# Patient Record
Sex: Female | Born: 1980 | Hispanic: Yes | Marital: Married | Smoking: Never smoker
Health system: Southern US, Community
[De-identification: ages and names within clinical notes are randomized; demographics above are authoritative.]

## PROBLEM LIST (undated history)

## (undated) ENCOUNTER — Inpatient Hospital Stay (HOSPITAL_COMMUNITY): Payer: Self-pay

## (undated) ENCOUNTER — Emergency Department (HOSPITAL_COMMUNITY): Admission: EM | Payer: Medicaid Other | Source: Home / Self Care

## (undated) DIAGNOSIS — E119 Type 2 diabetes mellitus without complications: Secondary | ICD-10-CM

## (undated) DIAGNOSIS — N200 Calculus of kidney: Secondary | ICD-10-CM

## (undated) DIAGNOSIS — N83209 Unspecified ovarian cyst, unspecified side: Secondary | ICD-10-CM

## (undated) DIAGNOSIS — O139 Gestational [pregnancy-induced] hypertension without significant proteinuria, unspecified trimester: Secondary | ICD-10-CM

## (undated) DIAGNOSIS — F1491 Cocaine use, unspecified, in remission: Secondary | ICD-10-CM

## (undated) DIAGNOSIS — Z87898 Personal history of other specified conditions: Secondary | ICD-10-CM

## (undated) DIAGNOSIS — K219 Gastro-esophageal reflux disease without esophagitis: Secondary | ICD-10-CM

## (undated) HISTORY — PX: APPENDECTOMY: SHX54

## (undated) HISTORY — PX: LITHOTRIPSY: SUR834

## (undated) HISTORY — PX: OTHER SURGICAL HISTORY: SHX169

---

## 2001-05-13 ENCOUNTER — Encounter: Payer: Self-pay | Admitting: *Deleted

## 2001-05-13 ENCOUNTER — Ambulatory Visit (HOSPITAL_COMMUNITY): Admission: RE | Admit: 2001-05-13 | Discharge: 2001-05-13 | Payer: Self-pay | Admitting: *Deleted

## 2001-06-21 ENCOUNTER — Observation Stay (HOSPITAL_COMMUNITY): Admission: AD | Admit: 2001-06-21 | Discharge: 2001-06-22 | Payer: Self-pay | Admitting: *Deleted

## 2001-06-30 ENCOUNTER — Observation Stay (HOSPITAL_COMMUNITY): Admission: RE | Admit: 2001-06-30 | Discharge: 2001-07-01 | Payer: Self-pay | Admitting: *Deleted

## 2001-07-01 ENCOUNTER — Observation Stay (HOSPITAL_COMMUNITY): Admission: AD | Admit: 2001-07-01 | Discharge: 2001-07-02 | Payer: Self-pay | Admitting: *Deleted

## 2001-07-13 ENCOUNTER — Ambulatory Visit (HOSPITAL_COMMUNITY): Admission: AD | Admit: 2001-07-13 | Discharge: 2001-07-13 | Payer: Self-pay | Admitting: Obstetrics and Gynecology

## 2001-07-24 ENCOUNTER — Inpatient Hospital Stay (HOSPITAL_COMMUNITY): Admission: RE | Admit: 2001-07-24 | Discharge: 2001-07-27 | Payer: Self-pay | Admitting: *Deleted

## 2001-08-06 ENCOUNTER — Observation Stay (HOSPITAL_COMMUNITY): Admission: AD | Admit: 2001-08-06 | Discharge: 2001-08-07 | Payer: Self-pay | Admitting: *Deleted

## 2001-08-13 ENCOUNTER — Ambulatory Visit (HOSPITAL_COMMUNITY): Admission: AD | Admit: 2001-08-13 | Discharge: 2001-08-13 | Payer: Self-pay | Admitting: Cardiology

## 2001-08-24 ENCOUNTER — Ambulatory Visit (HOSPITAL_COMMUNITY): Admission: RE | Admit: 2001-08-24 | Discharge: 2001-08-24 | Payer: Self-pay | Admitting: *Deleted

## 2001-08-24 ENCOUNTER — Observation Stay (HOSPITAL_COMMUNITY): Admission: AD | Admit: 2001-08-24 | Discharge: 2001-08-25 | Payer: Self-pay | Admitting: *Deleted

## 2001-08-26 ENCOUNTER — Observation Stay (HOSPITAL_COMMUNITY): Admission: AD | Admit: 2001-08-26 | Discharge: 2001-08-27 | Payer: Self-pay | Admitting: *Deleted

## 2001-08-31 ENCOUNTER — Ambulatory Visit (HOSPITAL_COMMUNITY): Admission: RE | Admit: 2001-08-31 | Discharge: 2001-08-31 | Payer: Self-pay | Admitting: *Deleted

## 2001-09-01 ENCOUNTER — Inpatient Hospital Stay (HOSPITAL_COMMUNITY): Admission: AD | Admit: 2001-09-01 | Discharge: 2001-09-04 | Payer: Self-pay | Admitting: *Deleted

## 2003-05-03 ENCOUNTER — Emergency Department (HOSPITAL_COMMUNITY): Admission: EM | Admit: 2003-05-03 | Discharge: 2003-05-03 | Payer: Self-pay | Admitting: *Deleted

## 2003-05-03 ENCOUNTER — Encounter: Payer: Self-pay | Admitting: *Deleted

## 2003-05-05 ENCOUNTER — Observation Stay (HOSPITAL_COMMUNITY): Admission: RE | Admit: 2003-05-05 | Discharge: 2003-05-06 | Payer: Self-pay | Admitting: Obstetrics & Gynecology

## 2004-08-21 ENCOUNTER — Emergency Department (HOSPITAL_COMMUNITY): Admission: EM | Admit: 2004-08-21 | Discharge: 2004-08-21 | Payer: Self-pay | Admitting: Emergency Medicine

## 2004-09-10 ENCOUNTER — Emergency Department (HOSPITAL_COMMUNITY): Admission: EM | Admit: 2004-09-10 | Discharge: 2004-09-11 | Payer: Self-pay | Admitting: Emergency Medicine

## 2005-01-28 ENCOUNTER — Emergency Department (HOSPITAL_COMMUNITY): Admission: EM | Admit: 2005-01-28 | Discharge: 2005-01-28 | Payer: Self-pay | Admitting: Emergency Medicine

## 2005-05-21 ENCOUNTER — Emergency Department (HOSPITAL_COMMUNITY): Admission: EM | Admit: 2005-05-21 | Discharge: 2005-05-21 | Payer: Self-pay | Admitting: Emergency Medicine

## 2005-07-08 ENCOUNTER — Emergency Department (HOSPITAL_COMMUNITY): Admission: EM | Admit: 2005-07-08 | Discharge: 2005-07-08 | Payer: Self-pay | Admitting: Emergency Medicine

## 2005-07-09 ENCOUNTER — Inpatient Hospital Stay (HOSPITAL_COMMUNITY): Admission: EM | Admit: 2005-07-09 | Discharge: 2005-07-14 | Payer: Self-pay | Admitting: Emergency Medicine

## 2005-07-20 ENCOUNTER — Inpatient Hospital Stay (HOSPITAL_COMMUNITY): Admission: EM | Admit: 2005-07-20 | Discharge: 2005-07-24 | Payer: Self-pay | Admitting: Emergency Medicine

## 2005-07-29 ENCOUNTER — Observation Stay (HOSPITAL_COMMUNITY): Admission: EM | Admit: 2005-07-29 | Discharge: 2005-07-30 | Payer: Self-pay | Admitting: *Deleted

## 2005-08-06 ENCOUNTER — Emergency Department (HOSPITAL_COMMUNITY): Admission: EM | Admit: 2005-08-06 | Discharge: 2005-08-06 | Payer: Self-pay | Admitting: *Deleted

## 2005-08-16 ENCOUNTER — Ambulatory Visit (HOSPITAL_COMMUNITY): Admission: RE | Admit: 2005-08-16 | Discharge: 2005-08-16 | Payer: Self-pay | Admitting: Obstetrics and Gynecology

## 2005-09-08 ENCOUNTER — Observation Stay (HOSPITAL_COMMUNITY): Admission: AD | Admit: 2005-09-08 | Discharge: 2005-09-09 | Payer: Self-pay | Admitting: Obstetrics and Gynecology

## 2005-09-16 ENCOUNTER — Observation Stay (HOSPITAL_COMMUNITY): Admission: AD | Admit: 2005-09-16 | Discharge: 2005-09-17 | Payer: Self-pay | Admitting: Obstetrics and Gynecology

## 2005-09-28 ENCOUNTER — Ambulatory Visit (HOSPITAL_COMMUNITY): Admission: AD | Admit: 2005-09-28 | Discharge: 2005-09-28 | Payer: Self-pay | Admitting: Obstetrics and Gynecology

## 2005-10-14 ENCOUNTER — Observation Stay (HOSPITAL_COMMUNITY): Admission: AD | Admit: 2005-10-14 | Discharge: 2005-10-14 | Payer: Self-pay | Admitting: Obstetrics and Gynecology

## 2005-10-20 ENCOUNTER — Observation Stay (HOSPITAL_COMMUNITY): Admission: AD | Admit: 2005-10-20 | Discharge: 2005-10-22 | Payer: Self-pay | Admitting: Obstetrics and Gynecology

## 2005-10-23 ENCOUNTER — Inpatient Hospital Stay (HOSPITAL_COMMUNITY): Admission: RE | Admit: 2005-10-23 | Discharge: 2005-10-26 | Payer: Self-pay | Admitting: Obstetrics and Gynecology

## 2005-10-30 ENCOUNTER — Observation Stay (HOSPITAL_COMMUNITY): Admission: AD | Admit: 2005-10-30 | Discharge: 2005-10-31 | Payer: Self-pay | Admitting: Obstetrics and Gynecology

## 2005-10-31 ENCOUNTER — Ambulatory Visit (HOSPITAL_COMMUNITY): Admission: AD | Admit: 2005-10-31 | Discharge: 2005-10-31 | Payer: Self-pay | Admitting: Obstetrics and Gynecology

## 2005-11-05 ENCOUNTER — Ambulatory Visit (HOSPITAL_COMMUNITY): Admission: AD | Admit: 2005-11-05 | Discharge: 2005-11-05 | Payer: Self-pay | Admitting: Obstetrics and Gynecology

## 2005-11-12 ENCOUNTER — Encounter (HOSPITAL_COMMUNITY): Admission: RE | Admit: 2005-11-12 | Discharge: 2005-12-12 | Payer: Self-pay | Admitting: Obstetrics and Gynecology

## 2005-11-12 ENCOUNTER — Ambulatory Visit (HOSPITAL_COMMUNITY): Payer: Self-pay | Admitting: Obstetrics and Gynecology

## 2005-11-18 ENCOUNTER — Observation Stay (HOSPITAL_COMMUNITY): Admission: AD | Admit: 2005-11-18 | Discharge: 2005-11-19 | Payer: Self-pay | Admitting: Obstetrics and Gynecology

## 2005-12-04 ENCOUNTER — Observation Stay (HOSPITAL_COMMUNITY): Admission: AD | Admit: 2005-12-04 | Discharge: 2005-12-05 | Payer: Self-pay | Admitting: Obstetrics and Gynecology

## 2005-12-19 ENCOUNTER — Inpatient Hospital Stay (HOSPITAL_COMMUNITY): Admission: AD | Admit: 2005-12-19 | Discharge: 2005-12-20 | Payer: Self-pay | Admitting: Obstetrics and Gynecology

## 2005-12-20 ENCOUNTER — Observation Stay (HOSPITAL_COMMUNITY): Admission: RE | Admit: 2005-12-20 | Discharge: 2005-12-21 | Payer: Self-pay | Admitting: Obstetrics and Gynecology

## 2005-12-24 ENCOUNTER — Observation Stay (HOSPITAL_COMMUNITY): Admission: RE | Admit: 2005-12-24 | Discharge: 2005-12-24 | Payer: Self-pay | Admitting: Obstetrics and Gynecology

## 2005-12-26 ENCOUNTER — Ambulatory Visit (HOSPITAL_COMMUNITY): Admission: AD | Admit: 2005-12-26 | Discharge: 2005-12-26 | Payer: Self-pay | Admitting: Obstetrics and Gynecology

## 2006-01-02 ENCOUNTER — Ambulatory Visit (HOSPITAL_COMMUNITY): Admission: AD | Admit: 2006-01-02 | Discharge: 2006-01-02 | Payer: Self-pay | Admitting: Obstetrics and Gynecology

## 2006-01-10 ENCOUNTER — Inpatient Hospital Stay (HOSPITAL_COMMUNITY): Admission: RE | Admit: 2006-01-10 | Discharge: 2006-01-11 | Payer: Self-pay | Admitting: Obstetrics and Gynecology

## 2006-04-07 ENCOUNTER — Emergency Department (HOSPITAL_COMMUNITY): Admission: EM | Admit: 2006-04-07 | Discharge: 2006-04-07 | Payer: Self-pay | Admitting: Emergency Medicine

## 2006-06-28 ENCOUNTER — Emergency Department (HOSPITAL_COMMUNITY): Admission: EM | Admit: 2006-06-28 | Discharge: 2006-06-28 | Payer: Self-pay | Admitting: Emergency Medicine

## 2006-07-08 ENCOUNTER — Emergency Department (HOSPITAL_COMMUNITY): Admission: EM | Admit: 2006-07-08 | Discharge: 2006-07-08 | Payer: Self-pay | Admitting: Emergency Medicine

## 2006-09-30 ENCOUNTER — Emergency Department (HOSPITAL_COMMUNITY): Admission: EM | Admit: 2006-09-30 | Discharge: 2006-09-30 | Payer: Self-pay | Admitting: Emergency Medicine

## 2006-10-06 ENCOUNTER — Emergency Department (HOSPITAL_COMMUNITY): Admission: EM | Admit: 2006-10-06 | Discharge: 2006-10-07 | Payer: Self-pay | Admitting: Emergency Medicine

## 2006-11-19 ENCOUNTER — Emergency Department (HOSPITAL_COMMUNITY): Admission: EM | Admit: 2006-11-19 | Discharge: 2006-11-19 | Payer: Self-pay | Admitting: Emergency Medicine

## 2006-12-16 ENCOUNTER — Emergency Department (HOSPITAL_COMMUNITY): Admission: EM | Admit: 2006-12-16 | Discharge: 2006-12-16 | Payer: Self-pay | Admitting: Emergency Medicine

## 2007-07-06 ENCOUNTER — Emergency Department (HOSPITAL_COMMUNITY): Admission: EM | Admit: 2007-07-06 | Discharge: 2007-07-07 | Payer: Self-pay | Admitting: Emergency Medicine

## 2007-10-09 ENCOUNTER — Emergency Department (HOSPITAL_COMMUNITY): Admission: EM | Admit: 2007-10-09 | Discharge: 2007-10-10 | Payer: Self-pay | Admitting: Emergency Medicine

## 2007-10-29 ENCOUNTER — Ambulatory Visit (HOSPITAL_COMMUNITY): Admission: RE | Admit: 2007-10-29 | Discharge: 2007-10-29 | Payer: Self-pay | Admitting: Family Medicine

## 2007-11-03 ENCOUNTER — Emergency Department (HOSPITAL_COMMUNITY): Admission: EM | Admit: 2007-11-03 | Discharge: 2007-11-03 | Payer: Self-pay | Admitting: Emergency Medicine

## 2008-03-09 ENCOUNTER — Emergency Department (HOSPITAL_COMMUNITY): Admission: EM | Admit: 2008-03-09 | Discharge: 2008-03-09 | Payer: Self-pay | Admitting: Emergency Medicine

## 2008-05-18 ENCOUNTER — Emergency Department (HOSPITAL_COMMUNITY): Admission: EM | Admit: 2008-05-18 | Discharge: 2008-05-19 | Payer: Self-pay | Admitting: Emergency Medicine

## 2008-07-12 ENCOUNTER — Emergency Department (HOSPITAL_COMMUNITY): Admission: EM | Admit: 2008-07-12 | Discharge: 2008-07-13 | Payer: Self-pay | Admitting: Emergency Medicine

## 2008-11-09 ENCOUNTER — Emergency Department (HOSPITAL_COMMUNITY): Admission: EM | Admit: 2008-11-09 | Discharge: 2008-11-10 | Payer: Self-pay | Admitting: Emergency Medicine

## 2009-06-29 ENCOUNTER — Emergency Department (HOSPITAL_COMMUNITY): Admission: EM | Admit: 2009-06-29 | Discharge: 2009-06-29 | Payer: Self-pay | Admitting: Emergency Medicine

## 2009-08-07 ENCOUNTER — Emergency Department (HOSPITAL_COMMUNITY): Admission: EM | Admit: 2009-08-07 | Discharge: 2009-08-07 | Payer: Self-pay | Admitting: Emergency Medicine

## 2009-10-19 ENCOUNTER — Emergency Department (HOSPITAL_COMMUNITY): Admission: EM | Admit: 2009-10-19 | Discharge: 2009-10-19 | Payer: Self-pay | Admitting: Emergency Medicine

## 2010-03-12 ENCOUNTER — Emergency Department (HOSPITAL_COMMUNITY): Admission: EM | Admit: 2010-03-12 | Discharge: 2010-03-12 | Payer: Self-pay | Admitting: Emergency Medicine

## 2010-03-22 ENCOUNTER — Emergency Department (HOSPITAL_COMMUNITY): Admission: EM | Admit: 2010-03-22 | Discharge: 2010-03-22 | Payer: Self-pay | Admitting: Emergency Medicine

## 2010-06-04 ENCOUNTER — Emergency Department (HOSPITAL_COMMUNITY): Admission: EM | Admit: 2010-06-04 | Discharge: 2010-06-04 | Payer: Self-pay | Admitting: Emergency Medicine

## 2010-10-04 ENCOUNTER — Emergency Department (HOSPITAL_COMMUNITY): Admission: EM | Admit: 2010-10-04 | Discharge: 2010-10-04 | Payer: Self-pay | Admitting: Emergency Medicine

## 2010-10-25 ENCOUNTER — Emergency Department (HOSPITAL_COMMUNITY): Admission: EM | Admit: 2010-10-25 | Discharge: 2010-10-26 | Payer: Self-pay | Admitting: Emergency Medicine

## 2011-01-08 ENCOUNTER — Emergency Department (HOSPITAL_COMMUNITY)
Admission: EM | Admit: 2011-01-08 | Discharge: 2011-01-09 | Payer: Self-pay | Source: Home / Self Care | Admitting: Emergency Medicine

## 2011-01-09 LAB — URINALYSIS, ROUTINE W REFLEX MICROSCOPIC
Bilirubin Urine: NEGATIVE
Ketones, ur: NEGATIVE mg/dL
Leukocytes, UA: NEGATIVE
Nitrite: NEGATIVE
Protein, ur: NEGATIVE mg/dL
Specific Gravity, Urine: 1.025 (ref 1.005–1.030)
Urine Glucose, Fasting: NEGATIVE mg/dL
Urobilinogen, UA: 0.2 mg/dL (ref 0.0–1.0)
pH: 6 (ref 5.0–8.0)

## 2011-01-09 LAB — DIFFERENTIAL
Basophils Absolute: 0 10*3/uL (ref 0.0–0.1)
Basophils Relative: 0 % (ref 0–1)
Eosinophils Absolute: 0.1 10*3/uL (ref 0.0–0.7)
Eosinophils Relative: 2 % (ref 0–5)
Lymphocytes Relative: 32 % (ref 12–46)
Lymphs Abs: 2.4 10*3/uL (ref 0.7–4.0)
Monocytes Absolute: 0.6 10*3/uL (ref 0.1–1.0)
Monocytes Relative: 9 % (ref 3–12)
Neutro Abs: 4.2 10*3/uL (ref 1.7–7.7)
Neutrophils Relative %: 57 % (ref 43–77)

## 2011-01-09 LAB — CBC
HCT: 33.9 % — ABNORMAL LOW (ref 36.0–46.0)
Hemoglobin: 11.6 g/dL — ABNORMAL LOW (ref 12.0–15.0)
MCH: 28.9 pg (ref 26.0–34.0)
MCHC: 34.2 g/dL (ref 30.0–36.0)
MCV: 84.5 fL (ref 78.0–100.0)
Platelets: 251 10*3/uL (ref 150–400)
RBC: 4.01 MIL/uL (ref 3.87–5.11)
RDW: 12.6 % (ref 11.5–15.5)
WBC: 7.4 10*3/uL (ref 4.0–10.5)

## 2011-01-09 LAB — BASIC METABOLIC PANEL
BUN: 6 mg/dL (ref 6–23)
CO2: 26 mEq/L (ref 19–32)
Calcium: 8.8 mg/dL (ref 8.4–10.5)
Chloride: 106 mEq/L (ref 96–112)
Creatinine, Ser: 0.66 mg/dL (ref 0.4–1.2)
GFR calc Af Amer: >60 mL/min (ref >60)
GFR calc non Af Amer: >60 mL/min (ref >60)
Glucose, Bld: 96 mg/dL (ref 70–99)
Potassium: 4 mEq/L (ref 3.5–5.1)
Sodium: 138 mEq/L (ref 135–145)

## 2011-01-09 LAB — URINE MICROSCOPIC-ADD ON

## 2011-01-09 LAB — PREGNANCY, URINE: Preg Test, Ur: NEGATIVE

## 2011-01-13 ENCOUNTER — Encounter: Payer: Self-pay | Admitting: Family Medicine

## 2011-03-05 LAB — CBC
HCT: 36 % (ref 36.0–46.0)
MCHC: 33.9 g/dL (ref 30.0–36.0)
RDW: 13.7 % (ref 11.5–15.5)
WBC: 8.5 10*3/uL (ref 4.0–10.5)

## 2011-03-05 LAB — URINE MICROSCOPIC-ADD ON

## 2011-03-05 LAB — URINALYSIS, ROUTINE W REFLEX MICROSCOPIC
Glucose, UA: NEGATIVE mg/dL
Ketones, ur: NEGATIVE mg/dL
Protein, ur: NEGATIVE mg/dL
pH: 6.5 (ref 5.0–8.0)

## 2011-03-05 LAB — DIFFERENTIAL
Basophils Absolute: 0 10*3/uL (ref 0.0–0.1)
Basophils Relative: 0 % (ref 0–1)
Lymphocytes Relative: 29 % (ref 12–46)
Monocytes Absolute: 0.5 10*3/uL (ref 0.1–1.0)
Neutro Abs: 5.4 10*3/uL (ref 1.7–7.7)
Neutrophils Relative %: 64 % (ref 43–77)

## 2011-03-05 LAB — POCT PREGNANCY, URINE: Preg Test, Ur: NEGATIVE

## 2011-03-05 LAB — BASIC METABOLIC PANEL
BUN: 7 mg/dL (ref 6–23)
Calcium: 9.2 mg/dL (ref 8.4–10.5)
GFR calc non Af Amer: >60 mL/min (ref >60)
Glucose, Bld: 118 mg/dL — ABNORMAL HIGH (ref 70–99)
Potassium: 3.7 mEq/L (ref 3.5–5.1)
Sodium: 136 mEq/L (ref 135–145)

## 2011-03-06 LAB — CBC
HCT: 34.8 % — ABNORMAL LOW (ref 36.0–46.0)
Hemoglobin: 11.9 g/dL — ABNORMAL LOW (ref 12.0–15.0)
MCHC: 34.2 g/dL (ref 30.0–36.0)
RBC: 4 MIL/uL (ref 3.87–5.11)
WBC: 5.8 10*3/uL (ref 4.0–10.5)

## 2011-03-06 LAB — DIFFERENTIAL
Basophils Relative: 0 % (ref 0–1)
Lymphocytes Relative: 34 % (ref 12–46)
Lymphs Abs: 2 10*3/uL (ref 0.7–4.0)
Monocytes Absolute: 0.5 10*3/uL (ref 0.1–1.0)
Monocytes Relative: 8 % (ref 3–12)
Neutro Abs: 3.2 10*3/uL (ref 1.7–7.7)
Neutrophils Relative %: 56 % (ref 43–77)

## 2011-03-06 LAB — BASIC METABOLIC PANEL
Calcium: 9 mg/dL (ref 8.4–10.5)
GFR calc Af Amer: >60 mL/min (ref >60)
GFR calc non Af Amer: >60 mL/min (ref >60)
Potassium: 3.8 mEq/L (ref 3.5–5.1)
Sodium: 137 mEq/L (ref 135–145)

## 2011-03-06 LAB — POCT CARDIAC MARKERS
CKMB, poc: <1 ng/mL — ABNORMAL LOW (ref 1.0–8.0)
Myoglobin, poc: 25.5 ng/mL (ref 12–200)
Troponin i, poc: <0.05 ng/mL (ref 0.00–0.09)

## 2011-03-06 LAB — POCT PREGNANCY, URINE: Preg Test, Ur: NEGATIVE

## 2011-03-15 LAB — URINE MICROSCOPIC-ADD ON

## 2011-03-15 LAB — URINALYSIS, ROUTINE W REFLEX MICROSCOPIC
Bilirubin Urine: NEGATIVE
Ketones, ur: NEGATIVE mg/dL
Leukocytes, UA: NEGATIVE
Nitrite: NEGATIVE
Protein, ur: NEGATIVE mg/dL
Protein, ur: NEGATIVE mg/dL
Specific Gravity, Urine: 1.02 (ref 1.005–1.030)
Urobilinogen, UA: 0.2 mg/dL (ref 0.0–1.0)
pH: 7 (ref 5.0–8.0)

## 2011-03-15 LAB — GC/CHLAMYDIA PROBE AMP, GENITAL
Chlamydia, DNA Probe: NEGATIVE
GC Probe Amp, Genital: NEGATIVE

## 2011-03-15 LAB — PREGNANCY, URINE: Preg Test, Ur: NEGATIVE

## 2011-03-15 LAB — HCG, QUANTITATIVE, PREGNANCY: hCG, Beta Chain, Quant, S: <2 m[IU]/mL (ref <5)

## 2011-03-15 LAB — WET PREP, GENITAL

## 2011-03-28 LAB — URINALYSIS, ROUTINE W REFLEX MICROSCOPIC
Bilirubin Urine: NEGATIVE
Nitrite: NEGATIVE
Specific Gravity, Urine: 1.015 (ref 1.005–1.030)
Urobilinogen, UA: 0.2 mg/dL (ref 0.0–1.0)

## 2011-03-28 LAB — URINE CULTURE

## 2011-03-28 LAB — PREGNANCY, URINE: Preg Test, Ur: NEGATIVE

## 2011-03-28 LAB — URINE MICROSCOPIC-ADD ON

## 2011-03-30 LAB — DIFFERENTIAL
Basophils Absolute: 0 10*3/uL (ref 0.0–0.1)
Basophils Relative: 0 % (ref 0–1)
Eosinophils Relative: 2 % (ref 0–5)
Lymphocytes Relative: 25 % (ref 12–46)
Neutro Abs: 3.9 10*3/uL (ref 1.7–7.7)

## 2011-03-30 LAB — URINALYSIS, ROUTINE W REFLEX MICROSCOPIC
Bilirubin Urine: NEGATIVE
Protein, ur: NEGATIVE mg/dL
Urobilinogen, UA: 0.2 mg/dL (ref 0.0–1.0)

## 2011-03-30 LAB — COMPREHENSIVE METABOLIC PANEL
AST: 31 U/L (ref 0–37)
BUN: 5 mg/dL — ABNORMAL LOW (ref 6–23)
CO2: 27 mEq/L (ref 19–32)
Chloride: 105 mEq/L (ref 96–112)
Creatinine, Ser: 0.61 mg/dL (ref 0.4–1.2)
GFR calc non Af Amer: >60 mL/min (ref >60)
Total Bilirubin: 0.3 mg/dL (ref 0.3–1.2)

## 2011-03-30 LAB — CBC
HCT: 33.7 % — ABNORMAL LOW (ref 36.0–46.0)
MCV: 90.3 fL (ref 78.0–100.0)
RBC: 3.73 MIL/uL — ABNORMAL LOW (ref 3.87–5.11)
WBC: 6 10*3/uL (ref 4.0–10.5)

## 2011-03-30 LAB — LIPASE, BLOOD: Lipase: 28 U/L (ref 11–59)

## 2011-03-31 LAB — BASIC METABOLIC PANEL
CO2: 27 mEq/L (ref 19–32)
Chloride: 99 mEq/L (ref 96–112)
Creatinine, Ser: 0.73 mg/dL (ref 0.4–1.2)
GFR calc Af Amer: >60 mL/min (ref >60)
Potassium: 3.5 mEq/L (ref 3.5–5.1)
Sodium: 133 mEq/L — ABNORMAL LOW (ref 135–145)

## 2011-03-31 LAB — DIFFERENTIAL
Basophils Relative: 0 % (ref 0–1)
Eosinophils Absolute: 0.1 10*3/uL (ref 0.0–0.7)
Eosinophils Relative: 2 % (ref 0–5)
Lymphs Abs: 1.1 10*3/uL (ref 0.7–4.0)
Monocytes Absolute: 0.4 10*3/uL (ref 0.1–1.0)
Monocytes Relative: 5 % (ref 3–12)

## 2011-03-31 LAB — CBC
HCT: 34.7 % — ABNORMAL LOW (ref 36.0–46.0)
Hemoglobin: 12.5 g/dL (ref 12.0–15.0)
MCHC: 36 g/dL (ref 30.0–36.0)
MCV: 90 fL (ref 78.0–100.0)
RBC: 3.86 MIL/uL — ABNORMAL LOW (ref 3.87–5.11)
WBC: 7.4 10*3/uL (ref 4.0–10.5)

## 2011-03-31 LAB — URINALYSIS, ROUTINE W REFLEX MICROSCOPIC
Ketones, ur: NEGATIVE mg/dL
Protein, ur: NEGATIVE mg/dL
Urobilinogen, UA: 0.2 mg/dL (ref 0.0–1.0)

## 2011-03-31 LAB — URINE MICROSCOPIC-ADD ON

## 2011-03-31 LAB — URINE CULTURE

## 2011-05-10 NOTE — Op Note (Signed)
NAME:  [name], [name]             ACCOUNT NO.:  [id number]   MEDICAL RECORD NO.:  [id number]          PATIENT TYPE:  INP   LOCATION:  A418                          FACILITY:  [facility]   PHYSICIAN:  Ky Barban, M.D.DATE OF BIRTH:  [birth year]   DATE OF PROCEDURE:  07/12/2005  DATE OF DISCHARGE:                                 OPERATIVE REPORT   PREOPERATIVE DIAGNOSES:  1.  Left hydronephrosis.  2.  Left flank pain.  3.  Pregnancy.   POSTOPERATIVE DIAGNOSES:  1.  Left hydronephrosis.  2.  Left flank pain.  3.  Pregnancy.   PROCEDURE:  Cystoscopy, left double-J stent insertion under fluoroscopic  control.   ANESTHESIA:  Spinal.   FINDINGS:  The hydronephrosis on the left side is probably only grade 1.  It  was worse on previous ultrasound.  She is still having pain but I doubt if  the pain is coming from the kidney unless she passed a stone.  I went ahead  and inserted a double-J stent.   PROCEDURE IN DETAIL:  The patient had spinal anesthesia, placed in lithotomy  position, and the abdomen was prepped and draped.  A #25 cystoscope was  introduced into the bladder.  The left ureteral orifice was catheterized  with an open-ended catheter and through that, a guide wire was passed up  into the renal pelvis and it could be seen inside the renal pelvis on the  ultrasound.  Then, over the guide wire, I advanced the open-ended catheter  into the renal pelvis and guide wire was removed.  Hydronephrotic grip was  obtained.  The guide wire was re-inserted.  Open-ended catheter was removed  over the guide wire.  A 5-French, 24 cm double-J stent is positioned and the  upper end of the stent can be seen with the ultrasound.  The lower end is  visible as a loop in the bladder.  All the instruments are removed.  The  patient left the operating room in satisfactory condition.       MIJ/MEDQ  D:  07/12/2005  T:  07/12/2005  Job:  [id number]

## 2011-05-10 NOTE — Discharge Summary (Signed)
NAME:  AURIE, HARROUN             ACCOUNT NO.:  [id number]   MEDICAL RECORD NO.:  [id number]          PATIENT TYPE:  INP   LOCATION:  LDR2                          FACILITY:  [facility]   PHYSICIAN:  Lazaro Arms, M.D.   DATE OF BIRTH:  September 14, 1981   DATE OF ADMISSION:  12/19/2005  DATE OF DISCHARGE:  12/29/2006LH                                 DISCHARGE SUMMARY   Tamsen had an uneventful night. She is still showing contractions,  irregularly, sometimes frequently on the ESM, which is no change for the  past 3 months.  Fetal heart rate looks fine. There is no change in her  cervix in over 24 hours it is about 4 cm thick, posterior, minus 1 station.   In lieu of the fact that she has made no cervical change in over 24 hours,  really about 30 hours, we are going to send her home with instructions to  come back if her contractions change in character, or ruptured membranes, or  anything else that gives her concern.      Jacklyn Shell, C.N.M.      Lazaro Arms, M.D.  Electronically Signed    FC/MEDQ  D:  12/20/2005  T:  12/20/2005  Job:  [id number]

## 2011-05-10 NOTE — Op Note (Signed)
NAME:  [name], [name] NO.:  [id number]   MEDICAL RECORD NO.:  [id number]          PATIENT TYPE:  INP   LOCATION:  LDR4                          FACILITY:  [facility]   PHYSICIAN:  Lazaro Arms, M.D.   DATE OF BIRTH:  23-Mar-1981   DATE OF PROCEDURE:  01/10/2006  DATE OF DISCHARGE:                                 OPERATIVE REPORT   [name] is a 30 year old gravida 2, para 1 requesting epidural to be  placed. She is placed in a sitting position. Betadine prep is performed. One  percent lidocaine is injected in the L3-L4 interspace. The area is field  draped. A 17-gauge Tuohy needle is used and loss-of-resistance technique  employed, and the epidural space is found with one pass without difficulty.  Ten cc of 0.125% bupivacaine plain is given into the epidural space as test  dose. There is no ill effects. The epidural catheter is then fed and taped  down 5 cm into the epidural space. An additional 10 cc of 0.125% bupivacaine  plain is given to dose up the epidural with continuous infusion of 0.125%  bupivacaine with 2 mcg/cc of fentanyl is begun at 12 cc an hour. The patient  is getting good pain relief. The fetal heart rate tracing is stable, and the  blood pressure is stable.      Lazaro Arms, M.D.  Electronically Signed     LHE/MEDQ  D:  01/10/2006  T:  01/10/2006  Job:  [id number]

## 2011-05-10 NOTE — Group Therapy Note (Signed)
NAME:  [name], [name]             ACCOUNT NO.:  [id number]   MEDICAL RECORD NO.:  [id number]          PATIENT TYPE:  OIB   LOCATION:  A415                          FACILITY:  [facility]   PHYSICIAN:  Tilda Burrow, M.D. DATE OF BIRTH:  Apr 25, 1981   DATE OF PROCEDURE:  08/16/2005  DATE OF DISCHARGE:                                   PROGRESS NOTE   SUBJECTIVE:  [name] came in about 5:30 a.m. with complaints of nausea and  vomiting.  She arrived on the unit with an emesis basin in her hand with  some emesis in it.  I gave her 25 mg of Phenergan IM to which she responded  well.  Fetal heart rate was confirmed.  Tocolysis was placed and it appears  that she is trying to induce a contraction pattern on the monitor by tensing  up her stomach.  There are no palpable contractions.  Urine was negative.  She does have some Phenergan p.o. and PR at home which I advised to try  first before coming to the hospital if possible.   ASSESSMENT/PLAN:  She is discharged home in happy and stable condition.      Jacklyn Shell, C.N.M.      Tilda Burrow, M.D.  Electronically Signed    FC/MEDQ  D:  08/16/2005  T:  08/16/2005  Job:  [id number]   cc:   [facility]

## 2011-05-10 NOTE — H&P (Signed)
NAME:  CHARIS, JULIANA             ACCOUNT NO.:  [id number]   MEDICAL RECORD NO.:  [id number]          PATIENT TYPE:  INP   LOCATION:  LDR2                          FACILITY:  [facility]   PHYSICIAN:  Tilda Burrow, M.D. DATE OF BIRTH:  03/20/1981   DATE OF ADMISSION:  12/18/2005  DATE OF DISCHARGE:  LH                                HISTORY & PHYSICAL   REASON FOR ADMISSION:  Pregnancy at 35-3/7 weeks in early labor.   HISTORY OF PRESENT ILLNESS:  [name] came in about 12:30 a.m.  At that  time, she was 3 cm.  She has now progressed to 4 cm and having regular  uterine contractions.   PAST MEDICAL HISTORY:  Negative.   PAST SURGICAL HISTORY:  1.  Appendectomy.  2.  Renal stent.  3.  Removal of the stent.   ALLERGIES:  No known drug allergies.   MEDICATIONS:  1.  Iron.  2.  Vitamins.   SOCIAL HISTORY:  She is married.   PRENATAL COURSE:  Prenatal course has been complicated by right  hydronephrosis with placement of stent and removal of stent.   PRENATAL LABORATORY DATA:  Blood type O positive.  UDS is negative.  Hepatitis B surface antigen immune.  HIV nonreactive.  Serology nonreactive.  Pap smear are normal.  GC and Chlamydia are negative.  AFP normal.  HSV  negative.  Serologies on repeat negative.  A 28-week hemoglobin is 8.8.  A  28-week hematocrit 29.4.  One-hour glucose was 192.  Three-hour was not  gotten to due to patient noncompliance.   PHYSICAL EXAMINATION:  VITAL SIGNS:  Stable.  Cervix is now 4 cm.  Fetal  heart rate is stable with accelerations.   PLAN:  Admit and expect vaginal delivery.      Zerita Boers, Lanier Clam      Tilda Burrow, M.D.  Electronically Signed    DL/MEDQ  D:  04/54/0981  T:  12/19/2005  Job:  [id number]   cc:   [facility]   Francoise Schaumann. Milford Cage DO, FAAP  Fax: [phone]

## 2011-05-10 NOTE — Consult Note (Signed)
NAME:  [name], [name]             ACCOUNT NO.:  [id number]   MEDICAL RECORD NO.:  [id number]          PATIENT TYPE:  OIB   LOCATION:  A415                          FACILITY:  [facility]   PHYSICIAN:  Tilda Burrow, M.D. DATE OF BIRTH:  [birth year]   DATE OF CONSULTATION:  DATE OF DISCHARGE:  10/31/2005                                   CONSULTATION   [name] came back to labor and delivery at approximately 1800 after being  sent home that morning with complaints of increased pressure, contractions,  and some spotting. Vaginal exam revealed her cervix unchanged from all  previous exams which was about 1 cm long, presenting part about -2 station.  She was having her contractions about three minutes on the monitor. She told  me her last p.o. dose of terbutaline was at 1500, told the nurse her last  p.o. dose was right before she came, and I am not really sure why there is a  discrepancy in what she is claiming her dosing schedule is, but anyhow, she  received one dose of subcu terbutaline, and she responded well to that. We  will be starting weekly injections of 17-hydroxyprogesterone on her starting  on Friday, November 10. Instructions were given to [name] about how to do  this. Fetal heart rate was fine while she was monitored. She was discharged  home at approximately 8:00.      Jacklyn Shell, C.N.M.      Tilda Burrow, M.D.  Electronically Signed    FC/MEDQ  D:  11/01/2005  T:  11/01/2005  Job:  [id number]

## 2011-05-10 NOTE — Consult Note (Signed)
NAME:  [name], [name]             ACCOUNT NO.:  [id number]   MEDICAL RECORD NO.:  [id number]          PATIENT TYPE:  OBV   LOCATION:  LDR2                          FACILITY:  [facility]   PHYSICIAN:  Tilda Burrow, M.D. DATE OF BIRTH:  1981/04/04   DATE OF CONSULTATION:  DATE OF DISCHARGE:  09/09/2005                                   CONSULTATION   OBSERVATION NOTE:  Observation x12 hours.   HOSPITAL SUMMARY:  This 30 year old female at [redacted] weeks gestation was  presented for right-sided abdominal pain.  She had a history of  hydronephrosis on the right and a right ureteral stent placement which was  then subsequently removed August 06, 2005.  She complains of right-sided  pain, had a cath urinalysis which was negative for blood after a clean  voided sample showed hematuria.  Renal ultrasound was performed which showed  mild hydronephrosis on the right, may be perceived to be physiologic for  late pregnancy, but present a bit earlier than usual.  She was able to keep  p.o. analgesics and p.o. food down and was discharged in the afternoon after  the ultrasound was performed on the following medicines.   MEDICINES:  1.  Phenergan 25 mg p.o. q.6 h. p.r.n. nausea or pain.  2.  Vicodin ES one p.o. q.6 h. p.r.n. pain dispense #30.   FOLLOW UP:  Our office one week.      Tilda Burrow, M.D.  Electronically Signed     JVF/MEDQ  D:  09/09/2005  T:  09/09/2005  Job:  [id number]

## 2011-05-10 NOTE — Discharge Summary (Signed)
NAME:  [name], [name]             ACCOUNT NO.:  [id number]   MEDICAL RECORD NO.:  [id number]          PATIENT TYPE:  OIB   LOCATION:  LDR2                          FACILITY:  [facility]   PHYSICIAN:  Langley Gauss, MD     DATE OF BIRTH:  14-Sep-1981   DATE OF ADMISSION:  12/20/2005  DATE OF DISCHARGE:  12/30/2006LH                                 DISCHARGE SUMMARY   OBSERVATION NOTE:  Observation period extended from December 20, 2005 to  December 21, 2005.  Evaluation with discharge by Dr. Roylene Reason. Lisette Grinder on  December 21, 2005.   DIAGNOSES:  1.  Multiparous patient 35-week intrauterine pregnancy with abdominal pain.  2.  False labor.   HISTORY:  Patient received prenatal care through [facility]  Prenatal care has been complicated by multiple previous visits and  admissions for preterm labor.  Patient has been seen several times by [facility] during this past week at which time she was noted to be 4 cm dilated.  She now presents the late evening of December 20, 2005 with complaints of  uterine contractions.   OBSTETRICAL HISTORY:  OB history is pertinent for two prior vaginal  deliveries.   PHYSICAL EXAMINATION:  VITALS:  BP 133/79, pulse of 98, temperature 98.6,  respiratory rate is 20.  HEENT:  Negative.  No adenopathy.  Neck is supple.  Thyroid is nonpalpable.  LUNGS:  Clear.  CARDIOVASCULAR EXAM:  Regular rate and rhythm.  ABDOMEN:  Gravid uterus identified with a fundal height of 35 cm.  Vertex  presentation by Leopold's maneuvers.  EXTREMITIES:  Extremities noted to be normal.  CERVIX:  Cervix is on initial presentation 4 cm dilated, thick, -2  presenting station with intact membranes.  External fetal monitor reveals  contraction frequency initially about every 2 minutes about 60 seconds'  duration described as mild.   ASSESSMENT AND PLAN:  Patient continued on observation status to evaluate  for onset of active labor.  She was treated with 2 g of IV ampicillin  due to  her high risk for group B strep carrier status at [redacted] weeks gestation.  Patient observed throughout the evening.  She did receive IV fluids as well  as Nubain and Phenergan for therapeutic rest.  Throughout the evening the  frequency and intensity of uterine contractions diminished.  She was  reexamined by the  same examiner the morning of December 21, 2005 at which time she was noted  to have made no cervical change.  Signs and symptoms of labor as well as  spontaneous rupture of membranes reviewed with the patient who is now  discharged to home with disposition to follow up with [facility].      Langley Gauss, MD  Electronically Signed     DC/MEDQ  D:  12/21/2005  T:  12/22/2005  Job:  [id number]   cc:   [facility]

## 2011-05-10 NOTE — H&P (Signed)
NAME:  [name], [name]             ACCOUNT NO.:  [id number]   MEDICAL RECORD NO.:  [id number]          PATIENT TYPE:  INP   LOCATION:  A413                          FACILITY:  [facility]   PHYSICIAN:  Tilda Burrow, M.D. DATE OF BIRTH:  [birth year]   DATE OF ADMISSION:  01/10/2006  DATE OF DISCHARGE:  LH                                HISTORY & PHYSICAL   ADMISSION DIAGNOSIS:  Pregnancy 38-1/[redacted] weeks gestation, elective induction  of labor.   HISTORY OF PRESENT ILLNESS:  This 30 year old female gravida IV, para II, AB  1 is admitted this time for induction of labor at the patient's strong  request after pregnancy which has been notable for at least a dozen  assessments in Labor and Delivery for preterm labor, pelvic pain, etc.  First trimester was marked by pelvic discomfort, felt to be related to  hydroureter due to pelvic pressure from the uterus which was resulting in  distention of the right ureter.  She had a ureteral stent placed by Dr.  Jerre Simon in early August removed August 15 by Dr. Jerre Simon.  She was seen in the  emergency room intermittently during the first trimester.  Recurrent pelvic  pressure symptoms persisted during pregnancy.  She had a negative fetal  fibronectin during November.  She was receiving 17 Hydroxyprogesterone 150  mg IM injections weekly x3.  Subsequently, she was seen multiple times,  always having mild uterine contractions, but never changing the cervix.  Late pregnancy has been notable for cervical dilation which, at the present  time, is felt to be 3 cm, soft, long posterior with presenting part at 0  station.  This pregnancy has been significantly worse in terms of pelvic  discomfort compared to prior pregnancy.   PAST MEDICAL HISTORY:  Benign.   PAST SURGICAL HISTORY:  Appendectomy, right renal stent placement and  removal of stent this pregnancy.   ALLERGIES:  NO KNOWN DRUG ALLERGIES.   MEDICATIONS:  1.  Prenatal vitamins with iron.  2.  Vicodin,  p.r.n. pain.   PHYSICAL EXAMINATION:  GENERAL APPEARANCE:  A somber appearing, generally  healthy Hispanic female.  HEENT:  Pupils equal, round and reactive.  NECK:  Supple.  CARDIOVASCULAR:  Unremarkable.  ABDOMEN:  A term sized fetus, vertex presentation, 37 cm fundal height,  vertex 0 station, cervix 3 cm, soft, long, posterior.   LABORATORY DATA:  Group B strep negative.  Blood type O positive, rubella  immune at present, hemoglobin 12, hematocrit 37, hepatitis/HIV, RPR,  GC/Chlamydia all negative.   PLAN:  Elective induction January 09, 2005.  Admission delayed til 01/10/2006  due to busy L & D      Tilda Burrow, M.D.  Electronically Signed     JVF/MEDQ  D:  01/05/2006  T:  01/06/2006  Job:  [id number]

## 2011-05-10 NOTE — Discharge Summary (Signed)
NAME:  [name], [name]             ACCOUNT NO.:  [id number]   MEDICAL RECORD NO.:  [id number]          PATIENT TYPE:  INP   LOCATION:  A428                          FACILITY:  [facility]   PHYSICIAN:  Tilda Burrow, M.D. DATE OF BIRTH:  November 12, 1981   DATE OF ADMISSION:  07/29/2005  DATE OF DISCHARGE:  08/08/2006LH                                 DISCHARGE SUMMARY   ADMISSION DIAGNOSIS:  Pregnancy with hydronephrosis stent, nausea, vomiting  and abdominal pain.   HOSPITAL COURSE:  [name] is a 30 year old patient who presented to the  emergency room with nausea and vomiting and abdominal pain on August 7.  She  was admitted per Dr. Lisette Grinder and started on IV Rocephin, antiemetics and IV  pain management.  She has now progressed to p.o. pain management and we feel  that she is ready for discharge.  After discussing the plan of care with Dr.  Emelda Fear, he is in agreement with patient's plan of care after reviewing her  chart.   DISCHARGE MEDICATIONS:  1.  Macrobid b.i.d. x10 days; the one p.o. daily.  2.  Phenergan 25 mg suppositories one per rectum q.4-6h. p.r.n. nausea.  3.  Lortab 7.5/500 for discomfort q.4h.   FOLLOW UP:  She is to follow up with Dr. Emelda Fear in the office on Friday in  regards to followup.  The urine culture is pending.       DL/MEDQ  D:  16/09/9603  T:  07/30/2005  Job:  [id number]   cc:   [facility]

## 2011-05-10 NOTE — H&P (Signed)
NAME:  [name], [name]             ACCOUNT NO.:  [id number]   MEDICAL RECORD NO.:  [id number]          PATIENT TYPE:  INP   LOCATION:  A415                          FACILITY:  [facility]   PHYSICIAN:  Tilda Burrow, M.D. DATE OF BIRTH:  1981/12/08   DATE OF ADMISSION:  10/23/2005  DATE OF DISCHARGE:  LH                                HISTORY & PHYSICAL   ADMISSION DIAGNOSES:  1.  Pregnancy [redacted] weeks gestation with recurrent preterm labor.  2.  Negative fetal fibronectin.   HISTORY OF PRESENT ILLNESS:  This 30 year old female, gravida 2, para 1, AB  0, LMP unknown with 20 week ultrasound finding EDC of January 16, 2006, 15-  1/2 week ultrasound suggesting [facility] January 19, 2006.  By the second criteria  she is 27+ weeks gestation.  She presents today less than 24 hours after her  most recent discharge on oral terbutaline therapy.  She has had multiple  visits for recurrent uterine irritability.  Fetal fibronectin was done  yesterday prior to discharge and is negative.  Nonetheless, she has the most  impressive contractions to date and indeed, internal examination during the  time of her q.3 minute contractions shows distinct tightening of the uterus  and lower uterine segment.  Presenting part is vertex.  She is admitted for  magnesium sulfate therapy and consideration of progesterone supplement as  well as consideration of converting to Indocin therapy after magnesium  sulfate.   PAST MEDICAL HISTORY:  Benign.  Limited records available.   LABORATORY DATA:  Prenatal labs include blood type O positive.  Urine drug  screen negative.  RUBELLA IMMUNITY NEGATIVE.  Hemoglobin 12. Hematocrit 37.  HIV, RPR, Hepatitis, all negative.  Pap smear class I.  Gonorrhea and  Chlamydia negative.  MS AFP normal.   PHYSICAL EXAMINATION:  GENERAL APPEARANCE:  General exam shows a healthy-  appearing female similar in appearance to prior visits.  She is alert,  oriented  X3.  ABDOMEN:  Examination shows  gravid uterus, nontender between contractions  with palpable contractions.  The patient is uncomfortable and tightens her  abdomen which makes the external monitor show a distinctly atypical  contraction contour with dramatic increase in tightness, converting the  contraction appearance to a box shape but there is indeed a contraction  beneath it, made more dramatic by abdominal wall tightening.  PELVIC:  Cervix is very soft, posteriorly oriented, uneffaced with 1 to 2 cm  diameter.  There is no expulsion of the mucous plug.  There is no fluid per  vagina and she is negative Nitrazine and fern this A.M.   PLAN:  Additionally, she received betamethasone 12 mg IM first show  yesterday.  Plans are to restart magnesium sulfate, give consideration to  adding the 17 hydroxyprogesterone therapy and give consideration to Indocin  treatment.      Tilda Burrow, M.D.  Electronically Signed     JVF/MEDQ  D:  10/23/2005  T:  10/23/2005  Job:  [id number]   cc:   [facility]

## 2011-05-10 NOTE — Discharge Summary (Signed)
NAME:  [name], [name]             ACCOUNT NO.:  [id number]   MEDICAL RECORD NO.:  [id number]          PATIENT TYPE:  INP   LOCATION:  A428                          FACILITY:  [facility]   PHYSICIAN:  Lazaro Arms, M.D.   DATE OF BIRTH:  January 26, 1981   DATE OF ADMISSION:  07/20/2005  DATE OF DISCHARGE:  08/02/2006LH                                 DISCHARGE SUMMARY   DISCHARGE DIAGNOSES:  1.  Intrauterine pregnancy at 13-1/[redacted] weeks gestation.  2.  Questionable left ureteral calculus.  3.  Status post insertion in the past of a left J stent into the ureter and      renal pelvis.   HOSPITAL COURSE:  The patient was admitted with ongoing left lower quadrant  pain.  She had evaluation with ultrasound that showed the left JJ ureteral  catheter in place.  She had no hydronephrosis and no evidence of a calculus  at that time.  She was evaluated by Urology, and they felt like the stent  was appropriate.  The patient was kept on Macrobid for prophylaxis, and  after a couple of days in the hospital, her pain improved, although it was  not gone.  Certainly there was no evidence of any ureteral issues as well as  pregnancy issues.  She was discharged to home on Lorcet Plus and Macrobid.      Lazaro Arms, M.D.  Electronically Signed     LHE/MEDQ  D:  09/25/2005  T:  09/25/2005  Job:  [id number]

## 2011-05-10 NOTE — Discharge Summary (Signed)
NAME:  [name], [name]             ACCOUNT NO.:  [id number]   MEDICAL RECORD NO.:  [id number]          PATIENT TYPE:  OIB   LOCATION:  A415                          FACILITY:  [facility]   PHYSICIAN:  Tilda Burrow, M.D. DATE OF BIRTH:  [birth year]   DATE OF ADMISSION:  10/30/2005  DATE OF DISCHARGE:  LH                                 DISCHARGE SUMMARY   REASON FOR ADMISSION:  Pregnancy. EDC is January 28. Pregnancy at 28 weeks  with spotting and abdominal pain.   HISTORY OF PRESENT ILLNESS:  [name] is a 30 year old gravida 3, para 2  admitted to observation at approximately 2 a.m. for abdominal pain and  spotting.   PAST MEDICAL HISTORY:  Positive for hydronephrosis in this pregnancy.   PAST SURGICAL HISTORY:  Positive for stent placement and stent removal.   SUMMARY:  Fetal heart rate pattern is stable. The patient was treated with  subcu Brethine and pain management with resolution of contractions. Cervix  at 8:00 is now unchanged from last exam, and the patient is to be discharged  home to follow up in the office next week.      Zerita Boers, Lanier Clam      Tilda Burrow, M.D.  Electronically Signed    DL/MEDQ  D:  81/19/1478  T:  10/31/2005  Job:  [id number]   cc:   [facility]

## 2011-05-10 NOTE — Consult Note (Signed)
NAME:  BURNIS, KASER             ACCOUNT NO.:  [id number]   MEDICAL RECORD NO.:  [id number]          PATIENT TYPE:  INP   LOCATION:  LDR4                          FACILITY:  [facility]   PHYSICIAN:  Tilda Burrow, M.D. DATE OF BIRTH:  1981-03-31   DATE OF CONSULTATION:  01/10/2006  DATE OF DISCHARGE:                                   CONSULTATION   HISTORY OF PRESENT ILLNESS:  Shortly after receiving her epidural bolus of  2% Xylocaine for a hot spot in her right lower quadrant, Taisa complained  of an urge to push.  The hot spot was relieved, and she was noted to be  fully dilated at +2 station.  Ayeza pushed for a good 30 minutes, and  probably because of her epidural, just had trouble bringing her down.  Finally, Cynda started vomiting, and she literally delivered the baby  while vomiting.  The right hand was compound and the hand was grasped and  delivered first.  After that, the shoulders came easily.  Weight is 7 pounds  1 ounce.  Apgars are 9 and 9.  Pitocin 20 units diluted in 1000 cc of  lactated Ringers was infused rapidly IV.  The placenta separated  spontaneously and delivered via controlled cord traction and maternal  pushing effort at 1210.  It was inspected and appears to be intact with a  three vessel cord.  Lochia was extremely minimal.  The fundus was firm.  The  epidural catheter was removed with the blue tip visualized as being intact.  Estimated blood loss 100 cc.  The vagina was inspected, and no lacerations  were found.      Jacklyn Shell, C.N.M.      Tilda Burrow, M.D.  Electronically Signed    FC/MEDQ  D:  01/10/2006  T:  01/10/2006  Job:  [id number]   cc:   Francoise Schaumann. Milford Cage DO, FAAP  Fax: [phone]

## 2011-05-10 NOTE — Discharge Summary (Signed)
NAME:  [name], [name]             ACCOUNT NO.:  [id number]   MEDICAL RECORD NO.:  [id number]          PATIENT TYPE:  OIB   LOCATION:  A415                          FACILITY:  [facility]   PHYSICIAN:  Tilda Burrow, M.D. DATE OF BIRTH:  Aug 19, 1981   DATE OF ADMISSION:  12/04/2005  DATE OF DISCHARGE:  12/14/2006LH                                 DISCHARGE SUMMARY   HISTORY OF PRESENT ILLNESS:  [name] is at 32 weeks' gestation coming in  with discomfort and complaints of uterine contractions.  Upon exam, there  are no previous changes in her cervix from prior exams.   OBSERVATION COURSE:  The patient was given Ambien during the course of the  night and rested well at intervals.  At approximately 7:30 a.m. this  morning, cervix was checked.  There has been no change since admission.  She  failed to keep her 3-hour GTT appointment at the office, so we are now going  to place her NPO and start her 3-hour GTT at noon.  After the 3-hour GTT,  she is to be discharged and follow up with Dr. Emelda Fear on Friday in the  a.m.      Zerita Boers, Lanier Clam      Tilda Burrow, M.D.  Electronically Signed    DL/MEDQ  D:  16/09/9603  T:  12/05/2005  Job:  [id number]

## 2011-05-10 NOTE — Consult Note (Signed)
NAME:  [name], [name]             ACCOUNT NO.:  [id number]   MEDICAL RECORD NO.:  [id number]          PATIENT TYPE:  OIB   LOCATION:                                FACILITY:  [facility]   PHYSICIAN:  Tilda Burrow, M.D. DATE OF BIRTH:  Nov 07, 1981   DATE OF CONSULTATION:  DATE OF DISCHARGE:  12/26/2005                                   CONSULTATION   [name] came to labor and delivery yesterday.  She is now 36.[redacted] weeks  gestation.  Same complaint as always.  Contractions every two minutes.  She  says they might be a little worse this time.  She was monitored for several  hours.  Her examination is unchanged from her examinations that she has had  in the past week which is about 4-5 cm, still thick, posterior.  Fetal heart  rate was reactive.  She was discharged home after about two hours of  monitoring.      Jacklyn Shell, C.N.M.      Tilda Burrow, M.D.  Electronically Signed    FC/MEDQ  D:  12/27/2005  T:  12/27/2005  Job:  [id number]

## 2011-05-10 NOTE — H&P (Signed)
NAME:  [name], [name]             ACCOUNT NO.:  [id number]   MEDICAL RECORD NO.:  [id number]          PATIENT TYPE:  OIB   LOCATION:  A415                          FACILITY:  [facility]   PHYSICIAN:  Tilda Burrow, M.D. DATE OF BIRTH:  05/08/1981   DATE OF ADMISSION:  10/14/2005  DATE OF DISCHARGE:  LH                                HISTORY & PHYSICAL   HISTORY OF PRESENT ILLNESS:  [name] is a 30 year old gravida 4, para 3 who  was admitted with lower abdominal pain and cramping.  Vaginal exam at the  time of admission was fingertip, posterior, thick, and high.  Examination  this morning at 7:50 a.m. is the same.   PAST MEDICAL HISTORY:  Hydronephrosis.   PAST SURGICAL HISTORY:  Stent placement and removal.   PHYSICAL EXAMINATION:  VITAL SIGNS:  This morning, vital signs are stable.  PELVIC:  Cervix is unchanged.  There is no contraction pattern palpated or  noted on the monitor.   PLAN:  1.  We are going to get a CBC and UA.  If those are normal, discharge her      home.  2.  Follow up Wednesday in the office for assessment.  At that time, I plan      to do a fetal fibronectin.      [name], [name]      Tilda Burrow, M.D.  Electronically Signed    DL/MEDQ  D:  16/09/9603  T:  10/14/2005  Job:  [id number]

## 2011-05-10 NOTE — Group Therapy Note (Signed)
NAME:  [name], [name]             ACCOUNT NO.:  [id number]   MEDICAL RECORD NO.:  [id number]          PATIENT TYPE:  OIB   LOCATION:  A415                          FACILITY:  [facility]   PHYSICIAN:  Tilda Burrow, M.D. DATE OF BIRTH:  Mar 23, 1981   DATE OF PROCEDURE:  09/16/2005  DATE OF DISCHARGE:  09/17/2005                                   PROGRESS NOTE   REASON FOR ADMISSION:  Pregnancy at 25 weeks with low abdominal pain and  pressure.   PAST MEDICAL HISTORY:  Right hydronephrosis __stent placement_____ and stent  removal due to the patient's discomfort.   PAST SURGICAL HISTORY:  Stent placement.   ALLERGIES:  No known drug allergies.   PRENATAL COURSE:  Complicated by right hydronephrosis and migraines during  this pregnancy.  Blood type O positive, hepatitis B immune, HIV nonreactive,  serology nonreactive.  Pap class I.  GC/Chlamydia negative.  AFP is normal.   PHYSICAL EXAMINATION:  Vital signs are stable.  Cervix is closed.  No  uterine contractions noted.  The patient had therapeutic rest for  approximately 6 hours.  Pain has now resolved, and the patient is ready for  discharge.   PLAN:  She is to be discharged home.  Follow up Friday with one of the  physicians.      Zerita Boers, Lanier Clam      Tilda Burrow, M.D.  Electronically Signed    DL/MEDQ  D:  91/47/8295  T:  09/17/2005  Job:  [id number]

## 2011-05-10 NOTE — Group Therapy Note (Signed)
NAME:  [name], [name]             ACCOUNT NO.:  [id number]   MEDICAL RECORD NO.:  [id number]          PATIENT TYPE:  OIB   LOCATION:  A415                          FACILITY:  [facility]   PHYSICIAN:  Langley Gauss, MD     DATE OF BIRTH:  [birth year]   DATE OF PROCEDURE:  DATE OF DISCHARGE:                                   PROGRESS NOTE   OBSERVATION NOTE:  Observation status initiated on October 20, 2005.  The  patient is a 30 year old gravida 4, para 2 at 27-plus weeks gestation who  actually presented to Labor and Delivery complaining of her undergarments  being wet x 4 today.  This occurred only upon standing, did not occur at  bedrest.  She denies any fluid running down her leg.  Denies any vaginal  bleeding and initially did not have any complaints of uterine cramping.  Patient's prenatal course has been complicated by multiple previous  evaluations on the Labor and Delivery floor, threatened preterm labor,  uterine contractions, kidney pain and had previously had a ureteral stent  placed.  She has two prior vaginal deliveries without complications and no  known drug allergies.   PHYSICAL EXAMINATION:  GENERAL:  No acute distress.  VITAL SIGNS:  Blood pressure is 105/70, pulse rate 80, respiratory rate is  20.  HEENT:  Negative.  No adenopathy.  NECK:  Supple.  Thyroid is not palpable.  LUNGS:  Clear.  CARDIOVASCULAR:  Regular rate and rhythm.  ABDOMEN:  Soft and nontender.  Gravid uterus identified, consistent with 27  weeks.  Presenting part is indeterminate.  External fetal monitor reveals  reassuring fetal heart rate.  There is uterine activity identified with very  atypical appearance in that it occurs about every 3 to 4 minutes, lasts  about one minute duration each, but it is very flat along the top.   ASSESSMENT AND PLAN:  Patient initiated on observation status with diagnosis  of preterm labor.  She was treated with subcu terbutaline protocol which  failed to have any  relief of the uterine-type activity or patient's  discomfort.  Subsequently, she received IV fluids and sedation with Nubain  and Phenergan.  This also failed to have any relief.  With the continuing  uterine activity apparent, patient was started on  magnesium sulfate  protocol, given 6-gm loading dose followed by 2 gm per hour.  The patient  was examined with sterile speculum examination.  There was noted to be no  pooling of amniotic fluid.  She has a normal-appearing leukorrhea.  A slide  is performed which is negative for fern.  Digital examination revealed  multiparous external os.  Internal os is closed and very high.  The  presenting part is not engaged.  Patient with unknown group B strep status.  She is treated empirically with Unasyn 1 gm IV q.6h.  She is having atypical-  appearing uterine activity which is more consistent with abdominal muscle  tightening.  Nevertheless, it is not resulting in any cervical change and  the presenting part is not engaged.  Thus, at this point in time, patient  is  continued on the IV magnesium sulfate and, given therapeutic risk, with 10  mg of p.o. Ambien.      Langley Gauss, MD  Electronically Signed     DC/MEDQ  D:  10/20/2005  T:  10/20/2005  Job:  [id number]

## 2011-05-10 NOTE — H&P (Signed)
   NAME:  [name], [name]                       ACCOUNT NO.:  [id number]   MEDICAL RECORD NO.:  [id number]                   PATIENT TYPE:  AMB   LOCATION:  DAY                                  FACILITY:  [facility]   PHYSICIAN:  Lazaro Arms, M.D.                DATE OF BIRTH:  August 04, 1981   DATE OF ADMISSION:  05/05/2003  DATE OF DISCHARGE:                                HISTORY & PHYSICAL   HISTORY OF PRESENT ILLNESS:  Forde Dandy is a 30 year old Hispanic female,  gravida 3, para 2, abortus 1, with a last menstrual period of March 11, 2003, giving an estimated date of delivery of December 15, 2003, who has  been having right lower quadrant pain for the last two weeks and vaginal  spotting.  She was seen in the ER for intense right lower quadrant pain two  days ago, and felt that she had an ectopic pregnancy.  She was seen in our  office today, and she has severe right lower quadrant pain, free fluid in  the cul-de-sac, and a 3 x 4 cm mass in the right adnexa consistent with a  ruptured right ectopic pregnancy.  She walks in.  She has a stable blood  pressure of 120/75.  As a result, she is admitted for an operative  laparoscopy with removal of ectopic.   PAST MEDICAL HISTORY:  Negative.   PAST SURGICAL HISTORY:  Negative.   PAST OBSTETRICAL HISTORY:  She has had two vaginal deliveries.   ALLERGIES:  No known drug allergies.   MEDICATIONS:  None.   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   PHYSICAL EXAMINATION:  VITAL SIGNS:  Her weight is 136 pounds, blood  pressure is 120/75.  HEENT:  Unremarkable.  NECK:  Thyroid is normal.  LUNGS:  Clear.  HEART:  Regular rate and rhythm without murmurs, rubs, or gallops.  BREASTS:  Deferred.  ABDOMEN:  Positive rebound and voluntary guarding.  PELVIC:  Deferred.  EXTREMITIES:  Warm with no edema.  NEUROLOGIC:  Grossly intact.    IMPRESSION:  Ruptured right ectopic pregnancy.   PLAN:   Operative laparoscopy with removal of ectopic pregnancy.                                               Lazaro Arms, M.D.    Loraine Maple  D:  05/05/2003  T:  05/05/2003  Job:  [id number]

## 2011-05-10 NOTE — Discharge Summary (Signed)
NAME:  [name], [name]             ACCOUNT NO.:  [id number]   MEDICAL RECORD NO.:  [id number]          PATIENT TYPE:  INP   LOCATION:  A415                          FACILITY:  [facility]   PHYSICIAN:  Tilda Burrow, M.D. DATE OF BIRTH:  10-Nov-1981   DATE OF ADMISSION:  10/23/2005  DATE OF DISCHARGE:  11/04/2006LH                                 DISCHARGE SUMMARY   ADMISSION DIAGNOSES:  1.  Pregnancy at 27 weeks' gestation.  2.  Recurrent preterm labor.  3.  Pelvic pain.  4.  Atypical uterine contraction pattern.  5.  Negative fetal fibronectin.   DISCHARGE DIAGNOSES:  1.  Pregnancy at 27-1/2 weeks' gestation.  2.  Recurrent preterm labor, not delivered, resolved.  3.  Negative fetal fibronectin.   PROCEDURE:  Magnesium sulfate therapy x36 hours.   HISTORY OF PRESENT ILLNESS:  This 30 year old female was admitted for repeat  episode of recurrent uterine irritability.  Fetal fibronectin was done last  week and has returned negative.  She has complaints of pelvic discomfort.  The pregnancy has been notable of very low pain tolerance and extensive  perception and discomfort.  She is admitted for magnesium sulfate therapy.  Hemoglobin was 8.5 and hematocrit 25.2, white count 14,000 with 86  neutrophils.   HOSPITAL COURSE:  The patient was admitted and was afebrile.  Blood pressure  was 129/69, pulse 96, respirations 20.  She had good urinary output.  She  was afebrile.  She had the usual contraction pattern that she noticed with  perceived contractions that would cause discomfort in the patient's abdomen.  We kept her on magnesium sulfate initially ordered upon arrival.  She was  kept on this a total of 48 hours.  On October 25, 2005, she was taken off  the magnesium sulfate.  She did not receive antibiotics for the first 24  hours.  The antibiotics were added.  She remained afebrile.  She had fetal  heart rate remaining in the normal range and the uterus was nontender.  She  had  almost complete resolution of the contractions, but persisted with mild  contractions at two per hour.  On  October 26, 2005, cervix remained soft, posterior long, closed, multiparous  and unusually soft for the duration of the pregnancy.  Again, fetal  fibronectin had been negative recently.  She was discharged home for light  activity around the house with followup in 1 week in our office on November 04, 2005.      Tilda Burrow, M.D.  Electronically Signed     JVF/MEDQ  D:  11/04/2005  T:  11/05/2005  Job:  [id number]

## 2011-05-10 NOTE — Discharge Summary (Signed)
NAME:  [name], [name]             ACCOUNT NO.:  [id number]   MEDICAL RECORD NO.:  [id number]          PATIENT TYPE:  OIB   LOCATION:  LDR2                          FACILITY:  [facility]   PHYSICIAN:  Tilda Burrow, M.D. DATE OF BIRTH:  April 15, 1981   DATE OF ADMISSION:  01/02/2006  DATE OF DISCHARGE:  01/11/2007LH                                 DISCHARGE SUMMARY   HISTORY OF PRESENT ILLNESS:  [name] came to labor and delivery on the  evening of January 02, 2006, with complaints of uterine contractions.  She  is now about 37 weeks.  Her UA was negative.  Fetal heart rate looks good.  She said she was having some difficulty breathing through her contractions  at this time and that is what brought her in.   Her cervical exam is essentially unchanged to be about 5 cm, thick and  posterior.  Fetal heart rate was reactive showing contractions every 2  minutes of 60 seconds duration, square in character.   HOSPITAL COURSE:  She was observed x2 hours and cervix was again examined  with no change noted.  She was discharged to home in stable condition.      Jacklyn Shell, C.N.M.      Tilda Burrow, M.D.  Electronically Signed    FC/MEDQ  D:  01/03/2006  T:  01/03/2006  Job:  [id number]   cc:   [facility]

## 2011-05-10 NOTE — Consult Note (Signed)
NAME:  [name], [name]             ACCOUNT NO.:  [id number]   MEDICAL RECORD NO.:  [id number]           PATIENT TYPE:   LOCATION:                                 FACILITY:   PHYSICIAN:  Kasen Adduci                     DATE OF BIRTH:   DATE OF CONSULTATION:  DATE OF DISCHARGE:                                   CONSULTATION   [name] came to labor and delivery with complaints of rule out labor.  She  also said that her panties were wet x1 after her examination in the office  today.  Amnio swab was negative.  She walked around for two hours with a pad  on.  She had absolutely no leakage of fluid whatsoever.  Vaginal examination  revealed a cervix of 4.5, 50%, posterior which is the same examination since  last week and since I saw her in the office earlier that morning.  Fetal  heart rate is reactive.  No reason whatsoever to think that she has ruptured  membranes.  It was probably just the K-Y jelly that had been used at the  office a few hours prior to her coming.      Jacklyn Shell, C.N.M.    ______________________________  ZOXWRUE    FC/MEDQ  D:  12/27/2005  T:  12/27/2005  Job:  [id number]

## 2011-05-10 NOTE — H&P (Signed)
NAME:  [name], [name]             ACCOUNT NO.:  [id number]   MEDICAL RECORD NO.:  [id number]          PATIENT TYPE:  OIB   LOCATION:  A415                          FACILITY:  [facility]   PHYSICIAN:  Tilda Burrow, M.D. DATE OF BIRTH:  1981-05-13   DATE OF ADMISSION:  11/18/2005  DATE OF DISCHARGE:  LH                                HISTORY & PHYSICAL   REASON FOR ADMISSION:  Pregnancy at 31 weeks with nausea, vomiting and lower  abdominal pain.   HISTORY OF PRESENT ILLNESS:  [name] was seen in the office yesterday and  evaluated by Dr. Despina Hidden and discharged to home.  The patient came in at  approximately 12 a.m. this morning.   PRENATAL COURSE:  Prenatal history was complicated by hydronephrosis with  placement removal of stents.   PRENATAL LABORATORY DATA:  Blood type O positive.  UDS negative.  Hepatitis  B surface antigen is negative.  HIV nonreactive.  Serology nonreactive.  Pap  smear is normal.  GC and Chlamydia are negative.  AFP within normal limits.   PAST MEDICAL HISTORY:  Negative.   PAST SURGICAL HISTORY:  1.  Appendectomy.  2.  Ureteral stents from hydronephrosis from pregnancy.  3.  Removal of stents.   ALLERGIES:  No known drug allergies.   FAMILY HISTORY:  Positive for diabetes and cancer.   PHYSICAL EXAMINATION:  VITAL SIGNS:  Stable.  PELVIC:  Cervix is unchanged.   IMPRESSION:  After 8 hours of observation, it is noted that while the  patient is awake the monitor traces blocking-type contractions about every 3-  4 minutes, but when the patient is asleep there are no contractions noted.  There are no contractions palpated.   PLAN:  Admit to observation and let Dr. Despina Hidden evaluate.      Zerita Boers, Lanier Clam      Tilda Burrow, M.D.  Electronically Signed    DL/MEDQ  D:  84/13/2440  T:  11/19/2005  Job:  [id number]   cc:   [facility]

## 2011-05-10 NOTE — Op Note (Signed)
NAME:  [name], [name]                       ACCOUNT NO.:  [id number]   MEDICAL RECORD NO.:  [id number]                   PATIENT TYPE:  INP   LOCATION:  A303                                 FACILITY:  [facility]   PHYSICIAN:  Lazaro Arms, M.D.                DATE OF BIRTH:  [birth year]   DATE OF PROCEDURE:  05/05/2003  DATE OF DISCHARGE:                                 OPERATIVE REPORT   PREOPERATIVE DIAGNOSIS:  Ruptured right ectopic pregnancy.   POSTOPERATIVE DIAGNOSES:  1. Appendicitis.  2. Viable versus nonviable pregnancy.  3. Intrauterine versus extrauterine pregnancy.   PROCEDURE:  Operative laparoscopy with a laparoscopic appendectomy.   SURGEON:  Lazaro Arms, M.D.   ANESTHESIA:  General endotracheal.   FINDINGS:  Preoperatively the patient presented with an acute abdomen to the  office.  She had been seen in the ER two days prior and was felt to have an  ectopic pregnancy.  She had a quantitative HCG of 165.  The last menstrual  period was March 19.  She came into the office today with rebound tenderness  and voluntary guarding.  She had been afebrile.  Vaginal probe sonogram  revealed free fluid in the pelvis and a right ovarian cyst, and the  presumption was a right ectopic pregnancy because there was not enough fluid  and the cyst was not significant enough to be appropriate for the amount of  pain unless, of course, it was an ovarian torsion.  As a result the patient  was taken for an operative laparoscopy.   FINDINGS:  At the time of surgery there was no ovarian torsion.  There was  no blood in the pelvis.  The left and right tubes were normal.  There was no  evidence of an ectopic pregnancy or a pregnancy that had been aborted out  the end of the tube.  There was no evidence of an abdominal pregnancy,  either.  Her appendix, however, was injected and stiff and as a result, I  felt this was the source of her pain and did a laparoscopic appendectomy.   DESCRIPTION OF PROCEDURE:  The patient was taken to the operating room and  placed in the supine position, where she underwent general endotracheal  anesthesia.  She was then placed in the dorsal lithotomy position.  The  vagina was prepped in the usual sterile fashion.  A Foley catheter was  placed, and a Hulka tenaculum was placed for uterine manipulation.  The  abdomen was then prepped and draped in the usual sterile fashion.  An  incision was made in the umbilicus.  A non-bladed obturator was used and  placed in the peritoneal cavity under direct visualization without  difficulty.  The peritoneal cavity was insufflated using CO2 gas.  Three  additional trocars were placed, all under direct visualization, and all were  bladed, two fingerbreadths above the pubis in the right  lower quadrant, and  a 12 mm trocar in the left lower quadrant.  The above-noted findings were  seen.  The pelvis was irrigated vigorously.  There was no evidence of an  aborted pregnancy or an ectopic pregnancy.  Pictures were taken.  The  appendix did appear to be stiff and injected and I felt it was probably the  source of her pain, and as a result I performed a laparoscopic appendectomy.  I used the Harmonic scalpel to take down the mesentery and the appendiceal  artery.  I then used the Endo-GIA and clamped across the appendix at the  base, and I had a small amount of bleeding at the corner and used the  Harmonic scalpel to control this without difficulty.  There was good  hemostasis.  Vigorous irrigation was performed in this area, and there was  no spillage.  I put the appendix immediately into the EndoCatch and removed  it from the peritoneal cavity, and again there was no contamination.  Again  the pelvis was irrigated vigorously, pictures were taken.  The instruments  were removed from the peritoneal cavity under direct visualization.  The  fascia in the left lower quadrant and umbilical incisions were  closed.  The  skin was closed using skin staples.  Marcaine 0.5% with 1:200,000  epinephrine was injected in the subcu for postoperative anesthesia.  The  patient was awakened from anesthesia and taken to the recovery room in good,  stable condition.  All counts were correct.  The specimen was sent to  pathology for evaluation.                                               Lazaro Arms, M.D.    Loraine Maple  D:  05/05/2003  T:  05/06/2003  Job:  [id number]

## 2011-05-10 NOTE — Discharge Summary (Signed)
NAME:  [name], [name]             ACCOUNT NO.:  [id number]   MEDICAL RECORD NO.:  [id number]          PATIENT TYPE:  OIB   LOCATION:  A415                          FACILITY:  [facility]   PHYSICIAN:  Tilda Burrow, M.D. DATE OF BIRTH:  1981/06/29   DATE OF ADMISSION:  11/05/2005  DATE OF DISCHARGE:  11/14/2006LH                                 DISCHARGE SUMMARY   HOSPITAL COURSE:  [name] is now 29-2/7 weeks and came into the hospital  with complaints of preterm contractions.  Sure enough, she has her same old  pattern that she always has, contractions that are square every 3 minutes  that are lasting 60 seconds.  She did not respond at all to one dose of  subcutaneous terbutaline.  Her cervix is unchanged from all her previous  exams.  She was checked yesterday by Dr. Emelda Fear.  I discussed this with  Dr. Emelda Fear about how to handle her.  She did not come to her appointment  on Friday to begin her 17-hydroxyprogesterone injections.  She told me that  she was at her grandmother's house and never got any messages about when to  come, however, she told the R.N. she did not know a thing about it, so there  is something going on there, I am not sure.  Nonetheless, we did start her  injections today and she has an appointment in the specialty clinics every  Tuesday at 11 a.m. for the next 5 weeks to receive these injections.  [name] verbalizes an understanding of this as well as her significant  other.  Because her cervix was examined today, we could not repeat the fetal  fibronectin.  It has been 2 weeks since she had her last one.  She has an  appointment in the office tomorrow to repeat the fetal fibronectin.  She was  discharged home today in stable condition with instruction to return to the  clinic if something about the contractions changes.      Jacklyn Shell, C.N.M.      Tilda Burrow, M.D.  Electronically Signed    FC/MEDQ  D:  11/05/2005  T:  11/05/2005   Job:  [id number]

## 2011-05-10 NOTE — H&P (Signed)
[facility]  Patient:    [name], [name] Visit Number: [id number] MRN: [id number]          Service Type: Attending:  Langley Gauss, M.D. Dictated by:   Langley Gauss, M.D. Adm. Date:  09/01/01                           History and Physical  HISTORY OF PRESENT ILLNESS:  A 30 year old gravida 2, para 1 at 55 to 37 weeks intrauterine pregnancy who presents to [facility] the p.m. of September 01, 2001, having uterine contractions of increasing frequency and intensity. The patient on initial examination was noted to be 4 cm dilated. She was observed for several hours at which time she was noted to have cervical change to 6 cm dilatation. The patients prenatal course was complicated by multiple previous admissions for preterm labor, threatened preterm labor, back pain and abdominal pain. Notably the patient was treated aggressively with tocolytics. Likewise at [redacted] weeks gestation, she was treated with 2 doses of IM betamethasone to enhance the lung maturity.  PAST MEDICAL HISTORY:  She has no known drug allergies.  OB HISTORY:  Vaginal delivery of a 6 pound 4 ounce infant on January 24, 1997, delivered over a midline episiotomy utilizing epidural analgesic.  PRENATAL COURSE:  Complicated by very early prenatal care and, as stated previously, had multiple previous evaluations in the office and hospitalizations for premature cervical dilatation and his suggestive of preterm labor.  PHYSICAL EXAMINATION:  GENERAL:  [place] female in no acute distress.  VITAL SIGNS:  Blood pressure 134/75, pulse rate of 111, respiratory rate 22, temperature 98.7.  HEENT:  Negative. No adenopathy.  NECK:  Supple, thyroid is nonpalpable.  LUNGS:  Clear.  CARDIOVASCULAR:  Regular rate and rhythm.  ABDOMEN:  Soft and nontender. No surgical scars are identified. Vertex presentation by [employer].  EXTREMITIES:  Found to be normal.  PELVIC:  Normal  external genitalia. No lesions or ulcerations were noted. No bleeding. No leakage of fluid. Cervix 6 cm dilated. External fetal monitor reveals a reassuring fetal heart rate with uterine contractions occurring q. 3-7 minutes moderate intensity.  ASSESSMENT:  A 36-37 week intrauterine pregnancy with premature cervical dilatation now presents in preterm labor. The patient will be treated with IV ampicillin during the course of the labor secondary to high risk of group B strep colonization in both the mother and passage to the infant. After amniotomy, the patient will be augmented with Pitocin if clinically indicated. The patient does request epidural analgesic. Dictated by:   Langley Gauss, M.D. Attending:  Langley Gauss, M.D. DD:  09/30/01 TD:  09/30/01 Job: [id number]

## 2011-08-15 ENCOUNTER — Emergency Department (HOSPITAL_COMMUNITY)
Admission: EM | Admit: 2011-08-15 | Discharge: 2011-08-15 | Disposition: A | Discharge status: Home / Self Care | Payer: Self-pay | Attending: Emergency Medicine | Admitting: Emergency Medicine

## 2011-08-15 ENCOUNTER — Encounter: Payer: Self-pay | Admitting: Emergency Medicine

## 2011-08-15 DIAGNOSIS — Z7982 Long term (current) use of aspirin: Secondary | ICD-10-CM | POA: Insufficient documentation

## 2011-08-15 DIAGNOSIS — R112 Nausea with vomiting, unspecified: Secondary | ICD-10-CM | POA: Insufficient documentation

## 2011-08-15 DIAGNOSIS — Z87442 Personal history of urinary calculi: Secondary | ICD-10-CM | POA: Insufficient documentation

## 2011-08-15 LAB — URINE MICROSCOPIC-ADD ON

## 2011-08-15 LAB — CBC
Hemoglobin: 12.9 g/dL (ref 12.0–15.0)
MCH: 29.4 pg (ref 26.0–34.0)
MCHC: 33.4 g/dL (ref 30.0–36.0)
Platelets: 313 10*3/uL (ref 150–400)
RBC: 4.39 MIL/uL (ref 3.87–5.11)

## 2011-08-15 LAB — PREGNANCY, URINE: Preg Test, Ur: NEGATIVE

## 2011-08-15 LAB — BASIC METABOLIC PANEL
Calcium: 9.4 mg/dL (ref 8.4–10.5)
GFR calc non Af Amer: >60 mL/min (ref >60)
Glucose, Bld: 98 mg/dL (ref 70–99)
Potassium: 3.8 mEq/L (ref 3.5–5.1)
Sodium: 138 mEq/L (ref 135–145)

## 2011-08-15 LAB — URINALYSIS, ROUTINE W REFLEX MICROSCOPIC
Bilirubin Urine: NEGATIVE
Ketones, ur: NEGATIVE mg/dL
Leukocytes, UA: NEGATIVE
Nitrite: NEGATIVE
Specific Gravity, Urine: 1.025 (ref 1.005–1.030)
Urobilinogen, UA: 0.2 mg/dL (ref 0.0–1.0)
pH: 6 (ref 5.0–8.0)

## 2011-08-15 MED ORDER — LORAZEPAM 2 MG/ML IJ SOLN
0.5000 mg | Freq: Once | INTRAMUSCULAR | Status: AC
  Administered 2011-08-15: 22:00:00 via INTRAVENOUS
  Filled 2011-08-15: qty 1

## 2011-08-15 MED ORDER — ONDANSETRON HCL 4 MG/2ML IJ SOLN
4.0000 mg | Freq: Once | INTRAMUSCULAR | Status: AC
  Administered 2011-08-15: 4 mg via INTRAVENOUS
  Filled 2011-08-15: qty 2

## 2011-08-15 MED ORDER — SODIUM CHLORIDE 0.9 % IV BOLUS (SEPSIS)
1000.0000 mL | Freq: Once | INTRAVENOUS | Status: AC
  Administered 2011-08-15: 1000 mL via INTRAVENOUS

## 2011-08-15 MED ORDER — ONDANSETRON HCL 4 MG PO TABS: 8.0000 mg | ORAL_TABLET | Freq: Three times a day (TID) | ORAL | Status: AC | PRN

## 2011-08-15 NOTE — ED Notes (Signed)
Patient c/o cough, fever, nausea, vomiting and dizziness since Sunday (cough) and Monday (all other symptoms).

## 2011-08-15 NOTE — ED Notes (Signed)
Sick since Monday N/V, fever.  Has not seen MD.  MM's moist.  Vomited spaghetti.

## 2011-08-15 NOTE — ED Provider Notes (Signed)
History   Chart scribed for No att. providers found by [facility]; the patient was seen in room APA06/APA06; this patient's care was started at 8:12 PM.    CSN: [id number] Arrival date & time: 08/15/2011  8:04 PM  Chief Complaint  Patient presents with  . Nausea  . Fever  . Dizziness  . Cough   HPI [name] is a 30 y.o. female who presents to the Emergency Department complaining of nausea and vomiting. Pt reports nausea and vomiting onset 3 days days ago, worse with eating. No abd pain. N/V worse today, gags and vomits with any po intake. Also with lightheadedness with standing today. Pt reports fever, highest of 101 yesterday. No dysuria, hematuria, urinary frequency or urgency, diarrhea, blood in stool, vaginal bleeding or discharge. No congestion, ear ache, cp, sob, or headache. LNMP July 20.  PAST MEDICAL HISTORY:  Past Medical History  Diagnosis Date  . Kidney stone      PAST SURGICAL HISTORY:  Past Surgical History  Procedure Date  . Kidney stents     MEDICATIONS:  Previous Medications   ACAI PO    Take 1 capsule by mouth daily.     ASPIRIN-ACETAMINOPHEN-CAFFEINE (GOODY HEADACHE PO)    Take 1-2 Packages by mouth daily as needed.       ALLERGIES:  Allergies as of 08/15/2011  . (No Known Allergies)     FAMILY HISTORY:  No family history on file.   SOCIAL HISTORY: History   Social History  . Marital Status: Married    Spouse Name: N/A    Number of Children: N/A  . Years of Education: N/A   Occupational History  . Not on file.   Social History Main Topics  . Smoking status: Never Smoker   . Smokeless tobacco: Not on file  . Alcohol Use: No  . Drug Use: No  . Sexually Active:    Other Topics Concern  . Not on file   Social History Narrative  . No narrative on file      Review of Systems 10 Systems reviewed and are negative for acute change except as noted in the HPI.  Physical Exam  BP 120/70  Pulse 87  Temp(Src) 98.7 F (37.1  C) (Oral)  Resp 20  Ht 5\' 3"  (1.6 m)  Wt 134 lb (60.782 kg)  BMI 23.74 kg/m2  SpO2 97%  LMP 07/12/2011  Physical Exam  Nursing note and vitals reviewed. Constitutional: She is oriented to person, place, and time. She appears well-developed and well-nourished. No distress.  HENT:  Head: Normocephalic.  Mouth/Throat: Mucous membranes are normal.  Eyes:       Normal appearance  Neck: Normal range of motion. Neck supple.  Cardiovascular: Normal rate and regular rhythm.  Exam reveals no gallop and no friction rub.   No murmur heard. Pulmonary/Chest: Effort normal and breath sounds normal. She has no wheezes. She has no rhonchi. She has no rales.  Abdominal: Soft. There is no tenderness.  Musculoskeletal: Normal range of motion.       Normal appearance  Neurological: She is alert and oriented to person, place, and time.       Motor intact in all extremities  Skin: Skin is warm and dry. No rash noted.       Color normal  Psychiatric: She has a normal mood and affect.    ED Course  Procedures  Abdomen benign on exam. Well appearing. Contaminated appearing. Urine. Will dc home in good  condition. Pt reports feeling better at this time   OTHER DATA REVIEWED: Nursing notes and vital signs reviewed. Prior records reviewed.   LABS / RADIOLOGY: I reviewed all labs and imaging completed today. I personally reviewed the images.   No results found.  Results for orders placed during the hospital encounter of 08/15/11  PREGNANCY, URINE      Component Value Range   Preg Test, Ur NEGATIVE    URINALYSIS, ROUTINE W REFLEX MICROSCOPIC      Component Value Range   Color, Urine YELLOW  YELLOW    Appearance CLEAR  CLEAR    Specific Gravity, Urine 1.025  1.005 - 1.030    pH 6.0  5.0 - 8.0    Glucose, UA NEGATIVE  NEGATIVE (mg/dL)   Hgb urine dipstick LARGE (*) NEGATIVE    Bilirubin Urine NEGATIVE  NEGATIVE    Ketones, ur NEGATIVE  NEGATIVE (mg/dL)   Protein, ur NEGATIVE  NEGATIVE  (mg/dL)   Urobilinogen, UA 0.2  0.0 - 1.0 (mg/dL)   Nitrite NEGATIVE  NEGATIVE    Leukocytes, UA NEGATIVE  NEGATIVE   CBC      Component Value Range   WBC 9.6  4.0 - 10.5 (K/uL)   RBC 4.39  3.87 - 5.11 (MIL/uL)   Hemoglobin 12.9  12.0 - 15.0 (g/dL)   HCT 16.1  09.6 - 04.5 (%)   MCV 87.9  78.0 - 100.0 (fL)   MCH 29.4  26.0 - 34.0 (pg)   MCHC 33.4  30.0 - 36.0 (g/dL)   RDW 40.9  81.1 - 91.4 (%)   Platelets 313  150 - 400 (K/uL)  BASIC METABOLIC PANEL      Component Value Range   Sodium 138  135 - 145 (mEq/L)   Potassium 3.8  3.5 - 5.1 (mEq/L)   Chloride 99  96 - 112 (mEq/L)   CO2 30  19 - 32 (mEq/L)   Glucose, Bld 98  70 - 99 (mg/dL)   BUN 6  6 - 23 (mg/dL)   Creatinine, Ser 7.82  0.50 - 1.10 (mg/dL)   Calcium 9.4  8.4 - 95.6 (mg/dL)   GFR calc non Af Amer >60  >60 (mL/min)   GFR calc Af Amer >60  >60 (mL/min)  URINE MICROSCOPIC-ADD ON      Component Value Range   Squamous Epithelial / LPF MANY (*) RARE    WBC, UA 0-2  <3 (WBC/hpf)   RBC / HPF 3-6  <3 (RBC/hpf)   Bacteria, UA FEW (*) RARE       MDM:   IMPRESSION: 1. Nausea and vomiting      PLAN: discharge All results reviewed and discussed with pt, questions answered, pt agreeable with plan.    SCRIBE ATTESTATION: I personally performed the services described in this documentation, which was scribed in my presence. The recorded information has been reviewed and considered. Ladavia Lindenbaum Carolyne Littles, MD 08/16/11 0030

## 2011-08-15 NOTE — ED Notes (Signed)
Cont to have nausea, Dr Patria Mane aware and med given

## 2011-09-16 ENCOUNTER — Emergency Department (HOSPITAL_COMMUNITY)
Admission: EM | Admit: 2011-09-16 | Discharge: 2011-09-16 | Disposition: A | Discharge status: Home / Self Care | Payer: Self-pay | Attending: Emergency Medicine | Admitting: Emergency Medicine

## 2011-09-16 ENCOUNTER — Encounter (HOSPITAL_COMMUNITY): Payer: Self-pay | Admitting: *Deleted

## 2011-09-16 ENCOUNTER — Emergency Department (HOSPITAL_COMMUNITY): Payer: Self-pay

## 2011-09-16 DIAGNOSIS — R109 Unspecified abdominal pain: Secondary | ICD-10-CM | POA: Insufficient documentation

## 2011-09-16 DIAGNOSIS — Z79899 Other long term (current) drug therapy: Secondary | ICD-10-CM | POA: Insufficient documentation

## 2011-09-16 DIAGNOSIS — R112 Nausea with vomiting, unspecified: Secondary | ICD-10-CM | POA: Insufficient documentation

## 2011-09-16 DIAGNOSIS — N83209 Unspecified ovarian cyst, unspecified side: Secondary | ICD-10-CM | POA: Insufficient documentation

## 2011-09-16 HISTORY — DX: Calculus of kidney: N20.0

## 2011-09-16 LAB — DIFFERENTIAL
Basophils Absolute: 0 10*3/uL (ref 0.0–0.1)
Basophils Relative: 1 % (ref 0–1)
Eosinophils Relative: 2
Lymphocytes Relative: 34 % (ref 12–46)
Lymphocytes Relative: 34
Lymphs Abs: 2.4
Monocytes Absolute: 0.3 10*3/uL (ref 0.1–1.0)
Monocytes Relative: 6
Neutro Abs: 2.7 10*3/uL (ref 1.7–7.7)
Neutrophils Relative %: 58 % (ref 43–77)
Neutrophils Relative %: 58

## 2011-09-16 LAB — CBC
HCT: 36.1 % (ref 36.0–46.0)
HCT: 37.1
MCHC: 33.8 g/dL (ref 30.0–36.0)
Platelets: 234
RBC: 4.17
RDW: 12.6 % (ref 11.5–15.5)
WBC: 4.7 10*3/uL (ref 4.0–10.5)
WBC: 7.1

## 2011-09-16 LAB — URINALYSIS, ROUTINE W REFLEX MICROSCOPIC
Bilirubin Urine: NEGATIVE
Glucose, UA: NEGATIVE mg/dL
Glucose, UA: NEGATIVE
Hgb urine dipstick: NEGATIVE
Ketones, ur: NEGATIVE
Leukocytes, UA: NEGATIVE
Leukocytes, UA: NEGATIVE
Protein, ur: NEGATIVE mg/dL
Protein, ur: NEGATIVE
Specific Gravity, Urine: 1.015 (ref 1.005–1.030)
Urobilinogen, UA: 0.2 mg/dL (ref 0.0–1.0)
pH: 5.5

## 2011-09-16 LAB — BASIC METABOLIC PANEL
BUN: 5 — ABNORMAL LOW
CO2: 28 mEq/L (ref 19–32)
Chloride: 101 mEq/L (ref 96–112)
GFR calc Af Amer: >60
GFR calc Af Amer: >60 mL/min (ref >60)
GFR calc non Af Amer: >60
Potassium: 3.6 mEq/L (ref 3.5–5.1)
Potassium: 3.7
Sodium: 137 mEq/L (ref 135–145)

## 2011-09-16 LAB — WET PREP, GENITAL
Trich, Wet Prep: NONE SEEN
Yeast Wet Prep HPF POC: NONE SEEN

## 2011-09-16 LAB — URINE MICROSCOPIC-ADD ON

## 2011-09-16 LAB — PREGNANCY, URINE: Preg Test, Ur: NEGATIVE

## 2011-09-16 MED ORDER — IOHEXOL 300 MG/ML  SOLN
100.0000 mL | Freq: Once | INTRAMUSCULAR | Status: AC | PRN
  Administered 2011-09-16: 100 mL via INTRAVENOUS

## 2011-09-16 MED ORDER — IBUPROFEN 800 MG PO TABS: 800.0000 mg | ORAL_TABLET | Freq: Three times a day (TID) | ORAL | Status: AC

## 2011-09-16 MED ORDER — ONDANSETRON HCL 4 MG/2ML IJ SOLN
4.0000 mg | Freq: Once | INTRAMUSCULAR | Status: AC
  Administered 2011-09-16: 4 mg via INTRAVENOUS
  Filled 2011-09-16: qty 2

## 2011-09-16 MED ORDER — OXYCODONE-ACETAMINOPHEN 5-325 MG PO TABS: 1.0000 | ORAL_TABLET | Freq: Four times a day (QID) | ORAL | Status: DC | PRN

## 2011-09-16 NOTE — ED Provider Notes (Signed)
History   Chart scribed for Sharyl Panchal B. Bernette Mayers, MD by Enos Fling; the patient was seen in room APA07/APA07; this patient's care was started at 11:57 AM.    CSN: [id number] Arrival date & time: 09/16/2011 11:37 AM  Chief Complaint  Patient presents with  . Flank Pain   HPI [name] is a 30 y.o. female who presents to the Emergency Department complaining of flank pain. Pt c/o left flank pain radiating to left abd onset 1 week ago but much worse in past 3 days. Pt has been using AZO with no relief. Pt states this pain is similar but worse than past kidney stones. Pain has been associated with intermittent low grade fever, nausea, vomiting, and dysuria. No hematuria or vaginal bleeding. Pt h/o kidney stone and UTIs, last stone 6 months ago dx by CT scan. LNMP >1 month ago. Pt [id number].  Past Medical History  Diagnosis Date  . Kidney stone   . Kidney stones   . Kidney stones     Past Surgical History  Procedure Date  . Kidney stents   . Lithotripsy     No family history on file.  History  Substance Use Topics  . Smoking status: Never Smoker   . Smokeless tobacco: Not on file  . Alcohol Use: No    OB History    Grav Para Term Preterm Abortions TAB SAB Ect Mult Living                 Review of Systems 10 Systems reviewed and are negative for acute change except as noted in the HPI.  Allergies  Review of patient's allergies indicates no known allergies.  Home Medications   Current Outpatient Rx  Name Route Sig Dispense Refill  . GOODY HEADACHE PO Oral Take 1-2 Packages by mouth daily as needed. Pain    . ACAI PO Oral Take 1 capsule by mouth daily.        Physical Exam    BP 121/55  Pulse 72  Temp 98.2 F (36.8 C)  Resp 20  SpO2 99%  LMP 08/07/2011  Physical Exam  Nursing note and vitals reviewed. Constitutional: She is oriented to person, place, and time. She appears well-developed and well-nourished. No distress.  HENT:  Head: Normocephalic.    Mouth/Throat: Mucous membranes are normal.  Eyes: Conjunctivae are normal.  Neck: Normal range of motion. Neck supple.  Cardiovascular: Normal rate, regular rhythm and intact distal pulses.  Exam reveals no gallop and no friction rub.   No murmur heard. Pulmonary/Chest: Effort normal and breath sounds normal. She has no wheezes. She has no rales.  Abdominal: Soft. There is tenderness (LLQ).       No CVA tenderness  Genitourinary: Cervix exhibits no motion tenderness. Right adnexum displays no mass and no tenderness. Left adnexum displays tenderness. Left adnexum displays no mass. No tenderness or bleeding around the vagina. No vaginal discharge found.  Musculoskeletal: Normal range of motion.  Neurological: She is alert and oriented to person, place, and time.  Skin: Skin is warm and dry. No rash noted.  Psychiatric: She has a normal mood and affect.    ED Course  Procedures - none  LABS/RADIOLOGY: Results for orders placed during the hospital encounter of 09/16/11  URINALYSIS, ROUTINE W REFLEX MICROSCOPIC      Component Value Range   Color, Urine YELLOW  YELLOW    Appearance CLEAR  CLEAR    Specific Gravity, Urine 1.015  1.005 - 1.030  pH 8.0  5.0 - 8.0    Glucose, UA NEGATIVE  NEGATIVE (mg/dL)   Hgb urine dipstick NEGATIVE  NEGATIVE    Bilirubin Urine NEGATIVE  NEGATIVE    Ketones, ur NEGATIVE  NEGATIVE (mg/dL)   Protein, ur NEGATIVE  NEGATIVE (mg/dL)   Urobilinogen, UA 0.2  0.0 - 1.0 (mg/dL)   Nitrite NEGATIVE  NEGATIVE    Leukocytes, UA NEGATIVE  NEGATIVE   PREGNANCY, URINE      Component Value Range   Preg Test, Ur NEGATIVE    CBC      Component Value Range   WBC 4.7  4.0 - 10.5 (K/uL)   RBC 4.10  3.87 - 5.11 (MIL/uL)   Hemoglobin 12.2  12.0 - 15.0 (g/dL)   HCT 62.1  30.8 - 65.7 (%)   MCV 88.0  78.0 - 100.0 (fL)   MCH 29.8  26.0 - 34.0 (pg)   MCHC 33.8  30.0 - 36.0 (g/dL)   RDW 84.6  96.2 - 95.2 (%)   Platelets 229  150 - 400 (K/uL)  DIFFERENTIAL       Component Value Range   Neutrophils Relative 58  43 - 77 (%)   Neutro Abs 2.7  1.7 - 7.7 (K/uL)   Lymphocytes Relative 34  12 - 46 (%)   Lymphs Abs 1.6  0.7 - 4.0 (K/uL)   Monocytes Relative 6  3 - 12 (%)   Monocytes Absolute 0.3  0.1 - 1.0 (K/uL)   Eosinophils Relative 2  0 - 5 (%)   Eosinophils Absolute 0.1  0.0 - 0.7 (K/uL)   Basophils Relative 1  0 - 1 (%)   Basophils Absolute 0.0  0.0 - 0.1 (K/uL)  BASIC METABOLIC PANEL      Component Value Range   Sodium 137  135 - 145 (mEq/L)   Potassium 3.6  3.5 - 5.1 (mEq/L)   Chloride 101  96 - 112 (mEq/L)   CO2 28  19 - 32 (mEq/L)   Glucose, Bld 95  70 - 99 (mg/dL)   BUN 6  6 - 23 (mg/dL)   Creatinine, Ser 8.41  0.50 - 1.10 (mg/dL)   Calcium 8.8  8.4 - 32.4 (mg/dL)   GFR calc non Af Amer >60  >60 (mL/min)   GFR calc Af Amer >60  >60 (mL/min)  WET PREP, GENITAL      Component Value Range   Yeast, Wet Prep NONE SEEN  NONE SEEN    Trich, Wet Prep NONE SEEN  NONE SEEN    Clue Cells, Wet Prep MANY (*) NONE SEEN    WBC, Wet Prep HPF POC NONE SEEN  NONE SEEN    US Transvaginal Non-ob  09/16/2011  *RADIOLOGY REPORT*  Clinical Data:  Pelvic pain.  Rule out torsion.  TRANSABDOMINAL AND TRANSVAGINAL ULTRASOUND OF PELVIS DOPPLER ULTRASOUND OF OVARIES  Technique:  Both transabdominal and transvaginal ultrasound examinations of the pelvis were performed. Transabdominal technique was performed for global imaging of the pelvis including uterus, ovaries, adnexal regions, and pelvic cul-de-sac.  It was necessary to proceed with endovaginal exam following the transabdominal exam to visualize the ovaries and adnexa.  Color and duplex Doppler ultrasound was utilized to evaluate blood flow to the ovaries.  Comparison:  CT of 01/09/2011  Findings:  Uterus:  Normal in size and appearance  Endometrium:  Normal, at 9 mm.  Right ovary: 4.6 x 3.0 x 2.8 cm.  2.7 x 2.8 x 2.6 cm dominant follicle/simple cyst within.  Left ovary:     3.4 x 2.5 x 2.3 cmNormal appearance/no  adnexal mass  Pulsed Doppler evaluation demonstrates normal low-resistance arterial and venous waveforms in both ovaries.  IMPRESSION: Simple appearing right ovarian dominant follicle/cyst.  No acute pelvic process.  No sonographic evidence for ovarian torsion.  Original Report Authenticated By: Consuello Bossier, M.D.   US Pelvis Complete  09/16/2011  *RADIOLOGY REPORT*  Clinical Data:  Pelvic pain.  Rule out torsion.  TRANSABDOMINAL AND TRANSVAGINAL ULTRASOUND OF PELVIS DOPPLER ULTRASOUND OF OVARIES  Technique:  Both transabdominal and transvaginal ultrasound examinations of the pelvis were performed. Transabdominal technique was performed for global imaging of the pelvis including uterus, ovaries, adnexal regions, and pelvic cul-de-sac.  It was necessary to proceed with endovaginal exam following the transabdominal exam to visualize the ovaries and adnexa.  Color and duplex Doppler ultrasound was utilized to evaluate blood flow to the ovaries.  Comparison:  CT of 01/09/2011  Findings:  Uterus:  Normal in size and appearance  Endometrium:  Normal, at 9 mm.  Right ovary: 4.6 x 3.0 x 2.8 cm.  2.7 x 2.8 x 2.6 cm dominant follicle/simple cyst within.  Left ovary:     3.4 x 2.5 x 2.3 cmNormal appearance/no adnexal mass  Pulsed Doppler evaluation demonstrates normal low-resistance arterial and venous waveforms in both ovaries.  IMPRESSION: Simple appearing right ovarian dominant follicle/cyst.  No acute pelvic process.  No sonographic evidence for ovarian torsion.  Original Report Authenticated By: Consuello Bossier, M.D.   [place] Art/ven Flow Abd Pelv Doppler  09/16/2011  *RADIOLOGY REPORT*  Clinical Data:  Pelvic pain.  Rule out torsion.  TRANSABDOMINAL AND TRANSVAGINAL ULTRASOUND OF PELVIS DOPPLER ULTRASOUND OF OVARIES  Technique:  Both transabdominal and transvaginal ultrasound examinations of the pelvis were performed. Transabdominal technique was performed for global imaging of the pelvis including uterus, ovaries,  adnexal regions, and pelvic cul-de-sac.  It was necessary to proceed with endovaginal exam following the transabdominal exam to visualize the ovaries and adnexa.  Color and duplex Doppler ultrasound was utilized to evaluate blood flow to the ovaries.  Comparison:  CT of 01/09/2011  Findings:  Uterus:  Normal in size and appearance  Endometrium:  Normal, at 9 mm.  Right ovary: 4.6 x 3.0 x 2.8 cm.  2.7 x 2.8 x 2.6 cm dominant follicle/simple cyst within.  Left ovary:     3.4 x 2.5 x 2.3 cmNormal appearance/no adnexal mass  Pulsed Doppler evaluation demonstrates normal low-resistance arterial and venous waveforms in both ovaries.  IMPRESSION: Simple appearing right ovarian dominant follicle/cyst.  No acute pelvic process.  No sonographic evidence for ovarian torsion.  Original Report Authenticated By: Consuello Bossier, M.D.     MDM: Pt with small R sided ovarian cyst. No abnormalities on L side to explain pain. Normal blood and urine tests. Advised to follow-up with PCP.   SCRIBE ATTESTATION: I personally performed the services described in the documentation, which were scribed in my presence. The recorded information has been reviewed and considered.   Shavon Zenz B.       Damontay Alred B. Bernette Mayers, MD 09/16/11 0454

## 2011-09-16 NOTE — ED Notes (Signed)
Resting quietly in bed. Contiues to complain of flank pain. Awaiting test results and md recheck

## 2011-09-16 NOTE — ED Notes (Signed)
Pain in left flank with dysuria

## 2011-09-17 LAB — GC/CHLAMYDIA PROBE AMP, GENITAL
Chlamydia, DNA Probe: NEGATIVE
GC Probe Amp, Genital: NEGATIVE

## 2011-09-18 LAB — POCT CARDIAC MARKERS
Myoglobin, poc: 16.7
Operator id: 267321
Troponin i, poc: <0.05

## 2011-09-18 LAB — DIFFERENTIAL
Basophils Absolute: 0
Lymphocytes Relative: 38
Lymphs Abs: 2.8
Neutro Abs: 3.8
Neutrophils Relative %: 52

## 2011-09-18 LAB — BASIC METABOLIC PANEL
CO2: 26
Chloride: 104
GFR calc Af Amer: >60
Sodium: 136

## 2011-09-18 LAB — CBC
HCT: 36.5
Hemoglobin: 12.8
Platelets: 213
RBC: 4.08
RDW: 13.9

## 2011-09-20 LAB — CBC
HCT: 35.8 — ABNORMAL LOW
MCHC: 34.1
MCV: 89.8
RBC: 3.99

## 2011-09-20 LAB — URINALYSIS, ROUTINE W REFLEX MICROSCOPIC
Bilirubin Urine: NEGATIVE
Ketones, ur: NEGATIVE
Nitrite: NEGATIVE
Specific Gravity, Urine: 1.025
Urobilinogen, UA: 1

## 2011-09-20 LAB — DIFFERENTIAL
Basophils Absolute: 0.1
Eosinophils Relative: 2
Lymphocytes Relative: 38
Lymphs Abs: 2.7
Neutro Abs: 3.8
Neutrophils Relative %: 53

## 2011-09-20 LAB — COMPREHENSIVE METABOLIC PANEL
AST: 17
BUN: 8
CO2: 29
Calcium: 8.8
Chloride: 107
Creatinine, Ser: 0.59
GFR calc Af Amer: >60
GFR calc non Af Amer: >60
Total Bilirubin: 0.4

## 2011-09-20 LAB — PREGNANCY, URINE: Preg Test, Ur: NEGATIVE

## 2011-09-20 LAB — URINE MICROSCOPIC-ADD ON

## 2011-09-20 LAB — LIPASE, BLOOD: Lipase: 38

## 2011-09-24 LAB — CSF CELL COUNT WITH DIFFERENTIAL

## 2011-09-24 LAB — GRAM STAIN

## 2011-09-24 LAB — CBC
HCT: 35 — ABNORMAL LOW
Hemoglobin: 11.9 — ABNORMAL LOW
RBC: 3.86 — ABNORMAL LOW
RDW: 13.5
WBC: 6.6

## 2011-09-24 LAB — DIFFERENTIAL
Basophils Absolute: 0
Basophils Relative: 0
Eosinophils Absolute: 0.3
Monocytes Absolute: 0.4
Monocytes Relative: 6
Neutro Abs: 4.2
Neutrophils Relative %: 64

## 2011-09-24 LAB — COMPREHENSIVE METABOLIC PANEL
ALT: 19
Alkaline Phosphatase: 63
BUN: 7
CO2: 28
Chloride: 105
Glucose, Bld: 105 — ABNORMAL HIGH
Potassium: 3.7
Sodium: 136
Total Bilirubin: 0.4
Total Protein: 6.5

## 2011-09-24 LAB — URINALYSIS, ROUTINE W REFLEX MICROSCOPIC
Bilirubin Urine: NEGATIVE
Glucose, UA: NEGATIVE
Ketones, ur: NEGATIVE
Protein, ur: NEGATIVE
pH: 7

## 2011-09-24 LAB — PREGNANCY, URINE: Preg Test, Ur: NEGATIVE

## 2011-09-24 LAB — URINE MICROSCOPIC-ADD ON

## 2011-10-01 LAB — CBC
Hemoglobin: 11.8 — ABNORMAL LOW
RBC: 3.88
RDW: 13.1
WBC: 7.2

## 2011-10-01 LAB — URINALYSIS, ROUTINE W REFLEX MICROSCOPIC
Nitrite: NEGATIVE
Specific Gravity, Urine: 1.025
Urobilinogen, UA: 0.2

## 2011-10-01 LAB — BASIC METABOLIC PANEL
Calcium: 8.6
GFR calc Af Amer: >60
GFR calc non Af Amer: >60
Glucose, Bld: 108 — ABNORMAL HIGH
Sodium: 136

## 2011-10-01 LAB — DIFFERENTIAL
Basophils Absolute: 0
Lymphocytes Relative: 34
Monocytes Absolute: 0.4
Neutro Abs: 4.2

## 2011-10-01 LAB — URINE MICROSCOPIC-ADD ON

## 2011-10-01 LAB — PREGNANCY, URINE: Preg Test, Ur: NEGATIVE

## 2011-10-02 LAB — BASIC METABOLIC PANEL
Calcium: 9.1
Creatinine, Ser: 0.59
GFR calc Af Amer: >60
GFR calc non Af Amer: >60
Glucose, Bld: 110 — ABNORMAL HIGH
Sodium: 140

## 2011-10-02 LAB — DIFFERENTIAL
Basophils Absolute: 0
Lymphocytes Relative: 26
Monocytes Absolute: 0.5
Neutro Abs: 5.9
Neutrophils Relative %: 66

## 2011-10-02 LAB — CBC
Hemoglobin: 12
RDW: 13.2

## 2011-10-02 LAB — URINE MICROSCOPIC-ADD ON

## 2011-10-02 LAB — URINALYSIS, ROUTINE W REFLEX MICROSCOPIC
Bilirubin Urine: NEGATIVE
Specific Gravity, Urine: 1.02
pH: 7

## 2011-10-07 LAB — URINALYSIS, ROUTINE W REFLEX MICROSCOPIC
Leukocytes, UA: NEGATIVE
Nitrite: NEGATIVE
Protein, ur: NEGATIVE
Specific Gravity, Urine: 1.02
Urobilinogen, UA: 0.2

## 2011-10-07 LAB — URINE MICROSCOPIC-ADD ON

## 2011-10-07 LAB — PREGNANCY, URINE: Preg Test, Ur: NEGATIVE

## 2011-10-24 ENCOUNTER — Encounter (HOSPITAL_COMMUNITY): Payer: Self-pay | Admitting: *Deleted

## 2011-10-24 ENCOUNTER — Emergency Department (HOSPITAL_COMMUNITY)
Admission: EM | Admit: 2011-10-24 | Discharge: 2011-10-25 | Disposition: A | Discharge status: Home / Self Care | Payer: Self-pay | Attending: Emergency Medicine | Admitting: Emergency Medicine

## 2011-10-24 DIAGNOSIS — R109 Unspecified abdominal pain: Secondary | ICD-10-CM | POA: Insufficient documentation

## 2011-10-24 DIAGNOSIS — R300 Dysuria: Secondary | ICD-10-CM | POA: Insufficient documentation

## 2011-10-24 DIAGNOSIS — R110 Nausea: Secondary | ICD-10-CM | POA: Insufficient documentation

## 2011-10-24 DIAGNOSIS — R350 Frequency of micturition: Secondary | ICD-10-CM | POA: Insufficient documentation

## 2011-10-24 DIAGNOSIS — Z87442 Personal history of urinary calculi: Secondary | ICD-10-CM | POA: Insufficient documentation

## 2011-10-24 LAB — COMPREHENSIVE METABOLIC PANEL
AST: 25 U/L (ref 0–37)
Albumin: 3.9 g/dL (ref 3.5–5.2)
BUN: 8 mg/dL (ref 6–23)
CO2: 28 mEq/L (ref 19–32)
Calcium: 9.2 mg/dL (ref 8.4–10.5)
Chloride: 102 mEq/L (ref 96–112)
Creatinine, Ser: 0.66 mg/dL (ref 0.50–1.10)
GFR calc non Af Amer: >90 mL/min (ref >90)
Total Bilirubin: 0.2 mg/dL — ABNORMAL LOW (ref 0.3–1.2)

## 2011-10-24 LAB — URINE MICROSCOPIC-ADD ON

## 2011-10-24 LAB — DIFFERENTIAL
Basophils Absolute: 0 10*3/uL (ref 0.0–0.1)
Basophils Relative: 0 % (ref 0–1)
Eosinophils Relative: 2 % (ref 0–5)
Monocytes Absolute: 0.6 10*3/uL (ref 0.1–1.0)
Monocytes Relative: 7 % (ref 3–12)
Neutro Abs: 5.4 10*3/uL (ref 1.7–7.7)

## 2011-10-24 LAB — CBC
HCT: 36.4 % (ref 36.0–46.0)
Hemoglobin: 12.1 g/dL (ref 12.0–15.0)
MCHC: 33.2 g/dL (ref 30.0–36.0)
MCV: 88.8 fL (ref 78.0–100.0)
RDW: 12.9 % (ref 11.5–15.5)

## 2011-10-24 LAB — URINALYSIS, ROUTINE W REFLEX MICROSCOPIC
Bilirubin Urine: NEGATIVE
Glucose, UA: NEGATIVE mg/dL
Ketones, ur: NEGATIVE mg/dL
Protein, ur: NEGATIVE mg/dL
pH: 7 (ref 5.0–8.0)

## 2011-10-24 MED ORDER — HYDROMORPHONE HCL PF 4 MG/ML IJ SOLN: 4.0000 mg | Freq: Once | INTRAMUSCULAR | Status: DC

## 2011-10-24 MED ORDER — HYDROMORPHONE HCL PF 2 MG/ML IJ SOLN
2.0000 mg | Freq: Once | INTRAMUSCULAR | Status: AC
  Administered 2011-10-24: 2 mg via INTRAVENOUS
  Filled 2011-10-24: qty 20

## 2011-10-24 MED ORDER — KETOROLAC TROMETHAMINE 30 MG/ML IJ SOLN
30.0000 mg | Freq: Once | INTRAMUSCULAR | Status: AC
  Administered 2011-10-24: 30 mg via INTRAVENOUS
  Filled 2011-10-24: qty 1

## 2011-10-24 MED ORDER — ONDANSETRON HCL 4 MG/2ML IJ SOLN
4.0000 mg | Freq: Once | INTRAMUSCULAR | Status: AC
  Administered 2011-10-24: 4 mg via INTRAVENOUS
  Filled 2011-10-24: qty 2

## 2011-10-24 NOTE — ED Provider Notes (Addendum)
History     CSN: [id number] Arrival date & time: 10/24/2011  9:59 PM   First MD Initiated Contact with Patient 10/24/11 2250      Chief Complaint  Patient presents with  . Flank Pain  . Dysuria    (Consider location/radiation/quality/duration/timing/severity/associated sxs/prior treatment) HPI Comments: Pt has had L posterior flank pain x 4 days consistent with previous episodes of kidney stones.  She has a urologist in [place].  Denies fever or chills.  Patient is a 30 y.o. female presenting with flank pain and dysuria. The history is provided by the patient. No language interpreter was used.  Flank Pain This is a new problem. Episode onset: 4 days ago. The problem occurs constantly. The problem has been unchanged. Associated symptoms include nausea and urinary symptoms. Pertinent negatives include no chills or fever. Exacerbated by: urinating. She has tried nothing for the symptoms.  Dysuria  Associated symptoms include nausea, frequency and flank pain. Pertinent negatives include no chills and no hematuria.  Flank Pain This is a new problem. Episode onset: 4 days ago. The problem occurs constantly. The problem has been unchanged. Exacerbated by: urinating. She has tried nothing for the symptoms.    Past Medical History  Diagnosis Date  . Kidney stone   . Kidney stones   . Kidney stones     Past Surgical History  Procedure Date  . Kidney stents   . Lithotripsy     No family history on file.  History  Substance Use Topics  . Smoking status: Never Smoker   . Smokeless tobacco: Not on file  . Alcohol Use: No    OB History    Grav Para Term Preterm Abortions TAB SAB Ect Mult Living                  Review of Systems  Constitutional: Negative for fever and chills.  Gastrointestinal: Positive for nausea.  Genitourinary: Positive for dysuria, frequency and flank pain. Negative for hematuria.  All other systems reviewed and are negative.    Allergies  Review of  patient's allergies indicates no known allergies.  Home Medications   Current Outpatient Rx  Name Route Sig Dispense Refill  . GOODY HEADACHE PO Oral Take 1-2 Packages by mouth daily as needed. Pain    . ACAI PO Oral Take 1 capsule by mouth daily.        LMP 08/10/2011  Physical Exam  Nursing note and vitals reviewed. Constitutional: She is oriented to person, place, and time. She appears well-developed and well-nourished. No distress.  HENT:  Head: Normocephalic and atraumatic.  Eyes: EOM are normal.  Neck: Normal range of motion.  Cardiovascular: Normal rate, regular rhythm and normal heart sounds.   Pulmonary/Chest: Effort normal and breath sounds normal.  Abdominal: Soft. She exhibits no distension. There is no tenderness.  Genitourinary: No vaginal discharge found.  Musculoskeletal: Normal range of motion.       Back:  Neurological: She is alert and oriented to person, place, and time.  Skin: Skin is warm and dry.  Psychiatric: She has a normal mood and affect. Judgment normal.    ED Course  Procedures (including critical care time)  Labs Reviewed  URINALYSIS, ROUTINE W REFLEX MICROSCOPIC - Abnormal; Notable for the following:    Appearance HAZY (*)    Hgb urine dipstick MODERATE (*)    All other components within normal limits  COMPREHENSIVE METABOLIC PANEL - Abnormal; Notable for the following:    Glucose, Bld 101 (*)  Total Bilirubin 0.2 (*)    All other components within normal limits  URINE MICROSCOPIC-ADD ON - Abnormal; Notable for the following:    Squamous Epithelial / LPF MANY (*)    Bacteria, UA FEW (*)    All other components within normal limits  POCT PREGNANCY, URINE  CBC  DIFFERENTIAL  POCT PREGNANCY, URINE   No results found.   No diagnosis found.    MDM  0000i looked in PACs to determined whether there were any stones present on her most recent CT of the ab/pelvis and how large they were in an attempt to not scan her again if  possible.  What i found was that she has head 7-8 CT's since nov of 2008.  Dr. Marene Lenz noted on [phone] that no stones had been found on seven CT's prior to that date going back 18 months.  i spoke with the pt about my concern for scanning her again and said we would look at her lab work before proceeding further.  There is no sign of blood on her current u/a.  i spoke with dr. Dierdre Highman and he thinks we can use the u/s to possibly localize any stone that might be present.  No stone visualized on u/s.  Pt was told to follow up with her urologist tomorrow and with her PCP.  She understands and agrees.        Worthy Rancher, PA 10/25/11 [phone]  I evaluated PT and reviewed her old records - she states a h/o kidney stones diagnosed at [facility].  She has no documented kidney stones by multiple CT scans. I suspect she has been mis-informed about this diagnosis. Her exam today is ABD s/nt/nd and localizes pain to left lower lumbar more so than left flank and presentation is NOT c/w ureterolithiasis. UA no blood. Labs reviewed. She is stable for d/c home without imaging today and has PCP follow up.   Medical screening examination/treatment/procedure(s) were conducted as a shared visit with non-physician practitioner(s) and myself.  I personally evaluated the patient during the encounter  Sunnie Nielsen, MD 10/25/11 0111  Sunnie Nielsen, MD 10/25/11 [phone]

## 2011-10-24 NOTE — ED Notes (Signed)
Pt reports left sided flank pain radiating to left lower abd, pt also reports dysuria x 2 days

## 2011-10-25 MED ORDER — PROMETHAZINE HCL 25 MG/ML IJ SOLN
25.0000 mg | Freq: Once | INTRAMUSCULAR | Status: AC
  Administered 2011-10-25: 25 mg via INTRAVENOUS
  Filled 2011-10-25: qty 1

## 2011-10-25 MED ORDER — HYDROCODONE-ACETAMINOPHEN 5-325 MG PO TABS: ORAL_TABLET | ORAL | Status: DC

## 2011-10-25 MED ORDER — ONDANSETRON HCL 4 MG PO TABS: 4.0000 mg | ORAL_TABLET | Freq: Four times a day (QID) | ORAL | Status: AC

## 2011-10-25 MED ORDER — HYDROMORPHONE HCL PF 1 MG/ML IJ SOLN
1.0000 mg | Freq: Once | INTRAMUSCULAR | Status: AC
  Administered 2011-10-25: 1 mg via INTRAVENOUS
  Filled 2011-10-25: qty 1

## 2011-10-25 NOTE — ED Notes (Signed)
Pt activity vomiting MD aware; Order received

## 2011-10-25 NOTE — ED Notes (Signed)
Received report assessment unchanged 

## 2012-07-09 ENCOUNTER — Encounter (HOSPITAL_COMMUNITY): Payer: Self-pay | Admitting: *Deleted

## 2012-07-09 ENCOUNTER — Emergency Department (HOSPITAL_COMMUNITY)
Admission: EM | Admit: 2012-07-09 | Discharge: 2012-07-10 | Disposition: A | Discharge status: Home / Self Care | Payer: Self-pay | Attending: Emergency Medicine | Admitting: Emergency Medicine

## 2012-07-09 DIAGNOSIS — Z87442 Personal history of urinary calculi: Secondary | ICD-10-CM | POA: Insufficient documentation

## 2012-07-09 DIAGNOSIS — R1032 Left lower quadrant pain: Secondary | ICD-10-CM | POA: Insufficient documentation

## 2012-07-09 DIAGNOSIS — R112 Nausea with vomiting, unspecified: Secondary | ICD-10-CM | POA: Insufficient documentation

## 2012-07-09 DIAGNOSIS — R109 Unspecified abdominal pain: Secondary | ICD-10-CM

## 2012-07-09 DIAGNOSIS — N83209 Unspecified ovarian cyst, unspecified side: Secondary | ICD-10-CM

## 2012-07-09 LAB — CBC
HCT: 40.1 % (ref 36.0–46.0)
Hemoglobin: 13.4 g/dL (ref 12.0–15.0)
MCH: 30.1 pg (ref 26.0–34.0)
MCHC: 33.4 g/dL (ref 30.0–36.0)
MCV: 90.1 fL (ref 78.0–100.0)
RBC: 4.45 MIL/uL (ref 3.87–5.11)

## 2012-07-09 LAB — URINALYSIS, ROUTINE W REFLEX MICROSCOPIC
Bilirubin Urine: NEGATIVE
Glucose, UA: NEGATIVE mg/dL
Hgb urine dipstick: NEGATIVE
Ketones, ur: NEGATIVE mg/dL
Protein, ur: NEGATIVE mg/dL
pH: 6 (ref 5.0–8.0)

## 2012-07-09 MED ORDER — SODIUM CHLORIDE 0.9 % IV BOLUS (SEPSIS)
1000.0000 mL | INTRAVENOUS | Status: AC
  Administered 2012-07-09: 1000 mL via INTRAVENOUS

## 2012-07-09 MED ORDER — ONDANSETRON HCL 4 MG/2ML IJ SOLN
4.0000 mg | Freq: Once | INTRAMUSCULAR | Status: AC
  Administered 2012-07-09: 4 mg via INTRAVENOUS
  Filled 2012-07-09: qty 2

## 2012-07-09 MED ORDER — HYDROMORPHONE HCL PF 1 MG/ML IJ SOLN
1.0000 mg | Freq: Once | INTRAMUSCULAR | Status: AC
  Administered 2012-07-09: 1 mg via INTRAVENOUS
  Filled 2012-07-09: qty 1

## 2012-07-09 MED ORDER — SODIUM CHLORIDE 0.9 % IV SOLN
Freq: Once | INTRAVENOUS | Status: AC
  Administered 2012-07-10: 01:00:00 via INTRAVENOUS

## 2012-07-09 NOTE — ED Notes (Signed)
Vomiting at triage 

## 2012-07-09 NOTE — ED Notes (Signed)
Low abd pain esp LLQ  Nausea, dizzy, No vomiting,

## 2012-07-09 NOTE — ED Provider Notes (Signed)
History     CSN: [id number]  Arrival date & time 07/09/12  2233   First MD Initiated Contact with Patient 07/09/12 2254      Chief Complaint  Patient presents with  . Abdominal Pain    (Consider location/radiation/quality/duration/timing/severity/associated sxs/prior treatment) HPI  [name] is a 31 y.o. female who presents to the Emergency Department complaining of lower abdominal pain with increased pain to the LLQ over the past 3-4 hours associated with minor nausea, vomiting x 1. Patient is 2 months late with period. Took a home pregnancy test that was positive. She has had three pregnancies and three live births.   PCP Dr. Phillips Odor     Past Medical History  Diagnosis Date  . Kidney stone   . Kidney stones   . Kidney stones     Past Surgical History  Procedure Date  . Kidney stents   . Lithotripsy     History reviewed. No pertinent family history.  History  Substance Use Topics  . Smoking status: Never Smoker   . Smokeless tobacco: Not on file  . Alcohol Use: No    OB History    Grav Para Term Preterm Abortions TAB SAB Ect Mult Living                  Review of Systems  Constitutional: Negative for fever.       10 Systems reviewed and are negative for acute change except as noted in the HPI.  HENT: Negative for congestion.   Eyes: Negative for discharge and redness.  Respiratory: Negative for cough and shortness of breath.   Cardiovascular: Negative for chest pain.  Gastrointestinal: Positive for abdominal pain. Negative for vomiting.  Musculoskeletal: Negative for back pain.  Skin: Negative for rash.  Neurological: Negative for syncope, numbness and headaches.  Psychiatric/Behavioral:       No behavior change.    Allergies  Review of patient's allergies indicates no known allergies.  Home Medications   Current Outpatient Rx  Name Route Sig Dispense Refill  . ACAI PO Oral Take 1 capsule by mouth daily.      Marlin Canary HEADACHE PO Oral  Take 1-2 Packages by mouth daily as needed. Pain    . HYDROCODONE-ACETAMINOPHEN 5-325 MG PO TABS  Take one tab po q 4-6 hrs prn pain 20 tablet 0    BP 143/83  Pulse 130  Temp 98.6 F (37 C) (Oral)  Resp 20  Ht 5\' 3"  (1.6 m)  Wt 142 lb (64.411 kg)  BMI 25.15 kg/m2  SpO2 100%  LMP 05/10/2012  Physical Exam  Nursing note and vitals reviewed. Constitutional: She is oriented to person, place, and time. She appears well-developed and well-nourished. No distress.       Awake, alert, nontoxic appearance.  HENT:  Head: Normocephalic and atraumatic.  Eyes: Conjunctivae and EOM are normal. Pupils are equal, round, and reactive to light. Right eye exhibits no discharge. Left eye exhibits no discharge.  Neck: Neck supple.  Pulmonary/Chest: Effort normal. She exhibits no tenderness.  Abdominal: Soft. There is tenderness. There is no rebound.       LLQ with guarding, no rebound  Genitourinary: Vagina normal and uterus normal.  Musculoskeletal: Normal range of motion. She exhibits no tenderness.       Baseline ROM, no obvious new focal weakness.  Neurological: She is alert and oriented to person, place, and time.       Mental status and motor strength appears baseline for  patient and situation.  Skin: Skin is warm. No rash noted.  Psychiatric: She has a normal mood and affect.    ED Course  Procedures (including critical care time)  Results for orders placed during the hospital encounter of 07/09/12  URINALYSIS, ROUTINE W REFLEX MICROSCOPIC      Component Value Range   Color, Urine AMBER (*) YELLOW   APPearance CLEAR  CLEAR   Specific Gravity, Urine 1.025  1.005 - 1.030   pH 6.0  5.0 - 8.0   Glucose, UA NEGATIVE  NEGATIVE mg/dL   Hgb urine dipstick NEGATIVE  NEGATIVE   Bilirubin Urine NEGATIVE  NEGATIVE   Ketones, ur NEGATIVE  NEGATIVE mg/dL   Protein, ur NEGATIVE  NEGATIVE mg/dL   Urobilinogen, UA 0.2  0.0 - 1.0 mg/dL   Nitrite NEGATIVE  NEGATIVE   Leukocytes, UA NEGATIVE   NEGATIVE  POCT PREGNANCY, URINE      Component Value Range   Preg Test, Ur NEGATIVE  NEGATIVE  CBC      Component Value Range   WBC 8.1  4.0 - 10.5 K/uL   RBC 4.45  3.87 - 5.11 MIL/uL   Hemoglobin 13.4  12.0 - 15.0 g/dL   HCT 16.1  09.6 - 04.5 %   MCV 90.1  78.0 - 100.0 fL   MCH 30.1  26.0 - 34.0 pg   MCHC 33.4  30.0 - 36.0 g/dL   RDW 40.9  81.1 - 91.4 %   Platelets 343  150 - 400 K/uL      MDM  Patient with LLQ abdominal pain, late period. Pregnancy test is negative. Given IVF, analgesic, antiemetic with relief. Labs unremarkable. Dx testing d/w pt.  Questions answered.  Verb understanding, agreeable to d/c home with outpt f/u. Follow up with Dr. Emelda Fear, OB/GYN.  Pt feels improved after observation and/or treatment in ED.Pt stable in ED with no significant deterioration in condition.The patient appears reasonably screened and/or stabilized for discharge and I doubt any other medical condition or other [facility] requiring further screening, evaluation, or treatment in the ED at this time prior to discharge.  MDM Reviewed: nursing note and vitals Interpretation: labs            Nicoletta Dress. Colon Branch, MD 07/10/12 [phone]

## 2012-07-10 LAB — BASIC METABOLIC PANEL
Calcium: 9.5 mg/dL (ref 8.4–10.5)
Creatinine, Ser: 0.63 mg/dL (ref 0.50–1.10)
GFR calc Af Amer: >90 mL/min (ref >90)
GFR calc non Af Amer: >90 mL/min (ref >90)
Sodium: 139 mEq/L (ref 135–145)

## 2012-07-10 MED ORDER — PROMETHAZINE HCL 25 MG PO TABS: 12.5000 mg | ORAL_TABLET | Freq: Four times a day (QID) | ORAL | Status: DC | PRN

## 2012-07-10 MED ORDER — ONDANSETRON HCL 4 MG/2ML IJ SOLN
INTRAMUSCULAR | Status: AC
  Administered 2012-07-10: 4 mg
  Filled 2012-07-10: qty 2

## 2012-07-10 MED ORDER — IBUPROFEN 800 MG PO TABS: 800.0000 mg | ORAL_TABLET | Freq: Three times a day (TID) | ORAL | Status: AC

## 2012-07-10 MED ORDER — HYDROCODONE-ACETAMINOPHEN 5-325 MG PO TABS: 1.0000 | ORAL_TABLET | ORAL | Status: DC | PRN

## 2013-01-01 ENCOUNTER — Encounter (HOSPITAL_COMMUNITY): Payer: Self-pay | Admitting: *Deleted

## 2013-01-01 ENCOUNTER — Emergency Department (HOSPITAL_COMMUNITY): Payer: Self-pay

## 2013-01-01 ENCOUNTER — Emergency Department (HOSPITAL_COMMUNITY)
Admission: EM | Admit: 2013-01-01 | Discharge: 2013-01-01 | Disposition: A | Discharge status: Home / Self Care | Payer: Self-pay | Attending: Emergency Medicine | Admitting: Emergency Medicine

## 2013-01-01 DIAGNOSIS — Z87442 Personal history of urinary calculi: Secondary | ICD-10-CM | POA: Insufficient documentation

## 2013-01-01 DIAGNOSIS — R109 Unspecified abdominal pain: Secondary | ICD-10-CM | POA: Insufficient documentation

## 2013-01-01 DIAGNOSIS — R112 Nausea with vomiting, unspecified: Secondary | ICD-10-CM | POA: Insufficient documentation

## 2013-01-01 DIAGNOSIS — Z3202 Encounter for pregnancy test, result negative: Secondary | ICD-10-CM | POA: Insufficient documentation

## 2013-01-01 LAB — URINALYSIS, ROUTINE W REFLEX MICROSCOPIC
Bilirubin Urine: NEGATIVE
Ketones, ur: NEGATIVE mg/dL
Nitrite: NEGATIVE
Protein, ur: NEGATIVE mg/dL
Urobilinogen, UA: 0.2 mg/dL (ref 0.0–1.0)

## 2013-01-01 LAB — URINE MICROSCOPIC-ADD ON

## 2013-01-01 LAB — PREGNANCY, URINE: Preg Test, Ur: NEGATIVE

## 2013-01-01 MED ORDER — ALBUTEROL SULFATE HFA 108 (90 BASE) MCG/ACT IN AERS
2.0000 | INHALATION_SPRAY | RESPIRATORY_TRACT | Status: DC | PRN
  Administered 2013-01-01: 2 via RESPIRATORY_TRACT
  Filled 2013-01-01: qty 6.7

## 2013-01-01 MED ORDER — KETOROLAC TROMETHAMINE 30 MG/ML IJ SOLN
30.0000 mg | Freq: Once | INTRAMUSCULAR | Status: AC
  Administered 2013-01-01: 30 mg via INTRAVENOUS
  Filled 2013-01-01: qty 1

## 2013-01-01 MED ORDER — SODIUM CHLORIDE 0.9 % IV BOLUS (SEPSIS)
1000.0000 mL | Freq: Once | INTRAVENOUS | Status: AC
  Administered 2013-01-01: 1000 mL via INTRAVENOUS

## 2013-01-01 MED ORDER — ONDANSETRON HCL 4 MG/2ML IJ SOLN
4.0000 mg | Freq: Once | INTRAMUSCULAR | Status: AC
  Administered 2013-01-01: 4 mg via INTRAVENOUS
  Filled 2013-01-01: qty 2

## 2013-01-01 MED ORDER — HYDROMORPHONE HCL PF 1 MG/ML IJ SOLN
INTRAMUSCULAR | Status: AC
  Administered 2013-01-01: 1 mg
  Filled 2013-01-01: qty 1

## 2013-01-01 NOTE — ED Notes (Addendum)
Pt reporting abdominal pain on left lower side. Symptoms began over 1 week ago.  States pain is worse today.  Also reporting some nausea and weakness.  Pt reports history of kidney stones.  Also has history of 2 ectopic pregnancies.  Pt reports taking a home pregnancy test and it was inconclusive.

## 2013-01-01 NOTE — ED Provider Notes (Signed)
History     CSN: [id number]  Arrival date & time 01/01/13  1757   First MD Initiated Contact with Patient 01/01/13 2041      No chief complaint on file.   (Consider location/radiation/quality/duration/timing/severity/associated sxs/prior treatment) HPI Patient with llq pain for a week with worsening symptoms with burning pain.  Nausea and vomiting today.  No history of missed menses, no frequency of urination.  No fever or chills.   Past Medical History  Diagnosis Date  . Kidney stone   . Kidney stones   . Kidney stones     Past Surgical History  Procedure Date  . Kidney stents   . Lithotripsy     History reviewed. No pertinent family history.  History  Substance Use Topics  . Smoking status: Never Smoker   . Smokeless tobacco: Not on file  . Alcohol Use: No    OB History    Grav Para Term Preterm Abortions TAB SAB Ect Mult Living                  Review of Systems  All other systems reviewed and are negative.    Allergies  Review of patient's allergies indicates no known allergies.  Home Medications   Current Outpatient Rx  Name  Route  Sig  Dispense  Refill  . GOODY HEADACHE PO   Oral   Take 1-2 Packages by mouth daily as needed. Pain           BP 105/82  Pulse 82  Temp 98.5 F (36.9 C)  Resp 18  Ht 5\' 3"  (1.6 m)  Wt 142 lb (64.411 kg)  BMI 25.15 kg/m2  SpO2 99%  LMP 11/10/2012  Physical Exam  Nursing note and vitals reviewed. Constitutional: She is oriented to person, place, and time. She appears well-developed and well-nourished.  HENT:  Head: Normocephalic and atraumatic.  Eyes: Conjunctivae normal are normal. Pupils are equal, round, and reactive to light.  Neck: Normal range of motion. Neck supple.  Cardiovascular: Normal rate and regular rhythm.   Pulmonary/Chest: Effort normal and breath sounds normal.  Abdominal: Soft. Bowel sounds are normal.       Moderate diffuse lower abdominal tenderness  Musculoskeletal: Normal range  of motion.  Neurological: She is alert and oriented to person, place, and time. She has normal reflexes.  Skin: Skin is warm.  Psychiatric: She has a normal mood and affect. Her behavior is normal. Judgment and thought content normal.    ED Course  Procedures (including critical care time)  Labs Reviewed  URINALYSIS, ROUTINE W REFLEX MICROSCOPIC - Abnormal; Notable for the following:    APPearance CLOUDY (*)     Hgb urine dipstick MODERATE (*)     All other components within normal limits  URINE MICROSCOPIC-ADD ON - Abnormal; Notable for the following:    Squamous Epithelial / LPF FEW (*)     Bacteria, UA MANY (*)     All other components within normal limits  PREGNANCY, URINE  URINE CULTURE   Ct Abdomen Pelvis Wo Contrast  01/01/2013  *RADIOLOGY REPORT*  Clinical Data: Left lower sided pain.  Concern for kidney stones.  CT ABDOMEN AND PELVIS WITHOUT CONTRAST  Technique:  Multidetector CT imaging of the abdomen and pelvis was performed following the standard protocol without intravenous contrast.  Comparison: CT 09/16/2011  Findings:  Renal:  Non-IV contrast images demonstrate no nephrolithiasis or ureterolithiasis.  There are bilateral extrarenal pelves.  No obstructive uropathy.  Lung bases are  clear.  No pericardial fluid.  No focal hepatic lesion this noncontrast exam.  The gallbladder, pancreas, spleen, adrenal glands are normal.  The stomach, small bowel, and cecum are normal.  Post appendectomy.  The colon and rectosigmoid colon are normal.  Abdominal aorta normal caliber.  No retroperitoneal periportal lymphadenopathy.  There is a 2.9 cm cyst associated with the right ovary. This similar to 09/16/2011.  Left ovary is normal.  The uterus is normal.  Bladder is normal without evidence of bladder stones.  No free fluid the pelvis.  No pelvic lymphadenopathy. Review of bone windows demonstrates no aggressive osseous lesions.  IMPRESSION:  1.  No nephrolithiasis or ureterolithiasis. 2.   Cystic lesion associated with the right ovary likely represents a functional ovarian cyst. 3.  Post appendectomy.   Original Report Authenticated By: Genevive Bi, M.D.      No diagnosis found.    MDM  Patient with llq pain, no stone seen on ct, s/p appendectomy.         Hilario Quarry, MD 01/01/13 2204

## 2013-01-03 LAB — URINE CULTURE: Colony Count: >=100000

## 2013-04-05 ENCOUNTER — Emergency Department (HOSPITAL_COMMUNITY)
Admission: EM | Admit: 2013-04-05 | Discharge: 2013-04-06 | Disposition: A | Discharge status: Home / Self Care | Payer: Self-pay | Attending: Emergency Medicine | Admitting: Emergency Medicine

## 2013-04-05 ENCOUNTER — Encounter (HOSPITAL_COMMUNITY): Payer: Self-pay | Admitting: *Deleted

## 2013-04-05 DIAGNOSIS — Z87442 Personal history of urinary calculi: Secondary | ICD-10-CM | POA: Insufficient documentation

## 2013-04-05 DIAGNOSIS — R05 Cough: Secondary | ICD-10-CM | POA: Insufficient documentation

## 2013-04-05 DIAGNOSIS — M546 Pain in thoracic spine: Secondary | ICD-10-CM | POA: Insufficient documentation

## 2013-04-05 DIAGNOSIS — R509 Fever, unspecified: Secondary | ICD-10-CM | POA: Insufficient documentation

## 2013-04-05 DIAGNOSIS — R059 Cough, unspecified: Secondary | ICD-10-CM | POA: Insufficient documentation

## 2013-04-05 DIAGNOSIS — R112 Nausea with vomiting, unspecified: Secondary | ICD-10-CM | POA: Insufficient documentation

## 2013-04-05 DIAGNOSIS — R0602 Shortness of breath: Secondary | ICD-10-CM | POA: Insufficient documentation

## 2013-04-05 DIAGNOSIS — R109 Unspecified abdominal pain: Secondary | ICD-10-CM | POA: Insufficient documentation

## 2013-04-05 DIAGNOSIS — Z3202 Encounter for pregnancy test, result negative: Secondary | ICD-10-CM | POA: Insufficient documentation

## 2013-04-05 NOTE — ED Notes (Addendum)
Abd pain, NV. Lt lower back pain and LLQ Pain  Vomiting at triage.

## 2013-04-06 ENCOUNTER — Emergency Department (HOSPITAL_COMMUNITY): Payer: Self-pay

## 2013-04-06 LAB — CBC WITH DIFFERENTIAL/PLATELET
Basophils Absolute: 0 10*3/uL (ref 0.0–0.1)
Basophils Relative: 1 % (ref 0–1)
Eosinophils Relative: 4 % (ref 0–5)
HCT: 36.4 % (ref 36.0–46.0)
MCHC: 34.1 g/dL (ref 30.0–36.0)
Monocytes Absolute: 0.5 10*3/uL (ref 0.1–1.0)
Neutro Abs: 5.7 10*3/uL (ref 1.7–7.7)
RDW: 13.6 % (ref 11.5–15.5)

## 2013-04-06 LAB — URINE MICROSCOPIC-ADD ON

## 2013-04-06 LAB — COMPREHENSIVE METABOLIC PANEL
AST: 24 U/L (ref 0–37)
Albumin: 3.5 g/dL (ref 3.5–5.2)
Calcium: 9.2 mg/dL (ref 8.4–10.5)
Chloride: 101 mEq/L (ref 96–112)
Creatinine, Ser: 0.82 mg/dL (ref 0.50–1.10)
Total Protein: 6.8 g/dL (ref 6.0–8.3)

## 2013-04-06 LAB — URINALYSIS, ROUTINE W REFLEX MICROSCOPIC
Glucose, UA: NEGATIVE mg/dL
Ketones, ur: NEGATIVE mg/dL
Leukocytes, UA: NEGATIVE
Protein, ur: NEGATIVE mg/dL

## 2013-04-06 LAB — POCT PREGNANCY, URINE: Preg Test, Ur: NEGATIVE

## 2013-04-06 MED ORDER — IOHEXOL 300 MG/ML  SOLN
50.0000 mL | Freq: Once | INTRAMUSCULAR | Status: AC | PRN
  Administered 2013-04-06: 50 mL via ORAL

## 2013-04-06 MED ORDER — IOHEXOL 300 MG/ML  SOLN
100.0000 mL | Freq: Once | INTRAMUSCULAR | Status: AC | PRN
  Administered 2013-04-06: 100 mL via INTRAVENOUS

## 2013-04-06 MED ORDER — SODIUM CHLORIDE 0.9 % IV BOLUS (SEPSIS)
1000.0000 mL | Freq: Once | INTRAVENOUS | Status: AC
  Administered 2013-04-06: 1000 mL via INTRAVENOUS

## 2013-04-06 MED ORDER — MORPHINE SULFATE 4 MG/ML IJ SOLN
4.0000 mg | Freq: Once | INTRAMUSCULAR | Status: AC
  Administered 2013-04-06: 4 mg via INTRAVENOUS
  Filled 2013-04-06: qty 1

## 2013-04-06 MED ORDER — PROMETHAZINE HCL 25 MG/ML IJ SOLN
12.5000 mg | Freq: Once | INTRAMUSCULAR | Status: AC
  Administered 2013-04-06: 12.5 mg via INTRAVENOUS
  Filled 2013-04-06: qty 1

## 2013-04-06 MED ORDER — IBUPROFEN 800 MG PO TABS: 800.0000 mg | ORAL_TABLET | Freq: Three times a day (TID) | ORAL | Status: DC

## 2013-04-06 MED ORDER — ONDANSETRON HCL 4 MG/2ML IJ SOLN
4.0000 mg | Freq: Once | INTRAMUSCULAR | Status: AC
  Administered 2013-04-06: 4 mg via INTRAVENOUS
  Filled 2013-04-06: qty 2

## 2013-04-06 NOTE — ED Provider Notes (Signed)
Medical screening examination/treatment/procedure(s) were conducted as a shared visit with non-physician practitioner(s) and myself.  I personally evaluated the patient during the encounter. ABD pain improved with medications. CT scan reviewed, GI referral provided, return precautions verbalized as understood,   Results for orders placed during the hospital encounter of 04/05/13  URINALYSIS, ROUTINE W REFLEX MICROSCOPIC      Result Value Range   Color, Urine YELLOW  YELLOW   APPearance CLEAR  CLEAR   Specific Gravity, Urine 1.020  1.005 - 1.030   pH 6.5  5.0 - 8.0   Glucose, UA NEGATIVE  NEGATIVE mg/dL   Hgb urine dipstick SMALL (*) NEGATIVE   Bilirubin Urine NEGATIVE  NEGATIVE   Ketones, ur NEGATIVE  NEGATIVE mg/dL   Protein, ur NEGATIVE  NEGATIVE mg/dL   Urobilinogen, UA 0.2  0.0 - 1.0 mg/dL   Nitrite NEGATIVE  NEGATIVE   Leukocytes, UA NEGATIVE  NEGATIVE  CBC WITH DIFFERENTIAL      Result Value Range   WBC 8.9  4.0 - 10.5 K/uL   RBC 4.10  3.87 - 5.11 MIL/uL   Hemoglobin 12.4  12.0 - 15.0 g/dL   HCT 40.9  81.1 - 91.4 %   MCV 88.8  78.0 - 100.0 fL   MCH 30.2  26.0 - 34.0 pg   MCHC 34.1  30.0 - 36.0 g/dL   RDW 78.2  95.6 - 21.3 %   Platelets 257  150 - 400 K/uL   Neutrophils Relative 63  43 - 77 %   Neutro Abs 5.7  1.7 - 7.7 K/uL   Lymphocytes Relative 27  12 - 46 %   Lymphs Abs 2.4  0.7 - 4.0 K/uL   Monocytes Relative 5  3 - 12 %   Monocytes Absolute 0.5  0.1 - 1.0 K/uL   Eosinophils Relative 4  0 - 5 %   Eosinophils Absolute 0.3  0.0 - 0.7 K/uL   Basophils Relative 1  0 - 1 %   Basophils Absolute 0.0  0.0 - 0.1 K/uL  COMPREHENSIVE METABOLIC PANEL      Result Value Range   Sodium 137  135 - 145 mEq/L   Potassium 3.4 (*) 3.5 - 5.1 mEq/L   Chloride 101  96 - 112 mEq/L   CO2 28  19 - 32 mEq/L   Glucose, Bld 111 (*) 70 - 99 mg/dL   BUN 8  6 - 23 mg/dL   Creatinine, Ser 0.86  0.50 - 1.10 mg/dL   Calcium 9.2  8.4 - 57.8 mg/dL   Total Protein 6.8  6.0 - 8.3 g/dL   Albumin  3.5  3.5 - 5.2 g/dL   AST 24  0 - 37 U/L   ALT 35  0 - 35 U/L   Alkaline Phosphatase 75  39 - 117 U/L   Total Bilirubin 0.1 (*) 0.3 - 1.2 mg/dL   GFR calc non Af Amer >90  >90 mL/min   GFR calc Af Amer >90  >90 mL/min  LIPASE, BLOOD      Result Value Range   Lipase 52  11 - 59 U/L  URINE MICROSCOPIC-ADD ON      Result Value Range   Squamous Epithelial / LPF MANY (*) RARE   WBC, UA 0-2  <3 WBC/hpf   RBC / HPF 0-2  <3 RBC/hpf   Bacteria, UA FEW (*) RARE  POCT PREGNANCY, URINE      Result Value Range   Preg Test, Ur NEGATIVE  NEGATIVE   Dg Chest 2 View  04/06/2013  *RADIOLOGY REPORT*  Clinical Data: Cough, congestion, fever and vomiting.  CHEST - 2 VIEW  Comparison: Chest radiograph performed 10/04/2010  Findings: The lungs are well-aerated and clear.  There is no evidence of focal opacification, pleural effusion or pneumothorax. Mildly increased density at the lung bases is thought to reflect overlying soft tissues.  The heart is normal in size; the mediastinal contour is within normal limits.  No acute osseous abnormalities are seen.  IMPRESSION: No acute cardiopulmonary process seen.   Original Report Authenticated By: Tonia Ghent, M.D.    Ct Abdomen Pelvis W Contrast  04/06/2013  *RADIOLOGY REPORT*  Clinical Data: Left lower quadrant abdominal pain, fever and nausea.  CT ABDOMEN AND PELVIS WITH CONTRAST  Technique:  Multidetector CT imaging of the abdomen and pelvis was performed following the standard protocol during bolus administration of intravenous contrast.  Contrast: 100 mL of Omnipaque 300 IV contrast  Comparison: CT of the abdomen and pelvis performed 01/01/2013, and pelvic ultrasound performed 09/16/2011  Findings: Minimal bibasilar atelectasis is noted.  The liver and spleen are unremarkable in appearance.  The gallbladder is within normal limits.  The pancreas and adrenal glands are unremarkable.  The kidneys are unremarkable in appearance.  There is no evidence of  hydronephrosis.  No renal or ureteral stones are seen.  No perinephric stranding is appreciated.  No free fluid is identified.  The small bowel is unremarkable in appearance.  The stomach is within normal limits.  No acute vascular abnormalities are seen.  The patient is status post appendectomy.  The colon is unremarkable in appearance.  The bladder is moderately distended and grossly unremarkable in appearance.  The uterus is grossly unremarkable.  A 3.0 cm cyst at the right adnexa is stable from prior studies, and decreased in size from 2012, likely physiologic in nature.  The ovaries are otherwise grossly unremarkable.  No inguinal lymphadenopathy is seen.  No acute osseous abnormalities are identified.  IMPRESSION: No acute abnormality seen within the abdomen or pelvis.   Original Report Authenticated By: Tonia Ghent, M.D.       Sunnie Nielsen, MD 04/06/13 [phone]

## 2013-04-06 NOTE — ED Provider Notes (Signed)
History     CSN: [id number]  Arrival date & time 04/05/13  2255   First MD Initiated Contact with Patient 04/05/13 2330      Chief Complaint  Patient presents with  . Abdominal Pain    (Consider location/radiation/quality/duration/timing/severity/associated sxs/prior treatment) HPI Comments: [name] is a 32 y.o. Female presenting with left lower flank pain with radiation into her left lower pelvis which started out intermittently,  But now constant and burning in character.  She has a history of kidney stones, but reports this feels different as she denies worsened pain with urination and has had no hematuria and has been urinating less than normal since her symptoms started.  She also describes left mid back pain radiating into her left breast area, shortness of breath and fever to 101 degrees over the past 2 days.  She has had some episodes of post tussive emesis, although also states has been nauseated.      The history is provided by the patient.    Past Medical History  Diagnosis Date  . Kidney stone   . Kidney stones   . Kidney stones     Past Surgical History  Procedure Laterality Date  . Kidney stents    . Lithotripsy      History reviewed. No pertinent family history.  History  Substance Use Topics  . Smoking status: Never Smoker   . Smokeless tobacco: Not on file  . Alcohol Use: No    OB History   Grav Para Term Preterm Abortions TAB SAB Ect Mult Living                  Review of Systems  Constitutional: Positive for fever and chills.  HENT: Negative for congestion, sore throat, rhinorrhea and neck pain.   Eyes: Negative.   Respiratory: Positive for cough and shortness of breath. Negative for chest tightness.   Cardiovascular: Negative for chest pain.  Gastrointestinal: Positive for nausea and vomiting. Negative for abdominal pain and constipation.  Genitourinary: Positive for flank pain.  Musculoskeletal: Positive for back pain. Negative  for joint swelling and arthralgias.  Skin: Negative.  Negative for rash and wound.  Neurological: Negative for dizziness, weakness, light-headedness, numbness and headaches.  Psychiatric/Behavioral: Negative.     Allergies  Review of patient's allergies indicates no known allergies.  Home Medications   Current Outpatient Rx  Name  Route  Sig  Dispense  Refill  . Aspirin-Acetaminophen-Caffeine (GOODY HEADACHE PO)   Oral   Take 1-2 Packages by mouth daily as needed. Pain           BP 149/59  Pulse 113  Temp(Src) 98.7 F (37.1 C) (Oral)  Resp 20  Ht 5\' 3"  (1.6 m)  Wt 138 lb (62.596 kg)  BMI 24.45 kg/m2  SpO2 100%  LMP 02/10/2013  Physical Exam  Nursing note and vitals reviewed. Constitutional: She appears well-developed and well-nourished.  HENT:  Head: Normocephalic and atraumatic.  Eyes: Conjunctivae are normal.  Neck: Normal range of motion.  Cardiovascular: Regular rhythm, normal heart sounds and intact distal pulses.  Tachycardia present.   Pulmonary/Chest: Effort normal and breath sounds normal. She has no wheezes. She has no rhonchi.  Abdominal: Soft. Bowel sounds are normal. There is no tenderness. There is CVA tenderness. There is no rebound and no guarding.  cva ttp left.  Musculoskeletal: Normal range of motion.  Neurological: She is alert.  Skin: Skin is warm and dry.  Psychiatric: She has a normal mood  and affect.    ED Course  Procedures (including critical care time)  Medications  sodium chloride 0.9 % bolus 1,000 mL (1,000 mLs Intravenous New Bag/Given 04/06/13 0028)  promethazine (PHENERGAN) injection 12.5 mg (not administered)  morphine 4 MG/ML injection 4 mg (not administered)  ondansetron (ZOFRAN) injection 4 mg (4 mg Intravenous Given 04/06/13 0028)  morphine 4 MG/ML injection 4 mg (4 mg Intravenous Given 04/06/13 0029)     Labs Reviewed  URINALYSIS, ROUTINE W REFLEX MICROSCOPIC   No results found.   No diagnosis found.    MDM  Pt  given IV fluids,  Morphine 4 mg IV,  zofran 4 mg with no relief of pain and nausea.  Labs reviewed,  cxr reviewed and discussed with pt.  Prior chart also reviewed including no renal stones on Ct scan performed 1/14.  At re-exam, pt still with moderate ttp left lateral lower back.  She has pain with deep palpation which also elicited with left SLR (psoas sign).  CT scan with contrast ordered.  Dr. Dierdre Highman to follow pt.        Burgess Amor, PA-C 04/06/13 [phone]

## 2013-05-20 ENCOUNTER — Emergency Department (HOSPITAL_COMMUNITY)
Admission: EM | Admit: 2013-05-20 | Discharge: 2013-05-20 | Disposition: A | Discharge status: Home / Self Care | Payer: Self-pay | Attending: Emergency Medicine | Admitting: Emergency Medicine

## 2013-05-20 ENCOUNTER — Encounter (HOSPITAL_COMMUNITY): Payer: Self-pay | Admitting: *Deleted

## 2013-05-20 DIAGNOSIS — Z331 Pregnant state, incidental: Secondary | ICD-10-CM | POA: Insufficient documentation

## 2013-05-20 DIAGNOSIS — Z87442 Personal history of urinary calculi: Secondary | ICD-10-CM | POA: Insufficient documentation

## 2013-05-20 DIAGNOSIS — Z3490 Encounter for supervision of normal pregnancy, unspecified, unspecified trimester: Secondary | ICD-10-CM

## 2013-05-20 LAB — URINALYSIS, ROUTINE W REFLEX MICROSCOPIC
Leukocytes, UA: NEGATIVE
Nitrite: NEGATIVE
Specific Gravity, Urine: 1.01 (ref 1.005–1.030)
Urobilinogen, UA: 0.2 mg/dL (ref 0.0–1.0)
pH: 7 (ref 5.0–8.0)

## 2013-05-20 LAB — POCT PREGNANCY, URINE: Preg Test, Ur: POSITIVE — AB

## 2013-05-20 MED ORDER — PRENATAL COMPLETE 14-0.4 MG PO TABS: ORAL_TABLET | ORAL | Status: DC

## 2013-05-20 NOTE — ED Notes (Signed)
Left breast pain x 4 days.  States hurts worse with pressure applied.

## 2013-05-20 NOTE — ED Notes (Signed)
Alert, NAD,  Pain lt breast, increased pain when not wearing a bra. No nipple drainage.  Says she was seen here app 1 month ago with burning pain LLQ. Which continues . In April had light menstrual flow of  1 day.only.

## 2013-05-20 NOTE — ED Provider Notes (Signed)
History     CSN: [id number]  Arrival date & time 05/20/13  1040   First MD Initiated Contact with Patient 05/20/13 1111      Chief Complaint  Patient presents with  . Breast Pain    (Consider location/radiation/quality/duration/timing/severity/associated sxs/prior treatment) HPI Comments: [name] is a 32 y.o. female who presents to the Emergency Department complaining of bilateral breast tenderness.  States the symptoms have been worsening for 4 days.  Left breast is worse than right.  She also describes a heaviness or pressure to her breasts when she does not wear a bra.  She denies redness, nipple drainage, abd pain, or fever.  She states that her last menstrual period was in March and had one day of spotting in April.  She has not taken a home pregnancy test  The history is provided by the patient.    Past Medical History  Diagnosis Date  . Kidney stone   . Kidney stones   . Kidney stones     Past Surgical History  Procedure Laterality Date  . Kidney stents    . Lithotripsy      No family history on file.  History  Substance Use Topics  . Smoking status: Never Smoker   . Smokeless tobacco: Not on file  . Alcohol Use: No    OB History   Grav Para Term Preterm Abortions TAB SAB Ect Mult Living                  Review of Systems  Constitutional: Negative for fever, chills, activity change and appetite change.  HENT: Negative for facial swelling, neck pain and neck stiffness.   Respiratory: Negative for cough, chest tightness and shortness of breath.   Cardiovascular: Negative for chest pain.  Gastrointestinal: Negative for nausea, vomiting and abdominal pain.  Genitourinary: Negative for dysuria, urgency, flank pain, vaginal bleeding, vaginal discharge and pelvic pain.       Bilateral breast tenderness  Skin: Negative for rash.  Neurological: Negative for dizziness, speech difficulty, weakness, numbness and headaches.  Hematological: Negative for  adenopathy.  All other systems reviewed and are negative.    Allergies  Review of patient's allergies indicates no known allergies.  Home Medications  No current outpatient prescriptions on file.  BP 121/80  Pulse 80  Temp(Src) 98.7 F (37.1 C) (Oral)  Resp 14  Ht 5\' 3"  (1.6 m)  Wt 138 lb (62.596 kg)  BMI 24.45 kg/m2  SpO2 100%  LMP 04/10/2013  Physical Exam  Nursing note and vitals reviewed. Constitutional: She is oriented to person, place, and time. She appears well-developed and well-nourished. No distress.  HENT:  Head: Normocephalic and atraumatic.  Neck: Normal range of motion. Neck supple. No thyromegaly present.  Cardiovascular: Normal rate, regular rhythm, normal heart sounds and intact distal pulses.   No murmur heard. Pulmonary/Chest: Effort normal and breath sounds normal. No respiratory distress.  Abdominal: Soft. Bowel sounds are normal. She exhibits no distension and no mass. There is no tenderness. There is no rebound and no guarding.  Genitourinary: There is breast tenderness. No breast swelling, discharge or bleeding.  Diffuse ttp of the bilateral breast.  No erythema, masses, lesion of the skin or nipple discharge  Musculoskeletal: She exhibits no edema.  Lymphadenopathy:    She has no cervical adenopathy.  Neurological: She is alert and oriented to person, place, and time. She exhibits normal muscle tone. Coordination normal.  Skin: Skin is warm and dry. No rash noted.  ED Course  Procedures (including critical care time)  Results for orders placed during the hospital encounter of 05/20/13  URINALYSIS, ROUTINE W REFLEX MICROSCOPIC      Result Value Range   Color, Urine YELLOW  YELLOW   APPearance CLEAR  CLEAR   Specific Gravity, Urine 1.010  1.005 - 1.030   pH 7.0  5.0 - 8.0   Glucose, UA NEGATIVE  NEGATIVE mg/dL   Hgb urine dipstick NEGATIVE  NEGATIVE   Bilirubin Urine NEGATIVE  NEGATIVE   Ketones, ur NEGATIVE  NEGATIVE mg/dL   Protein, ur  NEGATIVE  NEGATIVE mg/dL   Urobilinogen, UA 0.2  0.0 - 1.0 mg/dL   Nitrite NEGATIVE  NEGATIVE   Leukocytes, UA NEGATIVE  NEGATIVE  POCT PREGNANCY, URINE      Result Value Range   Preg Test, Ur POSITIVE (*) NEGATIVE         MDM   Patient bilateral breast tenderness with positive pregnancy.  No vomiting abd pain or vaginal bleeding.  Agrees to f/u with Dr. Emelda Fear.  Patient is G4P3Ab 0  Advised to return here if sx's develops  VSS.  Patient is well appearing and stable for discharge.      Blanton Kardell L. Trisha Mangle, PA-C 05/20/13 2153

## 2013-05-21 NOTE — ED Provider Notes (Signed)
Medical screening examination/treatment/procedure(s) were performed by non-physician practitioner and as supervising physician I was immediately available for consultation/collaboration.   Joya Gaskins, MD 05/21/13 [phone]

## 2013-05-26 ENCOUNTER — Other Ambulatory Visit: Payer: Self-pay | Admitting: Obstetrics & Gynecology

## 2013-05-26 DIAGNOSIS — O3680X0 Pregnancy with inconclusive fetal viability, not applicable or unspecified: Secondary | ICD-10-CM

## 2013-05-31 ENCOUNTER — Emergency Department (HOSPITAL_COMMUNITY)
Admission: EM | Admit: 2013-05-31 | Discharge: 2013-06-01 | Disposition: A | Discharge status: Home / Self Care | Payer: Medicaid Other | Attending: Emergency Medicine | Admitting: Emergency Medicine

## 2013-05-31 ENCOUNTER — Ambulatory Visit (INDEPENDENT_AMBULATORY_CARE_PROVIDER_SITE_OTHER): Payer: Medicaid Other

## 2013-05-31 ENCOUNTER — Other Ambulatory Visit: Payer: Self-pay | Admitting: Obstetrics & Gynecology

## 2013-05-31 ENCOUNTER — Encounter (HOSPITAL_COMMUNITY): Payer: Self-pay | Admitting: Emergency Medicine

## 2013-05-31 DIAGNOSIS — O3680X0 Pregnancy with inconclusive fetal viability, not applicable or unspecified: Secondary | ICD-10-CM

## 2013-05-31 DIAGNOSIS — R319 Hematuria, unspecified: Secondary | ICD-10-CM

## 2013-05-31 DIAGNOSIS — N23 Unspecified renal colic: Secondary | ICD-10-CM

## 2013-05-31 DIAGNOSIS — O9989 Other specified diseases and conditions complicating pregnancy, childbirth and the puerperium: Secondary | ICD-10-CM | POA: Insufficient documentation

## 2013-05-31 DIAGNOSIS — M549 Dorsalgia, unspecified: Secondary | ICD-10-CM | POA: Insufficient documentation

## 2013-05-31 DIAGNOSIS — Z87442 Personal history of urinary calculi: Secondary | ICD-10-CM | POA: Insufficient documentation

## 2013-05-31 DIAGNOSIS — Z3490 Encounter for supervision of normal pregnancy, unspecified, unspecified trimester: Secondary | ICD-10-CM

## 2013-05-31 LAB — URINALYSIS, ROUTINE W REFLEX MICROSCOPIC
Nitrite: NEGATIVE
Protein, ur: NEGATIVE mg/dL
Specific Gravity, Urine: 1.015 (ref 1.005–1.030)
Urobilinogen, UA: 1 mg/dL (ref 0.0–1.0)

## 2013-05-31 LAB — URINE MICROSCOPIC-ADD ON

## 2013-05-31 MED ORDER — ONDANSETRON HCL 4 MG/2ML IJ SOLN
4.0000 mg | Freq: Once | INTRAMUSCULAR | Status: AC
  Administered 2013-05-31: 4 mg via INTRAVENOUS
  Filled 2013-05-31: qty 2

## 2013-05-31 MED ORDER — HYDROMORPHONE HCL PF 1 MG/ML IJ SOLN
0.5000 mg | Freq: Once | INTRAMUSCULAR | Status: AC
  Administered 2013-06-01: 0.5 mg via INTRAVENOUS
  Filled 2013-05-31: qty 1

## 2013-05-31 NOTE — ED Provider Notes (Signed)
History     CSN: [id number]  Arrival date & time 05/31/13  2000   First MD Initiated Contact with Patient 05/31/13 2246      Chief Complaint  Patient presents with  . Back Pain    (Consider location/radiation/quality/duration/timing/severity/associated sxs/prior treatment) HPI Comments: Patient is a 32 year old female who presents to the emergency department with complaint of back pain. It is of note that the patient states she is [redacted] weeks pregnant. The back pain has been going on for at least 3-4 days. The patient was seen by her GYN physician earlier today, she had an ultrasound done which confirmed that the baby was in the proper position within the wound. There was no evidence of any ectopic pregnancy. There was question of a ovarian cyst. But this was inconclusive on the current ultrasound. The patient states that upon coming home the pain got progressively worse and at points seem to radiate from the back to the left lower abdomen and groin. 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 She states that this pain feels a lot like a kidney stone. There's been no recent fever or chills. She has not seen actual blood in the urine. Patient presents at this time for evaluation of this pain and for assistance with his pain.  Patient is a 32 y.o. female presenting with back pain. The history is provided by the patient.  Back Pain Associated symptoms: no abdominal pain, no chest pain and no dysuria     Past Medical History  Diagnosis Date  . Kidney stone   . Kidney stones   . Kidney stones     Past Surgical History  Procedure Laterality Date  . Kidney stents    . Lithotripsy      History reviewed. No pertinent family history.  History  Substance Use Topics  . Smoking status: Never Smoker   . Smokeless tobacco: Not on file  . Alcohol Use: No    OB History   Grav Para Term Preterm Abortions TAB SAB Ect  Mult Living                  Review of Systems  Constitutional: Negative for activity change.       All ROS Neg except as noted in HPI  HENT: Negative for nosebleeds and neck pain.   Eyes: Negative for photophobia and discharge.  Respiratory: Negative for cough, shortness of breath and wheezing.   Cardiovascular: Negative for chest pain and palpitations.  Gastrointestinal: Negative for abdominal pain and blood in stool.  Genitourinary: Positive for flank pain. Negative for dysuria, frequency and hematuria.  Musculoskeletal: Positive for back pain. Negative for arthralgias.  Skin: Negative.   Neurological: Negative for dizziness, seizures and speech difficulty.  Psychiatric/Behavioral: Negative for hallucinations and confusion.    Allergies  Review of patient's allergies indicates no known allergies.  Home Medications   Current Outpatient Rx  Name  Route  Sig  Dispense  Refill  . Prenatal Vit-Fe Fumarate-FA (PRENATAL COMPLETE) 14-0.4 MG TABS      One tab po qd   30 each   0     BP 91/72  Pulse 96  Temp(Src) 97.9 F (36.6 C) (Oral)  Resp 20  Ht 5\' 3"  (1.6 m)  Wt 138 lb (62.596 kg)  BMI 24.45 kg/m2  SpO2 100%  LMP 04/10/2013  Physical Exam  Nursing note and vitals reviewed. Constitutional:  She is oriented to person, place, and time. She appears well-developed and well-nourished.  Non-toxic appearance.  HENT:  Head: Normocephalic.  Right Ear: Tympanic membrane and external ear normal.  Left Ear: Tympanic membrane and external ear normal.  Eyes: EOM and lids are normal. Pupils are equal, round, and reactive to light.  Neck: Normal range of motion. Neck supple. Carotid bruit is not present.  Cardiovascular: Normal rate, regular rhythm, normal heart sounds, intact distal pulses and normal pulses.   Pulmonary/Chest: Breath sounds normal. No respiratory distress.  Abdominal: Soft. Bowel sounds are normal. There is no tenderness. There is no guarding.  Left CVAT   Musculoskeletal: Normal range of motion.  Left paraspinal area pain in the lumbar region. No hot areas appreciated.  Lymphadenopathy:       Head (right side): No submandibular adenopathy present.       Head (left side): No submandibular adenopathy present.    She has no cervical adenopathy.  Neurological: She is alert and oriented to person, place, and time. She has normal strength. No cranial nerve deficit or sensory deficit.  Skin: Skin is warm and dry.  Psychiatric: She has a normal mood and affect. Her speech is normal.    ED Course  Procedures (including critical care time)  Labs Reviewed  URINALYSIS, ROUTINE W REFLEX MICROSCOPIC - Abnormal; Notable for the following:    APPearance CLOUDY (*)    Hgb urine dipstick LARGE (*)    Leukocytes, UA TRACE (*)    All other components within normal limits  URINE MICROSCOPIC-ADD ON - Abnormal; Notable for the following:    Squamous Epithelial / LPF FEW (*)    Bacteria, UA FEW (*)    All other components within normal limits   No results found.   No diagnosis found.    MDM  I have reviewed nursing notes, vital signs, and all appropriate lab and imaging results for this patient. Patient states she is [redacted] weeks pregnant. She had ultrasound today to confirm that the fetus is in the proper position. There was no evidence of ectopic pregnancy at the GYN office today. There's been no recent fever or chills reported. There's been no visible hematuria.  Patient was cautiously treated with Dilaudid 0.5 mg IV, with significant improvement in the pain. Urinalysis reveals a cloudy specimen with a specific gravity 1.015 there is a large amount of hemoglobin present and 21-50 red cells noted under the microscopic view. Suspect the patient has renal colic related to a kidney stone.  Plan at this time is for the patient to strain all urine. She is to see Dr. Emelda Fear for recommendations and assistance with pain while she is dealing with this kidney  stone issue. Prescription for Norco 5 mg one every 4-6 hours given to the patient. Patient will use Tylenol additionally and use the Norco as a backup medication for more difficult pain.       Kathie Dike, PA-C 06/01/13 [phone]

## 2013-05-31 NOTE — ED Notes (Signed)
Patient states she is [redacted] weeks pregnant and is having back pain radiating into her left lower abdomen x 3 days. States she was seen by PCP today and was told she had a cyst on her ovary. Patient states pain is worse.

## 2013-05-31 NOTE — ED Notes (Signed)
Pt states she had multiple kidney stones with her last pregnancy 8 yrs ago. None since. Feels similar. Had an OB sono today which showed viable intrauterine pregnancy with a "pregnancy ovarian cyst" on the left

## 2013-06-01 MED ORDER — HYDROCODONE-ACETAMINOPHEN 5-325 MG PO TABS
1.0000 | ORAL_TABLET | ORAL | Status: DC | PRN
Start: 2013-06-01 — End: 2013-06-09

## 2013-06-01 MED ORDER — HYDROCODONE-ACETAMINOPHEN 5-325 MG PO TABS
2.0000 | ORAL_TABLET | Freq: Once | ORAL | Status: AC
  Administered 2013-06-01: 2 via ORAL
  Filled 2013-06-01: qty 2

## 2013-06-01 NOTE — ED Provider Notes (Signed)
Medical screening examination/treatment/procedure(s) were performed by non-physician practitioner and as supervising physician I was immediately available for consultation/collaboration.   Lyanne Co, MD 06/01/13 [phone]

## 2013-06-07 ENCOUNTER — Ambulatory Visit (INDEPENDENT_AMBULATORY_CARE_PROVIDER_SITE_OTHER): Payer: Medicaid Other

## 2013-06-07 ENCOUNTER — Other Ambulatory Visit: Payer: Self-pay | Admitting: Obstetrics & Gynecology

## 2013-06-07 ENCOUNTER — Ambulatory Visit (INDEPENDENT_AMBULATORY_CARE_PROVIDER_SITE_OTHER): Payer: Medicaid Other | Admitting: Women's Health

## 2013-06-07 ENCOUNTER — Encounter: Payer: Self-pay | Admitting: Women's Health

## 2013-06-07 ENCOUNTER — Other Ambulatory Visit (HOSPITAL_COMMUNITY)
Admission: RE | Admit: 2013-06-07 | Discharge: 2013-06-07 | Disposition: A | Discharge status: Home / Self Care | Payer: Medicaid Other | Source: Ambulatory Visit | Attending: Obstetrics and Gynecology | Admitting: Obstetrics and Gynecology

## 2013-06-07 VITALS — BP 122/64 | Wt 153.2 lb

## 2013-06-07 DIAGNOSIS — O2342 Unspecified infection of urinary tract in pregnancy, second trimester: Secondary | ICD-10-CM | POA: Insufficient documentation

## 2013-06-07 DIAGNOSIS — O2390 Unspecified genitourinary tract infection in pregnancy, unspecified trimester: Secondary | ICD-10-CM

## 2013-06-07 DIAGNOSIS — O2341 Unspecified infection of urinary tract in pregnancy, first trimester: Secondary | ICD-10-CM

## 2013-06-07 DIAGNOSIS — O3680X1 Pregnancy with inconclusive fetal viability, fetus 1: Secondary | ICD-10-CM

## 2013-06-07 DIAGNOSIS — O0990 Supervision of high risk pregnancy, unspecified, unspecified trimester: Secondary | ICD-10-CM | POA: Insufficient documentation

## 2013-06-07 DIAGNOSIS — Z01419 Encounter for gynecological examination (general) (routine) without abnormal findings: Secondary | ICD-10-CM | POA: Insufficient documentation

## 2013-06-07 DIAGNOSIS — Z1389 Encounter for screening for other disorder: Secondary | ICD-10-CM

## 2013-06-07 DIAGNOSIS — Z113 Encounter for screening for infections with a predominantly sexual mode of transmission: Secondary | ICD-10-CM | POA: Insufficient documentation

## 2013-06-07 DIAGNOSIS — Z3481 Encounter for supervision of other normal pregnancy, first trimester: Secondary | ICD-10-CM

## 2013-06-07 DIAGNOSIS — O219 Vomiting of pregnancy, unspecified: Secondary | ICD-10-CM

## 2013-06-07 DIAGNOSIS — O210 Mild hyperemesis gravidarum: Secondary | ICD-10-CM

## 2013-06-07 DIAGNOSIS — Z1151 Encounter for screening for human papillomavirus (HPV): Secondary | ICD-10-CM | POA: Insufficient documentation

## 2013-06-07 DIAGNOSIS — O3680X0 Pregnancy with inconclusive fetal viability, not applicable or unspecified: Secondary | ICD-10-CM

## 2013-06-07 DIAGNOSIS — Z331 Pregnant state, incidental: Secondary | ICD-10-CM

## 2013-06-07 LAB — HIV ANTIBODY (ROUTINE TESTING W REFLEX): HIV: NONREACTIVE

## 2013-06-07 LAB — CBC
HCT: 37.2 % (ref 36.0–46.0)
Hemoglobin: 12.5 g/dL (ref 12.0–15.0)
MCV: 87.7 fL (ref 78.0–100.0)
RBC: 4.24 MIL/uL (ref 3.87–5.11)
WBC: 7.5 10*3/uL (ref 4.0–10.5)

## 2013-06-07 LAB — POCT URINALYSIS DIPSTICK
Ketones, UA: NEGATIVE
Leukocytes, UA: NEGATIVE
Protein, UA: NEGATIVE

## 2013-06-07 MED ORDER — DOXYLAMINE-PYRIDOXINE 10-10 MG PO TBEC: 10.0000 mg | DELAYED_RELEASE_TABLET | ORAL | Status: DC

## 2013-06-07 MED ORDER — PRENATAL PLUS 27-1 MG PO TABS: 1.0000 | ORAL_TABLET | Freq: Every day | ORAL | Status: DC

## 2013-06-07 MED ORDER — NITROFURANTOIN MONOHYD MACRO 100 MG PO CAPS: 100.0000 mg | ORAL_CAPSULE | Freq: Two times a day (BID) | ORAL | Status: DC

## 2013-06-07 NOTE — Progress Notes (Signed)
U/S at 6+ wks: retroverted uterus, single IUP, CRL c/w 6+ wks, EDC 01-30-2014, FHR 115 b/min, lt ovary WNL, RT ovary with 3.3 cm simple cyst, tender to palpation

## 2013-06-07 NOTE — Progress Notes (Signed)
Pain in both sides. New OB packet given. Consents signed.

## 2013-06-07 NOTE — Patient Instructions (Addendum)
Nausea & Vomiting  Have saltine crackers or pretzels by your bed and eat a few bites before you raise your head out of bed in the morning  Eat small frequent meals throughout the day instead of large meals  Drink plenty of fluids throughout the day to stay hydrated, just don't drink a lot of fluids with your meals.  This can make your stomach fill up faster making you feel sick  Do not brush your teeth right after you eat  Products with real ginger are good for nausea, like ginger ale and ginger hard candy Make sure it says made with real ginger!  Sucking on sour candy like lemon heads is also good for nausea  If your prenatal vitamins make you nauseated, take them at night so you will sleep through the nausea  If you feel like you need medicine for the nausea & vomiting please let us know  If you are unable to keep any fluids or food down please let us know    Pregnancy - First Trimester During sexual intercourse, millions of sperm go into the vagina. Only 1 sperm will penetrate and fertilize the female egg while it is in the Fallopian tube. One week later, the fertilized egg implants into the wall of the uterus. An embryo begins to develop into a baby. At 6 to 8 weeks, the eyes and face are formed and the heartbeat can be seen on ultrasound. At the end of 12 weeks (first trimester), all the baby's organs are formed. Now that you are pregnant, you will want to do everything you can to have a healthy baby. Two of the most important things are to get good prenatal care and follow your caregiver's instructions. Prenatal care is all the medical care you receive before the baby's birth. It is given to prevent, find, and treat problems during the pregnancy and childbirth. PRENATAL EXAMS  During prenatal visits, your weight, blood pressure, and urine are checked. This is done to make sure you are healthy and progressing normally during the pregnancy.  A pregnant woman should gain 25 to 35 pounds  during the pregnancy. However, if you are overweight or underweight, your caregiver will advise you regarding your weight.  Your caregiver will ask and answer questions for you.  Blood work, cervical cultures, other necessary tests, and a Pap test are done during your prenatal exams. These tests are done to check on your health and the probable health of your baby. Tests are strongly recommended and done for HIV with your permission. This is the virus that causes AIDS. These tests are done because medicines can be given to help prevent your baby from being born with this infection should you have been infected without knowing it. Blood work is also used to find out your blood type, previous infections, and follow your blood levels (hemoglobin).  Low hemoglobin (anemia) is common during pregnancy. Iron and vitamins are given to help prevent this. Later in the pregnancy, blood tests for diabetes will be done along with any other tests if any problems develop.  You may need other tests to make sure you and the baby are doing well. CHANGES DURING THE FIRST TRIMESTER  Your body goes through many changes during pregnancy. They vary from person to person. Talk to your caregiver about changes you notice and are concerned about. Changes can include:  Your menstrual period stops.  The egg and sperm carry the genes that determine what you look like. Genes from you   and your partner are forming a baby. The female genes determine whether the baby is a boy or a girl.  Your body increases in girth and you may feel bloated.  Feeling sick to your stomach (nauseous) and throwing up (vomiting). If the vomiting is uncontrollable, call your caregiver.  Your breasts will begin to enlarge and become tender.  Your nipples may stick out more and become darker.  The need to urinate more. Painful urination may mean you have a bladder infection.  Tiring easily.  Loss of appetite.  Cravings for certain kinds of  food.  At first, you may gain or lose a couple of pounds.  You may have changes in your emotions from day to day (excited to be pregnant or concerned something may go wrong with the pregnancy and baby).  You may have more vivid and strange dreams. HOME CARE INSTRUCTIONS   It is very important to avoid all smoking, alcohol and non-prescribed drugs during your pregnancy. These affect the formation and growth of the baby. Avoid chemicals while pregnant to ensure the delivery of a healthy infant.  Start your prenatal visits by the 12th week of pregnancy. They are usually scheduled monthly at first, then more often in the last 2 months before delivery. Keep your caregiver's appointments. Follow your caregiver's instructions regarding medicine use, blood and lab tests, exercise, and diet.  During pregnancy, you are providing food for you and your baby. Eat regular, well-balanced meals. Choose foods such as meat, fish, milk and other low fat dairy products, vegetables, fruits, and whole-grain breads and cereals. Your caregiver will tell you of the ideal weight gain.  You can help morning sickness by keeping soda crackers at the bedside. Eat a couple before arising in the morning. You may want to use the crackers without salt on them.  Eating 4 to 5 small meals rather than 3 large meals a day also may help the nausea and vomiting.  Drinking liquids between meals instead of during meals also seems to help nausea and vomiting.  A physical sexual relationship may be continued throughout pregnancy if there are no other problems. Problems may be early (premature) leaking of amniotic fluid from the membranes, vaginal bleeding, or belly (abdominal) pain.  Exercise regularly if there are no restrictions. Check with your caregiver or physical therapist if you are unsure of the safety of some of your exercises. Greater weight gain will occur in the last 2 trimesters of pregnancy. Exercising will  help:  Control your weight.  Keep you in shape.  Prepare you for labor and delivery.  Help you lose your pregnancy weight after you deliver your baby.  Wear a good support or jogging bra for breast tenderness during pregnancy. This may help if worn during sleep too.  Ask when prenatal classes are available. Begin classes when they are offered.  Do not use hot tubs, steam rooms, or saunas.  Wear your seat belt when driving. This protects you and your baby if you are in an accident.  Avoid raw meat, uncooked cheese, cat litter boxes, and soil used by cats throughout the pregnancy. These carry germs that can cause birth defects in the baby.  The first trimester is a good time to visit your dentist for your dental health. Getting your teeth cleaned is okay. Use a softer toothbrush and brush gently during pregnancy.  Ask for help if you have financial, counseling, or nutritional needs during pregnancy. Your caregiver will be able to offer counseling for   these needs as well as refer you for other special needs.  Do not take any medicines or herbs unless told by your caregiver.  Inform your caregiver if there is any mental or physical domestic violence.  Make a list of emergency phone numbers of family, friends, hospital, and police and fire departments.  Write down your questions. Take them to your prenatal visit.  Do not douche.  Do not cross your legs.  If you have to stand for long periods of time, rotate you feet or take small steps in a circle.  You may have more vaginal secretions that may require a sanitary pad. Do not use tampons or scented sanitary pads. MEDICINES AND DRUG USE IN PREGNANCY  Take prenatal vitamins as directed. The vitamin should contain 1 milligram of folic acid. Keep all vitamins out of reach of children. Only a couple vitamins or tablets containing iron may be fatal to a baby or young child when ingested.  Avoid use of all medicines, including herbs,  over-the-counter medicines, not prescribed or suggested by your caregiver. Only take over-the-counter or prescription medicines for pain, discomfort, or fever as directed by your caregiver. Do not use aspirin, ibuprofen, or naproxen unless directed by your caregiver.  Let your caregiver also know about herbs you may be using.  Alcohol is related to a number of birth defects. This includes fetal alcohol syndrome. All alcohol, in any form, should be avoided completely. Smoking will cause low birth rate and premature babies.  Street or illegal drugs are very harmful to the baby. They are absolutely forbidden. A baby born to an addicted mother will be addicted at birth. The baby will go through the same withdrawal an adult does.  Let your caregiver know about any medicines that you have to take and for what reason you take them. SEEK MEDICAL CARE IF:  You have any concerns or worries during your pregnancy. It is better to call with your questions if you feel they cannot wait, rather than worry about them. SEEK IMMEDIATE MEDICAL CARE IF:   An unexplained oral temperature above 102 F (38.9 C) develops, or as your caregiver suggests.  You have leaking of fluid from the vagina (birth canal). If leaking membranes are suspected, take your temperature and inform your caregiver of this when you call.  There is vaginal spotting or bleeding. Notify your caregiver of the amount and how many pads are used.  You develop a bad smelling vaginal discharge with a change in the color.  You continue to feel sick to your stomach (nauseated) and have no relief from remedies suggested. You vomit blood or coffee ground-like materials.  You lose more than 2 pounds of weight in 1 week.  You gain more than 2 pounds of weight in 1 week and you notice swelling of your face, hands, feet, or legs.  You gain 5 pounds or more in 1 week (even if you do not have swelling of your hands, face, legs, or feet).  You get  exposed to German measles and have never had them.  You are exposed to fifth disease or chickenpox.  You develop belly (abdominal) pain. Round ligament discomfort is a common non-cancerous (benign) cause of abdominal pain in pregnancy. Your caregiver still must evaluate this.  You develop headache, fever, diarrhea, pain with urination, or shortness of breath.  You fall or are in a car accident or have any kind of trauma.  There is mental or physical violence in your home. Document   Released: 12/03/2001 Document Revised: 09/02/2012 Document Reviewed: 06/06/2009 [employer] Patient Information 2014 [employer].  

## 2013-06-07 NOTE — Progress Notes (Signed)
  Subjective:    [name] is a 32 y.o. G5P3003 Hispanic female at [redacted]w[redacted]d by today's u/s, being seen today for her first obstetrical visit.  Her obstetrical history is significant for preterm labor w/ subsequent term svd x 3.  Pregnancy history fully reviewed. Had visit at [facility] on 6/9 d/t back pain and was thought to have Lt sided renal calculi- no imaging, rx'd norco and d/c'd to strain urine.  States she hasn't passed anything as of yet.   Patient reports daily n/v, Rt sided cramping, Lt back pain, denies fever/chills, cramping, vb. Does report dysuria, urinary frequency and hesitancy. Denies urgency.   Filed Vitals:   06/07/13 1111  BP: 122/64  Weight: 153 lb 3.2 oz (69.491 kg)    HISTORY: OB History   Grav Para Term Preterm Abortions TAB SAB Ect Mult Living   4 3 3       3      # Outc Date GA Lbr Len/2nd Wgt Sex Del Anes PTL Lv   1 TRM 2/98 [redacted]w[redacted]d  6lb4oz(2.835kg) F SVD EPI  Yes   2 TRM 9/02 [redacted]w[redacted]d  9lb2oz(4.139kg) M SVD EPI  Yes   3 TRM 1/07 [redacted]w[redacted]d  7lb8oz(3.402kg) F SVD EPI  Yes   4 CUR              Past Medical History  Diagnosis Date  . Kidney stones    Past Surgical History  Procedure Laterality Date  . Kidney stents    . Lithotripsy     Family History  Problem Relation Age of Onset  . Cancer Mother     breast  . Diabetes Maternal Grandmother      Exam    Pelvic Exam:    Perineum: Normal Perineum   Vulva: normal   Vagina:  normal mucosa, normal discharge, no palpable nodules   Uterus   normal size and contour for 6wk      Cervix: normal   Adnexa: Not palpable   Urinary: urethral meatus normal    System:     Skin: normal coloration and turgor, no rashes    Neurologic: oriented, normal mood   Extremities: normal strength, tone, and muscle mass   HEENT PERRLA   Mouth/Teeth mucous membranes moist   Cardiovascular: regular rate and rhythm   Respiratory:  appears well, vitals normal, no respiratory distress, acyanotic, normal RR   Abdomen: soft,  non-tender    Thin prep pap smear obtained w/ high risk HPV screening  FHR via u/s 115   Assessment:    Pregnancy: [id number] There are no active problems to display for this patient.     104w1d [id number]  New OB visit Possible renal calculi UTI N/V pregnancy    Plan:     Initial labs drawn Continue prenatal vitamins, Prenatal Plus #30 w/ 12RF e-scribed Problem list reviewed and updated Reviewed n/v relief measures and warning s/s to report Rx Macrobid 100mg  bid x 7d Continue to strain urine Increase po fluids Diclegis #100 w/ 4RF e-scribed and prior auth faxed Genetic Screening discussed Integrated Screen: requested Cystic fibrosis screening discussed requested Ultrasound discussed; fetal survey: requested Follow up in 4 weeks for visit  Marge Duncans 06/07/2013 11:41 AM

## 2013-06-08 LAB — URINALYSIS
Glucose, UA: NEGATIVE mg/dL
Hgb urine dipstick: NEGATIVE
Nitrite: NEGATIVE
Specific Gravity, Urine: 1.016 (ref 1.005–1.030)
pH: 7 (ref 5.0–8.0)

## 2013-06-08 LAB — DRUG SCREEN, URINE, NO CONFIRMATION
Amphetamine Screen, Ur: NEGATIVE
Barbiturate Quant, Ur: NEGATIVE
Benzodiazepines.: NEGATIVE
Cocaine Metabolites: NEGATIVE
Creatinine,U: 65.3 mg/dL
Phencyclidine (PCP): NEGATIVE

## 2013-06-08 LAB — SICKLE CELL SCREEN: Sickle Cell Screen: NEGATIVE

## 2013-06-08 LAB — OXYCODONE SCREEN, UA, RFLX CONFIRM: Oxycodone Screen, Ur: NEGATIVE ng/mL

## 2013-06-09 ENCOUNTER — Telehealth: Payer: Self-pay | Admitting: Advanced Practice Midwife

## 2013-06-09 ENCOUNTER — Emergency Department (HOSPITAL_COMMUNITY)
Admission: EM | Admit: 2013-06-09 | Discharge: 2013-06-09 | Discharge status: Against Medical Advice | Payer: Medicaid Other | Attending: Emergency Medicine | Admitting: Emergency Medicine

## 2013-06-09 ENCOUNTER — Encounter (HOSPITAL_COMMUNITY): Payer: Self-pay | Admitting: *Deleted

## 2013-06-09 DIAGNOSIS — R319 Hematuria, unspecified: Secondary | ICD-10-CM | POA: Insufficient documentation

## 2013-06-09 DIAGNOSIS — O9989 Other specified diseases and conditions complicating pregnancy, childbirth and the puerperium: Secondary | ICD-10-CM | POA: Insufficient documentation

## 2013-06-09 DIAGNOSIS — Z3490 Encounter for supervision of normal pregnancy, unspecified, unspecified trimester: Secondary | ICD-10-CM

## 2013-06-09 DIAGNOSIS — Z87442 Personal history of urinary calculi: Secondary | ICD-10-CM | POA: Insufficient documentation

## 2013-06-09 DIAGNOSIS — R300 Dysuria: Secondary | ICD-10-CM | POA: Insufficient documentation

## 2013-06-09 DIAGNOSIS — R109 Unspecified abdominal pain: Secondary | ICD-10-CM | POA: Insufficient documentation

## 2013-06-09 LAB — URINE CULTURE: Organism ID, Bacteria: NO GROWTH

## 2013-06-09 LAB — URINALYSIS, ROUTINE W REFLEX MICROSCOPIC
Bilirubin Urine: NEGATIVE
Nitrite: NEGATIVE
Specific Gravity, Urine: 1.02 (ref 1.005–1.030)
Urobilinogen, UA: 0.2 mg/dL (ref 0.0–1.0)
pH: 6.5 (ref 5.0–8.0)

## 2013-06-09 LAB — CYSTIC FIBROSIS DIAGNOSTIC STUDY

## 2013-06-09 LAB — URINE MICROSCOPIC-ADD ON

## 2013-06-09 NOTE — Telephone Encounter (Signed)
Per Dr Despina Hidden go to ER

## 2013-06-09 NOTE — ED Notes (Signed)
Attempted unsuccessfully to obtain FHT with doppler.

## 2013-06-09 NOTE — ED Notes (Signed)
Pt reports she wishes to leave, that she does not wish to be around "that doctor" and "just wants to go home".  Pt signed AMA and instructed to return for any new/worsening s/s.  A&ox4; answers questions appropriately; verbalizes understanding.

## 2013-06-09 NOTE — ED Provider Notes (Signed)
History     CSN: [id number]  Arrival date & time 06/09/13  1615   First MD Initiated Contact with Patient 06/09/13 1632      Chief Complaint  Patient presents with  . Flank Pain    (Consider location/radiation/quality/duration/timing/severity/associated sxs/prior treatment) HPI  Patient reports she is G4 P3 Ab0, EDC February 8, approximately [redacted] weeks pregnant. Patient states she was seen at her obstetrician's office 2 days ago and had a ultrasound with transvaginal ultrasound showing that she was [redacted] weeks pregnant. She states she was having left lower quadrant pain and vaginal spotting at that time. She states she has been having left flank pain radiating to her left lower quadrant for the past 2 weeks but got worse about 5 hours ago. She states she urinated and when she wiped she saw some blood and her pain got worse. She describes nausea without vomiting. She denies diarrhea. She states she has dysuria but feels like she can't urinate.  PCP Dr [facility]  Obstetrician Dr Emelda Fear  Past Medical History  Diagnosis Date  . Kidney stones   . Pregnant     Past Surgical History  Procedure Laterality Date  . Kidney stents    . Lithotripsy      Family History  Problem Relation Age of Onset  . Cancer Mother     breast  . Diabetes Maternal Grandmother     History  Substance Use Topics  . Smoking status: Never Smoker   . Smokeless tobacco: Not on file  . Alcohol Use: No  stay at home mother   OB History   Grav Para Term Preterm Abortions TAB SAB Ect Mult Living   4 3 3       3       Review of Systems  All other systems reviewed and are negative.    Allergies  Review of patient's allergies indicates no known allergies.  Home Medications   Current Outpatient Rx  Name  Route  Sig  Dispense  Refill  . Doxylamine-Pyridoxine (DICLEGIS) 10-10 MG TBEC   Oral   Take 10 mg by mouth See admin instructions.   100 tablet   4     2 tabs q hs, if sx persist add 1  tab q am on day 3 ...   . prenatal vitamin w/FE, FA (PRENATAL 1 + 1) 27-1 MG TABS   Oral   Take 1 tablet by mouth daily at 12 noon.   30 each   12     BP 122/52  Pulse 103  Temp(Src) 98.5 F (36.9 C) (Oral)  Resp 20  Ht 5\' 3"  (1.6 m)  Wt 151 lb (68.493 kg)  BMI 26.76 kg/m2  SpO2 100%  LMP 04/10/2013  Vital signs normal except tachycardia   Physical Exam  Nursing note and vitals reviewed. Constitutional: She is oriented to person, place, and time. She appears well-developed and well-nourished.  Non-toxic appearance. She does not appear ill. No distress.  HENT:  Head: Normocephalic and atraumatic.  Right Ear: External ear normal.  Left Ear: External ear normal.  Nose: Nose normal. No mucosal edema or rhinorrhea.  Mouth/Throat: Oropharynx is clear and moist and mucous membranes are normal. No dental abscesses or edematous.  Eyes: Conjunctivae and EOM are normal. Pupils are equal, round, and reactive to light.  Neck: Normal range of motion and full passive range of motion without pain. Neck supple.  Cardiovascular: Normal rate, regular rhythm and normal heart sounds.  Exam reveals no  gallop and no friction rub.   No murmur heard. Pulmonary/Chest: Effort normal and breath sounds normal. No respiratory distress. She has no wheezes. She has no rhonchi. She has no rales. She exhibits no tenderness and no crepitus.  Abdominal: Soft. Normal appearance and bowel sounds are normal. She exhibits no distension. There is no tenderness. There is no rebound and no guarding.  No CVAT on either side, nontender abdomen to palpation.   Musculoskeletal: Normal range of motion. She exhibits no edema and no tenderness.  Moves all extremities well.   Neurological: She is alert and oriented to person, place, and time. She has normal strength. No cranial nerve deficit.  Skin: Skin is warm, dry and intact. No rash noted. No erythema. No pallor.  Psychiatric: She has a normal mood and affect. Her  speech is normal and behavior is normal. Her mood appears not anxious.    ED Course  Procedures (including critical care time)  Patient has had 16 abdomen/pelvic CT scans, the last one was April 15 which showed no stones. I reviewed all her CT scans and she has never had a renal stone. This was relayed to the patient. She feels that she has developed another stone since that scan. She was advised that is not likely. I explained to her I would be glad to treat her nausea but I was not going to give her any narcotic pain medicine because I do not feel like she's having a kidney stone. She states she feels like she can't urinate and I offered to do a bladder scan on her, which she has refused. I tried to suggest that she may be having round ligament pain however patient became very irate. Stating she is concerned about her pregnancy. I told her we could do a pelvic exam and check her for bleeding however she refused. This was also done 2 days ago at her OB office. She states she is going to leave.  This is her fifth ED visit in 6 months.  Results for orders placed during the hospital encounter of 06/09/13  URINALYSIS, ROUTINE W REFLEX MICROSCOPIC      Result Value Range   Color, Urine YELLOW  YELLOW   APPearance HAZY (*) CLEAR   Specific Gravity, Urine 1.020  1.005 - 1.030   pH 6.5  5.0 - 8.0   Glucose, UA NEGATIVE  NEGATIVE mg/dL   Hgb urine dipstick LARGE (*) NEGATIVE   Bilirubin Urine NEGATIVE  NEGATIVE   Ketones, ur NEGATIVE  NEGATIVE mg/dL   Protein, ur NEGATIVE  NEGATIVE mg/dL   Urobilinogen, UA 0.2  0.0 - 1.0 mg/dL   Nitrite NEGATIVE  NEGATIVE   Leukocytes, UA TRACE (*) NEGATIVE  PREGNANCY, URINE      Result Value Range   Preg Test, Ur POSITIVE (*) NEGATIVE  URINE MICROSCOPIC-ADD ON      Result Value Range   Squamous Epithelial / LPF FEW (*) RARE   WBC, UA 0-2  <3 WBC/hpf   RBC / HPF 21-50  <3 RBC/hpf   Bacteria, UA FEW (*) RARE   Laboratory interpretation all normal except  hematuria   1. Pregnancy   2. Hematuria     Pt left AMA  Devoria Albe, MD, FACEP   MDM          Ward Givens, MD 06/09/13 1745

## 2013-06-09 NOTE — ED Notes (Signed)
Lt flank pain intermittently for 2 weeks,  Pt is [redacted] weeks pregnant. Nausea, no vomiting

## 2013-06-10 LAB — VARICELLA ZOSTER ANTIBODY, IGG: Varicella IgG: 270.3 Index — ABNORMAL HIGH (ref <135.00)

## 2013-06-10 LAB — RUBELLA SCREEN: Rubella: 1.84 Index — ABNORMAL HIGH (ref <0.90)

## 2013-06-12 ENCOUNTER — Encounter: Payer: Self-pay | Admitting: Women's Health

## 2013-06-14 ENCOUNTER — Emergency Department (HOSPITAL_COMMUNITY)
Admission: EM | Admit: 2013-06-14 | Discharge: 2013-06-14 | Disposition: A | Discharge status: Home / Self Care | Payer: Medicaid Other | Attending: Emergency Medicine | Admitting: Emergency Medicine

## 2013-06-14 ENCOUNTER — Encounter (HOSPITAL_COMMUNITY): Payer: Self-pay | Admitting: *Deleted

## 2013-06-14 DIAGNOSIS — G8929 Other chronic pain: Secondary | ICD-10-CM | POA: Insufficient documentation

## 2013-06-14 DIAGNOSIS — R319 Hematuria, unspecified: Secondary | ICD-10-CM | POA: Insufficient documentation

## 2013-06-14 DIAGNOSIS — R109 Unspecified abdominal pain: Secondary | ICD-10-CM | POA: Insufficient documentation

## 2013-06-14 DIAGNOSIS — Z87442 Personal history of urinary calculi: Secondary | ICD-10-CM | POA: Insufficient documentation

## 2013-06-14 DIAGNOSIS — O210 Mild hyperemesis gravidarum: Secondary | ICD-10-CM | POA: Insufficient documentation

## 2013-06-14 DIAGNOSIS — O9989 Other specified diseases and conditions complicating pregnancy, childbirth and the puerperium: Secondary | ICD-10-CM | POA: Insufficient documentation

## 2013-06-14 LAB — CBC WITH DIFFERENTIAL/PLATELET
Basophils Absolute: 0 10*3/uL (ref 0.0–0.1)
Eosinophils Relative: 1 % (ref 0–5)
HCT: 36.5 % (ref 36.0–46.0)
Lymphocytes Relative: 15 % (ref 12–46)
Lymphs Abs: 1.7 10*3/uL (ref 0.7–4.0)
MCH: 31 pg (ref 26.0–34.0)
MCV: 90.6 fL (ref 78.0–100.0)
Monocytes Absolute: 0.5 10*3/uL (ref 0.1–1.0)
RDW: 12.7 % (ref 11.5–15.5)
WBC: 10.7 10*3/uL — ABNORMAL HIGH (ref 4.0–10.5)

## 2013-06-14 LAB — BASIC METABOLIC PANEL
CO2: 26 mEq/L (ref 19–32)
Calcium: 9 mg/dL (ref 8.4–10.5)
Creatinine, Ser: 0.52 mg/dL (ref 0.50–1.10)
Glucose, Bld: 94 mg/dL (ref 70–99)

## 2013-06-14 LAB — URINALYSIS, ROUTINE W REFLEX MICROSCOPIC
Bilirubin Urine: NEGATIVE
Bilirubin Urine: NEGATIVE
Glucose, UA: NEGATIVE mg/dL
Hgb urine dipstick: NEGATIVE
Ketones, ur: NEGATIVE mg/dL
Protein, ur: NEGATIVE mg/dL
Protein, ur: NEGATIVE mg/dL
Urobilinogen, UA: 0.2 mg/dL (ref 0.0–1.0)

## 2013-06-14 MED ORDER — ONDANSETRON 4 MG PO TBDP
4.0000 mg | ORAL_TABLET | Freq: Once | ORAL | Status: AC
  Administered 2013-06-14: 4 mg via ORAL
  Filled 2013-06-14: qty 1

## 2013-06-14 NOTE — ED Provider Notes (Signed)
History    CSN: [id number] Arrival date & time 06/14/13  1859  First MD Initiated Contact with Patient 06/14/13 2021     Chief Complaint  Patient presents with  . Flank Pain   (Consider location/radiation/quality/duration/timing/severity/associated sxs/prior Treatment) Patient is a 32 y.o. female presenting with flank pain. The history is provided by the patient.  Flank Pain This is a chronic problem. Pertinent negatives include no chest pain, no abdominal pain, no headaches and no shortness of breath.   patient has recurrent left flank pain. She had multiple previous visits for it has had multiple CT scans. The scans have never shown stones, however she has had renal ultrasounds have shown some stones. She states that over last couple weeks she has had left-sided abdominal pain. His been like previous pain although patient did not initially tell me that she's had this pain previously. She is tender [redacted] weeks pregnant. She states she also vomited once. She states she had some blood when she wiped after urinating also. No fevers. No vaginal discharge. She has had prenatal care. Past Medical History  Diagnosis Date  . Pregnant   . Kidney stones    Past Surgical History  Procedure Laterality Date  . Kidney stents    . Lithotripsy     Family History  Problem Relation Age of Onset  . Cancer Mother     breast  . Diabetes Maternal Grandmother    History  Substance Use Topics  . Smoking status: Never Smoker   . Smokeless tobacco: Not on file  . Alcohol Use: No   OB History   Grav Para Term Preterm Abortions TAB SAB Ect Mult Living   4 3 3       3      Review of Systems  Constitutional: Negative for activity change and appetite change.  HENT: Negative for neck stiffness.   Eyes: Negative for pain.  Respiratory: Negative for chest tightness and shortness of breath.   Cardiovascular: Negative for chest pain and leg swelling.  Gastrointestinal: Positive for vomiting. Negative for  nausea, abdominal pain and diarrhea.  Genitourinary: Positive for hematuria and flank pain. Negative for vaginal bleeding, vaginal discharge and pelvic pain.  Musculoskeletal: Negative for back pain and gait problem.  Skin: Negative for pallor and rash.  Neurological: Negative for weakness, numbness and headaches.  Psychiatric/Behavioral: Negative for behavioral problems.    Allergies  Review of patient's allergies indicates no known allergies.  Home Medications   Current Outpatient Rx  Name  Route  Sig  Dispense  Refill  . Doxylamine-Pyridoxine 10-10 MG TBEC   Oral   Take 10-20 mg by mouth 2 (two) times daily. Takes two at bedtime and one in the morning         . prenatal vitamin w/FE, FA (PRENATAL 1 + 1) 27-1 MG TABS   Oral   Take 1 tablet by mouth daily at 12 noon.   30 each   12    BP 129/74  Pulse 99  Temp(Src) 97.5 F (36.4 C) (Oral)  Resp 20  Ht 5\' 3"  (1.6 m)  Wt 151 lb (68.493 kg)  BMI 26.76 kg/m2  SpO2 100%  LMP 04/10/2013 Physical Exam  Nursing note and vitals reviewed. Constitutional: She is oriented to person, place, and time. She appears well-developed and well-nourished.  HENT:  Head: Normocephalic and atraumatic.  Eyes: EOM are normal. Pupils are equal, round, and reactive to light.  Neck: Normal range of motion. Neck supple.  Cardiovascular:  Normal rate, regular rhythm and normal heart sounds.   No murmur heard. Pulmonary/Chest: Effort normal and breath sounds normal. No respiratory distress. She has no wheezes. She has no rales.  Abdominal: Soft. Bowel sounds are normal. She exhibits no distension. There is tenderness. There is no rebound and no guarding.  Minimal left-sided abdominal tenderness.  Genitourinary:  No CVA tenderness. Uterus gravid is slightly above the pelvis. No vaginal bleeding. Os is closed  Musculoskeletal: Normal range of motion.  Neurological: She is alert and oriented to person, place, and time. No cranial nerve deficit.   Skin: Skin is warm and dry.  Psychiatric: She has a normal mood and affect. Her speech is normal.    ED Course  Procedures (including critical care time) Labs Reviewed  URINALYSIS, ROUTINE W REFLEX MICROSCOPIC - Abnormal; Notable for the following:    APPearance HAZY (*)    Hgb urine dipstick LARGE (*)    Leukocytes, UA MODERATE (*)    All other components within normal limits  CBC WITH DIFFERENTIAL - Abnormal; Notable for the following:    WBC 10.7 (*)    Neutrophils Relative % 79 (*)    Neutro Abs 8.4 (*)    All other components within normal limits  BASIC METABOLIC PANEL - Abnormal; Notable for the following:    BUN 5 (*)    All other components within normal limits  URINE MICROSCOPIC-ADD ON - Abnormal; Notable for the following:    Squamous Epithelial / LPF MANY (*)    Bacteria, UA MANY (*)    All other components within normal limits  URINE CULTURE  URINALYSIS, ROUTINE W REFLEX MICROSCOPIC   No results found. 1. Chronic flank pain     MDM  Patient with flank pain. History of chronic flank pain. Has had hematuria reportedly. Said multiple CT scans of never shown the stone. She's had renal ultrasounds that do show stones. I will get outpatient ultrasound. No hematuria with cath urine. Pelvic is reassuring. Will discharge followup with urology.  Juliet Rude. Rubin Payor, MD 06/14/13 [phone]

## 2013-06-14 NOTE — ED Notes (Signed)
Pt alert & oriented x4, stable gait. Patient given discharge instructions, paperwork & prescription(s). Patient  instructed to stop at the registration desk to finish any additional paperwork. Patient verbalized understanding. Pt left department w/ no further questions. 

## 2013-06-14 NOTE — ED Notes (Signed)
Pt is pregnant [redacted] weeks, Pain lt flank , had vaginal spotting, and says she vomited with streaks of blood.   Dysuria, voiding small amts

## 2013-06-14 NOTE — ED Notes (Signed)
Pt reports flank pain on & off for weeks. States was told she kidney stones.

## 2013-06-15 ENCOUNTER — Ambulatory Visit (HOSPITAL_COMMUNITY)
Admit: 2013-06-15 | Discharge: 2013-06-15 | Disposition: A | Discharge status: Home / Self Care | Payer: Medicaid Other | Source: Ambulatory Visit | Attending: Emergency Medicine | Admitting: Emergency Medicine

## 2013-06-15 DIAGNOSIS — Z87440 Personal history of urinary (tract) infections: Secondary | ICD-10-CM | POA: Insufficient documentation

## 2013-06-15 DIAGNOSIS — O99891 Other specified diseases and conditions complicating pregnancy: Secondary | ICD-10-CM | POA: Insufficient documentation

## 2013-06-15 DIAGNOSIS — R109 Unspecified abdominal pain: Secondary | ICD-10-CM | POA: Insufficient documentation

## 2013-06-16 ENCOUNTER — Other Ambulatory Visit (HOSPITAL_COMMUNITY): Payer: Self-pay

## 2013-06-16 LAB — URINE CULTURE: Colony Count: >=100000

## 2013-06-28 ENCOUNTER — Telehealth: Payer: Self-pay | Admitting: Obstetrics and Gynecology

## 2013-06-28 NOTE — Telephone Encounter (Signed)
Burning sensation on left side, stated went to [facility] to be evaluated. Pt states everything WNL. Pt encouraged to push fluids, can take tylenol. If no improvement call office back. Pt told to go to [facility] for severe pain and vaginal bleeding. Pt verbalized understanding.

## 2013-06-29 ENCOUNTER — Inpatient Hospital Stay (HOSPITAL_COMMUNITY)
Admission: AD | Admit: 2013-06-29 | Discharge: 2013-06-30 | Disposition: A | Discharge status: Home / Self Care | Payer: Medicaid Other | Source: Ambulatory Visit | Attending: Obstetrics & Gynecology | Admitting: Obstetrics & Gynecology

## 2013-06-29 ENCOUNTER — Encounter (HOSPITAL_COMMUNITY): Payer: Self-pay | Admitting: *Deleted

## 2013-06-29 DIAGNOSIS — R1032 Left lower quadrant pain: Secondary | ICD-10-CM | POA: Insufficient documentation

## 2013-06-29 DIAGNOSIS — N83209 Unspecified ovarian cyst, unspecified side: Secondary | ICD-10-CM | POA: Insufficient documentation

## 2013-06-29 DIAGNOSIS — M549 Dorsalgia, unspecified: Secondary | ICD-10-CM | POA: Insufficient documentation

## 2013-06-29 DIAGNOSIS — O99891 Other specified diseases and conditions complicating pregnancy: Secondary | ICD-10-CM | POA: Insufficient documentation

## 2013-06-29 DIAGNOSIS — O34599 Maternal care for other abnormalities of gravid uterus, unspecified trimester: Secondary | ICD-10-CM | POA: Insufficient documentation

## 2013-06-29 DIAGNOSIS — O26899 Other specified pregnancy related conditions, unspecified trimester: Secondary | ICD-10-CM

## 2013-06-29 NOTE — MAU Note (Signed)
PT  SAYS SHE WENT TO [place]  6-24-   SHE  HAD SPOTTING-  DECIDED  SHE HAD KIDNEY STONES.   NO FURTHER SPOTTING.   ALSO HAD BACK PAIN    SAYS   AT 5PM- SHE STARTED GETTING   BACK PAIN THAT RADIATED   TO LEFT FRONT SIDE -  NOW HAS BURNING   IN LOWER ABD.    HAS CRAMPS IN VAG.-  FEELS SHARP  THEN GOES AWAY.     LAST SEX-  Friday.   SEEN AT [facility] ON 6-26,  NEXT APPOINTMENT IS 7-14.

## 2013-06-30 ENCOUNTER — Encounter (HOSPITAL_COMMUNITY): Payer: Self-pay

## 2013-06-30 ENCOUNTER — Inpatient Hospital Stay (HOSPITAL_COMMUNITY): Payer: Medicaid Other

## 2013-06-30 DIAGNOSIS — O9989 Other specified diseases and conditions complicating pregnancy, childbirth and the puerperium: Secondary | ICD-10-CM

## 2013-06-30 DIAGNOSIS — R1032 Left lower quadrant pain: Secondary | ICD-10-CM

## 2013-06-30 LAB — CBC
MCV: 88.2 fL (ref 78.0–100.0)
Platelets: 241 10*3/uL (ref 150–400)
RBC: 3.89 MIL/uL (ref 3.87–5.11)
WBC: 8.4 10*3/uL (ref 4.0–10.5)

## 2013-06-30 LAB — URINE MICROSCOPIC-ADD ON

## 2013-06-30 LAB — WET PREP, GENITAL

## 2013-06-30 LAB — URINALYSIS, ROUTINE W REFLEX MICROSCOPIC
Glucose, UA: NEGATIVE mg/dL
Hgb urine dipstick: NEGATIVE
Ketones, ur: NEGATIVE mg/dL
Protein, ur: NEGATIVE mg/dL

## 2013-06-30 MED ORDER — CYCLOBENZAPRINE HCL 10 MG PO TABS: 10.0000 mg | ORAL_TABLET | Freq: Three times a day (TID) | ORAL | Status: DC | PRN

## 2013-06-30 MED ORDER — ONDANSETRON 8 MG PO TBDP
8.0000 mg | ORAL_TABLET | Freq: Once | ORAL | Status: AC
  Administered 2013-06-30: 8 mg via ORAL
  Filled 2013-06-30: qty 1

## 2013-06-30 MED ORDER — OXYCODONE-ACETAMINOPHEN 5-325 MG PO TABS
2.0000 | ORAL_TABLET | Freq: Once | ORAL | Status: AC
  Administered 2013-06-30: 2 via ORAL
  Filled 2013-06-30: qty 2

## 2013-06-30 MED ORDER — CYCLOBENZAPRINE HCL 5 MG PO TABS
5.0000 mg | ORAL_TABLET | Freq: Once | ORAL | Status: AC
  Administered 2013-06-30: 5 mg via ORAL
  Filled 2013-06-30: qty 1

## 2013-06-30 NOTE — MAU Provider Note (Signed)
Chief Complaint: No chief complaint on file.   First Provider Initiated Contact with Patient 06/30/13 0045     SUBJECTIVE HPI: [name] is a 32 y.o. G4P3003 at [redacted]w[redacted]d by 6.1 week [place] who presents with burning, constant, worsening LLQ pain x 2 weeks. Was seen at [facility] 06/15/13 for pain (at that time it radiated from left flank to LLQ.) Has Hx of Kidney Stones. None seen on [place] at that visit.  Etiology for pain not identified. [facility]. Neg GC/CT 06/07/13. Also C/O N/V throughout pregnancy, 3-4 x per day. No change. Able to keep down POs.    Past Medical History  Diagnosis Date  . Pregnant   . Kidney stones    OB History   Grav Para Term Preterm Abortions TAB SAB Ect Mult Living   4 3 3       3      # Outc Date GA Lbr Len/2nd Wgt Sex Del Anes PTL Lv   1 TRM 2/98 [redacted]w[redacted]d  4.540JW(1XB1YN) F SVD EPI  Yes   2 TRM 9/02 [redacted]w[redacted]d  8.295AO(1HY8MV) M SVD EPI  Yes   3 TRM 1/07 [redacted]w[redacted]d  7.846NG(2XB2WU) F SVD EPI  Yes   4 CUR              Past Surgical History  Procedure Laterality Date  . Kidney stents    . Lithotripsy     History   Social History  . Marital Status: Married    Spouse Name: N/A    Number of Children: N/A  . Years of Education: N/A   Occupational History  . Not on file.   Social History Main Topics  . Smoking status: Never Smoker   . Smokeless tobacco: Not on file  . Alcohol Use: No  . Drug Use: No  . Sexually Active: Yes    Birth Control/ Protection: None   Other Topics Concern  . Not on file   Social History Narrative  . No narrative on file   No current facility-administered medications on file prior to encounter.   Current Outpatient Prescriptions on File Prior to Encounter  Medication Sig Dispense Refill  . Doxylamine-Pyridoxine 10-10 MG TBEC Take 10-20 mg by mouth 2 (two) times daily. Takes two at bedtime and one in the morning      . prenatal vitamin w/FE, FA (PRENATAL 1 + 1) 27-1 MG TABS Take 1 tablet by mouth daily at 12  noon.  30 each  12   No Known Allergies  ROS: Pertinent items in HPI  OBJECTIVE Blood pressure 121/53, pulse 86, temperature 98.2 F (36.8 C), temperature source Oral, resp. rate 18, height 5\' 2"  (1.575 m), weight 71.442 kg (157 lb 8 oz), last menstrual period 04/10/2013. GENERAL: Well-developed, well-nourished female in moderate distress.  HEENT: Normocephalic HEART: normal rate RESP: normal effort ABDOMEN: Soft, moderate left-sided and groin tenderness. No CVAT. Uterus NT.  EXTREMITIES: Nontender, no edema NEURO: Alert and oriented SPECULUM EXAM: Deferred due to recent exam. BIMANUAL: cervix closed; uterus S=D, no adnexal tenderness or masses  LAB RESULTS Results for orders placed during the hospital encounter of 06/29/13 (from the past 24 hour(s))  URINALYSIS, ROUTINE W REFLEX MICROSCOPIC     Status: Abnormal   Collection Time    06/29/13  9:57 PM      Result Value Range   Color, Urine YELLOW  YELLOW   APPearance CLEAR  CLEAR   Specific Gravity, Urine 1.010  1.005 - 1.030  pH 6.5  5.0 - 8.0   Glucose, UA NEGATIVE  NEGATIVE mg/dL   Hgb urine dipstick NEGATIVE  NEGATIVE   Bilirubin Urine NEGATIVE  NEGATIVE   Ketones, ur NEGATIVE  NEGATIVE mg/dL   Protein, ur NEGATIVE  NEGATIVE mg/dL   Urobilinogen, UA 0.2  0.0 - 1.0 mg/dL   Nitrite NEGATIVE  NEGATIVE   Leukocytes, UA SMALL (*) NEGATIVE  URINE MICROSCOPIC-ADD ON     Status: None   Collection Time    06/29/13  9:57 PM      Result Value Range   Squamous Epithelial / LPF RARE  RARE   WBC, UA 0-2  <3 WBC/hpf   RBC / HPF 0-2  <3 RBC/hpf  CBC     Status: Abnormal   Collection Time    06/30/13 12:55 AM      Result Value Range   WBC 8.4  4.0 - 10.5 K/uL   RBC 3.89  3.87 - 5.11 MIL/uL   Hemoglobin 11.6 (*) 12.0 - 15.0 g/dL   HCT 40.9 (*) 81.1 - 91.4 %   MCV 88.2  78.0 - 100.0 fL   MCH 29.8  26.0 - 34.0 pg   MCHC 33.8  30.0 - 36.0 g/dL   RDW 78.2  95.6 - 21.3 %   Platelets 241  150 - 400 K/uL    IMAGING US Ob Comp  Less 14 Wks  06/30/2013   *RADIOLOGY REPORT*  Clinical Data: LLQ pain. 9.3 weeks by 6 week [place].,  OBSTETRIC <14 WK [place]  Technique: Transabdominal ultrasound examination was performed for complete evaluation of the gestation as well as the maternal uterus, adnexal regions, and pelvic cul-de-sac.  Comparison: None.  Findings: There is a single intrauterine gestation.  Based on crown- rump length, estimated gestational age is 9 weeks 6 days.  Fetal heart rate 181 beats per minute.  No subchorionic hemorrhage.  2.9 cm simple cyst in the right ovary.  Left ovary unremarkable. No adnexal masses.  No free fluid.  IMPRESSION: 9-week-6-day intrauterine pregnancy with a fetal heart rate of 181 beats per minute.  2.9 cm simple appearing right ovarian cyst.   Original Report Authenticated By: Charlett Nose, M.D.   US Ob Transvaginal  06/26/2013   DATING AND VIABILITY SONOGRAM   [name] is a 32 y.o. year old G51P3003 with previous u/s EDD of  01/27/2014 (by GS only) which would correlate to  [redacted]w[redacted]d weeks gestation. She  is here today for a confirmatory sonogram.    GESTATION: SINGLETON     FETAL ACTIVITY:          Heart rate         115 BPM    CERVIX: Long and closed  ADNEXA: The ovaries are WNL with Rt 3.3cm cyst (slight increase since prev. scan).   GESTATIONAL AGE AND  BIOMETRICS:  Gestational criteria: Estimated Date of Delivery: 01/30/14 by early  ultrasound now at [redacted]w[redacted]d  Previous Scans:1  GESTATIONAL SAC            mm          weeks  CROWN RUMP LENGTH           5 mm         6+1 weeks  AVERAGE EGA(BY THIS SCAN):   6+1 weeks  WORKING EDD( early ultrasound ):  01/30/2014 (by CRL )     TECHNICIAN COMMENTS:  Single IUP with +FCA noted, retroverted uterus    S. Kizzie Bane, RDMS   A copy of this report including all images has been saved and backed up to  a second source for retrieval if needed. All measures and details of the  anatomical scan, placentation,  fluid volume and pelvic anatomy are  contained in that report.  Chari Manning  Clinical Impression and recommendations:  I have reviewed the sonogram results above, combined with the patient's  current clinical course, below are my impressions and any appropriate  recommendations for management based on the sonographic findings.  1.  [id number] Estimated Date of Delivery: 01/30/14 by today's sonographic  dating 2.  Normal fetal sonographic findings 3.  Normal general sonographic findings, 3.3 cm corpus luteum cyst  Recommend routine prenatal care based on this sonogram or as clinically  indicated  EURE,LUTHER H 06/26/2013 6:37 PM         US Ob Transvaginal  06/21/2013   DATING AND VIABILITY SONOGRAM   [name] is a 32 y.o. year old G61P3003 with LMP 04/10/2013 which  would correlate to  [redacted]w[redacted]d weeks gestation.  She is here today for a  confirmatory initial sonogram.    GESTATION: SINGLETON    Gestational Sac noted  FETAL ACTIVITY:                      CERVIX: Long and closed  ADNEXA: Rt ovary with 25mm Corpus Luteum noted, Lt Ov with 19 mm thick walled  cystic area, both avascualr   GESTATIONAL AGE AND  BIOMETRICS:  Gestational criteria: Estimated Date of Delivery: 01/17/2014 by LMP now at  [redacted]w[redacted]d  Previous Scans:0  GESTATIONAL SAC           7.6 mm         5+4 weeks                                                                                   AVERAGE EGA(BY THIS SCAN):   5+4 weeks  WORKING EDD( best clinical estimate ):  01/27/2014 by Gestational Sac only     TECHNICIAN COMMENTS:  Single gestational sac, yolk sac normal size/shape, GS @ [redacted]w[redacted]d EDC 01/27/2014  no embryo visualized ?too early   G. Whitley, RDMS    A copy of this report including all images has been saved and backed up to  a second source for retrieval if needed. All measures and details of the  anatomical scan, placentation, fluid volume and pelvic anatomy are  contained in that report.  Chari Manning 05/31/2013  1.  Definite IUP but  Too early to  evaluate viability 2.  LMP dates are discrepant from today's sonogram finding  Recommend repeat sonogram in 2 weeks  EURE,LUTHER H 06/21/2013 2:18 PM    US Renal  06/15/2013   *RADIOLOGY REPORT*  Clinical Data: Bilateral flank pain.  The patient is approximately [redacted] weeks pregnant.  Prior history of urinary tract infections and urinary tract calculi.  RENAL/URINARY TRACT ULTRASOUND  COMPLETE  Comparison:  CT abdomen and pelvis 04/06/2013, 01/01/2013, 09/16/2011.  Findings:  Right Kidney:  No hydronephrosis.  Well-preserved cortex.  No shadowing calculi.  Normal size and parenchymal echotexture without focal abnormalities.  Well visualized hypoechoic pyramids in the upper pole mimics a cyst.  Approximately 11.6 cm in length.  Left Kidney:  No hydronephrosis.  Well-preserved cortex.  No shadowing calculi.  Normal size and parenchymal echotexture without focal abnormalities.  Approximately 12.3 cm in length.  Bladder:  Decompressed and unremarkable.  IMPRESSION: Normal examination.   Original Report Authenticated By: Hulan Saas, M.D.    MAU COURSE Pain resolved w/ Percocet and Flexeril.  Consulted w/ Dr. Debroah Loop. Agree w/ POS. No new orders.   ASSESSMENT 1. Pregnancy with abdominal pain of left lower quadrant, antepartum   Musculoskeletal pain vs round ligament pain.  PLAN Discharge home in stable condition.  Comfort measures. Follow-up Information   Follow up with [facility] On 07/05/2013.   Contact information:   [address] [place] [address] [phone]      Follow up with [facility]. (As needed in emergencies)    Contact information:   [address] [id number] [place] [address] [phone]        Medication List         cyclobenzaprine 10 MG tablet  Commonly known as:  FLEXERIL  Take 1 tablet (10 mg total) by mouth 3 (three) times daily as needed for muscle spasms.     Doxylamine-Pyridoxine  10-10 MG Tbec  Take 10-20 mg by mouth 2 (two) times daily. Takes two at bedtime and one in the morning     prenatal vitamin w/FE, FA 27-1 MG Tabs  Take 1 tablet by mouth daily at 12 noon.       [place], CNM 06/30/2013  1:33 AM

## 2013-07-01 ENCOUNTER — Other Ambulatory Visit: Payer: Self-pay | Admitting: Women's Health

## 2013-07-01 DIAGNOSIS — Z3481 Encounter for supervision of other normal pregnancy, first trimester: Secondary | ICD-10-CM

## 2013-07-05 ENCOUNTER — Encounter: Payer: Self-pay | Admitting: Obstetrics & Gynecology

## 2013-07-05 ENCOUNTER — Ambulatory Visit: Payer: Self-pay

## 2013-07-05 ENCOUNTER — Ambulatory Visit (INDEPENDENT_AMBULATORY_CARE_PROVIDER_SITE_OTHER): Payer: Medicaid Other | Admitting: Obstetrics & Gynecology

## 2013-07-05 ENCOUNTER — Telehealth: Payer: Self-pay | Admitting: Obstetrics & Gynecology

## 2013-07-05 VITALS — BP 110/70 | Wt 158.0 lb

## 2013-07-05 DIAGNOSIS — Z3481 Encounter for supervision of other normal pregnancy, first trimester: Secondary | ICD-10-CM

## 2013-07-05 DIAGNOSIS — O2390 Unspecified genitourinary tract infection in pregnancy, unspecified trimester: Secondary | ICD-10-CM

## 2013-07-05 DIAGNOSIS — O99019 Anemia complicating pregnancy, unspecified trimester: Secondary | ICD-10-CM

## 2013-07-05 DIAGNOSIS — O210 Mild hyperemesis gravidarum: Secondary | ICD-10-CM

## 2013-07-05 NOTE — Progress Notes (Signed)
No complaints

## 2013-07-05 NOTE — Patient Instructions (Addendum)

## 2013-07-05 NOTE — Progress Notes (Signed)
Pain on left side.

## 2013-07-05 NOTE — Telephone Encounter (Signed)
No pain meds for ligament pain

## 2013-07-06 ENCOUNTER — Telehealth: Payer: Self-pay | Admitting: Obstetrics and Gynecology

## 2013-07-06 NOTE — Telephone Encounter (Signed)
Spoke with pt. Was seen yesterday by Dr. Despina Hidden. Having round ligament pain and is requesting pain meds. Advised they would not call in pain meds for this but recommended Tylenol prn. Pt voiced understanding. JSY

## 2013-07-13 ENCOUNTER — Telehealth: Payer: Self-pay | Admitting: Obstetrics and Gynecology

## 2013-07-13 NOTE — Telephone Encounter (Signed)
C/o pain in back radiating to side. Pt states noticing a tinge of red when she wipes. Taking ibuprofen with no improvement. Pt told to go to [facility] to be evaluated. Pt verbalized understanding.

## 2013-07-14 ENCOUNTER — Encounter: Payer: Medicaid Other | Admitting: Adult Health

## 2013-07-19 ENCOUNTER — Other Ambulatory Visit: Payer: Medicaid Other

## 2013-07-19 ENCOUNTER — Encounter: Payer: Medicaid Other | Admitting: Women's Health

## 2013-07-19 ENCOUNTER — Encounter: Payer: Self-pay | Admitting: *Deleted

## 2013-07-20 ENCOUNTER — Inpatient Hospital Stay (HOSPITAL_COMMUNITY)
Admission: AD | Admit: 2013-07-20 | Discharge: 2013-07-21 | Disposition: A | Discharge status: Home / Self Care | Payer: Medicaid Other | Source: Ambulatory Visit | Attending: Obstetrics and Gynecology | Admitting: Obstetrics and Gynecology

## 2013-07-20 ENCOUNTER — Encounter (HOSPITAL_COMMUNITY): Payer: Self-pay | Admitting: *Deleted

## 2013-07-20 DIAGNOSIS — M549 Dorsalgia, unspecified: Secondary | ICD-10-CM

## 2013-07-20 DIAGNOSIS — R51 Headache: Secondary | ICD-10-CM | POA: Insufficient documentation

## 2013-07-20 DIAGNOSIS — R109 Unspecified abdominal pain: Secondary | ICD-10-CM | POA: Insufficient documentation

## 2013-07-20 DIAGNOSIS — O210 Mild hyperemesis gravidarum: Secondary | ICD-10-CM | POA: Insufficient documentation

## 2013-07-20 LAB — URINE MICROSCOPIC-ADD ON

## 2013-07-20 LAB — CBC
MCH: 29.7 pg (ref 26.0–34.0)
MCHC: 33.8 g/dL (ref 30.0–36.0)
MCV: 87.8 fL (ref 78.0–100.0)
Platelets: 248 10*3/uL (ref 150–400)
RDW: 12.7 % (ref 11.5–15.5)
WBC: 10.7 10*3/uL — ABNORMAL HIGH (ref 4.0–10.5)

## 2013-07-20 LAB — URINALYSIS, ROUTINE W REFLEX MICROSCOPIC
Glucose, UA: NEGATIVE mg/dL
Leukocytes, UA: NEGATIVE
Specific Gravity, Urine: 1.025 (ref 1.005–1.030)
pH: 6 (ref 5.0–8.0)

## 2013-07-20 LAB — WET PREP, GENITAL

## 2013-07-20 MED ORDER — ONDANSETRON 8 MG PO TBDP
8.0000 mg | ORAL_TABLET | Freq: Once | ORAL | Status: AC
  Administered 2013-07-20: 8 mg via ORAL
  Filled 2013-07-20: qty 1

## 2013-07-20 NOTE — Progress Notes (Signed)
Pt states she has had a headache since Sunday night

## 2013-07-20 NOTE — Progress Notes (Signed)
Pt describes pain as a rushing pain thru the back of her neck

## 2013-07-20 NOTE — MAU Provider Note (Signed)
History     CSN: [id number]  Arrival date and time: 07/20/13 2153   None     Chief Complaint  Patient presents with  . Emesis  . Vaginal Bleeding   Emesis   Vaginal Bleeding Associated symptoms include vomiting.    [name] is a 32 y.o. G4P3003 at [redacted]w[redacted]d who presents today with lower abdominal pain, nausea and vomiting and vaginal spotting. She states that she had one episode of red spotting while wiping earlier today and none since. She also points to the area of the round ligaments, and states that is the location of the pain.   She states that she was seen at [facility] last week and sent home for possible kidney stones. They did not see any on ultrasound. She states that she had a temp of 101 at home today and she took tylenol 6 hours ago. Her temp has not returned.   Past Medical History  Diagnosis Date  . Pregnant   . Kidney stones     Past Surgical History  Procedure Laterality Date  . Kidney stents    . Lithotripsy      Family History  Problem Relation Age of Onset  . Cancer Mother     breast  . Diabetes Maternal Grandmother     History  Substance Use Topics  . Smoking status: Never Smoker   . Smokeless tobacco: Not on file  . Alcohol Use: No    Allergies: No Known Allergies  Prescriptions prior to admission  Medication Sig Dispense Refill  . cyclobenzaprine (FLEXERIL) 10 MG tablet Take 1 tablet (10 mg total) by mouth 3 (three) times daily as needed for muscle spasms.  30 tablet  0  . Doxylamine-Pyridoxine 10-10 MG TBEC Take 10-20 mg by mouth 2 (two) times daily. Takes two at bedtime and one in the morning      . prenatal vitamin w/FE, FA (PRENATAL 1 + 1) 27-1 MG TABS Take 1 tablet by mouth daily at 12 noon.  30 each  12    Review of Systems  Gastrointestinal: Positive for vomiting.  Genitourinary: Positive for vaginal bleeding.   Physical Exam   Last menstrual period 04/10/2013.  Physical Exam  Nursing note and vitals  reviewed. Constitutional: She is oriented to person, place, and time. She appears well-developed and well-nourished. No distress.  Respiratory: Effort normal.  GI: Soft. There is no tenderness.  Genitourinary:  NO CVA tenderness  External: no lesion Vagina: small amount of white discharge Cervix: pink, smooth, no CMT Uterus: NSSC Adnexa: NT   Neurological: She is alert and oriented to person, place, and time.  Skin: Skin is warm and dry.  Psychiatric: She has a normal mood and affect.    MAU Course  Procedures  Results for orders placed during the hospital encounter of 07/20/13 (from the past 24 hour(s))  URINALYSIS, ROUTINE W REFLEX MICROSCOPIC     Status: Abnormal   Collection Time    07/20/13 10:45 PM      Result Value Range   Color, Urine YELLOW  YELLOW   APPearance CLEAR  CLEAR   Specific Gravity, Urine 1.025  1.005 - 1.030   pH 6.0  5.0 - 8.0   Glucose, UA NEGATIVE  NEGATIVE mg/dL   Hgb urine dipstick LARGE (*) NEGATIVE   Bilirubin Urine NEGATIVE  NEGATIVE   Ketones, ur NEGATIVE  NEGATIVE mg/dL   Protein, ur NEGATIVE  NEGATIVE mg/dL   Urobilinogen, UA 0.2  0.0 - 1.0 mg/dL  Nitrite NEGATIVE  NEGATIVE   Leukocytes, UA NEGATIVE  NEGATIVE  URINE MICROSCOPIC-ADD ON     Status: Abnormal   Collection Time    07/20/13 10:45 PM      Result Value Range   Squamous Epithelial / LPF FEW (*) RARE   WBC, UA 0-2  <3 WBC/hpf   RBC / HPF TOO NUMEROUS TO COUNT  <3 RBC/hpf   Bacteria, UA FEW (*) RARE   Crystals CA OXALATE CRYSTALS (*) NEGATIVE  WET PREP, GENITAL     Status: Abnormal   Collection Time    07/20/13 11:05 PM      Result Value Range   Yeast Wet Prep HPF POC NONE SEEN  NONE SEEN   Trich, Wet Prep NONE SEEN  NONE SEEN   Clue Cells Wet Prep HPF POC FEW (*) NONE SEEN   WBC, Wet Prep HPF POC FEW (*) NONE SEEN  CBC     Status: Abnormal   Collection Time    07/20/13 11:10 PM      Result Value Range   WBC 10.7 (*) 4.0 - 10.5 K/uL   RBC 3.77 (*) 3.87 - 5.11 MIL/uL    Hemoglobin 11.2 (*) 12.0 - 15.0 g/dL   HCT 14.7 (*) 82.9 - 56.2 %   MCV 87.8  78.0 - 100.0 fL   MCH 29.7  26.0 - 34.0 pg   MCHC 33.8  30.0 - 36.0 g/dL   RDW 13.0  86.5 - 78.4 %   Platelets 248  150 - 400 K/uL     Assessment and Plan  Round ligament pain Nausea and vomiting in pregnancy FU with FT as planned Second trimester danger signs reviewed Return to MAU as needed   Tawnya Crook 07/20/2013, 10:57 PM

## 2013-07-20 NOTE — MAU Note (Signed)
Pt states she had been admitted to [facility] complaining  Of nausea and abdominal pain

## 2013-07-21 DIAGNOSIS — O9989 Other specified diseases and conditions complicating pregnancy, childbirth and the puerperium: Secondary | ICD-10-CM

## 2013-07-21 MED ORDER — ONDANSETRON 8 MG PO TBDP: 8.0000 mg | ORAL_TABLET | Freq: Three times a day (TID) | ORAL | Status: DC | PRN

## 2013-07-21 MED ORDER — HYDROCODONE-ACETAMINOPHEN 5-325 MG PO TABS
2.0000 | ORAL_TABLET | Freq: Once | ORAL | Status: AC
  Administered 2013-07-21: 2 via ORAL
  Filled 2013-07-21: qty 2

## 2013-07-21 MED ORDER — PROMETHAZINE HCL 12.5 MG PO TABS: 12.5000 mg | ORAL_TABLET | Freq: Four times a day (QID) | ORAL | Status: DC | PRN

## 2013-07-22 ENCOUNTER — Encounter: Payer: Self-pay | Admitting: Advanced Practice Midwife

## 2013-07-22 ENCOUNTER — Ambulatory Visit (INDEPENDENT_AMBULATORY_CARE_PROVIDER_SITE_OTHER): Payer: Medicaid Other | Admitting: Advanced Practice Midwife

## 2013-07-22 VITALS — BP 110/80 | Wt 163.0 lb

## 2013-07-22 DIAGNOSIS — Z1389 Encounter for screening for other disorder: Secondary | ICD-10-CM

## 2013-07-22 DIAGNOSIS — O99019 Anemia complicating pregnancy, unspecified trimester: Secondary | ICD-10-CM

## 2013-07-22 DIAGNOSIS — Z331 Pregnant state, incidental: Secondary | ICD-10-CM

## 2013-07-22 LAB — POCT URINALYSIS DIPSTICK
Blood, UA: 3
Glucose, UA: NEGATIVE

## 2013-07-22 NOTE — Progress Notes (Signed)
Having some spotting when she wipes. SSE:  No blood whatsoever.  Dx with kidney stone on Sunday at [facility].  Still has blood in urine and some L flank pain.  DOing ok.  F/U as scheduled

## 2013-07-23 ENCOUNTER — Other Ambulatory Visit: Payer: Self-pay | Admitting: Obstetrics and Gynecology

## 2013-07-23 ENCOUNTER — Ambulatory Visit (INDEPENDENT_AMBULATORY_CARE_PROVIDER_SITE_OTHER): Payer: Medicaid Other

## 2013-07-23 DIAGNOSIS — Z36 Encounter for antenatal screening of mother: Secondary | ICD-10-CM

## 2013-07-23 DIAGNOSIS — Z3481 Encounter for supervision of other normal pregnancy, first trimester: Secondary | ICD-10-CM

## 2013-07-23 NOTE — Progress Notes (Signed)
U/S(13+1wks)-active fetus, CRL c/w dates, cx long and closed, NB present, NT=1.46mm, FHR=160 BPM, Rt ov with 2.9cm C.L. Cyst remains, Lt ov WNL

## 2013-07-26 NOTE — MAU Provider Note (Signed)
Attestation of Attending Supervision of Advanced Practitioner (CNM/NP): Evaluation and management procedures were performed by the Advanced Practitioner under my supervision and collaboration.  I have reviewed the Advanced Practitioner's note and chart, and I agree with the management and plan.  Addalynne Golding 07/26/2013 1:20 PM

## 2013-08-12 ENCOUNTER — Encounter: Payer: Self-pay | Admitting: Obstetrics & Gynecology

## 2013-08-12 ENCOUNTER — Ambulatory Visit (INDEPENDENT_AMBULATORY_CARE_PROVIDER_SITE_OTHER): Payer: Medicaid Other | Admitting: Obstetrics & Gynecology

## 2013-08-12 VITALS — BP 100/60 | Wt 167.0 lb

## 2013-08-12 DIAGNOSIS — O99019 Anemia complicating pregnancy, unspecified trimester: Secondary | ICD-10-CM

## 2013-08-12 DIAGNOSIS — Z331 Pregnant state, incidental: Secondary | ICD-10-CM

## 2013-08-12 DIAGNOSIS — Z1389 Encounter for screening for other disorder: Secondary | ICD-10-CM

## 2013-08-12 LAB — POCT URINALYSIS DIPSTICK
Ketones, UA: NEGATIVE
Leukocytes, UA: NEGATIVE

## 2013-08-12 MED ORDER — OMEPRAZOLE 20 MG PO CPDR: 20.0000 mg | DELAYED_RELEASE_CAPSULE | Freq: Every day | ORAL | Status: DC

## 2013-08-12 NOTE — Patient Instructions (Signed)
Pregnancy - Second Trimester The second trimester of pregnancy (3 to 6 months) is a period of rapid growth for you and your baby. At the end of the sixth month, your baby is about 9 inches long and weighs 1 1/2 pounds. You will begin to feel the baby move between 18 and 20 weeks of the pregnancy. This is called quickening. Weight gain is faster. A clear fluid (colostrum) may leak out of your breasts. You may feel small contractions of the womb (uterus). This is known as false labor or Braxton-Hicks contractions. This is like a practice for labor when the baby is ready to be born. Usually, the problems with morning sickness have usually passed by the end of your first trimester. Some women develop small dark blotches (called cholasma, mask of pregnancy) on their face that usually goes away after the baby is born. Exposure to the sun makes the blotches worse. Acne may also develop in some pregnant women and pregnant women who have acne, may find that it goes away. PRENATAL EXAMS  Blood work may continue to be done during prenatal exams. These tests are done to check on your health and the probable health of your baby. Blood work is used to follow your blood levels (hemoglobin). Anemia (low hemoglobin) is common during pregnancy. Iron and vitamins are given to help prevent this. You will also be checked for diabetes between 24 and 28 weeks of the pregnancy. Some of the previous blood tests may be repeated.  The size of the uterus is measured during each visit. This is to make sure that the baby is continuing to grow properly according to the dates of the pregnancy.  Your blood pressure is checked every prenatal visit. This is to make sure you are not getting toxemia.  Your urine is checked to make sure you do not have an infection, diabetes or protein in the urine.  Your weight is checked often to make sure gains are happening at the suggested rate. This is to ensure that both you and your baby are  growing normally.  Sometimes, an ultrasound is performed to confirm the proper growth and development of the baby. This is a test which bounces harmless sound waves off the baby so your caregiver can more accurately determine due dates. Sometimes, a test is done on the amniotic fluid surrounding the baby. This test is called an amniocentesis. The amniotic fluid is obtained by sticking a needle into the belly (abdomen). This is done to check the chromosomes in instances where there is a concern about possible genetic problems with the baby. It is also sometimes done near the end of pregnancy if an early delivery is required. In this case, it is done to help make sure the baby's lungs are mature enough for the baby to live outside of the womb. CHANGES OCCURING IN THE SECOND TRIMESTER OF PREGNANCY Your body goes through many changes during pregnancy. They vary from person to person. Talk to your caregiver about changes you notice that you are concerned about.  During the second trimester, you will likely have an increase in your appetite. It is normal to have cravings for certain foods. This varies from person to person and pregnancy to pregnancy.  Your lower abdomen will begin to bulge.  You may have to urinate more often because the uterus and baby are pressing on your bladder. It is also common to get more bladder infections during pregnancy. You can help this by drinking lots of fluids   and emptying your bladder before and after intercourse.  You may begin to get stretch marks on your hips, abdomen, and breasts. These are normal changes in the body during pregnancy. There are no exercises or medicines to take that prevent this change.  You may begin to develop swollen and bulging veins (varicose veins) in your legs. Wearing support hose, elevating your feet for 15 minutes, 3 to 4 times a day and limiting salt in your diet helps lessen the problem.  Heartburn may develop as the uterus grows and  pushes up against the stomach. Antacids recommended by your caregiver helps with this problem. Also, eating smaller meals 4 to 5 times a day helps.  Constipation can be treated with a stool softener or adding bulk to your diet. Drinking lots of fluids, and eating vegetables, fruits, and whole grains are helpful.  Exercising is also helpful. If you have been very active up until your pregnancy, most of these activities can be continued during your pregnancy. If you have been less active, it is helpful to start an exercise program such as walking.  Hemorrhoids may develop at the end of the second trimester. Warm sitz baths and hemorrhoid cream recommended by your caregiver helps hemorrhoid problems.  Backaches may develop during this time of your pregnancy. Avoid heavy lifting, wear low heal shoes, and practice good posture to help with backache problems.  Some pregnant women develop tingling and numbness of their hand and fingers because of swelling and tightening of ligaments in the wrist (carpel tunnel syndrome). This goes away after the baby is born.  As your breasts enlarge, you may have to get a bigger bra. Get a comfortable, cotton, support bra. Do not get a nursing bra until the last month of the pregnancy if you will be nursing the baby.  You may get a dark line from your belly button to the pubic area called the linea nigra.  You may develop rosy cheeks because of increase blood flow to the face.  You may develop spider looking lines of the face, neck, arms, and chest. These go away after the baby is born. HOME CARE INSTRUCTIONS   It is extremely important to avoid all smoking, herbs, alcohol, and unprescribed drugs during your pregnancy. These chemicals affect the formation and growth of the baby. Avoid these chemicals throughout the pregnancy to ensure the delivery of a healthy infant.  Most of your home care instructions are the same as suggested for the first trimester of your  pregnancy. Keep your caregiver's appointments. Follow your caregiver's instructions regarding medicine use, exercise, and diet.  During pregnancy, you are providing food for you and your baby. Continue to eat regular, well-balanced meals. Choose foods such as meat, fish, milk and other low fat dairy products, vegetables, fruits, and whole-grain breads and cereals. Your caregiver will tell you of the ideal weight gain.  A physical sexual relationship may be continued up until near the end of pregnancy if there are no other problems. Problems could include early (premature) leaking of amniotic fluid from the membranes, vaginal bleeding, abdominal pain, or other medical or pregnancy problems.  Exercise regularly if there are no restrictions. Check with your caregiver if you are unsure of the safety of some of your exercises. The greatest weight gain will occur in the last 2 trimesters of pregnancy. Exercise will help you:  Control your weight.  Get you in shape for labor and delivery.  Lose weight after you have the baby.  Wear   a good support or jogging bra for breast tenderness during pregnancy. This may help if worn during sleep. Pads or tissues may be used in the bra if you are leaking colostrum.  Do not use hot tubs, steam rooms or saunas throughout the pregnancy.  Wear your seat belt at all times when driving. This protects you and your baby if you are in an accident.  Avoid raw meat, uncooked cheese, cat litter boxes, and soil used by cats. These carry germs that can cause birth defects in the baby.  The second trimester is also a good time to visit your dentist for your dental health if this has not been done yet. Getting your teeth cleaned is okay. Use a soft toothbrush. Brush gently during pregnancy.  It is easier to leak urine during pregnancy. Tightening up and strengthening the pelvic muscles will help with this problem. Practice stopping your urination while you are going to the  bathroom. These are the same muscles you need to strengthen. It is also the muscles you would use as if you were trying to stop from passing gas. You can practice tightening these muscles up 10 times a set and repeating this about 3 times per day. Once you know what muscles to tighten up, do not perform these exercises during urination. It is more likely to contribute to an infection by backing up the urine.  Ask for help if you have financial, counseling, or nutritional needs during pregnancy. Your caregiver will be able to offer counseling for these needs as well as refer you for other special needs.  Your skin may become oily. If so, wash your face with mild soap, use non-greasy moisturizer and oil or cream based makeup. MEDICINES AND DRUG USE IN PREGNANCY  Take prenatal vitamins as directed. The vitamin should contain 1 milligram of folic acid. Keep all vitamins out of reach of children. Only a couple vitamins or tablets containing iron may be fatal to a baby or young child when ingested.  Avoid use of all medicines, including herbs, over-the-counter medicines, not prescribed or suggested by your caregiver. Only take over-the-counter or prescription medicines for pain, discomfort, or fever as directed by your caregiver. Do not use aspirin.  Let your caregiver also know about herbs you may be using.  Alcohol is related to a number of birth defects. This includes fetal alcohol syndrome. All alcohol, in any form, should be avoided completely. Smoking will cause low birth rate and premature babies.  Street or illegal drugs are very harmful to the baby. They are absolutely forbidden. A baby born to an addicted mother will be addicted at birth. The baby will go through the same withdrawal an adult does. SEEK MEDICAL CARE IF:  You have any concerns or worries during your pregnancy. It is better to call with your questions if you feel they cannot wait, rather than worry about them. SEEK IMMEDIATE  MEDICAL CARE IF:   An unexplained oral temperature above 102 F (38.9 C) develops, or as your caregiver suggests.  You have leaking of fluid from the vagina (birth canal). If leaking membranes are suspected, take your temperature and tell your caregiver of this when you call.  There is vaginal spotting, bleeding, or passing clots. Tell your caregiver of the amount and how many pads are used. Light spotting in pregnancy is common, especially following intercourse.  You develop a bad smelling vaginal discharge with a change in the color from clear to white.  You continue to feel   sick to your stomach (nauseated) and have no relief from remedies suggested. You vomit blood or coffee ground-like materials.  You lose more than 2 pounds of weight or gain more than 2 pounds of weight over 1 week, or as suggested by your caregiver.  You notice swelling of your face, hands, feet, or legs.  You get exposed to German measles and have never had them.  You are exposed to fifth disease or chickenpox.  You develop belly (abdominal) pain. Round ligament discomfort is a common non-cancerous (benign) cause of abdominal pain in pregnancy. Your caregiver still must evaluate you.  You develop a bad headache that does not go away.  You develop fever, diarrhea, pain with urination, or shortness of breath.  You develop visual problems, blurry, or double vision.  You fall or are in a car accident or any kind of trauma.  There is mental or physical violence at home. Document Released: 12/03/2001 Document Revised: 09/02/2012 Document Reviewed: 06/07/2009 [employer] Patient Information 2014 [employer].  

## 2013-08-12 NOTE — Progress Notes (Signed)
C/C Heartburns. Having constipation.

## 2013-08-12 NOTE — Progress Notes (Signed)
BP weight and urine results all reviewed and noted. Patient reports good fetal movement, denies any bleeding and no rupture of membranes symptoms or regular contractions. Patient is without complaints. All questions were answered.  

## 2013-08-15 DIAGNOSIS — R112 Nausea with vomiting, unspecified: Secondary | ICD-10-CM | POA: Insufficient documentation

## 2013-08-15 DIAGNOSIS — R1011 Right upper quadrant pain: Secondary | ICD-10-CM | POA: Insufficient documentation

## 2013-08-15 DIAGNOSIS — M549 Dorsalgia, unspecified: Principal | ICD-10-CM | POA: Insufficient documentation

## 2013-08-16 ENCOUNTER — Observation Stay (HOSPITAL_COMMUNITY): Payer: Medicaid Other

## 2013-08-16 ENCOUNTER — Observation Stay (HOSPITAL_COMMUNITY)
Admission: EM | Admit: 2013-08-16 | Discharge: 2013-08-16 | Disposition: A | Discharge status: Home / Self Care | Payer: Medicaid Other | Attending: Obstetrics & Gynecology | Admitting: Obstetrics & Gynecology

## 2013-08-16 ENCOUNTER — Encounter (HOSPITAL_COMMUNITY): Payer: Self-pay

## 2013-08-16 DIAGNOSIS — R1031 Right lower quadrant pain: Secondary | ICD-10-CM

## 2013-08-16 DIAGNOSIS — Z331 Pregnant state, incidental: Secondary | ICD-10-CM

## 2013-08-16 DIAGNOSIS — R112 Nausea with vomiting, unspecified: Secondary | ICD-10-CM

## 2013-08-16 DIAGNOSIS — R1115 Cyclical vomiting syndrome unrelated to migraine: Secondary | ICD-10-CM

## 2013-08-16 DIAGNOSIS — R109 Unspecified abdominal pain: Secondary | ICD-10-CM

## 2013-08-16 DIAGNOSIS — M549 Dorsalgia, unspecified: Secondary | ICD-10-CM

## 2013-08-16 LAB — COMPREHENSIVE METABOLIC PANEL
Albumin: 2.9 g/dL — ABNORMAL LOW (ref 3.5–5.2)
Alkaline Phosphatase: 60 U/L (ref 39–117)
BUN: 4 mg/dL — ABNORMAL LOW (ref 6–23)
Chloride: 100 mEq/L (ref 96–112)
Glucose, Bld: 122 mg/dL — ABNORMAL HIGH (ref 70–99)
Potassium: 3.2 mEq/L — ABNORMAL LOW (ref 3.5–5.1)
Total Bilirubin: 0.1 mg/dL — ABNORMAL LOW (ref 0.3–1.2)

## 2013-08-16 LAB — GLUCOSE, CAPILLARY

## 2013-08-16 LAB — URINE MICROSCOPIC-ADD ON

## 2013-08-16 LAB — URINALYSIS, ROUTINE W REFLEX MICROSCOPIC
Nitrite: NEGATIVE
Specific Gravity, Urine: 1.02 (ref 1.005–1.030)
pH: 6.5 (ref 5.0–8.0)

## 2013-08-16 LAB — CBC WITH DIFFERENTIAL/PLATELET
Basophils Relative: 0 % (ref 0–1)
Hemoglobin: 11.6 g/dL — ABNORMAL LOW (ref 12.0–15.0)
Lymphs Abs: 2.1 10*3/uL (ref 0.7–4.0)
Monocytes Relative: 7 % (ref 3–12)
Neutro Abs: 7 10*3/uL (ref 1.7–7.7)
Neutrophils Relative %: 70 % (ref 43–77)
RBC: 3.89 MIL/uL (ref 3.87–5.11)
WBC: 9.9 10*3/uL (ref 4.0–10.5)

## 2013-08-16 MED ORDER — HYDROCODONE-ACETAMINOPHEN 5-325 MG PO TABS
1.0000 | ORAL_TABLET | Freq: Once | ORAL | Status: AC
  Administered 2013-08-16: 2 via ORAL
  Filled 2013-08-16: qty 2

## 2013-08-16 MED ORDER — ONDANSETRON 8 MG/NS 50 ML IVPB
8.0000 mg | Freq: Three times a day (TID) | INTRAVENOUS | Status: DC | PRN
  Administered 2013-08-16: 8 mg via INTRAVENOUS
  Filled 2013-08-16: qty 8

## 2013-08-16 MED ORDER — ONDANSETRON HCL 4 MG PO TABS: 8.0000 mg | ORAL_TABLET | Freq: Three times a day (TID) | ORAL | Status: DC | PRN

## 2013-08-16 MED ORDER — POTASSIUM CHLORIDE 10 MEQ/100ML IV SOLN
10.0000 meq | Freq: Once | INTRAVENOUS | Status: AC
  Administered 2013-08-16: 10 meq via INTRAVENOUS
  Filled 2013-08-16: qty 100

## 2013-08-16 MED ORDER — HYDROMORPHONE HCL PF 1 MG/ML IJ SOLN
0.5000 mg | Freq: Once | INTRAMUSCULAR | Status: AC
  Administered 2013-08-16: 0.5 mg via INTRAVENOUS
  Filled 2013-08-16: qty 1

## 2013-08-16 MED ORDER — SODIUM CHLORIDE 0.9 % IV SOLN
INTRAVENOUS | Status: AC
  Administered 2013-08-16: 01:00:00 via INTRAVENOUS

## 2013-08-16 MED ORDER — ONDANSETRON HCL 4 MG/2ML IJ SOLN
4.0000 mg | Freq: Three times a day (TID) | INTRAMUSCULAR | Status: DC | PRN
  Administered 2013-08-16: 4 mg via INTRAVENOUS
  Filled 2013-08-16: qty 2

## 2013-08-16 MED ORDER — HYDROCODONE-ACETAMINOPHEN 5-325 MG PO TABS
2.0000 | ORAL_TABLET | Freq: Once | ORAL | Status: AC
  Administered 2013-08-16: 2 via ORAL
  Filled 2013-08-16: qty 2

## 2013-08-16 MED ORDER — HYDROCODONE-ACETAMINOPHEN 5-325 MG PO TABS: 2.0000 | ORAL_TABLET | Freq: Once | ORAL | Status: DC

## 2013-08-16 MED ORDER — PROMETHAZINE HCL 25 MG/ML IJ SOLN
12.5000 mg | Freq: Once | INTRAMUSCULAR | Status: AC
  Administered 2013-08-16: 12.5 mg via INTRAVENOUS
  Filled 2013-08-16: qty 1

## 2013-08-16 MED ORDER — SODIUM CHLORIDE 0.9 % IV SOLN
INTRAVENOUS | Status: DC
  Administered 2013-08-16: 06:00:00 via INTRAVENOUS

## 2013-08-16 MED ORDER — ONDANSETRON HCL 4 MG/2ML IJ SOLN
4.0000 mg | Freq: Once | INTRAMUSCULAR | Status: AC
  Administered 2013-08-16: 4 mg via INTRAVENOUS
  Filled 2013-08-16: qty 2

## 2013-08-16 MED ORDER — ONDANSETRON HCL 8 MG PO TABS: 8.0000 mg | ORAL_TABLET | Freq: Three times a day (TID) | ORAL | Status: DC | PRN

## 2013-08-16 MED ORDER — CYCLOBENZAPRINE HCL 10 MG PO TABS: 10.0000 mg | ORAL_TABLET | Freq: Three times a day (TID) | ORAL | Status: DC | PRN

## 2013-08-16 MED ORDER — KCL IN DEXTROSE-NACL 40-5-0.45 MEQ/L-%-% IV SOLN
INTRAVENOUS | Status: DC
  Administered 2013-08-16: 10:00:00 via INTRAVENOUS

## 2013-08-16 NOTE — ED Provider Notes (Signed)
Medical screening examination/treatment/procedure(s) were conducted as a shared visit with non-physician practitioner(s) and myself.  I personally evaluated the patient during the encounter  4:42 AM patient still unable tolerate by mouth's, is dry heaving. IV potassium provided mild hypokalemia.  On exam has some right sided tenderness more so right flank and right upper quadrant. No tenderness over right lower quadrant.  Discussed with Dr. Despina Hidden and plan admit, cont IVfs and will keep NPO  Sunnie Nielsen, MD 08/16/13 [phone]

## 2013-08-16 NOTE — ED Notes (Signed)
Patient states that she is not vomiting, having some dry heaves, still feeling pressure. MD aware

## 2013-08-16 NOTE — Discharge Summary (Signed)
Physician Discharge Summary  Patient ID: [name] MRN: [id number] DOB/AGE: May 24, 1981 32 y.o.  Admit date: 08/16/2013 Discharge date: 08/16/2013  Admission Diagnoses: 102w4d Back pain, musculoskeletal Discharge Diagnoses:  same  Discharged Condition: good  Hospital Course: iv hydration, sonograms  Consults: None  Significant Diagnostic Studies: sonogram  Treatments: IV hydration  Discharge Exam: Blood pressure 105/46, pulse 87, temperature 98 F (36.7 C), temperature source Oral, resp. rate 17, height 5\' 3"  (1.6 m), weight 171 lb 15.3 oz (78 kg), last menstrual period 04/10/2013, SpO2 100.00%. General appearance: alert, cooperative and no distress Back: negative, symmetric, no curvature. ROM normal. No CVA tenderness.  Disposition: 01-Home or Self Care  Discharge Orders   Future Appointments Provider Department Dept Phone   08/19/2013 3:15 PM Lazaro Arms, MD [facility] [phone]   09/09/2013 9:00 AM [facility] [phone]   09/09/2013 9:45 AM Lazaro Arms, MD [facility] [phone]   Future Orders Complete By Expires   Call MD for:  persistant nausea and vomiting  As directed    Call MD for:  temperature >100.4  As directed    Diet - low sodium heart healthy  As directed    Increase activity slowly  As directed        Medication List         cyclobenzaprine 10 MG tablet  Commonly known as:  FLEXERIL  Take 1 tablet (10 mg total) by mouth 3 (three) times daily as needed for muscle spasms.     HYDROcodone-acetaminophen 5-325 MG per tablet  Commonly known as:  NORCO/VICODIN  Take 2 tablets by mouth once.     omeprazole 20 MG capsule  Commonly known as:  PRILOSEC  Take 1 capsule (20 mg total) by mouth daily.     ondansetron 8 MG tablet  Commonly known as:  ZOFRAN  Take 1 tablet (8 mg total) by mouth every 8 (eight) hours as needed for nausea.     prenatal vitamin w/FE, FA 27-1 MG Tabs tablet   Take 1 tablet by mouth daily at 12 noon.           Follow-up Information   Follow up with Lazaro Arms, MD. (keep scheduled)    Specialties:  Obstetrics and Gynecology, Radiology   Contact information:   [address] [place] [address] [phone]       Signed: Lazaro Arms 08/16/2013, 5:09 PM

## 2013-08-16 NOTE — Progress Notes (Signed)
08/16/13 1950 Late entry for 1800 Patient seen by Dr. Despina Hidden this evening, notified of c/o right side abdominal pain. Pt had stated decreased after Lortab as ordered earlier this afternoon. Stated would address, believed most likely muscular type pain. Pt okay for discharge home. Patient discharged home this evening. IV site d/c'd per nursing staff prior to discharge. Discharge instructions reviewed with patient and copy given per Herbert Seta, RN this evening. Pt received pain medication ordered per Dr. Despina Hidden prior to discharge as well by Herbert Seta, RN, see MAR. Pt left floor in stable condition via w/c accompanied by nursing staff. Earnstine Regal, RN

## 2013-08-16 NOTE — Progress Notes (Signed)
UR Chart Review Completed  

## 2013-08-16 NOTE — ED Provider Notes (Signed)
CSN: [id number]     Arrival date & time 08/15/13  2358 History     First MD Initiated Contact with Patient 08/16/13 0015     No chief complaint on file.  (Consider location/radiation/quality/duration/timing/severity/associated sxs/prior Treatment) Patient is a 32 y.o. female presenting with abdominal pain. The history is provided by the patient.  Abdominal Pain Pain location:  RLQ and suprapubic Pain quality: burning   Pain radiates to:  Does not radiate Pain severity:  Severe (9/10) Onset quality:  Gradual Duration:  12 hours Timing:  Constant Progression:  Worsening Chronicity:  New Relieved by:  Nothing Worsened by:  Nothing tried Ineffective treatments:  NSAIDs Associated symptoms: anorexia, nausea and vomiting   Associated symptoms: no chills, no dysuria and no fever   Risk factors: pregnancy    [name] is a 32 y.o. [id number] @ [redacted]w[redacted]d gestation who presents to the ED with abdominal pain that started approximately 12 hours ago. She has had frequent urination, urgency but only goes a small amount. Hx of kidney stones and this pain is similar.   Past Medical History  Diagnosis Date  . Pregnant   . Kidney stones    Past Surgical History  Procedure Laterality Date  . Kidney stents    . Lithotripsy     Family History  Problem Relation Age of Onset  . Cancer Mother     breast  . Diabetes Maternal Grandmother    History  Substance Use Topics  . Smoking status: Never Smoker   . Smokeless tobacco: Not on file  . Alcohol Use: No   OB History   Grav Para Term Preterm Abortions TAB SAB Ect Mult Living   4 3 3       3      Review of Systems  Constitutional: Negative for fever and chills.  Gastrointestinal: Positive for nausea, vomiting, abdominal pain and anorexia.  Genitourinary: Positive for urgency, frequency, flank pain and difficulty urinating. Negative for dysuria.  Skin: Negative for rash.  Neurological: Negative for dizziness and headaches.   Psychiatric/Behavioral: The patient is not nervous/anxious.     Allergies  Review of patient's allergies indicates no known allergies.  Home Medications   Current Outpatient Rx  Name  Route  Sig  Dispense  Refill  . cyclobenzaprine (FLEXERIL) 10 MG tablet   Oral   Take 1 tablet (10 mg total) by mouth 3 (three) times daily as needed for muscle spasms.   30 tablet   0   . Doxylamine-Pyridoxine 10-10 MG TBEC   Oral   Take 10-20 mg by mouth 2 (two) times daily. Takes two at bedtime and one in the morning         . omeprazole (PRILOSEC) 20 MG capsule   Oral   Take 1 capsule (20 mg total) by mouth daily.   30 capsule   6   . ondansetron (ZOFRAN ODT) 8 MG disintegrating tablet   Oral   Take 1 tablet (8 mg total) by mouth every 8 (eight) hours as needed for nausea.   20 tablet   0   . prenatal vitamin w/FE, FA (PRENATAL 1 + 1) 27-1 MG TABS   Oral   Take 1 tablet by mouth daily at 12 noon.   30 each   12   . promethazine (PHENERGAN) 12.5 MG tablet   Oral   Take 1 tablet (12.5 mg total) by mouth every 6 (six) hours as needed for nausea.   30 tablet   0  LMP 04/10/2013 Physical Exam  Nursing note and vitals reviewed. Constitutional: She is oriented to person, place, and time. She appears well-developed and well-nourished. No distress.  HENT:  Head: Normocephalic and atraumatic.  Eyes: EOM are normal.  Neck: Neck supple.  Cardiovascular: Regular rhythm.  Tachycardia present.   Pulmonary/Chest: Effort normal and breath sounds normal.  Abdominal: Soft. Bowel sounds are normal. There is tenderness in the right lower quadrant and suprapubic area. There is no rigidity, no rebound and no guarding. CVA tenderness: right flank pain.  Doppler FHT 164  Genitourinary:  Cervix finger tip, thick, posterior.  Musculoskeletal: Normal range of motion.  Neurological: She is alert and oriented to person, place, and time. No cranial nerve deficit.  Skin: Skin is warm and dry.   Psychiatric: She has a normal mood and affect. Her behavior is normal.   Results for orders placed during the hospital encounter of 08/16/13 (from the past 24 hour(s))  URINALYSIS, ROUTINE W REFLEX MICROSCOPIC     Status: Abnormal   Collection Time    08/16/13 12:28 AM      Result Value Range   Color, Urine YELLOW  YELLOW   APPearance HAZY (*) CLEAR   Specific Gravity, Urine 1.020  1.005 - 1.030   pH 6.5  5.0 - 8.0   Glucose, UA NEGATIVE  NEGATIVE mg/dL   Hgb urine dipstick NEGATIVE  NEGATIVE   Bilirubin Urine NEGATIVE  NEGATIVE   Ketones, ur NEGATIVE  NEGATIVE mg/dL   Protein, ur NEGATIVE  NEGATIVE mg/dL   Urobilinogen, UA 1.0  0.0 - 1.0 mg/dL   Nitrite NEGATIVE  NEGATIVE   Leukocytes, UA TRACE (*) NEGATIVE  URINE MICROSCOPIC-ADD ON     Status: Abnormal   Collection Time    08/16/13 12:28 AM      Result Value Range   Squamous Epithelial / LPF MANY (*) RARE   WBC, UA 0-2  <3 WBC/hpf   RBC / HPF 0-2  <3 RBC/hpf   Bacteria, UA FEW (*) RARE  CBC WITH DIFFERENTIAL     Status: Abnormal   Collection Time    08/16/13 12:34 AM      Result Value Range   WBC 9.9  4.0 - 10.5 K/uL   RBC 3.89  3.87 - 5.11 MIL/uL   Hemoglobin 11.6 (*) 12.0 - 15.0 g/dL   HCT 82.9 (*) 56.2 - 13.0 %   MCV 88.9  78.0 - 100.0 fL   MCH 29.8  26.0 - 34.0 pg   MCHC 33.5  30.0 - 36.0 g/dL   RDW 86.5  78.4 - 69.6 %   Platelets 253  150 - 400 K/uL   Neutrophils Relative % 70  43 - 77 %   Neutro Abs 7.0  1.7 - 7.7 K/uL   Lymphocytes Relative 21  12 - 46 %   Lymphs Abs 2.1  0.7 - 4.0 K/uL   Monocytes Relative 7  3 - 12 %   Monocytes Absolute 0.7  0.1 - 1.0 K/uL   Eosinophils Relative 2  0 - 5 %   Eosinophils Absolute 0.2  0.0 - 0.7 K/uL   Basophils Relative 0  0 - 1 %   Basophils Absolute 0.0  0.0 - 0.1 K/uL  COMPREHENSIVE METABOLIC PANEL     Status: Abnormal (Preliminary result)   Collection Time    08/16/13 12:34 AM      Result Value Range   Sodium 135  135 - 145 mEq/L   Potassium 3.2 (*)  3.5 - 5.1  mEq/L   Chloride 100  96 - 112 mEq/L   CO2 23  19 - 32 mEq/L   Glucose, Bld 122 (*) 70 - 99 mg/dL   BUN 4 (*) 6 - 23 mg/dL   Creatinine, Ser 1.61 (*) 0.50 - 1.10 mg/dL   Calcium 9.6  8.4 - 09.6 mg/dL   Total Protein 7.0  6.0 - 8.3 g/dL   Albumin 2.9 (*) 3.5 - 5.2 g/dL   AST 11  0 - 37 U/L   ALT 10  0 - 35 U/L   Alkaline Phosphatase 60  39 - 117 U/L   Total Bilirubin PENDING  0.3 - 1.2 mg/dL   GFR calc non Af Amer >90  >90 mL/min   GFR calc Af Amer >90  >90 mL/min    ED Course: Discussed with Dr. Despina Hidden, continue IVF and repeat pain medication.    Procedures Discussed patient with Dr. Dierdre Highman and he will assume care @ 02:10 am. Patient has had Dilaudid for pain and Zofran and Phenergan IV for nausea.  MDM    Janne Napoleon, NP 08/16/13 [phone]

## 2013-08-16 NOTE — Progress Notes (Signed)
08/16/13 1309 Late entry for 1030.  Fetal heart tones assessed this morning. Verified by M. Rubye Oaks, RN. Fetal heart tones 152 at first reading and then steady in the 130s.  Earnstine Regal, RN

## 2013-08-16 NOTE — ED Notes (Signed)
Pt states she has known right side cyst, states she started having right side pain and right lower abd pain today with nausea and vomiting.  Pt denies vag bleeding or drainage, is grav 4, para 3

## 2013-08-16 NOTE — Progress Notes (Signed)
08/16/13 1307 Patient c/o right sided abdominal discomfort this morning. No pain medication ordered. Paged Dr Despina Hidden x [phone] this morning and attempted to reach at office. Stated he was on call and could be reached by pager. Received call back from Dr. Despina Hidden this afternoon, notified of patient c/o pain and abdominal u/s completed. Order received for Lortab 1-2 tablets x 1 dose for pain. Earnstine Regal, RN

## 2013-08-16 NOTE — H&P (Signed)
(Consider location/radiation/quality/duration/timing/severity/associated sxs/prior  Treatment)  Patient is a 89 [id number] Estimated Date of Delivery: 01/27/14 [redacted]w[redacted]d   female presenting with right flank and mid abdominal pain. The history is provided by the patient.  S/P appy in past, wbc normal Abdominal Pain  Pain location: RLQ and suprapubic Pain quality: burning  Pain radiates to: Does not radiate  Pain severity: Severe (9/10)  Onset quality: Gradual  Duration: 12 hours  Timing: Constant  Progression: Worsening  Chronicity: New  Relieved by: Nothing  Worsened by: Nothing tried  Ineffective treatments: NSAIDs Associated symptoms: anorexia, nausea and vomiting  Associated symptoms: no chills, no dysuria and no fever  Risk factors: pregnancy  [name] is a 32 y.o. [id number] @ [redacted]w[redacted]d gestation who presents to the ED with abdominal pain that started approximately 12 hours ago. She has had frequent urination, urgency but only goes a small amount. Hx of kidney stones and this pain is similar.  Past Medical History   Diagnosis  Date   .  Pregnant    .  Kidney stones     Past Surgical History   Procedure  Laterality  Date   .  Kidney stents     .  Lithotripsy      Family History   Problem  Relation  Age of Onset   .  Cancer  Mother      breast   .  Diabetes  Maternal Grandmother     History   Substance Use Topics   .  Smoking status:  Never Smoker   .  Smokeless tobacco:  Not on file   .  Alcohol Use:  No    OB History    Grav  Para  Term  Preterm  Abortions  TAB  SAB  Ect  Mult  Living    4  3  3        3      Review of Systems  Constitutional: Negative for fever and chills.  Gastrointestinal: Positive for nausea, vomiting, abdominal pain and anorexia.  Genitourinary: Positive for urgency, frequency, flank pain and difficulty urinating. Negative for dysuria.  Skin: Negative for rash.  Neurological: Negative for dizziness and headaches.   Psychiatric/Behavioral: The patient is not nervous/anxious.  Allergies   Review of patient's allergies indicates no known allergies.  Home Medications    Current Outpatient Rx   Name   Route   Sig   Dispense   Refill   .  cyclobenzaprine (FLEXERIL) 10 MG tablet   Oral   Take 1 tablet (10 mg total) by mouth 3 (three) times daily as needed for muscle spasms.   30 tablet   0   .  Doxylamine-Pyridoxine 10-10 MG TBEC   Oral   Take 10-20 mg by mouth 2 (two) times daily. Takes two at bedtime and one in the morning       .  omeprazole (PRILOSEC) 20 MG capsule   Oral   Take 1 capsule (20 mg total) by mouth daily.   30 capsule   6   .  ondansetron (ZOFRAN ODT) 8 MG disintegrating tablet   Oral   Take 1 tablet (8 mg total) by mouth every 8 (eight) hours as needed for nausea.   20 tablet   0   .  prenatal vitamin w/FE, FA (PRENATAL 1 + 1) 27-1 MG TABS   Oral   Take 1 tablet by mouth daily at 12 noon.   30 each   12   .  promethazine (PHENERGAN) 12.5 MG tablet   Oral   Take 1 tablet (12.5 mg total) by mouth every 6 (six) hours as needed for nausea.   30 tablet   0   LMP 04/10/2013  Physical Exam  Nursing note and vitals reviewed.  Constitutional: She is oriented to person, place, and time. She appears well-developed and well-nourished. No distress.  HENT:  Head: Normocephalic and atraumatic.  Eyes: EOM are normal.  Neck: Neck supple.  Cardiovascular: Regular rhythm. Tachycardia present.  Pulmonary/Chest: Effort normal and breath sounds normal.  Abdominal: Soft. Bowel sounds are normal. There is tenderness in the right lower quadrant and suprapubic area. There is no rigidity, no rebound and no guarding. CVA tenderness: right flank pain.  Doppler FHT 164  Genitourinary:  Cervix finger tip, thick, posterior.  Musculoskeletal: Normal range of motion.  Neurological: She is alert and oriented to person, place, and time. No cranial nerve deficit.  Skin: Skin is warm and dry.  Psychiatric: She has a  normal mood and affect. Her behavior is normal.  Results for orders placed during the hospital encounter of 08/16/13 (from the past 24 hour(s))   URINALYSIS, ROUTINE W REFLEX MICROSCOPIC Status: Abnormal    Collection Time    08/16/13 12:28 AM   Result  Value  Range    Color, Urine  YELLOW  YELLOW    APPearance  HAZY (*)  CLEAR    Specific Gravity, Urine  1.020  1.005 - 1.030    pH  6.5  5.0 - 8.0    Glucose, UA  NEGATIVE  NEGATIVE mg/dL    Hgb urine dipstick  NEGATIVE  NEGATIVE    Bilirubin Urine  NEGATIVE  NEGATIVE    Ketones, ur  NEGATIVE  NEGATIVE mg/dL    Protein, ur  NEGATIVE  NEGATIVE mg/dL    Urobilinogen, UA  1.0  0.0 - 1.0 mg/dL    Nitrite  NEGATIVE  NEGATIVE    Leukocytes, UA  TRACE (*)  NEGATIVE   URINE MICROSCOPIC-ADD ON Status: Abnormal    Collection Time    08/16/13 12:28 AM   Result  Value  Range    Squamous Epithelial / LPF  MANY (*)  RARE    WBC, UA  0-2  <3 WBC/hpf    RBC / HPF  0-2  <3 RBC/hpf    Bacteria, UA  FEW (*)  RARE   CBC WITH DIFFERENTIAL Status: Abnormal    Collection Time    08/16/13 12:34 AM   Result  Value  Range    WBC  9.9  4.0 - 10.5 K/uL    RBC  3.89  3.87 - 5.11 MIL/uL    Hemoglobin  11.6 (*)  12.0 - 15.0 g/dL    HCT  08.6 (*)  57.8 - 46.0 %    MCV  88.9  78.0 - 100.0 fL    MCH  29.8  26.0 - 34.0 pg    MCHC  33.5  30.0 - 36.0 g/dL    RDW  46.9  62.9 - 52.8 %    Platelets  253  150 - 400 K/uL    Neutrophils Relative %  70  43 - 77 %    Neutro Abs  7.0  1.7 - 7.7 K/uL    Lymphocytes Relative  21  12 - 46 %    Lymphs Abs  2.1  0.7 - 4.0 K/uL    Monocytes Relative  7  3 - 12 %  Monocytes Absolute  0.7  0.1 - 1.0 K/uL    Eosinophils Relative  2  0 - 5 %    Eosinophils Absolute  0.2  0.0 - 0.7 K/uL    Basophils Relative  0  0 - 1 %    Basophils Absolute  0.0  0.0 - 0.1 K/uL   COMPREHENSIVE METABOLIC PANEL Status: Abnormal (Preliminary result)    Collection Time    08/16/13 12:34 AM   Result  Value  Range    Sodium  135  135 - 145  mEq/L    Potassium  3.2 (*)  3.5 - 5.1 mEq/L    Chloride  100  96 - 112 mEq/L    CO2  23  19 - 32 mEq/L    Glucose, Bld  122 (*)  70 - 99 mg/dL    BUN  4 (*)  6 - 23 mg/dL    Creatinine, Ser  1.61 (*)  0.50 - 1.10 mg/dL    Calcium  9.6  8.4 - 10.5 mg/dL    Total Protein  7.0  6.0 - 8.3 g/dL    Albumin  2.9 (*)  3.5 - 5.2 g/dL    AST  11  0 - 37 U/L    ALT  10  0 - 35 U/L    Alkaline Phosphatase  60  39 - 117 U/L    Total Bilirubin  PENDING  0.3 - 1.2 mg/dL    GFR calc non Af Amer  >90  >90 mL/min    GFR calc Af Amer  >90  >90 mL/min    ED Course: Discussed with Dr. Despina Hidden, continue IVF and repeat pain medication.   Procedures  Discussed patient with Dr. Dierdre Highman and he will assume care @ 02:10 am. Patient has had Dilaudid for pain and Zofran and Phenergan IV for nausea.    Imp [redacted]w[redacted]d with right back and radiating pain  Plan Sonogram ot evaluate for stones both of the gallbladder and kidney  EURE,LUTHER H 08/16/2013 5:05 PM

## 2013-08-19 ENCOUNTER — Encounter: Payer: Medicaid Other | Admitting: Obstetrics & Gynecology

## 2013-08-31 ENCOUNTER — Encounter: Payer: Medicaid Other | Admitting: Obstetrics & Gynecology

## 2013-09-09 ENCOUNTER — Other Ambulatory Visit: Payer: Self-pay | Admitting: Obstetrics & Gynecology

## 2013-09-09 ENCOUNTER — Ambulatory Visit (INDEPENDENT_AMBULATORY_CARE_PROVIDER_SITE_OTHER): Payer: Medicaid Other

## 2013-09-09 ENCOUNTER — Ambulatory Visit (INDEPENDENT_AMBULATORY_CARE_PROVIDER_SITE_OTHER): Payer: Medicaid Other | Admitting: Obstetrics & Gynecology

## 2013-09-09 VITALS — BP 140/68 | Wt 174.0 lb

## 2013-09-09 DIAGNOSIS — Z363 Encounter for antenatal screening for malformations: Secondary | ICD-10-CM

## 2013-09-09 DIAGNOSIS — Z331 Pregnant state, incidental: Secondary | ICD-10-CM

## 2013-09-09 DIAGNOSIS — O99019 Anemia complicating pregnancy, unspecified trimester: Secondary | ICD-10-CM

## 2013-09-09 DIAGNOSIS — Z1389 Encounter for screening for other disorder: Secondary | ICD-10-CM

## 2013-09-09 NOTE — Progress Notes (Signed)
Sonogram reviewed and bilateral hydronephrosis noted as an isolated finding. BP weight and urine results all reviewed and noted. Patient reports good fetal movement, denies any bleeding and no rupture of membranes symptoms or regular contractions. Patient is without complaints. All questions were answered.

## 2013-09-09 NOTE — Progress Notes (Signed)
Pt states "feels like she needs to empty her bladder but when she urinates she only voids a little."

## 2013-09-09 NOTE — Progress Notes (Signed)
U/S(20+0wks)-active fetus, meas c/w dates, fluid wnl, cx 3.8cm and closed, Rt Ov with 2.8cm simple cyst remains, Lt ovary WNL, ant gr 0 plac, Bilateral Mild Hydronephrosis with normal bladder volume noted in a female fetus, no other abnl noted, would like to reck hydronephrosis at ~28 wks

## 2013-09-15 ENCOUNTER — Observation Stay (HOSPITAL_COMMUNITY)
Admission: AD | Admit: 2013-09-15 | Discharge: 2013-09-16 | Disposition: A | Discharge status: Home / Self Care | Payer: Medicaid Other | Source: Ambulatory Visit | Attending: Obstetrics & Gynecology | Admitting: Obstetrics & Gynecology

## 2013-09-15 ENCOUNTER — Emergency Department (HOSPITAL_COMMUNITY): Payer: Medicaid Other

## 2013-09-15 ENCOUNTER — Observation Stay (HOSPITAL_COMMUNITY): Payer: Medicaid Other

## 2013-09-15 ENCOUNTER — Other Ambulatory Visit: Payer: Self-pay | Admitting: Obstetrics and Gynecology

## 2013-09-15 ENCOUNTER — Encounter (HOSPITAL_COMMUNITY): Payer: Self-pay | Admitting: Emergency Medicine

## 2013-09-15 DIAGNOSIS — O2390 Unspecified genitourinary tract infection in pregnancy, unspecified trimester: Secondary | ICD-10-CM | POA: Insufficient documentation

## 2013-09-15 DIAGNOSIS — O479 False labor, unspecified: Secondary | ICD-10-CM

## 2013-09-15 DIAGNOSIS — O200 Threatened abortion: Principal | ICD-10-CM | POA: Insufficient documentation

## 2013-09-15 DIAGNOSIS — N390 Urinary tract infection, site not specified: Secondary | ICD-10-CM | POA: Insufficient documentation

## 2013-09-15 DIAGNOSIS — Z3482 Encounter for supervision of other normal pregnancy, second trimester: Secondary | ICD-10-CM

## 2013-09-15 DIAGNOSIS — O4700 False labor before 37 completed weeks of gestation, unspecified trimester: Secondary | ICD-10-CM

## 2013-09-15 DIAGNOSIS — O4702 False labor before 37 completed weeks of gestation, second trimester: Secondary | ICD-10-CM

## 2013-09-15 DIAGNOSIS — R319 Hematuria, unspecified: Secondary | ICD-10-CM | POA: Insufficient documentation

## 2013-09-15 DIAGNOSIS — O2341 Unspecified infection of urinary tract in pregnancy, first trimester: Secondary | ICD-10-CM

## 2013-09-15 DIAGNOSIS — R109 Unspecified abdominal pain: Secondary | ICD-10-CM | POA: Insufficient documentation

## 2013-09-15 LAB — URINALYSIS, ROUTINE W REFLEX MICROSCOPIC
Leukocytes, UA: NEGATIVE
Nitrite: NEGATIVE
Specific Gravity, Urine: 1.01 (ref 1.005–1.030)
pH: 6 (ref 5.0–8.0)

## 2013-09-15 LAB — RAPID URINE DRUG SCREEN, HOSP PERFORMED
Amphetamines: NOT DETECTED
Barbiturates: NOT DETECTED
Cocaine: NOT DETECTED
Opiates: NOT DETECTED
Tetrahydrocannabinol: NOT DETECTED

## 2013-09-15 LAB — LIPASE, BLOOD: Lipase: 72 U/L — ABNORMAL HIGH (ref 11–59)

## 2013-09-15 LAB — COMPREHENSIVE METABOLIC PANEL
ALT: 11 U/L (ref 0–35)
Albumin: 2.9 g/dL — ABNORMAL LOW (ref 3.5–5.2)
Alkaline Phosphatase: 68 U/L (ref 39–117)
BUN: 5 mg/dL — ABNORMAL LOW (ref 6–23)
Chloride: 102 mEq/L (ref 96–112)
GFR calc Af Amer: >90 mL/min (ref >90)
Glucose, Bld: 101 mg/dL — ABNORMAL HIGH (ref 70–99)
Potassium: 3.6 mEq/L (ref 3.5–5.1)
Sodium: 135 mEq/L (ref 135–145)
Total Bilirubin: 0.2 mg/dL — ABNORMAL LOW (ref 0.3–1.2)

## 2013-09-15 LAB — TYPE AND SCREEN
ABO/RH(D): O POS
Antibody Screen: NEGATIVE

## 2013-09-15 LAB — CBC WITH DIFFERENTIAL/PLATELET
Hemoglobin: 11.3 g/dL — ABNORMAL LOW (ref 12.0–15.0)
Lymphs Abs: 2 10*3/uL (ref 0.7–4.0)
Monocytes Relative: 7 % (ref 3–12)
Neutro Abs: 7.1 10*3/uL (ref 1.7–7.7)
Neutrophils Relative %: 72 % (ref 43–77)
RBC: 3.91 MIL/uL (ref 3.87–5.11)
WBC: 9.9 10*3/uL (ref 4.0–10.5)

## 2013-09-15 LAB — ABO/RH: ABO/RH(D): O POS

## 2013-09-15 MED ORDER — INDOMETHACIN 25 MG PO CAPS
25.0000 mg | ORAL_CAPSULE | Freq: Once | ORAL | Status: AC
  Administered 2013-09-15: 25 mg via ORAL
  Filled 2013-09-15: qty 1

## 2013-09-15 MED ORDER — ACETAMINOPHEN 325 MG PO TABS
650.0000 mg | ORAL_TABLET | ORAL | Status: DC | PRN
  Administered 2013-09-15: 650 mg via ORAL
  Filled 2013-09-15: qty 2

## 2013-09-15 MED ORDER — NIFEDIPINE 10 MG PO CAPS
10.0000 mg | ORAL_CAPSULE | Freq: Once | ORAL | Status: AC
  Administered 2013-09-15: 10 mg via ORAL
  Filled 2013-09-15: qty 1

## 2013-09-15 MED ORDER — PRENATAL MULTIVITAMIN CH: 1.0000 | ORAL_TABLET | Freq: Every day | ORAL | Status: DC

## 2013-09-15 MED ORDER — INFLUENZA VAC SPLIT QUAD 0.5 ML IM SUSP
0.5000 mL | INTRAMUSCULAR | Status: DC
  Filled 2013-09-15: qty 0.5

## 2013-09-15 MED ORDER — MORPHINE SULFATE 4 MG/ML IJ SOLN: 4.0000 mg | Freq: Once | INTRAMUSCULAR | Status: DC

## 2013-09-15 MED ORDER — MORPHINE SULFATE 4 MG/ML IJ SOLN
4.0000 mg | Freq: Once | INTRAMUSCULAR | Status: AC
  Administered 2013-09-15: 4 mg via INTRAVENOUS
  Filled 2013-09-15: qty 1

## 2013-09-15 MED ORDER — INDOMETHACIN 25 MG PO CAPS
25.0000 mg | ORAL_CAPSULE | Freq: Once | ORAL | Status: AC
  Administered 2013-09-15: 25 mg via ORAL

## 2013-09-15 MED ORDER — MORPHINE SULFATE 2 MG/ML IJ SOLN
4.0000 mg | Freq: Once | INTRAMUSCULAR | Status: AC
  Administered 2013-09-15: 4 mg via INTRAVENOUS
  Filled 2013-09-15: qty 2

## 2013-09-15 MED ORDER — SODIUM CHLORIDE 0.9 % IV SOLN
1000.0000 mL | INTRAVENOUS | Status: DC
  Administered 2013-09-15: 1000 mL via INTRAVENOUS

## 2013-09-15 MED ORDER — INDOMETHACIN 25 MG PO CAPS
25.0000 mg | ORAL_CAPSULE | Freq: Four times a day (QID) | ORAL | Status: DC
  Administered 2013-09-16 (×2): 25 mg via ORAL
  Filled 2013-09-15 (×3): qty 1

## 2013-09-15 MED ORDER — INDOMETHACIN 25 MG SUPPOSITORY: 25.0000 mg | Freq: Once | RECTAL | Status: DC

## 2013-09-15 MED ORDER — DOCUSATE SODIUM 100 MG PO CAPS
100.0000 mg | ORAL_CAPSULE | Freq: Every day | ORAL | Status: DC
  Administered 2013-09-15: 100 mg via ORAL
  Filled 2013-09-15: qty 1

## 2013-09-15 MED ORDER — CALCIUM CARBONATE ANTACID 500 MG PO CHEW
2.0000 | CHEWABLE_TABLET | ORAL | Status: DC | PRN
  Administered 2013-09-15 (×2): 400 mg via ORAL
  Filled 2013-09-15: qty 2
  Filled 2013-09-15: qty 1

## 2013-09-15 MED ORDER — ONDANSETRON HCL 4 MG/2ML IJ SOLN
4.0000 mg | Freq: Once | INTRAMUSCULAR | Status: AC
  Administered 2013-09-15: 4 mg via INTRAVENOUS
  Filled 2013-09-15: qty 2

## 2013-09-15 MED ORDER — SODIUM CHLORIDE 0.9 % IV SOLN
INTRAVENOUS | Status: DC
  Administered 2013-09-15 – 2013-09-16 (×2): via INTRAVENOUS

## 2013-09-15 MED ORDER — ZOLPIDEM TARTRATE 5 MG PO TABS: 5.0000 mg | ORAL_TABLET | Freq: Every evening | ORAL | Status: DC | PRN

## 2013-09-15 MED ORDER — OXYCODONE-ACETAMINOPHEN 5-325 MG PO TABS
2.0000 | ORAL_TABLET | Freq: Once | ORAL | Status: AC
  Administered 2013-09-15: 2 via ORAL
  Filled 2013-09-15: qty 2

## 2013-09-15 MED ORDER — SODIUM CHLORIDE 0.9 % IV SOLN
1000.0000 mL | Freq: Once | INTRAVENOUS | Status: AC
  Administered 2013-09-15: 1000 mL via INTRAVENOUS

## 2013-09-15 MED ORDER — DEXTROSE 5 % IV SOLN
1.0000 g | Freq: Once | INTRAVENOUS | Status: AC
  Administered 2013-09-15: 1 g via INTRAVENOUS
  Filled 2013-09-15: qty 10

## 2013-09-15 MED ORDER — DEXTROSE 5 % IV SOLN
1.0000 g | Freq: Two times a day (BID) | INTRAVENOUS | Status: DC
  Administered 2013-09-15: 1 g via INTRAVENOUS
  Filled 2013-09-15 (×2): qty 10

## 2013-09-15 NOTE — ED Notes (Signed)
Dr. Emelda Fear at bedside to examine.

## 2013-09-15 NOTE — ED Notes (Addendum)
Charted on wrong pt.

## 2013-09-15 NOTE — Progress Notes (Signed)
Call to [facility] ED, they state that pt is stable and they are continuing to watch

## 2013-09-15 NOTE — ED Provider Notes (Signed)
Pt seen by Dr. Emelda Fear and transferred to [facility]. I filled out disposition and EMTALA form, but was otherwise not involved in her care.   Dayna Geurts B. Bernette Mayers, MD 09/15/13 [phone]

## 2013-09-15 NOTE — Progress Notes (Signed)
Call to [facility] RN Victorino Dike, pt is contracting suggestion to speak to EDP give 500 LR bolus and contact FP attending

## 2013-09-15 NOTE — ED Notes (Signed)
Patient c/o abdominal cramping and vaginal pressure since yesterday; called OB and was told to come to ER.

## 2013-09-15 NOTE — ED Notes (Signed)
Placed pt back on toco monitor per Dr. Emelda Fear.

## 2013-09-15 NOTE — ED Notes (Signed)
Continues to c/o intermittent abd pain/contractions, and intermittent stabbing pain to vagina - states the pain occurs approx every 5 min and lasts approx 30 sec.

## 2013-09-15 NOTE — Progress Notes (Signed)
Spoke with Dr. Emelda Fear who is at bedside. Stated SVE cl/thick/high, and he could palpate contractions. Stated that he planned to monitor pt for alittle longer and then discharge pt home with a return appointment with OB in am.

## 2013-09-15 NOTE — H&P (Signed)
[name] is a 32 y.o. female  g57  P3003 presenting for observation for persistent uterine contractions at 20wk 6 d , regular q 5 minutes, palpably firm, without bleeding or SROM, that has failed to respond to iv hydration, PO procardia 10, and analgesic. Contractions q 5 regular, uncomfortable to pt, . Will admit, begin Indocin 50 Loading here at [facility], observe on Ante to assess response. Has history of UTI in early pregnancy, treated with macrodantin with subsequent negative u/a. Renal Ultrasound this a.m. Negative.Hx renal stones.No lateralizing of abd pain.  History OB History   Grav Para Term Preterm Abortions TAB SAB Ect Mult Living   4 3 3       3      Past Medical History  Diagnosis Date  . Pregnant   . Kidney stones    Past Surgical History  Procedure Laterality Date  . Kidney stents    . Lithotripsy     Family History: family history includes Cancer in her mother; Diabetes in her maternal grandmother. Social History:  reports that she has never smoked. She does not have any smokeless tobacco history on file. She reports that she does not drink alcohol or use illicit drugs.  ROS no hx drug use    Blood pressure 119/59, pulse 93, temperature 98.6 F (37 C), temperature source Oral, resp. rate 18, last menstrual period 04/10/2013, SpO2 97.00%. Exam Physical Exam Physical Examination: General appearance - alert, well appearing, and in no distress, oriented to person, place, and time, normal appearing weight and uncomfortable with each contraction, changing breathing  pattern Mental status - alert, oriented to person, place, and time Eyes - pupils equal and reactive, extraocular eye movements intact Abdomen - soft, nontender, nondistended, no masses or organomegaly Gravid uterus to above unbilicus  Pelvic - VULVA: normal appearing vulva with no masses, tenderness or lesions, VAGINA: normal appearing vagina with normal color and discharge, no lesions, CERVIX:  normal appearing cervix without discharge or lesions, cervix firm  Prenatal labs: ABO, Rh: O/POS/-- (06/16 1203) Antibody: NEG (06/16 1203) Rubella: 1.84 (06/16 1203) RPR: NON REAC (06/16 1203)  HBsAg: NEGATIVE (06/16 1203)  HIV: NON REACTIVE (06/16 1203)  GBS:    Urine dipstick shows positive for RBC's.  Micro exam: tntc RBC's per HPF.  Assessment/Plan: Pregnancy 20w 6 d Preterm contractions Hematuria  Hx Uti with adeq tx this pregnancy  Admit for p.o. Indocin Begin Rocephin  Probable brief admit UDS followup u/s [name] 09/15/2013, 1:35 PM

## 2013-09-15 NOTE — ED Notes (Signed)
Dr. Emelda Fear made aware of pt vomiting and still contracting. States he will make plans to [facility]. Pt aware

## 2013-09-15 NOTE — ED Notes (Signed)
Spoke with Candise Bowens the OB Rapid Response RN, who advised that pt is contracting. EDP notified.

## 2013-09-15 NOTE — ED Provider Notes (Signed)
CSN: [id number]     Arrival date & time 09/15/13  0152 History   First MD Initiated Contact with Patient 09/15/13 [phone]     Chief Complaint  Patient presents with  . Abdominal Cramping    HPI Patient is a G4P3 EDD 02/06/14 by early ultrasound presenting with abdominal cramping and vaginal pressure over the past 24 hours.  She contacted her OB/GYN who recommended she come to the ER for evaluation.  She reports that the abdominal pain is located in her lower abdomen as described at times as a tightness.  She also reports an occasional sharp knifelike pain in her vagina.  She reports that pain lasts for a few seconds and occasionally up to a minute.  She did have issues with preterm labor with her first child after approximately 16 weeks.  All of her deliveries were vaginal.  She's had no complications with this pregnancy thus far.  She was seen recently for some abdominal pain and had an ultrasound that demonstrated ileus sludge and a left renal calculus.  She reports her pain is moderate in severity at this time.  Her pain waxes and wanes.  She denies flank pain.  She denies radiating pain to her left flank.    Past Medical History  Diagnosis Date  . Pregnant   . Kidney stones    Past Surgical History  Procedure Laterality Date  . Kidney stents    . Lithotripsy     Family History  Problem Relation Age of Onset  . Cancer Mother     breast  . Diabetes Maternal Grandmother    History  Substance Use Topics  . Smoking status: Never Smoker   . Smokeless tobacco: Not on file  . Alcohol Use: No   OB History   Grav Para Term Preterm Abortions TAB SAB Ect Mult Living   4 3 3       3      Review of Systems  All other systems reviewed and are negative.    Allergies  Review of patient's allergies indicates no known allergies.  Home Medications   Current Outpatient Rx  Name  Route  Sig  Dispense  Refill  . cyclobenzaprine (FLEXERIL) 10 MG tablet   Oral   Take 1 tablet (10 mg total)  by mouth 3 (three) times daily as needed for muscle spasms.   30 tablet   1   . omeprazole (PRILOSEC) 20 MG capsule   Oral   Take 1 capsule (20 mg total) by mouth daily.   30 capsule   6   . prenatal vitamin w/FE, FA (PRENATAL 1 + 1) 27-1 MG TABS   Oral   Take 1 tablet by mouth daily at 12 noon.   30 each   12   . HYDROcodone-acetaminophen (NORCO/VICODIN) 5-325 MG per tablet   Oral   Take 2 tablets by mouth once.   20 tablet   0   . ondansetron (ZOFRAN) 8 MG tablet   Oral   Take 1 tablet (8 mg total) by mouth every 8 (eight) hours as needed for nausea.   20 tablet   0    BP 148/82  Pulse 105  Temp(Src) 99 F (37.2 C) (Oral)  Resp 20  SpO2 100%  LMP 04/10/2013 Physical Exam  Nursing note and vitals reviewed. Constitutional: She is oriented to person, place, and time. She appears well-developed and well-nourished. No distress.  HENT:  Head: Normocephalic and atraumatic.  Eyes: EOM are normal.  Neck: Normal range of motion.  Cardiovascular: Normal rate, regular rhythm and normal heart sounds.   Pulmonary/Chest: Effort normal and breath sounds normal.  Abdominal: Soft. She exhibits no distension. There is no tenderness.  Genitourinary:  Cervix is fingertip at this time.  No effacement.  sterile speculum exam completed no evidence of bulging bag.  Musculoskeletal: Normal range of motion.  Neurological: She is alert and oriented to person, place, and time.  Skin: Skin is warm and dry.  Psychiatric: She has a normal mood and affect. Judgment normal.    ED Course  Procedures (including critical care time) Labs Review Labs Reviewed  URINALYSIS, ROUTINE W REFLEX MICROSCOPIC - Abnormal; Notable for the following:    Hgb urine dipstick LARGE (*)    All other components within normal limits  CBC WITH DIFFERENTIAL - Abnormal; Notable for the following:    Hemoglobin 11.3 (*)    HCT 34.1 (*)    All other components within normal limits  COMPREHENSIVE METABOLIC PANEL  - Abnormal; Notable for the following:    Glucose, Bld 101 (*)    BUN 5 (*)    Creatinine, Ser 0.43 (*)    Albumin 2.9 (*)    Total Bilirubin 0.2 (*)    All other components within normal limits  LIPASE, BLOOD - Abnormal; Notable for the following:    Lipase 72 (*)    All other components within normal limits  URINE MICROSCOPIC-ADD ON - Abnormal; Notable for the following:    Squamous Epithelial / LPF FEW (*)    Bacteria, UA FEW (*)    All other components within normal limits   Imaging Review No results found.  MDM  No diagnosis found.   3:26 AM Spoke with Dr Emelda Fear, OB at this time. Given closed cervix at this time and 20 weeks 3/7 he feels comfortable keeping the pt in the ER for several hours and he will see the pt at 7am. I will recheck her cervix in 1.5 hours. Will continue to monitor the pt closely.   5:40 AM Repeat cervical exam demonstrates ongoing fingertip cervix without effacement. No loss of fluid or bleeding. Contractions and pain improved, but pt reports symptoms are now returning  7:08 AM Patient have recurrence of pain at this time.  Renal ultrasound obtained as the patient may have symptoms to suggest left ureteral colic given her severe pain and nausea and vomiting.  She still does appear to have contractions on the monitor.  Dr. person will be in shortly to evaluate the patient.  Additional pain and nausea medicine given.  No loss of fluid.  Lyanne Co, MD 09/15/13 [phone]

## 2013-09-15 NOTE — Consult Note (Signed)
History     CSN: [id number]  Arrival date and time: 09/15/13 0152   None     Chief Complaint  Patient presents with  . Abdominal Cramping   Abdominal Cramping   Patient is a G4P3 EDD 02/06/14 by early ultrasound presenting with abdominal cramping and vaginal pressure over the past 24 hours. She contacted her OB/GYN who recommended she come to the ER for evaluation. She reports that the abdominal pain is located in her lower abdomen as described at times as a tightness. She also reports an occasional sharp knifelike pain in her vagina. She reports that pain lasts for a few seconds and occasionally up to a minute. She did have issues with preterm labor with her first child after approximately 16 weeks. All of her deliveries were vaginal. She's had no complications with this pregnancy thus far. She was seen recently for some abdominal pain and had an ultrasound that demonstrated ileus sludge and a left renal calculus. She reports her pain is moderate in severity at this time. Her pain waxes and wanes. She denies flank pain. She denies radiating pain to her left flank.  Assessment at 8 am.jvf.  PT sleeping. Toco monitor from night reviewed, showing regular contractions q38min, no bleeding or srom. Cervix rechk by Dr Patria Mane at  5 am showed no cervix changes. Pt sleeping at present. Will restart ext toco monitoring Renal ultrasound : NO hydronephrosis, no dilated calyces, No renal enlargement. Pelvic: Cervix long closed posterior, presenting part not applied to cx. U/A POS for TNTC red cells , few bacteria       Past Medical History  Diagnosis Date  . Pregnant   . Kidney stones     Past Surgical History  Procedure Laterality Date  . Kidney stents    . Lithotripsy      Family History  Problem Relation Age of Onset  . Cancer Mother     breast  . Diabetes Maternal Grandmother     History  Substance Use Topics  . Smoking status: Never Smoker   . Smokeless tobacco: Not on file   . Alcohol Use: No    Allergies: No Known Allergies   (Not in a hospital admission)  ROS Physical Exam   Blood pressure 121/62, pulse 100, temperature 99 F (37.2 C), temperature source Oral, resp. rate 20, last menstrual period 04/10/2013, SpO2 99.00%.   Physical Exam  Constitutional: She is oriented to person, place, and time. She appears well-developed and well-nourished.  Eyes: Pupils are equal, round, and reactive to light.  GI: Soft. She exhibits no distension. There is no tenderness. There is no rebound and no guarding.  Gravid uterus nontender  Genitourinary: Vagina normal.  Cervix long closed posterior firm, no bleeding or srom  Neurological: She is alert and oriented to person, place, and time.  Skin: Skin is warm and dry.  Psychiatric: She has a normal mood and affect.    MAU Course  Procedures Hydration overnight shows CBC    Component Value Date/Time   WBC 9.9 09/15/2013 0235   RBC 3.91 09/15/2013 0235   HGB 11.3* 09/15/2013 0235   HCT 34.1* 09/15/2013 0235   PLT 272 09/15/2013 0235   MCV 87.2 09/15/2013 0235   MCH 28.9 09/15/2013 0235   MCHC 33.1 09/15/2013 0235   RDW 13.5 09/15/2013 0235   LYMPHSABS 2.0 09/15/2013 0235   MONOABS 0.7 09/15/2013 0235   EOSABS 0.1 09/15/2013 0235   BASOSABS 0.0 09/15/2013 0235    .  Micro exam: tntc RBC's per HPF.few bacteria  MDM Review of u/s renal prior record, reassess fetal monitoring  Assessment and Plan   Will give procardia x 1 to see if uterine contractions will resolve, monitor thru the morning, reassess midday Will send urine culture, begin rocephin. If pt needs observation (continued contractions) , will need transfer as pt above 20 wks.  Benny Henrie V 09/15/2013, 8:25 AM

## 2013-09-15 NOTE — ED Notes (Signed)
Pt presents with lower abdominal pain that she rates 10/10. Pt reports white discharge. Pt also has been vomiting since 9pm. Pt is para 3 gravida 4.

## 2013-09-16 DIAGNOSIS — O4700 False labor before 37 completed weeks of gestation, unspecified trimester: Secondary | ICD-10-CM

## 2013-09-16 DIAGNOSIS — O479 False labor, unspecified: Secondary | ICD-10-CM

## 2013-09-16 LAB — URINE CULTURE: Colony Count: 15000

## 2013-09-16 LAB — CBC
HCT: 28.9 % — ABNORMAL LOW (ref 36.0–46.0)
MCHC: 33.2 g/dL (ref 30.0–36.0)
Platelets: 225 10*3/uL (ref 150–400)
RDW: 13.4 % (ref 11.5–15.5)
WBC: 7.9 10*3/uL (ref 4.0–10.5)

## 2013-09-16 MED ORDER — CEPHALEXIN 500 MG PO CAPS: 500.0000 mg | ORAL_CAPSULE | Freq: Four times a day (QID) | ORAL | Status: DC

## 2013-09-16 MED ORDER — CEPHALEXIN 500 MG PO CAPS
500.0000 mg | ORAL_CAPSULE | Freq: Four times a day (QID) | ORAL | Status: DC
  Administered 2013-09-16: 500 mg via ORAL
  Filled 2013-09-16: qty 1

## 2013-09-16 NOTE — Discharge Summary (Signed)
Obstetric Discharge Summary Reason for Admission: preterm uterine contractions [redacted]w[redacted]d,  possible uti. Prenatal Procedures: ultrasound renal , ultrasound Ob limited Intrapartum Procedures: n/a Postpartum Procedures: n/q Complications-Operative and Postpartum:  This 32 yr female, G4 P3003, with three deliveries at early term (38 wk) admitted due to what appeared to be regular contractions on external monitor in ED [facility] , unresponsive to hydration and Procardia 10 x 1. Workup positive only for TNTC hematuria. Observed to administer tocolytic and Iv Rocephin to cover ?uti.  Pt noted to have a habit of abdominal tightening resulting in the appearance of contraction regularly occurring only while awake.  Pt perceives discomfort as having improved significantly, still calls it an 8 on 1-10 scale, but is resting comfortably throughout conversation. No changes in cervix on repeat exam  It is uncertain if the abd tightening was anything more than patient tightening abd muscles. Nonetheless, preterm labor recomeendations to be given, and will treat to cover the possibility of uti, until c&S returns.  <<This patient has not yet delivered during this pregnancy.>>  H/H: Lab Results  Component Value Date/Time   HGB 9.6* 09/16/2013  5:35 AM   HCT 28.9* 09/16/2013  5:35 AM      Discharge Diagnoses: preterm contractions, resolved                                        Hematuria  Discharge Information: Date: 07/04/2011 Activity: pelvic rest Diet: routine Medications: keflex Condition: stable Instructions: take keflex x 1 week, keep scheduled appt for followup.  Discharge to: home   Laurence Folz V 09/16/2013,9:44 AM

## 2013-09-16 NOTE — Progress Notes (Signed)
RN at bedside and palpating for UC.  Abd very soft and nontender.  Unable to palpate contractions

## 2013-09-16 NOTE — Plan of Care (Signed)
Problem: Consults Goal: Birthing Suites Patient Information Press F2 to bring up selections list   Pt < [redacted] weeks EGA     

## 2013-09-16 NOTE — Progress Notes (Signed)
PT off the monitor and discharge instructions reviewed by Dr. Emelda Fear

## 2013-09-17 ENCOUNTER — Encounter: Payer: Medicaid Other | Admitting: Obstetrics & Gynecology

## 2013-09-27 ENCOUNTER — Telehealth: Payer: Self-pay | Admitting: Obstetrics and Gynecology

## 2013-09-27 NOTE — Telephone Encounter (Signed)
Spoke with pt. Cramping, light, mucus discharge with odor started yesterday. Having nausea and vomiting, since yesterday. Unable to keep anything down. Pt states she feels dehydrated. Has urinated only twice today. Advised to go to MAU at [facility] for possible IV fluids. Pt voiced understanding and stated she would head over there when she got her kids off the school bus. JSY

## 2013-10-07 ENCOUNTER — Encounter: Payer: Self-pay | Admitting: Obstetrics and Gynecology

## 2013-10-07 ENCOUNTER — Ambulatory Visit (INDEPENDENT_AMBULATORY_CARE_PROVIDER_SITE_OTHER): Payer: Medicaid Other | Admitting: Obstetrics and Gynecology

## 2013-10-07 ENCOUNTER — Encounter (INDEPENDENT_AMBULATORY_CARE_PROVIDER_SITE_OTHER): Payer: Self-pay

## 2013-10-07 VITALS — BP 132/80 | Wt 182.2 lb

## 2013-10-07 DIAGNOSIS — O99019 Anemia complicating pregnancy, unspecified trimester: Secondary | ICD-10-CM

## 2013-10-07 DIAGNOSIS — Z131 Encounter for screening for diabetes mellitus: Secondary | ICD-10-CM

## 2013-10-07 DIAGNOSIS — Z23 Encounter for immunization: Secondary | ICD-10-CM

## 2013-10-07 DIAGNOSIS — Z331 Pregnant state, incidental: Secondary | ICD-10-CM

## 2013-10-07 DIAGNOSIS — Z1389 Encounter for screening for other disorder: Secondary | ICD-10-CM

## 2013-10-07 DIAGNOSIS — R81 Glycosuria: Secondary | ICD-10-CM

## 2013-10-07 DIAGNOSIS — Z3482 Encounter for supervision of other normal pregnancy, second trimester: Secondary | ICD-10-CM

## 2013-10-07 LAB — POCT URINALYSIS DIPSTICK
Nitrite, UA: NEGATIVE
Protein, UA: NEGATIVE

## 2013-10-07 LAB — GLUCOSE, POCT (MANUAL RESULT ENTRY): POC Glucose: 101 mg/dl — AB (ref 70–99)

## 2013-10-07 MED ORDER — INFLUENZA VAC SPLIT QUAD 0.5 ML IM SUSP
0.5000 mL | Freq: Once | INTRAMUSCULAR | Status: AC
  Administered 2013-10-07: 0.5 mL via INTRAMUSCULAR

## 2013-10-07 NOTE — Progress Notes (Signed)
No pt concerns, no bleeding, contraction. BC; DepoProvera

## 2013-10-14 ENCOUNTER — Ambulatory Visit (INDEPENDENT_AMBULATORY_CARE_PROVIDER_SITE_OTHER): Payer: Medicaid Other | Admitting: Obstetrics and Gynecology

## 2013-10-14 ENCOUNTER — Encounter (INDEPENDENT_AMBULATORY_CARE_PROVIDER_SITE_OTHER): Payer: Self-pay

## 2013-10-14 VITALS — BP 122/70 | Wt 182.0 lb

## 2013-10-14 DIAGNOSIS — Z331 Pregnant state, incidental: Secondary | ICD-10-CM

## 2013-10-14 DIAGNOSIS — Z1389 Encounter for screening for other disorder: Secondary | ICD-10-CM

## 2013-10-14 DIAGNOSIS — K529 Noninfective gastroenteritis and colitis, unspecified: Secondary | ICD-10-CM | POA: Insufficient documentation

## 2013-10-14 DIAGNOSIS — O99019 Anemia complicating pregnancy, unspecified trimester: Secondary | ICD-10-CM

## 2013-10-14 LAB — POCT URINALYSIS DIPSTICK
Blood, UA: NEGATIVE
Ketones, UA: NEGATIVE

## 2013-10-14 MED ORDER — METOCLOPRAMIDE HCL 10 MG PO TABS: 10.0000 mg | ORAL_TABLET | Freq: Four times a day (QID) | ORAL | Status: DC

## 2013-10-14 NOTE — Progress Notes (Signed)
WI for nausea and vomiting, fever 99.3 yesterday, abdominal cramps, headaches in back of had. Pt states taking tylenol as needed.

## 2013-10-14 NOTE — Addendum Note (Signed)
Addended by: Tilda Burrow on: 10/14/2013 10:47 AM   Modules accepted: Orders

## 2013-10-14 NOTE — Patient Instructions (Signed)
Take her Reglan tablet was a small sip of water 30 minutes before each plan meal

## 2013-10-14 NOTE — Progress Notes (Signed)
Chief complaint: 3 days of persistent nausea and vomiting after all Food intake. Positive low-grade temperature to 99.8 yesterday. Patient remains well hydrated voiding every 2-3 hours last bowel movement one day ago. Physical exam abdomen soft no guarding or rebound. Hypoactive bowel sounds all quadrants. Fetal heart rate normal uterus nontender no uterine contractions Urinalysis negative.  Impression: Hypoactive bowel sounds but with no evidence of acute abdomen We'll check CBC and b met, prescribed Reglan 10 mg a.c. Followup 4 days or earlier when necessary in change in condition

## 2013-10-15 ENCOUNTER — Telehealth: Payer: Self-pay | Admitting: Obstetrics and Gynecology

## 2013-10-15 NOTE — Telephone Encounter (Signed)
Spoke with pt. According to chart, Reglan was refilled yesterday. Advised to check with pharmacy and if they still don't have it, call me back. Pt voiced understanding. JSY

## 2013-10-15 NOTE — Telephone Encounter (Signed)
Left message x 1. JSY 

## 2013-10-18 ENCOUNTER — Inpatient Hospital Stay (HOSPITAL_COMMUNITY)
Admission: EM | Admit: 2013-10-18 | Discharge: 2013-10-18 | Disposition: A | Discharge status: Home / Self Care | Payer: Medicaid Other | Attending: Obstetrics & Gynecology | Admitting: Obstetrics & Gynecology

## 2013-10-18 ENCOUNTER — Inpatient Hospital Stay (HOSPITAL_COMMUNITY): Payer: Medicaid Other

## 2013-10-18 ENCOUNTER — Encounter (HOSPITAL_COMMUNITY): Payer: Self-pay | Admitting: Emergency Medicine

## 2013-10-18 DIAGNOSIS — R109 Unspecified abdominal pain: Secondary | ICD-10-CM

## 2013-10-18 DIAGNOSIS — B9689 Other specified bacterial agents as the cause of diseases classified elsewhere: Secondary | ICD-10-CM | POA: Insufficient documentation

## 2013-10-18 DIAGNOSIS — R1084 Generalized abdominal pain: Secondary | ICD-10-CM

## 2013-10-18 DIAGNOSIS — Z3482 Encounter for supervision of other normal pregnancy, second trimester: Secondary | ICD-10-CM

## 2013-10-18 DIAGNOSIS — A499 Bacterial infection, unspecified: Secondary | ICD-10-CM | POA: Insufficient documentation

## 2013-10-18 DIAGNOSIS — O479 False labor, unspecified: Secondary | ICD-10-CM

## 2013-10-18 DIAGNOSIS — N760 Acute vaginitis: Secondary | ICD-10-CM | POA: Insufficient documentation

## 2013-10-18 DIAGNOSIS — Z3490 Encounter for supervision of normal pregnancy, unspecified, unspecified trimester: Secondary | ICD-10-CM

## 2013-10-18 DIAGNOSIS — R1031 Right lower quadrant pain: Secondary | ICD-10-CM | POA: Insufficient documentation

## 2013-10-18 DIAGNOSIS — O2390 Unspecified genitourinary tract infection in pregnancy, unspecified trimester: Secondary | ICD-10-CM | POA: Insufficient documentation

## 2013-10-18 DIAGNOSIS — O2341 Unspecified infection of urinary tract in pregnancy, first trimester: Secondary | ICD-10-CM

## 2013-10-18 LAB — URINALYSIS, ROUTINE W REFLEX MICROSCOPIC
Bilirubin Urine: NEGATIVE
Ketones, ur: NEGATIVE mg/dL
Nitrite: NEGATIVE
Protein, ur: NEGATIVE mg/dL
Protein, ur: NEGATIVE mg/dL
Specific Gravity, Urine: 1.01 (ref 1.005–1.030)
Urobilinogen, UA: 0.2 mg/dL (ref 0.0–1.0)
Urobilinogen, UA: 0.2 mg/dL (ref 0.0–1.0)
pH: 7 (ref 5.0–8.0)

## 2013-10-18 LAB — CBC WITH DIFFERENTIAL/PLATELET
Lymphocytes Relative: 18 % (ref 12–46)
Lymphs Abs: 1.8 10*3/uL (ref 0.7–4.0)
Monocytes Absolute: 0.6 10*3/uL (ref 0.1–1.0)
Neutrophils Relative %: 75 % (ref 43–77)
Platelets: 240 10*3/uL (ref 150–400)
RBC: 3.44 MIL/uL — ABNORMAL LOW (ref 3.87–5.11)
WBC: 9.8 10*3/uL (ref 4.0–10.5)

## 2013-10-18 LAB — WET PREP, GENITAL: Yeast Wet Prep HPF POC: NONE SEEN

## 2013-10-18 LAB — URINE MICROSCOPIC-ADD ON

## 2013-10-18 MED ORDER — ONDANSETRON HCL 4 MG/2ML IJ SOLN
4.0000 mg | Freq: Once | INTRAMUSCULAR | Status: AC
  Administered 2013-10-18: 4 mg via INTRAVENOUS
  Filled 2013-10-18: qty 2

## 2013-10-18 MED ORDER — OXYCODONE-ACETAMINOPHEN 5-325 MG PO TABS
1.0000 | ORAL_TABLET | Freq: Once | ORAL | Status: DC
  Filled 2013-10-18: qty 1

## 2013-10-18 MED ORDER — ONDANSETRON HCL 4 MG PO TABS
4.0000 mg | ORAL_TABLET | Freq: Once | ORAL | Status: AC
  Administered 2013-10-18: 4 mg via ORAL
  Filled 2013-10-18: qty 1

## 2013-10-18 MED ORDER — NIFEDIPINE 10 MG PO CAPS
10.0000 mg | ORAL_CAPSULE | ORAL | Status: DC | PRN
  Administered 2013-10-18: 10 mg via ORAL
  Filled 2013-10-18: qty 1

## 2013-10-18 MED ORDER — OXYCODONE-ACETAMINOPHEN 5-325 MG PO TABS: 1.0000 | ORAL_TABLET | Freq: Four times a day (QID) | ORAL | Status: DC | PRN

## 2013-10-18 MED ORDER — METRONIDAZOLE 500 MG PO TABS: 500.0000 mg | ORAL_TABLET | Freq: Two times a day (BID) | ORAL | Status: DC

## 2013-10-18 MED ORDER — FENTANYL CITRATE 0.05 MG/ML IJ SOLN
50.0000 ug | Freq: Once | INTRAMUSCULAR | Status: AC
  Administered 2013-10-18: 50 ug via INTRAVENOUS
  Filled 2013-10-18: qty 2

## 2013-10-18 MED ORDER — LACTATED RINGERS IV BOLUS (SEPSIS)
1000.0000 mL | Freq: Once | INTRAVENOUS | Status: AC
  Administered 2013-10-18: 1000 mL via INTRAVENOUS

## 2013-10-18 NOTE — MAU Provider Note (Signed)
History     CSN: [id number]  Arrival date and time: 10/18/13 0228   None     Chief Complaint  Patient presents with  . Abdominal Cramping  . Flank Pain  . Emesis During Pregnancy   HPI  [name] is a 32 y.o G4P3003 who presents at 25w4 as transfer from [facility] for right flank pain, nausea and contractions. Per pt, has had nausea throughout pregnancy but started this previous Sunday prompting pt to visit OB office on Thursday. Also was assc with mild low grade temps at home. Was given rx for reglan. Pt seemed to improve but yesterday 10/26 developed intermittent sharp right flank pain, radiating to the RLQ, unrelieved by rest. Still with continued nausea, no emesis. Pt does have hx of kidney stones but believes this pain to be different and worse in severity than that. Denies vaginal bleeding or LOF. Still feeling baby move well. Also attests to some white mucousy discharge but unchanged from pregnancy course. Denies dysuria or hematuria but attests to some hesitancy with urination. Has had nml soft BMs.   PNC at [facility]. Anatomic US showed bilat hydro at 20 wks. Of note pt was admitted at end of September for preterm contractions. Was loaded with indocin and then admitted to antepartum for po meds and rocephin given hematuria. Does have a hx of UTIs was treated with macrobid 06/16 with negative cultures  OB History   Grav Para Term Preterm Abortions TAB SAB Ect Mult Living   4 3 3       3       Past Medical History  Diagnosis Date  . Pregnant   . Kidney stones     Past Surgical History  Procedure Laterality Date  . Kidney stents    . Lithotripsy    . Appendectomy      Family History  Problem Relation Age of Onset  . Cancer Mother     breast  . Diabetes Maternal Grandmother     History  Substance Use Topics  . Smoking status: Never Smoker   . Smokeless tobacco: Never Used  . Alcohol Use: No    Allergies: No Known Allergies  Prescriptions prior to admission   Medication Sig Dispense Refill  . acetaminophen (TYLENOL) 325 MG tablet Take 650 mg by mouth every 6 (six) hours as needed for pain or fever.      . metoCLOPramide (REGLAN) 10 MG tablet Take 1 tablet (10 mg total) by mouth 4 (four) times daily.  30 tablet  1  . omeprazole (PRILOSEC) 20 MG capsule Take 1 capsule (20 mg total) by mouth daily.  30 capsule  6  . prenatal vitamin w/FE, FA (PRENATAL 1 + 1) 27-1 MG TABS Take 1 tablet by mouth daily at 12 noon.  30 each  12    Review of Systems  Constitutional: Positive for fever, chills and malaise/fatigue. Negative for diaphoresis.  HENT: Negative for congestion and sore throat.   Eyes: Negative for blurred vision and pain.  Respiratory: Negative for cough, hemoptysis, shortness of breath and wheezing.   Cardiovascular: Negative for chest pain and leg swelling.  Gastrointestinal: Positive for heartburn, nausea and abdominal pain. Negative for vomiting, diarrhea and constipation.  Genitourinary: Positive for flank pain. Negative for dysuria, urgency and frequency.  Musculoskeletal: Negative for myalgias.  Skin: Negative for rash.  Neurological: Negative for dizziness, loss of consciousness, weakness and headaches.  Endo/Heme/Allergies: Does not bruise/bleed easily.  Psychiatric/Behavioral: Negative for depression.   Physical Exam  Blood pressure 129/73, pulse 93, temperature 98 F (36.7 C), temperature source Oral, resp. rate 18, last menstrual period 04/10/2013, SpO2 97.00%.  Physical Exam  Constitutional: She is oriented to person, place, and time. She appears well-developed and well-nourished. No distress.  HENT:  Head: Normocephalic and atraumatic.  Eyes: EOM are normal.  Neck: Neck supple.  Cardiovascular: Normal rate, regular rhythm and normal heart sounds.   No murmur heard. Respiratory: Effort normal and breath sounds normal. No respiratory distress.  GI: Soft. She exhibits distension (gravid). There is tenderness (right CVA,  RLQ ). There is no rebound and no guarding.  Genitourinary: Vaginal discharge found.  Musculoskeletal: Normal range of motion. She exhibits no edema.  Neurological: She is alert and oriented to person, place, and time.  Skin: Skin is warm and dry. No erythema.  Psychiatric: She has a normal mood and affect. Her behavior is normal.    MAU Course  Procedures  MDM U/A- repeated (dirty catch) with culture Wet prep GCCT Renal US Cervical length LR bolus IV zofran, and fentanyl  Assessment and Plan  Pt is a 32y.o [id number] at 25w4 who presents with flank pain with radiation to the RLQ  1. Flank pain/abdominal pain - ddx including stone vs pyelo vs MSK strain vs early onset labor. Repeat U/A neg. CBC without white count. Pt afebrile. Renal US without evidence of stone or hydro. Transvag US official read pending but cervical length appears adequate 3.8. Wet prep with few clues.  -monitoring for contraction pattern, if spaced out can consider d/c home with preterm labor precautions -treat BV with flagyl 500 BID 7 days -rec tylenol and hot compress for flank pain  2. Nausea- s/p 1 L bolus LR and IV zofran -can restart home med phenergan   3. FHR- reassuring -cat I tracing   4. Prenatal care -f/up at [facility] for scheduled appointment -undecided abt feeding -depo for contraception   Anselm Lis 10/18/2013, 6:11 AM   Agree wth H&P above. In short. 1 week of nausea with ?low grade fevers at home. Improved with reglan initially but started having severe right flank pain that radiates to front with n/v over last several days. Pt has had hx of kidney stones previous preg on left. No fevers currently.  PMhx: Prev kidney stones, otherwise unremarkable PSHx None  PE: Filed Vitals:   10/18/13 0323 10/18/13 0420 10/18/13 0720 10/18/13 0809  BP: 132/86 129/73 128/70 131/80  Pulse: 103 93 89 99  Temp:  98 F (36.7 C) 98.2 F (36.8 C)   TempSrc:  Oral Oral   Resp: 22 18 18    SpO2: 97%       VSS NAD RRR no mgt CTAB no wrc NO CVAT, mild tenderness on right flank and sore on right middle quadrant No uterine tenderness, mild epigastric tenderness No lower extremity swelling  Dilation: Closed Effacement (%): Thick Cervical Position: Posterior Station: -3 Exam by:: Dr. Ike Bene   CBC    Component Value Date/Time   WBC 9.8 10/18/2013 0715   RBC 3.44* 10/18/2013 0715   HGB 9.6* 10/18/2013 0715   HCT 29.3* 10/18/2013 0715   PLT 240 10/18/2013 0715   MCV 85.2 10/18/2013 0715   MCH 27.9 10/18/2013 0715   MCHC 32.8 10/18/2013 0715   RDW 14.1 10/18/2013 0715   LYMPHSABS 1.8 10/18/2013 0715   MONOABS 0.6 10/18/2013 0715   EOSABS 0.1 10/18/2013 0715   BASOSABS 0.0 10/18/2013 0715   Results for KERRI-ANNE, HAEBERLE (MRN [id number]) as of  10/18/2013 09:51  Ref. Range 10/18/2013 04:10 10/18/2013 05:40  Yeast Wet Prep HPF POC Latest Range: NONE SEEN   NONE SEEN  Trich, Wet Prep Latest Range: NONE SEEN   NONE SEEN  Clue Cells Wet Prep HPF POC Latest Range: NONE SEEN   FEW (A)  WBC, Wet Prep HPF POC Latest Range: NONE SEEN   FEW (A)  Color, Urine Latest Range: YELLOW  YELLOW   APPearance Latest Range: CLEAR  CLEAR   Specific Gravity, Urine Latest Range: 1.005-1.030  1.010   pH Latest Range: 5.0-8.0  7.0   Glucose Latest Range: NEGATIVE mg/dL NEGATIVE   Bilirubin Urine Latest Range: NEGATIVE  NEGATIVE   Ketones, ur Latest Range: NEGATIVE mg/dL NEGATIVE   Protein Latest Range: NEGATIVE mg/dL NEGATIVE   Urobilinogen, UA Latest Range: 0.0-1.0 mg/dL 0.2   Nitrite Latest Range: NEGATIVE  NEGATIVE   Leukocytes, UA Latest Range: NEGATIVE  NEGATIVE   Hgb urine dipstick Latest Range: NEGATIVE  NEGATIVE    [place]: CL 3.8 No hydronephrosis on Right  FHT: 120s, mod var Mult accels no decels Toco: contraction abnormal pattern with very steep sidewalls sugestive of maternal effort.  A/P Dyanara L Mayol is a 32 y.o. G4P3003 at [redacted]w[redacted]d presents with n/v with flank pain.  +BV  will treat. Agree with A/P as above. Will encourage pt be seen this week at [facility] and to return for PTL, fevers or inability to tolerate PO.

## 2013-10-18 NOTE — ED Notes (Signed)
Pt reports "contractions every 45 minutes" since yesterday with right sided flank pain and emesis "whenever I eat or drink anything". Pt reports she was told by the nurse to "come here for evaluation and if you need to send me to women's you will."

## 2013-10-18 NOTE — Progress Notes (Signed)
Call from [facility] pt presents c/o continued nausea and abd discomfort without relief.

## 2013-10-18 NOTE — ED Notes (Signed)
Pt reports seeing her OB-GYN on Thursday for nausea. Given Reglan. Reports it is not helping and Tylenol is not reducing the pain.

## 2013-10-18 NOTE — MAU Note (Signed)
Pt transferred from [facility] for right flank pain and ctx. Pt still c/o of right flank pain. Place on monitor.

## 2013-10-18 NOTE — Progress Notes (Signed)
Call to [facility] RN, suggest EDP call Dr Marice Potter to discuss care. Pt has been seen many times during this pregnancy for PTL and N&V

## 2013-10-18 NOTE — Progress Notes (Signed)
Patient was tensing abdomen and causing sharp spikes and squared off images to appear on tracing every 3-4 minutes. Instructed patient to relax abdomen. Informed patient that what she was doing was making it difficult to really assess her for contractions. RN stayed in room with hand on abdomen. No UC's palpated. Squared off images stopped. ? UI noted, but not palpable. When RN left room, sharp peaks and squares resumed. Again advised patient to stop tensing abdomen. Showed patient tracing and explained further. Sharp peaks and squares stopped again. When RN in room, no UC's noted or palpated.

## 2013-10-18 NOTE — ED Provider Notes (Signed)
CSN: [id number]     Arrival date & time 10/18/13  0228 History   First MD Initiated Contact with Patient 10/18/13 0231     Chief Complaint  Patient presents with  . Abdominal Cramping  . Flank Pain  . Emesis During Pregnancy    Patient is a 32 y.o. female presenting with flank pain. The history is provided by the patient.  Flank Pain This is a new problem. Episode onset: earlier tonight. The problem occurs constantly. The problem has been gradually worsening. Associated symptoms include abdominal pain. Pertinent negatives include no chest pain, no headaches and no shortness of breath. Nothing aggravates the symptoms. Nothing relieves the symptoms.  pt is a G4P3 at 25 weeks presenting with right flank pain, nausea as well as contractions She reports she has had recent contractions and has been evaluated by her OB No cp/sob No falls No vag bleeding No fluid loss +fetal movement reported No fever No dysuria She reports some LE edema No significant HA reported   Past Medical History  Diagnosis Date  . Pregnant   . Kidney stones    Past Surgical History  Procedure Laterality Date  . Kidney stents    . Lithotripsy    . Appendectomy     Family History  Problem Relation Age of Onset  . Cancer Mother     breast  . Diabetes Maternal Grandmother    History  Substance Use Topics  . Smoking status: Never Smoker   . Smokeless tobacco: Never Used  . Alcohol Use: No   OB History   Grav Para Term Preterm Abortions TAB SAB Ect Mult Living   4 3 3       3      Review of Systems  Constitutional: Negative for fever.  Respiratory: Negative for shortness of breath.   Cardiovascular: Negative for chest pain.  Gastrointestinal: Positive for abdominal pain.  Genitourinary: Positive for flank pain. Negative for dysuria.  Neurological: Negative for weakness and headaches.  All other systems reviewed and are negative.    Allergies  Review of patient's allergies indicates no known  allergies.  Home Medications   Current Outpatient Rx  Name  Route  Sig  Dispense  Refill  . acetaminophen (TYLENOL) 325 MG tablet   Oral   Take 650 mg by mouth every 6 (six) hours as needed for pain or fever.         . metoCLOPramide (REGLAN) 10 MG tablet   Oral   Take 1 tablet (10 mg total) by mouth 4 (four) times daily.   30 tablet   1   . omeprazole (PRILOSEC) 20 MG capsule   Oral   Take 1 capsule (20 mg total) by mouth daily.   30 capsule   6   . prenatal vitamin w/FE, FA (PRENATAL 1 + 1) 27-1 MG TABS   Oral   Take 1 tablet by mouth daily at 12 noon.   30 each   12    BP 140/88  Pulse 102  Temp(Src) 98.6 F (37 C) (Oral)  Resp 22  SpO2 99%  LMP 04/10/2013 Physical Exam CONSTITUTIONAL: Well developed/well nourished HEAD: Normocephalic/atraumatic EYES: EOMI/PERRL ENMT: Mucous membranes moist NECK: supple no meningeal signs SPINE:entire spine nontender CV: S1/S2 noted, no murmurs/rubs/gallops noted LUNGS: Lungs are clear to auscultation bilaterally, no apparent distress ABDOMEN: soft, gravid nontender, no rebound or guarding OZ:HYQM right cva tenderness, no cervical dilation noted on my eval. Sterile gloves used.  Female chaperone present.  No  vag bleeding noted.  No leakage of fluid noted.   NEURO: Pt is awake/alert, moves all extremitiesx4, no clonus noted bilaterally, no hyperreflexia noted EXTREMITIES: pulses normal, full ROM SKIN: warm, color normal PSYCH: no abnormalities of mood noted  ED Course  Procedures  Labs Review Labs Reviewed  URINALYSIS, ROUTINE W REFLEX MICROSCOPIC   Imaging Review No results found.  EKG Interpretation   None      3:20 AM Per nursing, there has been contractions on monitoring D/w dr dove at [facility], will transfer Pt was initially listed as 28 weeks, but after further review chart reveals she is around 25 weeks MDM   1. Pregnancy   2. Premature uterine contractions causing threatened premature labor, third  trimester    Nursing notes including past medical history and social history reviewed and considered in documentation Previous records reviewed and considered     Joya Gaskins, MD 10/18/13 [phone]

## 2013-10-19 ENCOUNTER — Encounter: Payer: Medicaid Other | Admitting: Women's Health

## 2013-10-19 ENCOUNTER — Encounter: Payer: Medicaid Other | Admitting: Obstetrics and Gynecology

## 2013-10-19 LAB — URINE CULTURE

## 2013-10-19 LAB — GC/CHLAMYDIA PROBE AMP: CT Probe RNA: NEGATIVE

## 2013-11-01 ENCOUNTER — Ambulatory Visit (INDEPENDENT_AMBULATORY_CARE_PROVIDER_SITE_OTHER): Payer: Medicaid Other | Admitting: Obstetrics & Gynecology

## 2013-11-01 ENCOUNTER — Encounter: Payer: Self-pay | Admitting: Obstetrics & Gynecology

## 2013-11-01 ENCOUNTER — Other Ambulatory Visit: Payer: Self-pay | Admitting: Obstetrics & Gynecology

## 2013-11-01 VITALS — BP 120/70 | Wt 179.0 lb

## 2013-11-01 DIAGNOSIS — O9989 Other specified diseases and conditions complicating pregnancy, childbirth and the puerperium: Secondary | ICD-10-CM

## 2013-11-01 DIAGNOSIS — Z331 Pregnant state, incidental: Secondary | ICD-10-CM

## 2013-11-01 DIAGNOSIS — N949 Unspecified condition associated with female genital organs and menstrual cycle: Secondary | ICD-10-CM

## 2013-11-01 DIAGNOSIS — O289 Unspecified abnormal findings on antenatal screening of mother: Secondary | ICD-10-CM

## 2013-11-01 DIAGNOSIS — O358XX1 Maternal care for other (suspected) fetal abnormality and damage, fetus 1: Secondary | ICD-10-CM

## 2013-11-01 DIAGNOSIS — Z1389 Encounter for screening for other disorder: Secondary | ICD-10-CM

## 2013-11-01 LAB — POCT URINALYSIS DIPSTICK
Ketones, UA: NEGATIVE
Leukocytes, UA: NEGATIVE
Nitrite, UA: NEGATIVE

## 2013-11-01 NOTE — Addendum Note (Signed)
Addended by: Colen Darling on: 11/01/2013 10:33 AM   Modules accepted: Orders

## 2013-11-01 NOTE — Progress Notes (Signed)
In as a work in Complaining of sharp pain just above the leg crease L>R radiates down the leg and into groin/vagina No bleeding no rom no contractions good fetal movement  Imp Round ligament pain   Rec prenatal cradle Keep scheduled

## 2013-11-04 ENCOUNTER — Ambulatory Visit (INDEPENDENT_AMBULATORY_CARE_PROVIDER_SITE_OTHER): Payer: Medicaid Other

## 2013-11-04 ENCOUNTER — Ambulatory Visit (INDEPENDENT_AMBULATORY_CARE_PROVIDER_SITE_OTHER): Payer: Medicaid Other | Admitting: Obstetrics & Gynecology

## 2013-11-04 VITALS — BP 120/70 | Temp 98.4°F | Wt 179.0 lb

## 2013-11-04 DIAGNOSIS — O99019 Anemia complicating pregnancy, unspecified trimester: Secondary | ICD-10-CM

## 2013-11-04 DIAGNOSIS — O358XX0 Maternal care for other (suspected) fetal abnormality and damage, not applicable or unspecified: Secondary | ICD-10-CM

## 2013-11-04 DIAGNOSIS — O289 Unspecified abnormal findings on antenatal screening of mother: Secondary | ICD-10-CM

## 2013-11-04 DIAGNOSIS — Z331 Pregnant state, incidental: Secondary | ICD-10-CM

## 2013-11-04 DIAGNOSIS — O358XX1 Maternal care for other (suspected) fetal abnormality and damage, fetus 1: Secondary | ICD-10-CM

## 2013-11-04 DIAGNOSIS — Z1389 Encounter for screening for other disorder: Secondary | ICD-10-CM

## 2013-11-04 LAB — POCT URINALYSIS DIPSTICK
Blood, UA: NEGATIVE
Glucose, UA: NEGATIVE
Ketones, UA: NEGATIVE
Nitrite, UA: NEGATIVE

## 2013-11-04 MED ORDER — AZITHROMYCIN 250 MG PO TABS: ORAL_TABLET | ORAL | Status: DC

## 2013-11-04 NOTE — Progress Notes (Signed)
Sonogram noted and report done, mild hydro persists BP weight and urine results all reviewed and noted. Patient reports good fetal movement, denies any bleeding and no rupture of membranes symptoms or regular contractions. Patient is without complaints. All questions were answered. Has congestion fever and cough z pak 1 week PNII 3 weeks ob sappt

## 2013-11-04 NOTE — Progress Notes (Signed)
U/S(28+0wks)-active vtx fetus, approp growth EFw 2 lb 11 oz(57th%tile), fluid wnl, anterior Gr 1 placenta, cx = 3.3cm closed, Lt mild hydronephrosis remains - 5.2mm, bladder volume WNL, Rt hydronephrosis WNL, female fetus "[name]"

## 2013-11-12 ENCOUNTER — Other Ambulatory Visit: Payer: Medicaid Other

## 2013-11-14 ENCOUNTER — Inpatient Hospital Stay (HOSPITAL_COMMUNITY)
Admission: AD | Admit: 2013-11-14 | Discharge: 2013-11-14 | Disposition: A | Discharge status: Home / Self Care | Payer: Medicaid Other | Source: Ambulatory Visit | Attending: Obstetrics & Gynecology | Admitting: Obstetrics & Gynecology

## 2013-11-14 ENCOUNTER — Encounter (HOSPITAL_COMMUNITY): Payer: Self-pay | Admitting: *Deleted

## 2013-11-14 DIAGNOSIS — O212 Late vomiting of pregnancy: Secondary | ICD-10-CM

## 2013-11-14 DIAGNOSIS — B3731 Acute candidiasis of vulva and vagina: Secondary | ICD-10-CM | POA: Insufficient documentation

## 2013-11-14 DIAGNOSIS — N949 Unspecified condition associated with female genital organs and menstrual cycle: Secondary | ICD-10-CM | POA: Insufficient documentation

## 2013-11-14 DIAGNOSIS — Z3482 Encounter for supervision of other normal pregnancy, second trimester: Secondary | ICD-10-CM

## 2013-11-14 DIAGNOSIS — O2390 Unspecified genitourinary tract infection in pregnancy, unspecified trimester: Secondary | ICD-10-CM | POA: Insufficient documentation

## 2013-11-14 DIAGNOSIS — B373 Candidiasis of vulva and vagina: Secondary | ICD-10-CM | POA: Insufficient documentation

## 2013-11-14 DIAGNOSIS — M549 Dorsalgia, unspecified: Secondary | ICD-10-CM | POA: Insufficient documentation

## 2013-11-14 LAB — URINALYSIS, ROUTINE W REFLEX MICROSCOPIC
Bilirubin Urine: NEGATIVE
Hgb urine dipstick: NEGATIVE
Ketones, ur: NEGATIVE mg/dL
Protein, ur: NEGATIVE mg/dL
Urobilinogen, UA: 0.2 mg/dL (ref 0.0–1.0)
pH: 7 (ref 5.0–8.0)

## 2013-11-14 LAB — WET PREP, GENITAL

## 2013-11-14 LAB — AMNISURE RUPTURE OF MEMBRANE (ROM) NOT AT ARMC: Amnisure ROM: NEGATIVE

## 2013-11-14 MED ORDER — ONDANSETRON HCL 4 MG/2ML IJ SOLN
4.0000 mg | Freq: Once | INTRAMUSCULAR | Status: AC
  Administered 2013-11-14: 4 mg via INTRAVENOUS
  Filled 2013-11-14: qty 2

## 2013-11-14 MED ORDER — CYCLOBENZAPRINE HCL 5 MG PO TABS: 5.0000 mg | ORAL_TABLET | Freq: Once | ORAL | Status: DC

## 2013-11-14 MED ORDER — PROMETHAZINE HCL 50 MG PO TABS: ORAL_TABLET | ORAL | Status: DC

## 2013-11-14 MED ORDER — ONDANSETRON 4 MG PO TBDP: 4.0000 mg | ORAL_TABLET | Freq: Once | ORAL | Status: DC

## 2013-11-14 MED ORDER — PROMETHAZINE HCL 25 MG/ML IJ SOLN
12.5000 mg | Freq: Four times a day (QID) | INTRAMUSCULAR | Status: DC | PRN
  Administered 2013-11-14: 12.5 mg via INTRAVENOUS
  Filled 2013-11-14: qty 1

## 2013-11-14 MED ORDER — LACTATED RINGERS IV BOLUS (SEPSIS)
500.0000 mL | Freq: Once | INTRAVENOUS | Status: AC
  Administered 2013-11-14: 500 mL via INTRAVENOUS

## 2013-11-14 MED ORDER — CYCLOBENZAPRINE HCL 5 MG PO TABS
5.0000 mg | ORAL_TABLET | Freq: Once | ORAL | Status: AC
  Administered 2013-11-14: 5 mg via ORAL
  Filled 2013-11-14: qty 1

## 2013-11-14 MED ORDER — FLUCONAZOLE 150 MG PO TABS: 150.0000 mg | ORAL_TABLET | Freq: Once | ORAL | Status: DC

## 2013-11-14 NOTE — MAU Provider Note (Signed)
History     CSN: [id number]  Arrival date and time: 11/14/13 2018   First Provider Initiated Contact with Patient 11/14/13 2052      Chief Complaint  Patient presents with  . Back Pain  . Emesis  . Pelvic Pain  . Rupture of Membranes   HPI Comments: Pt is a 32 y.o. G4P3003 at [redacted]w[redacted]d who presents with back pain, pelvic pain/pressure, and vomiting. Pt describes onset of symptoms 4 days ago with loss of mucous plug and gradually increasing back pain / pressure. Pt also started having vomiting yesterday. Pt describes a "watery" vaginal discharge and an occasional trickle of fluids when she is up and about, also started yesterday (has been resting in bed most of the past several days). Denies fever/chills.   Denies frank bleeding. Endorses good fetal movements, slightly decreased for a couple of days. Denies frank contractions but does describe some occasional "tightness and discomfort" that makes her back pain worse, which in turn increases her feelings of pelvic pressure.  Pt gets PNC at [facility]. Hospitalized in September for preterm contractions and treated for UTI at that time. Baby has right hydronephrosis. Denies complications, otherwise.  Back Pain This is a new problem. The current episode started in the past 7 days. The problem occurs constantly. The problem is unchanged. The quality of the pain is described as aching. The pain does not radiate. The symptoms are aggravated by coughing and position. Stiffness is present all day. Associated symptoms include pelvic pain. Pertinent negatives include no chest pain or fever. Risk factors include pregnancy. She has tried bed rest for the symptoms. The treatment provided no relief.  Emesis  The current episode started yesterday. The problem occurs intermittently. The problem has been unchanged. The emesis has an appearance of stomach contents. There has been no fever. Pertinent negatives include no chest pain or fever. She has tried nothing  for the symptoms.  Pelvic Pain The patient's primary symptoms include pelvic pain and a vaginal discharge. Primary symptoms comment: watery, trickle when standing/moving. This is a new problem. The current episode started in the past 7 days. The problem occurs constantly. The problem has been waxing and waning. The pain is moderate. The problem affects both sides. She is pregnant. Associated symptoms include back pain and vomiting. Pertinent negatives include no fever. The vaginal discharge was watery and thin (?lost mucous plug 3 days ago). There has been no bleeding. She has not been passing clots. She has not been passing tissue. She has tried nothing for the symptoms. The treatment provided no relief. Her menstrual history has been regular.    OB History   Grav Para Term Preterm Abortions TAB SAB Ect Mult Living   4 3 3       3       Past Medical History  Diagnosis Date  . Pregnant   . Kidney stones     Past Surgical History  Procedure Laterality Date  . Kidney stents    . Lithotripsy    . Appendectomy      Family History  Problem Relation Age of Onset  . Cancer Mother     breast  . Diabetes Maternal Grandmother     History  Substance Use Topics  . Smoking status: Never Smoker   . Smokeless tobacco: Never Used  . Alcohol Use: No    Allergies: No Known Allergies  Prescriptions prior to admission  Medication Sig Dispense Refill  . acetaminophen (TYLENOL) 325 MG tablet Take 650  mg by mouth every 6 (six) hours as needed for pain or fever.      Marland Kitchen azithromycin (ZITHROMAX Z-PAK) 250 MG tablet Take 2 tablets today and then 1 a day til finished  6 each  0  . metoCLOPramide (REGLAN) 10 MG tablet Take 1 tablet (10 mg total) by mouth 4 (four) times daily.  30 tablet  1  . metroNIDAZOLE (FLAGYL) 500 MG tablet Take 1 tablet (500 mg total) by mouth 2 (two) times daily.  14 tablet  0  . omeprazole (PRILOSEC) 20 MG capsule Take 1 capsule (20 mg total) by mouth daily.  30 capsule  6   . oxyCODONE-acetaminophen (PERCOCET/ROXICET) 5-325 MG per tablet Take 1 tablet by mouth every 6 (six) hours as needed for pain (caution).  10 tablet  0  . Prenatal Vit-Fe Fumarate-FA (PRENATAL MULTIVITAMIN) TABS tablet Take 1 tablet by mouth daily at 12 noon.        Review of Systems  Constitutional: Negative for fever.  Cardiovascular: Negative for chest pain.  Gastrointestinal: Positive for vomiting.  Genitourinary: Positive for vaginal discharge and pelvic pain.  Musculoskeletal: Positive for back pain.  : As above.  Physical Exam   Blood pressure 137/70, pulse 119, temperature 98.8 F (37.1 C), temperature source Oral, resp. rate 20, height 5\' 3"  (1.6 m), weight 81.194 kg (179 lb), last menstrual period 04/10/2013, SpO2 100.00%.  Physical Exam  Nursing note and vitals reviewed. Constitutional: She is oriented to person, place, and time. She appears well-developed and well-nourished.  Not frankly distressed but uncomfortable, vomiting intermittently   HENT:  Head: Normocephalic and atraumatic.  Eyes: EOM are normal.  Neck: Normal range of motion. Neck supple.  Cardiovascular: Normal rate, regular rhythm and normal heart sounds.   No murmur heard. GI: Soft. Bowel sounds are normal. There is no tenderness.  gravid  Genitourinary: Vagina normal.  Speculum exam: moderate amount of yellowish discharge, slightly thinner than typical yeast discharge, no definite pooling. Some mucous discharge from cervix but no frank bleeding.  Dilation: Closed (external os 0.5) Effacement (%): Thick Station: Ballotable Exam by:: Dr. Casper Harrison   Musculoskeletal: Normal range of motion. She exhibits tenderness (paraspinal muscle tightness/tenderness, especially low-lumbar).  Neurological: She is alert and oriented to person, place, and time.  Skin: Skin is warm and dry.  Psychiatric: She has a normal mood and affect. Her behavior is normal.    MAU Course  Procedures  MDM 2100 - vomiting but  otherwise not in distress, ordered for LR bolus 500 mL and Zofran IV once. 2130 - Speculum exam as above. Fern equivocal, will check Amnisure. Wet prep and FFN also pending. More comfortable after bolus and Zofran but still nauseated, small amount of vomiting. 2210 - Amnisure negative. Wet prep negative but with clinical appearance of discharge, will favor treatment with Diflucan.  2230 - Cervix exam above. Will give Phenergan IV and Flexeril PO. FHR tracing reassuring, rate 150 with mod variability, good accels, no decels. Some uterine irritability but no frank contractions.  Assessment and Plan  32 y.o. G4P3003 at [redacted]w[redacted]d presenting with vomiting, pelvic pressure / pain for several days -SIUP at 29 weeks, not in labor and not ruptured with back / pelvic pain in third trimester in multiparous female -treated in MAU with IV hydration, antiemetics, and Flexeril for symptoms -Rx for Phenergan and Flexeril for discharge -Rx for Diflucan 150 mg PO once for presumed yeast infection -reviewed preterm labor precautions and instructed to f/u with [facility] clinic this week  Above discussed with Roney Marion in its entirety.  Bobbye Morton, MD PGY-2, [facility] 11/14/2013, 10:40 PM

## 2013-11-14 NOTE — MAU Note (Signed)
Pt reports lower back and vaginal pain since Thursday night, vaginal pressure and watery discharge since last pm

## 2013-11-14 NOTE — MAU Provider Note (Signed)
I examined pt and agree with documentation above and resident plan of care. [name],[name]  

## 2013-11-15 ENCOUNTER — Encounter: Payer: Self-pay | Admitting: *Deleted

## 2013-11-15 ENCOUNTER — Other Ambulatory Visit: Payer: Medicaid Other

## 2013-11-22 ENCOUNTER — Encounter (HOSPITAL_COMMUNITY): Payer: Self-pay | Admitting: General Practice

## 2013-11-22 ENCOUNTER — Inpatient Hospital Stay (HOSPITAL_COMMUNITY)
Admission: AD | Admit: 2013-11-22 | Discharge: 2013-11-23 | Disposition: A | Discharge status: Home / Self Care | Payer: Medicaid Other | Source: Ambulatory Visit | Attending: Obstetrics & Gynecology | Admitting: Obstetrics & Gynecology

## 2013-11-22 DIAGNOSIS — O4700 False labor before 37 completed weeks of gestation, unspecified trimester: Secondary | ICD-10-CM | POA: Insufficient documentation

## 2013-11-22 DIAGNOSIS — O4703 False labor before 37 completed weeks of gestation, third trimester: Secondary | ICD-10-CM

## 2013-11-22 DIAGNOSIS — O212 Late vomiting of pregnancy: Secondary | ICD-10-CM | POA: Insufficient documentation

## 2013-11-22 LAB — URINALYSIS, ROUTINE W REFLEX MICROSCOPIC
Leukocytes, UA: NEGATIVE
Nitrite: NEGATIVE
Specific Gravity, Urine: 1.015 (ref 1.005–1.030)
Urobilinogen, UA: 0.2 mg/dL (ref 0.0–1.0)

## 2013-11-22 MED ORDER — ONDANSETRON HCL 4 MG/2ML IJ SOLN
4.0000 mg | Freq: Once | INTRAMUSCULAR | Status: AC
  Administered 2013-11-22: 4 mg via INTRAVENOUS
  Filled 2013-11-22: qty 2

## 2013-11-22 MED ORDER — LACTATED RINGERS IV BOLUS (SEPSIS)
500.0000 mL | Freq: Once | INTRAVENOUS | Status: AC
  Administered 2013-11-22: 500 mL via INTRAVENOUS

## 2013-11-22 NOTE — MAU Note (Signed)
Pt presents to MAU with c/o contractions and nausea & vomiting today.

## 2013-11-22 NOTE — MAU Provider Note (Signed)
History    CSN: [id number] Arrival date and time: 11/22/13 2139 No chief complaint on file.  HPI  [name] is a 32 y.o. G4P3003 at [redacted]w[redacted]d with increasing contractions and nausea with vomiting.   Beginning this morning, she reports painful contractions and pressure in her back, increasing in frequency causing nausea and vomiting starting at lunch. She has taken phenergan po without relief. The pain in her back is on the right side near the midline, not relieved by tylenol or a warm bath. No dysuria, diarrhea, constipation, HA, vision changes, abd pain, swelling.  She denies VB, LOF. + GFM. Denies any intercourse/cervical checks in the past 48 hrs.   Pt gets PNC at [facility]. Hospitalized in September for preterm contractions and treated for UTI at that time. Fetal  right hydronephrosis noted on 20w U/S   OB History   Grav Para Term Preterm Abortions TAB SAB Ect Mult Living   4 3 3       3       Past Medical History  Diagnosis Date  . Pregnant   . Kidney stones     Past Surgical History  Procedure Laterality Date  . Kidney stents    . Lithotripsy    . Appendectomy      Family History  Problem Relation Age of Onset  . Cancer Mother     breast  . Diabetes Maternal Grandmother     History  Substance Use Topics  . Smoking status: Never Smoker   . Smokeless tobacco: Never Used  . Alcohol Use: No    Allergies: No Known Allergies  Prescriptions prior to admission  Medication Sig Dispense Refill  . acetaminophen (TYLENOL) 325 MG tablet Take 650 mg by mouth every 6 (six) hours as needed for pain or fever.      Marland Kitchen azithromycin (ZITHROMAX Z-PAK) 250 MG tablet Take 2 tablets today and then 1 a day til finished  6 each  0  . cyclobenzaprine (FLEXERIL) 5 MG tablet Take 1 tablet (5 mg total) by mouth once.  30 tablet  1  . fluconazole (DIFLUCAN) 150 MG tablet Take 1 tablet (150 mg total) by mouth once. For yeast infection.  1 tablet  0  . metoCLOPramide (REGLAN)  10 MG tablet Take 1 tablet (10 mg total) by mouth 4 (four) times daily.  30 tablet  1  . metroNIDAZOLE (FLAGYL) 500 MG tablet Take 1 tablet (500 mg total) by mouth 2 (two) times daily.  14 tablet  0  . omeprazole (PRILOSEC) 20 MG capsule Take 1 capsule (20 mg total) by mouth daily.  30 capsule  6  . oxyCODONE-acetaminophen (PERCOCET/ROXICET) 5-325 MG per tablet Take 1 tablet by mouth every 6 (six) hours as needed for pain (caution).  10 tablet  0  . Prenatal Vit-Fe Fumarate-FA (PRENATAL MULTIVITAMIN) TABS tablet Take 1 tablet by mouth daily at 12 noon.      . promethazine (PHENERGAN) 50 MG tablet Take one half OR one whole tablet every 6 hours as needed for nausea or vomiting.  30 tablet  1    ROS Physical Exam   Last menstrual period 04/10/2013.  Physical Exam  Constitutional: She is oriented to person, place, and time. She appears well-developed and well-nourished. No distress.  HENT:  Head: Normocephalic and atraumatic.  Eyes: Conjunctivae and EOM are normal. No scleral icterus.  Neck: Normal range of motion. Neck supple.  Cardiovascular: Normal heart sounds and intact distal pulses.  Exam reveals no gallop.  No murmur heard. Respiratory: Effort normal and breath sounds normal. No respiratory distress. She has no rales.  GI: Soft. Bowel sounds are normal. There is no tenderness.  Genitourinary: Vagina normal. Cervix exhibits no discharge. No vaginal discharge found.  Cervix is closed on visual inspection and on cervical exam.   Musculoskeletal: Normal range of motion. She exhibits no edema.  Neurological: She is alert and oriented to person, place, and time. She has normal reflexes.  SSE: No discharge, cervix posterior and closed Cervical exam: closed/thick/high  FHT: 145 baseline, moderate variability, accels present, decels absent Toco: Contractions irregular, every 5-21min with steep up-slope and gradual down-sloping, not able to be palpated by RN.     11/22/2013 22:05  Color,  Urine YELLOW  APPearance CLEAR  Specific Gravity, Urine 1.015  pH 7.0  Glucose NEGATIVE  Bilirubin Urine NEGATIVE  Ketones, ur NEGATIVE  Protein NEGATIVE  Urobilinogen, UA 0.2  Nitrite NEGATIVE  Leukocytes, UA NEGATIVE  Hgb urine dipstick NEGATIVE    MAU Course  Procedures Fetal fibronectin obtained, discarded as SSE shows visibly closed cervix without discharge. Cervical exam: closed/thick/high.  No evidence of UTI on U/A  Giving bolus LR , zofran IV  Contractions with odd morphology continue on tocometer, no contractions palpated. ?Due to abdominal flexion.  Assessment and Plan  [name] is a 32 y.o. [id number] at [redacted]w[redacted]d with preterm contractions without cervical change.   - [facility]UP at [redacted]w[redacted]d in multiparous female with preterm contractions - Treated with IVF, anti-emetic - Procardia x1 here, Rx sent to pharmacy - BMZ x1, to return for 2nd dose tomorrow - To keep 12/4 appt at [facility]  - Reviewed preterm labor precautions, kick counts.   Hazeline Junker 11/22/2013, 10:09 PM   I have seen and examined this patient and agree with above documentation in the resident's note. Pt presents with contractions for the last 12 hours. On exam her cervix was closed/thick/high. She appeared comfortable.  Contractions with odd morphology on toco and when adjusting monitor, no contractions were palpated. Pt has now been seen 3 times in the MAU for similar complaints and admitted once for PT contractions.  Given her track record, will go ahead and start daily procardia. Will give first dose of BMZ here and pt to return to MAU tomorrow for second dose. FWB- cat I tracing. F/u as scheduled on Thursday at [facility].   Rulon Abide, M.D. [facility] Fellow 11/23/2013 12:55 AM

## 2013-11-23 ENCOUNTER — Inpatient Hospital Stay (HOSPITAL_COMMUNITY)
Admission: AD | Admit: 2013-11-23 | Discharge: 2013-11-24 | Disposition: A | Discharge status: Home / Self Care | Payer: Medicaid Other | Source: Ambulatory Visit | Attending: Obstetrics & Gynecology | Admitting: Obstetrics & Gynecology

## 2013-11-23 DIAGNOSIS — Z3482 Encounter for supervision of other normal pregnancy, second trimester: Secondary | ICD-10-CM

## 2013-11-23 DIAGNOSIS — O4700 False labor before 37 completed weeks of gestation, unspecified trimester: Secondary | ICD-10-CM | POA: Insufficient documentation

## 2013-11-23 DIAGNOSIS — O2341 Unspecified infection of urinary tract in pregnancy, first trimester: Secondary | ICD-10-CM

## 2013-11-23 DIAGNOSIS — O479 False labor, unspecified: Secondary | ICD-10-CM

## 2013-11-23 MED ORDER — BETAMETHASONE SOD PHOS & ACET 6 (3-3) MG/ML IJ SUSP
12.0000 mg | Freq: Once | INTRAMUSCULAR | Status: AC
  Administered 2013-11-23: 12 mg via INTRAMUSCULAR
  Filled 2013-11-23: qty 2

## 2013-11-23 MED ORDER — NIFEDIPINE ER 30 MG PO TB24: 30.0000 mg | ORAL_TABLET | Freq: Every day | ORAL | Status: DC

## 2013-11-23 MED ORDER — BETAMETHASONE SOD PHOS & ACET 6 (3-3) MG/ML IJ SUSP
12.0000 mg | Freq: Once | INTRAMUSCULAR | Status: AC
  Administered 2013-11-24: 12 mg via INTRAMUSCULAR
  Filled 2013-11-23: qty 2

## 2013-11-23 MED ORDER — NIFEDIPINE ER OSMOTIC RELEASE 30 MG PO TB24
30.0000 mg | ORAL_TABLET | Freq: Once | ORAL | Status: AC
  Administered 2013-11-23: 30 mg via ORAL
  Filled 2013-11-23: qty 1

## 2013-11-23 NOTE — MAU Note (Signed)
Here for follow up BMZ injection

## 2013-11-23 NOTE — MAU Provider Note (Signed)

## 2013-11-25 ENCOUNTER — Ambulatory Visit (INDEPENDENT_AMBULATORY_CARE_PROVIDER_SITE_OTHER): Payer: Medicaid Other | Admitting: Obstetrics & Gynecology

## 2013-11-25 ENCOUNTER — Encounter: Payer: Self-pay | Admitting: Obstetrics & Gynecology

## 2013-11-25 VITALS — BP 130/80 | Wt 183.0 lb

## 2013-11-25 DIAGNOSIS — Z331 Pregnant state, incidental: Secondary | ICD-10-CM

## 2013-11-25 DIAGNOSIS — Z1389 Encounter for screening for other disorder: Secondary | ICD-10-CM

## 2013-11-25 DIAGNOSIS — Z3483 Encounter for supervision of other normal pregnancy, third trimester: Secondary | ICD-10-CM

## 2013-11-25 DIAGNOSIS — O99019 Anemia complicating pregnancy, unspecified trimester: Secondary | ICD-10-CM

## 2013-11-25 LAB — POCT URINALYSIS DIPSTICK
Glucose, UA: NEGATIVE
Leukocytes, UA: NEGATIVE
Nitrite, UA: NEGATIVE

## 2013-11-25 NOTE — Progress Notes (Signed)
Recommend Melatonin 3-6 mg for sleep and if still needs something try OTC benadryl 25 mg BP weight and urine results all reviewed and noted. Patient reports good fetal movement, denies any bleeding and no rupture of membranes symptoms or regular contractions. Patient is without complaints. All questions were answered.

## 2013-11-25 NOTE — Addendum Note (Signed)
Addended by: Colen Darling on: 11/25/2013 10:39 AM   Modules accepted: Orders

## 2013-11-28 ENCOUNTER — Encounter (HOSPITAL_COMMUNITY): Payer: Self-pay | Admitting: *Deleted

## 2013-11-28 ENCOUNTER — Inpatient Hospital Stay (HOSPITAL_COMMUNITY)
Admission: AD | Admit: 2013-11-28 | Discharge: 2013-11-28 | Disposition: A | Discharge status: Home / Self Care | Payer: Medicaid Other | Source: Ambulatory Visit | Attending: Obstetrics & Gynecology | Admitting: Obstetrics & Gynecology

## 2013-11-28 DIAGNOSIS — O4700 False labor before 37 completed weeks of gestation, unspecified trimester: Secondary | ICD-10-CM | POA: Insufficient documentation

## 2013-11-28 DIAGNOSIS — O368190 Decreased fetal movements, unspecified trimester, not applicable or unspecified: Secondary | ICD-10-CM | POA: Insufficient documentation

## 2013-11-28 DIAGNOSIS — O479 False labor, unspecified: Secondary | ICD-10-CM

## 2013-11-28 DIAGNOSIS — K219 Gastro-esophageal reflux disease without esophagitis: Secondary | ICD-10-CM | POA: Insufficient documentation

## 2013-11-28 DIAGNOSIS — O4703 False labor before 37 completed weeks of gestation, third trimester: Secondary | ICD-10-CM

## 2013-11-28 DIAGNOSIS — O99891 Other specified diseases and conditions complicating pregnancy: Secondary | ICD-10-CM | POA: Insufficient documentation

## 2013-11-28 DIAGNOSIS — O212 Late vomiting of pregnancy: Secondary | ICD-10-CM | POA: Insufficient documentation

## 2013-11-28 LAB — URINALYSIS, ROUTINE W REFLEX MICROSCOPIC
Bilirubin Urine: NEGATIVE
Nitrite: NEGATIVE
Specific Gravity, Urine: <1.005 — ABNORMAL LOW (ref 1.005–1.030)
Urobilinogen, UA: 0.2 mg/dL (ref 0.0–1.0)

## 2013-11-28 LAB — WET PREP, GENITAL
Clue Cells Wet Prep HPF POC: NONE SEEN
Trich, Wet Prep: NONE SEEN
Yeast Wet Prep HPF POC: NONE SEEN

## 2013-11-28 MED ORDER — NIFEDIPINE ER 30 MG PO TB24: 30.0000 mg | ORAL_TABLET | Freq: Two times a day (BID) | ORAL | Status: DC

## 2013-11-28 MED ORDER — PANTOPRAZOLE SODIUM 40 MG IV SOLR
40.0000 mg | Freq: Once | INTRAVENOUS | Status: AC
  Administered 2013-11-28: 40 mg via INTRAVENOUS
  Filled 2013-11-28: qty 40

## 2013-11-28 MED ORDER — ONDANSETRON HCL 4 MG/2ML IJ SOLN
4.0000 mg | Freq: Once | INTRAMUSCULAR | Status: AC
  Administered 2013-11-28: 4 mg via INTRAVENOUS
  Filled 2013-11-28: qty 2

## 2013-11-28 MED ORDER — LACTATED RINGERS IV BOLUS (SEPSIS)
1000.0000 mL | Freq: Once | INTRAVENOUS | Status: AC
  Administered 2013-11-28: 1000 mL via INTRAVENOUS

## 2013-11-28 MED ORDER — NIFEDIPINE 10 MG PO CAPS
10.0000 mg | ORAL_CAPSULE | ORAL | Status: AC | PRN
  Administered 2013-11-28 (×3): 10 mg via ORAL
  Filled 2013-11-28 (×3): qty 1

## 2013-11-28 NOTE — MAU Provider Note (Signed)
[name] is a 32 y.o. G42P3003 female at [redacted]w[redacted]d by 6wk u/s, presenting for report of uc's since Fri, worsening tonight. Decreased fm last few hours. Vomiting x 4 today. States she always begins to vomit when uc's come. Has been taking Procardia xl 30mg  daily as directed. Denies vb or lof.  Nothing in vagina since Friday @ 1900.  Was seen in MAU 12/1 for uc's and vomiting. Cx was cl/th/high, she was given IVF, zofran, procardia, and was given 1st dose bmz 12/2 & received 2nd dose on 12/3.  History OB History   Grav Para Term Preterm Abortions TAB SAB Ect Mult Living   4 3 3       3      Past Medical History  Diagnosis Date  . Pregnant   . Kidney stones    Past Surgical History  Procedure Laterality Date  . Kidney stents    . Lithotripsy    . Appendectomy     Family History: family history includes Cancer in her mother; Diabetes in her maternal grandmother. Social History:  reports that she has never smoked. She has never used smokeless tobacco. She reports that she does not drink alcohol or use illicit drugs.   ROS Pertinent pos/neg as noted in HPI  Dilation: 1 Station: -3 Exam by:: Joellyn Haff CNM Blood pressure 139/84, pulse 102, temperature 98.2 F (36.8 C), temperature source Oral, resp. rate 20, height 5\' 3"  (1.6 m), weight 81.647 kg (180 lb), last menstrual period 04/10/2013. Maternal Exam:  Uterine Assessment: Contraction strength is mild.  Contraction frequency is regular.   Introitus: Normal vulva. Normal vagina.  Cervix: Cervix evaluated by sterile speculum exam and digital exam.     Fetal Exam Fetal Monitor Review: Mode: ultrasound.   Baseline rate: 150.  Variability: moderate (6-25 bpm).   Pattern: accelerations present.    Fetal State Assessment: Category I - tracings are normal.     Physical Exam  Constitutional: She is oriented to person, place, and time. She appears well-developed and well-nourished.  HENT:  Head: Normocephalic.  Neck:  Normal range of motion.  Cardiovascular: Normal rate and regular rhythm.   Respiratory: Effort normal and breath sounds normal.  GI: Soft. There is no tenderness.  gravid  Genitourinary:  Spec exam: scant amt thin white nonodorous d/c, cx visually closed  SVE: 1/th/high, posterior, unable to reach presenting part  Musculoskeletal: Normal range of motion.  Neurological: She is alert and oriented to person, place, and time.  Skin: Skin is warm and dry.  Psychiatric: She has a normal mood and affect. Her behavior is normal. Judgment and thought content normal.    Prenatal labs: ABO, Rh: --/--/O POS, O POS (09/24 1600) Antibody: NEG (09/24 1600) Rubella: 1.84 (06/16 1203) RPR: NON REAC (06/16 1203)  HBsAg: NEGATIVE (06/16 1203)  HIV: NON REACTIVE (06/16 1203)  GBS:   unknown    MAU Course:  EFM UA Spec exam w/ fFN, wet prep, gbs, gc/ch SVE LR bolus Procardia 10mg  po x 3 Pt developed n/v and reflux, and was also given Protonix 40mg  IV and Zofran 4mg  IV  0730: UCs, n/v, reflux have resolved, pt states she's feeling much better, sleeping. Declines repeat SVE.   Results for orders placed during the hospital encounter of 11/28/13 (from the past 24 hour(s))  URINALYSIS, ROUTINE W REFLEX MICROSCOPIC     Status: Abnormal   Collection Time    11/28/13  3:55 AM      Result Value Range  Color, Urine YELLOW  YELLOW   APPearance CLEAR  CLEAR   Specific Gravity, Urine <1.005 (*) 1.005 - 1.030   pH 6.0  5.0 - 8.0   Glucose, UA NEGATIVE  NEGATIVE mg/dL   Hgb urine dipstick NEGATIVE  NEGATIVE   Bilirubin Urine NEGATIVE  NEGATIVE   Ketones, ur NEGATIVE  NEGATIVE mg/dL   Protein, ur NEGATIVE  NEGATIVE mg/dL   Urobilinogen, UA 0.2  0.0 - 1.0 mg/dL   Nitrite NEGATIVE  NEGATIVE   Leukocytes, UA NEGATIVE  NEGATIVE  WET PREP, GENITAL     Status: None   Collection Time    11/28/13  4:32 AM      Result Value Range   Yeast Wet Prep HPF POC NONE SEEN  NONE SEEN   Trich, Wet Prep NONE SEEN   NONE SEEN   Clue Cells Wet Prep HPF POC NONE SEEN  NONE SEEN   WBC, Wet Prep HPF POC NONE SEEN  NONE SEEN  FETAL FIBRONECTIN     Status: Abnormal   Collection Time    11/28/13  4:32 AM      Result Value Range   Fetal Fibronectin POSITIVE (*) NEGATIVE     Assessment/Plan: A:   [redacted]w[redacted]d [facility]  G4P3003   Preterm uc's w/ minimal cervical change since 12/2  +fFN, received BMZ 12/2 & 12/3  N/V  Reflux   P:   Discussed w/ [facility], OK to d/c home  Increase po fluids to stay well hydrated, empty bladder frequently, lie on sides  Increase procardia to 30xl BID  Continue prilosec for GERD, phenergan prn n/v  Has appt on 12/18 at [facility], instructed to call to see if she can f/u sooner  Reviewed ptl s/s, fkc, reasons to return  Marge Duncans 11/28/2013, 4:36 AM

## 2013-11-28 NOTE — MAU Note (Signed)
Contractions and vaginal pressure since Friday night. When standing she can feel a "ball" on the left of the vagina that goes away after sitting.

## 2013-11-29 ENCOUNTER — Encounter: Payer: Self-pay | Admitting: Women's Health

## 2013-11-29 ENCOUNTER — Other Ambulatory Visit: Payer: Medicaid Other

## 2013-11-29 DIAGNOSIS — O479 False labor, unspecified: Secondary | ICD-10-CM | POA: Insufficient documentation

## 2013-11-29 LAB — GC/CHLAMYDIA PROBE AMP: CT Probe RNA: NEGATIVE

## 2013-12-01 LAB — CULTURE, BETA STREP (GROUP B ONLY): Special Requests: NORMAL

## 2013-12-02 ENCOUNTER — Encounter: Payer: Self-pay | Admitting: Advanced Practice Midwife

## 2013-12-02 ENCOUNTER — Encounter (INDEPENDENT_AMBULATORY_CARE_PROVIDER_SITE_OTHER): Payer: Self-pay

## 2013-12-02 ENCOUNTER — Ambulatory Visit (INDEPENDENT_AMBULATORY_CARE_PROVIDER_SITE_OTHER): Payer: Medicaid Other | Admitting: Advanced Practice Midwife

## 2013-12-02 VITALS — BP 124/84 | Wt 184.0 lb

## 2013-12-02 DIAGNOSIS — O368131 Decreased fetal movements, third trimester, fetus 1: Secondary | ICD-10-CM

## 2013-12-02 DIAGNOSIS — O99019 Anemia complicating pregnancy, unspecified trimester: Secondary | ICD-10-CM

## 2013-12-02 DIAGNOSIS — O368190 Decreased fetal movements, unspecified trimester, not applicable or unspecified: Secondary | ICD-10-CM

## 2013-12-02 DIAGNOSIS — Z1389 Encounter for screening for other disorder: Secondary | ICD-10-CM

## 2013-12-02 DIAGNOSIS — Z331 Pregnant state, incidental: Secondary | ICD-10-CM

## 2013-12-02 LAB — POCT URINALYSIS DIPSTICK
Ketones, UA: NEGATIVE
Protein, UA: NEGATIVE

## 2013-12-02 NOTE — Progress Notes (Signed)
States began having more frequent contractions last night. +fFN 12/7.  Still feels a "ball" on L side of vagina when using the bathroom.  In area of Bartholin gland, but is not a cyst.  ? Pelvic pressure.  EFM shows (? Square) contractions q 6 minutes. Contractions not palpable.  FHR reassuring and active on monitor.  Cx 1 at inner os/long/no pp (no change).  Is taking Procardia 30XL BID.  May increase am dose to 60mg  for now.

## 2013-12-03 ENCOUNTER — Encounter: Payer: Self-pay | Admitting: Women's Health

## 2013-12-06 ENCOUNTER — Telehealth: Payer: Self-pay | Admitting: Advanced Practice Midwife

## 2013-12-06 DIAGNOSIS — O2341 Unspecified infection of urinary tract in pregnancy, first trimester: Secondary | ICD-10-CM

## 2013-12-06 DIAGNOSIS — Z3483 Encounter for supervision of other normal pregnancy, third trimester: Secondary | ICD-10-CM

## 2013-12-06 DIAGNOSIS — O4703 False labor before 37 completed weeks of gestation, third trimester: Secondary | ICD-10-CM

## 2013-12-06 NOTE — Telephone Encounter (Signed)
Pt stated that she saw Drenda Freeze Thursday and medication was doubled. Pt stated that she is taking the medication like she was told to. Pt states that she is still having contractions and may be leaking fluid, lots of pain and pressure. Pt states that her contractions are 5- 10 minutes apart. Pt advised to go to [facility] for evaluation.

## 2013-12-08 ENCOUNTER — Encounter (INDEPENDENT_AMBULATORY_CARE_PROVIDER_SITE_OTHER): Payer: Self-pay

## 2013-12-08 ENCOUNTER — Encounter: Payer: Self-pay | Admitting: Advanced Practice Midwife

## 2013-12-08 ENCOUNTER — Inpatient Hospital Stay (HOSPITAL_COMMUNITY)
Admission: AD | Admit: 2013-12-08 | Discharge: 2013-12-08 | Disposition: A | Discharge status: Home / Self Care | Payer: Medicaid Other | Source: Ambulatory Visit | Attending: Obstetrics and Gynecology | Admitting: Obstetrics and Gynecology

## 2013-12-08 ENCOUNTER — Ambulatory Visit (INDEPENDENT_AMBULATORY_CARE_PROVIDER_SITE_OTHER): Payer: Medicaid Other | Admitting: Advanced Practice Midwife

## 2013-12-08 VITALS — BP 148/96 | Wt 181.0 lb

## 2013-12-08 DIAGNOSIS — O2390 Unspecified genitourinary tract infection in pregnancy, unspecified trimester: Secondary | ICD-10-CM

## 2013-12-08 DIAGNOSIS — K5289 Other specified noninfective gastroenteritis and colitis: Secondary | ICD-10-CM | POA: Insufficient documentation

## 2013-12-08 DIAGNOSIS — Z1389 Encounter for screening for other disorder: Secondary | ICD-10-CM

## 2013-12-08 DIAGNOSIS — Z331 Pregnant state, incidental: Secondary | ICD-10-CM

## 2013-12-08 DIAGNOSIS — O139 Gestational [pregnancy-induced] hypertension without significant proteinuria, unspecified trimester: Secondary | ICD-10-CM | POA: Insufficient documentation

## 2013-12-08 DIAGNOSIS — R51 Headache: Secondary | ICD-10-CM | POA: Insufficient documentation

## 2013-12-08 DIAGNOSIS — O99019 Anemia complicating pregnancy, unspecified trimester: Secondary | ICD-10-CM

## 2013-12-08 DIAGNOSIS — O212 Late vomiting of pregnancy: Secondary | ICD-10-CM | POA: Insufficient documentation

## 2013-12-08 DIAGNOSIS — E876 Hypokalemia: Secondary | ICD-10-CM | POA: Insufficient documentation

## 2013-12-08 DIAGNOSIS — E860 Dehydration: Secondary | ICD-10-CM | POA: Insufficient documentation

## 2013-12-08 LAB — COMPREHENSIVE METABOLIC PANEL
Albumin: 2.5 g/dL — ABNORMAL LOW (ref 3.5–5.2)
BUN: 5 mg/dL — ABNORMAL LOW (ref 6–23)
CO2: 24 mEq/L (ref 19–32)
Calcium: 10.3 mg/dL (ref 8.4–10.5)
Creatinine, Ser: 0.81 mg/dL (ref 0.50–1.10)
GFR calc Af Amer: >90 mL/min (ref >90)
Glucose, Bld: 127 mg/dL — ABNORMAL HIGH (ref 70–99)
Sodium: 135 mEq/L (ref 135–145)
Total Bilirubin: 0.2 mg/dL — ABNORMAL LOW (ref 0.3–1.2)
Total Protein: 6.8 g/dL (ref 6.0–8.3)

## 2013-12-08 LAB — PROTEIN / CREATININE RATIO, URINE
Creatinine, Urine: 41.19 mg/dL
Total Protein, Urine: 6.8 mg/dL

## 2013-12-08 LAB — CBC
HCT: 31.3 % — ABNORMAL LOW (ref 36.0–46.0)
MCH: 26.5 pg (ref 26.0–34.0)
MCHC: 32.9 g/dL (ref 30.0–36.0)
MCV: 80.5 fL (ref 78.0–100.0)
RBC: 3.89 MIL/uL (ref 3.87–5.11)
RDW: 15.2 % (ref 11.5–15.5)

## 2013-12-08 LAB — POCT URINALYSIS DIPSTICK
Blood, UA: NEGATIVE
Glucose, UA: NEGATIVE
Ketones, UA: NEGATIVE
Nitrite, UA: NEGATIVE
Protein, UA: NEGATIVE

## 2013-12-08 MED ORDER — POTASSIUM CHLORIDE 2 MEQ/ML IV SOLN
INTRAVENOUS | Status: DC
  Administered 2013-12-08: 20:00:00 via INTRAVENOUS
  Filled 2013-12-08: qty 1000

## 2013-12-08 MED ORDER — ONDANSETRON 4 MG PO TBDP
4.0000 mg | ORAL_TABLET | Freq: Once | ORAL | Status: AC
  Administered 2013-12-08: 4 mg via ORAL
  Filled 2013-12-08: qty 1

## 2013-12-08 MED ORDER — POTASSIUM CHLORIDE CRYS ER 20 MEQ PO TBCR
20.0000 meq | EXTENDED_RELEASE_TABLET | Freq: Once | ORAL | Status: AC
  Administered 2013-12-08: 20 meq via ORAL
  Filled 2013-12-08: qty 1

## 2013-12-08 MED ORDER — POTASSIUM CHLORIDE CRYS ER 10 MEQ PO TBCR: 10.0000 meq | EXTENDED_RELEASE_TABLET | Freq: Two times a day (BID) | ORAL | Status: DC

## 2013-12-08 MED ORDER — FAMOTIDINE 20 MG PO TABS
20.0000 mg | ORAL_TABLET | Freq: Once | ORAL | Status: AC
  Administered 2013-12-08: 20 mg via ORAL

## 2013-12-08 MED ORDER — FAMOTIDINE 20 MG PO TABS
10.0000 mg | ORAL_TABLET | Freq: Once | ORAL | Status: DC
  Filled 2013-12-08: qty 1

## 2013-12-08 NOTE — MAU Note (Signed)
Pt states her BP was elevated in  The office. Pt denies headache blurred vision or headache but pt is nauseous and vomiting.

## 2013-12-08 NOTE — Progress Notes (Signed)
See note from [place].  SSE:  + thin white d/c, vagina red.  + BV on wet prep  CX: outer os 3-4/ inner os loose 1cm.  On Procardia 60XL BID for PTcontractions/+ffn.  Was nauseated this am, but did keep her medicine down.  Dr. Ned Card notified that she will be coming to MAU for evaluation of increased b/p.  Will need treatment for BV if admitted.

## 2013-12-08 NOTE — MAU Provider Note (Addendum)
History    CSN: [id number] Arrival date and time: 12/08/13 1720 Chief Complaint  Patient presents with  . Hypertension  . Labor Eval   HPI [name] is a 32 y.o. G4P3003 at [redacted]w[redacted]d sent from clinic for high BP.   She has been nauseated and had emesis x6 today, and has not been able to keep down fluids since last night. Also feeling intermittent abdominal pressure that has been present for weeks. She presented to [facility] for this and was found to have some high blood pressures. She was sent to the MAU for further evaluation. Denies fever, diarrhea/constipation. Reports a headache that began behind her eyes and was associated with blurry vision yesterday. Denies scotomata, RUQ/epigastric pain. Reports good fetal movement. No LOF, VB. Reports mucus plug while on toilet earlier today.   Pt gets PNC at [facility]. Hospitalized in September for preterm contractions and treated for UTI at that time. She has received steroids on 12/1, 12/2 and had a positive fFN 12/7. Cervix 1cm in office today. On procardiaXL 60mg  BID.   Past Medical History  Diagnosis Date  . Pregnant   . Kidney stones    Past Surgical History  Procedure Laterality Date  . Kidney stents    . Lithotripsy    . Appendectomy     Family History  Problem Relation Age of Onset  . Cancer Mother     breast  . Diabetes Maternal Grandmother    History  Substance Use Topics  . Smoking status: Never Smoker   . Smokeless tobacco: Never Used  . Alcohol Use: No   Allergies: No Known Allergies  Prescriptions prior to admission  Medication Sig Dispense Refill  . acetaminophen (TYLENOL) 325 MG tablet Take 650 mg by mouth every 6 (six) hours as needed for pain or fever.      . calcium carbonate (TUMS - DOSED IN MG ELEMENTAL CALCIUM) 500 MG chewable tablet Chew 2 tablets by mouth See admin instructions. Patient takes 2-3 chewables per hour.      . cyclobenzaprine (FLEXERIL) 10 MG tablet Take 10 mg by mouth  3 (three) times daily as needed for muscle spasms.      Marland Kitchen NIFEdipine (PROCARDIA-XL/ADALAT CC) 30 MG 24 hr tablet Take 1 tablet (30 mg total) by mouth 2 (two) times daily.  60 tablet  1  . omeprazole (PRILOSEC) 20 MG capsule Take 1 capsule (20 mg total) by mouth daily.  30 capsule  6  . Prenatal Vit-Fe Fumarate-FA (PRENATAL MULTIVITAMIN) TABS tablet Take 1 tablet by mouth daily at 12 noon.      . promethazine (PHENERGAN) 50 MG tablet Take one half OR one whole tablet every 6 hours as needed for nausea or vomiting.  30 tablet  1   ROS As per HPI, otherwise negative.  Physical Exam   Blood pressure 146/86, pulse 109, temperature 98.6 F (37 C), temperature source Oral, resp. rate 20, height 5\' 2"  (1.575 m), weight 82.736 kg (182 lb 6.4 oz), last menstrual period 04/10/2013, SpO2 100.00%.  Physical Exam  Constitutional: She is oriented to person, place, and time. She appears well-developed and well-nourished. No distress.  HENT:  Head: Normocephalic and atraumatic.  Eyes: Conjunctivae and EOM are normal. No scleral icterus.  Neck: Normal range of motion. Neck supple.  Cardiovascular: Normal heart sounds and intact distal pulses.  Exam reveals no gallop.   No murmur heard. Respiratory: Effort normal and breath sounds normal. No respiratory distress. She has no rales.  GI: Soft. Bowel sounds are normal. There is no tenderness.  Musculoskeletal: Normal range of motion. She exhibits no edema.  Neurological: She is alert and oriented to person, place, and time. She has normal reflexes.   FHT:  Baseline 145, moderate variability, accelerations present, no decelerations Toco:  Irregular waveforms with rapid upslope and gradual descent not thought to represent true contractions   12/08/2013 17:56  Sodium 135  Potassium 2.7 (LL)  Chloride 94 (L)  CO2 24  BUN 5 (L)  Creatinine 0.81  Calcium 10.3  GFR calc non Af Amer >90  GFR calc Af Amer >90  Glucose 127 (H)  Alkaline Phosphatase 99   Albumin 2.5 (L)  AST 19  ALT 15  Total Protein 6.8  Total Bilirubin 0.2 (L)  WBC 12.0 (H)  RBC 3.89  Hemoglobin 10.3 (L)  HCT 31.3 (L)  MCV 80.5  MCH 26.5  MCHC 32.9  RDW 15.2  Platelets 207   MAU Course  Procedures  Zofran and pepcid given for N/V  1L bolus of LR w/40KCl and K-DUR given for rehydration and potassium repletion Blood pressures remain elevated (150/90 > 148/96 > 146/86). U/A without evidence of proteinuria UPr:Cr pending.  Cervical exam deferred as she was checked previously today and is not having true contractions.  Assessment and Plan  Micah L Vance Johnston is a 32 y.o. G4P3003 at [redacted]w[redacted]d with HTN and hypokalemia   A: - Gastroenteritis - Hypokalemia - Dehydration - Gestational HTN  P:  - Has appointment at [facility] tomorrow 8:45am.  - Continue procardia  Care assumed by Philipp Deputy, CNM and Drexel Iha, MD at 20:00  Hazeline Junker 12/08/2013, 7:44 PM   21:00  [email or web address] Patient labs have  Returned with PR/Cr ratio 0.17, and Lft normal, plts 207K,  And pt denies h/a, no vision c/o now,  BP 138/80's. Fhr Cat I. Pt has received 5oo cc of IVfluids with 40 mEq of K in fluids, and tolerating p.o K+, so will d/c home , add p.o. K+ and have pt keep scheduled followup u/s in am. (fetal kidney). So will d/c on p.o. K supplement, and have pt followup a t [facility].

## 2013-12-08 NOTE — MAU Note (Signed)
Patient states she is having contractions every 5-10 minutes. Was seen at [facility] and her blood pressure was elevated and sent to MAU for further evaluation. Reports good fetal movement, no bleeding or leaking. Feels a "little woozy and short of breath".

## 2013-12-09 ENCOUNTER — Encounter (INDEPENDENT_AMBULATORY_CARE_PROVIDER_SITE_OTHER): Payer: Self-pay

## 2013-12-09 ENCOUNTER — Encounter: Payer: Self-pay | Admitting: Advanced Practice Midwife

## 2013-12-09 ENCOUNTER — Ambulatory Visit (INDEPENDENT_AMBULATORY_CARE_PROVIDER_SITE_OTHER): Payer: Medicaid Other | Admitting: Advanced Practice Midwife

## 2013-12-09 VITALS — BP 130/82 | Wt 184.0 lb

## 2013-12-09 DIAGNOSIS — O99019 Anemia complicating pregnancy, unspecified trimester: Secondary | ICD-10-CM

## 2013-12-09 DIAGNOSIS — O133 Gestational [pregnancy-induced] hypertension without significant proteinuria, third trimester: Secondary | ICD-10-CM

## 2013-12-09 DIAGNOSIS — Z1389 Encounter for screening for other disorder: Secondary | ICD-10-CM

## 2013-12-09 DIAGNOSIS — O2390 Unspecified genitourinary tract infection in pregnancy, unspecified trimester: Secondary | ICD-10-CM

## 2013-12-09 DIAGNOSIS — Z331 Pregnant state, incidental: Secondary | ICD-10-CM

## 2013-12-09 DIAGNOSIS — O139 Gestational [pregnancy-induced] hypertension without significant proteinuria, unspecified trimester: Secondary | ICD-10-CM

## 2013-12-09 LAB — POCT URINALYSIS DIPSTICK
Glucose, UA: NEGATIVE
Nitrite, UA: NEGATIVE
Protein, UA: NEGATIVE

## 2013-12-09 MED ORDER — METRONIDAZOLE 500 MG PO TABS: 500.0000 mg | ORAL_TABLET | Freq: Two times a day (BID) | ORAL | Status: DC

## 2013-12-13 ENCOUNTER — Encounter: Payer: Self-pay | Admitting: Obstetrics & Gynecology

## 2013-12-13 ENCOUNTER — Ambulatory Visit (INDEPENDENT_AMBULATORY_CARE_PROVIDER_SITE_OTHER): Payer: Medicaid Other | Admitting: Obstetrics & Gynecology

## 2013-12-13 ENCOUNTER — Telehealth: Payer: Self-pay | Admitting: Obstetrics & Gynecology

## 2013-12-13 ENCOUNTER — Inpatient Hospital Stay (HOSPITAL_COMMUNITY)
Admission: AD | Admit: 2013-12-13 | Discharge: 2013-12-13 | Disposition: A | Discharge status: Home / Self Care | Payer: Medicaid Other | Source: Ambulatory Visit | Attending: Obstetrics & Gynecology | Admitting: Obstetrics & Gynecology

## 2013-12-13 VITALS — BP 150/100 | Wt 184.0 lb

## 2013-12-13 DIAGNOSIS — O0993 Supervision of high risk pregnancy, unspecified, third trimester: Secondary | ICD-10-CM

## 2013-12-13 DIAGNOSIS — O139 Gestational [pregnancy-induced] hypertension without significant proteinuria, unspecified trimester: Secondary | ICD-10-CM | POA: Insufficient documentation

## 2013-12-13 DIAGNOSIS — R12 Heartburn: Secondary | ICD-10-CM | POA: Insufficient documentation

## 2013-12-13 DIAGNOSIS — O133 Gestational [pregnancy-induced] hypertension without significant proteinuria, third trimester: Secondary | ICD-10-CM

## 2013-12-13 DIAGNOSIS — Z331 Pregnant state, incidental: Secondary | ICD-10-CM

## 2013-12-13 DIAGNOSIS — R51 Headache: Secondary | ICD-10-CM | POA: Insufficient documentation

## 2013-12-13 DIAGNOSIS — Z1389 Encounter for screening for other disorder: Secondary | ICD-10-CM

## 2013-12-13 DIAGNOSIS — O2341 Unspecified infection of urinary tract in pregnancy, first trimester: Secondary | ICD-10-CM

## 2013-12-13 DIAGNOSIS — O99019 Anemia complicating pregnancy, unspecified trimester: Secondary | ICD-10-CM

## 2013-12-13 LAB — POCT URINALYSIS DIPSTICK
Blood, UA: NEGATIVE
Glucose, UA: NEGATIVE
Leukocytes, UA: NEGATIVE
Nitrite, UA: NEGATIVE
Protein, UA: NEGATIVE

## 2013-12-13 LAB — CBC
Platelets: 220 10*3/uL (ref 150–400)
RBC: 3.9 MIL/uL (ref 3.87–5.11)
RDW: 15.7 % — ABNORMAL HIGH (ref 11.5–15.5)
WBC: 9.1 10*3/uL (ref 4.0–10.5)

## 2013-12-13 LAB — COMPREHENSIVE METABOLIC PANEL
ALT: 23 U/L (ref 0–35)
AST: 31 U/L (ref 0–37)
Albumin: 2.6 g/dL — ABNORMAL LOW (ref 3.5–5.2)
Alkaline Phosphatase: 99 U/L (ref 39–117)
Chloride: 97 mEq/L (ref 96–112)
Creatinine, Ser: 0.74 mg/dL (ref 0.50–1.10)
Potassium: 2.7 mEq/L — CL (ref 3.5–5.1)
Sodium: 135 mEq/L (ref 135–145)
Total Bilirubin: 0.2 mg/dL — ABNORMAL LOW (ref 0.3–1.2)
Total Protein: 6.9 g/dL (ref 6.0–8.3)

## 2013-12-13 LAB — PROTEIN / CREATININE RATIO, URINE
Protein Creatinine Ratio: 0.15 (ref 0.00–0.15)
Total Protein, Urine: 6 mg/dL

## 2013-12-13 MED ORDER — CALCIUM CARBONATE ANTACID 500 MG PO CHEW
400.0000 mg | CHEWABLE_TABLET | Freq: Once | ORAL | Status: DC
  Filled 2013-12-13: qty 2

## 2013-12-13 MED ORDER — ZOLPIDEM TARTRATE 5 MG PO TABS: 5.0000 mg | ORAL_TABLET | Freq: Every evening | ORAL | Status: DC | PRN

## 2013-12-13 MED ORDER — BUTALBITAL-APAP-CAFFEINE 50-325-40 MG PO TABS
2.0000 | ORAL_TABLET | Freq: Once | ORAL | Status: AC
  Administered 2013-12-13: 2 via ORAL
  Filled 2013-12-13: qty 2

## 2013-12-13 MED ORDER — BUTALBITAL-APAP-CAFFEINE 50-325-40 MG PO TABS: 1.0000 | ORAL_TABLET | Freq: Four times a day (QID) | ORAL | Status: DC | PRN

## 2013-12-13 NOTE — MAU Note (Signed)
Patient was sent from [facility] for evaluation of elevated blood pressure. Patient states she has had "sparks raining down" in vision, has had nausea and vomiting since yesterday. Denies bleeding or leaking, has some mild contractions. Good fetal movement.

## 2013-12-13 NOTE — MAU Provider Note (Signed)
History     CSN: [id number]  Arrival date and time: 12/13/13 1217   First Provider Initiated Contact with Patient 12/13/13 1408      Chief Complaint  Patient presents with  . Hypertension  . Headache  . Emesis   HPI This is a 32 y.o. female at [redacted]w[redacted]d who was sent by Dr Despina Hidden for [facility] labs.  Has been having some headaches and seeing stars for several days. Has daily heartburn, relieved by TUMS. No swelling. Denies abdominal pain.   DR Despina Hidden Note:  Pt with occipital headache 3-4 times per day, since Friday with nausea and vomiting associated at times, has "falling sparks" visual disturbances with the headache as well. Pt denies RUQ pain  Takes tylenol with some improvement  Reactive NST  BP weight and urine results all reviewed and noted.  Patient reports good fetal movement, denies any bleeding and no rupture of membranes symptoms or regular contractions. Patient is without other complaints.  All questions were answered.  Pt to go to MAU for observationa dnevalution       OB History   Grav Para Term Preterm Abortions TAB SAB Ect Mult Living   4 3 3       3       Past Medical History  Diagnosis Date  . Pregnant   . Kidney stones     Past Surgical History  Procedure Laterality Date  . Kidney stents    . Lithotripsy    . Appendectomy      Family History  Problem Relation Age of Onset  . Cancer Mother     breast  . Diabetes Maternal Grandmother     History  Substance Use Topics  . Smoking status: Never Smoker   . Smokeless tobacco: Never Used  . Alcohol Use: No    Allergies: No Known Allergies  Prescriptions prior to admission  Medication Sig Dispense Refill  . acetaminophen (TYLENOL) 500 MG tablet Take 1,000 mg by mouth every 6 (six) hours as needed for moderate pain.      . calcium carbonate (TUMS - DOSED IN MG ELEMENTAL CALCIUM) 500 MG chewable tablet Chew 2 tablets by mouth See admin instructions. Patient takes 2-3 chewables very 10-15 minutes.      Marland Kitchen  NIFEdipine (PROCARDIA-XL/ADALAT-CC/NIFEDICAL-XL) 30 MG 24 hr tablet Take 60 mg by mouth 2 (two) times daily.      Marland Kitchen omeprazole (PRILOSEC) 20 MG capsule Take 1 capsule (20 mg total) by mouth daily.  30 capsule  6  . potassium chloride (K-DUR,KLOR-CON) 10 MEQ tablet Take 10 mEq by mouth 2 (two) times daily.      . Prenatal Vit-Fe Fumarate-FA (PRENATAL MULTIVITAMIN) TABS tablet Take 1 tablet by mouth daily at 12 noon.      . promethazine (PHENERGAN) 50 MG tablet Take one half OR one whole tablet every 6 hours as needed for nausea or vomiting.  30 tablet  1  . [DISCONTINUED] potassium chloride (K-DUR,KLOR-CON) 10 MEQ tablet Take 1 tablet (10 mEq total) by mouth 2 (two) times daily.  60 tablet  0    Review of Systems  Constitutional: Negative for fever, chills and malaise/fatigue.  Eyes: Negative for blurred vision.  Cardiovascular: Negative for chest pain.  Gastrointestinal: Positive for heartburn. Negative for nausea, vomiting, abdominal pain, diarrhea and constipation.  Genitourinary: Negative for dysuria.  Neurological: Positive for headaches. Negative for dizziness, focal weakness and weakness.   Physical Exam   Blood pressure 139/91, pulse 104, temperature 98.4 F (36.9 C),  temperature source Oral, resp. rate 20, last menstrual period 04/10/2013, SpO2 100.00%. Filed Vitals:   12/13/13 1247 12/13/13 1302 12/13/13 1316 12/13/13 1331  BP: 132/92 136/79 144/80 139/91  Pulse: 111 113 105 104  Temp:      TempSrc:      Resp:      SpO2:        Physical Exam  Constitutional: She is oriented to person, place, and time. She appears well-developed and well-nourished. No distress.  HENT:  Head: Normocephalic.  Cardiovascular: Normal rate.   Respiratory: Effort normal.  GI: Soft. She exhibits no distension. There is no tenderness. There is no rebound and no guarding.  Musculoskeletal: Normal range of motion. She exhibits no edema.  Neurological: She is alert and oriented to person, place,  and time. She has normal reflexes. She displays normal reflexes. She exhibits normal muscle tone.  Skin: Skin is warm and dry.  Psychiatric: She has a normal mood and affect.    MAU Course  Procedures  MDM Results for orders placed during the hospital encounter of 12/13/13 (from the past 24 hour(s))  CBC     Status: Abnormal   Collection Time    12/13/13 12:32 PM      Result Value Range   WBC 9.1  4.0 - 10.5 K/uL   RBC 3.90  3.87 - 5.11 MIL/uL   Hemoglobin 10.3 (*) 12.0 - 15.0 g/dL   HCT 16.1 (*) 09.6 - 04.5 %   MCV 82.1  78.0 - 100.0 fL   MCH 26.4  26.0 - 34.0 pg   MCHC 32.2  30.0 - 36.0 g/dL   RDW 40.9 (*) 81.1 - 91.4 %   Platelets 220  150 - 400 K/uL  COMPREHENSIVE METABOLIC PANEL     Status: Abnormal   Collection Time    12/13/13 12:32 PM      Result Value Range   Sodium 135  135 - 145 mEq/L   Potassium 2.7 (*) 3.5 - 5.1 mEq/L   Chloride 97  96 - 112 mEq/L   CO2 23  19 - 32 mEq/L   Glucose, Bld 196 (*) 70 - 99 mg/dL   BUN 5 (*) 6 - 23 mg/dL   Creatinine, Ser 7.82  0.50 - 1.10 mg/dL   Calcium 9.6  8.4 - 95.6 mg/dL   Total Protein 6.9  6.0 - 8.3 g/dL   Albumin 2.6 (*) 3.5 - 5.2 g/dL   AST 31  0 - 37 U/L   ALT 23  0 - 35 U/L   Alkaline Phosphatase 99  39 - 117 U/L   Total Bilirubin 0.2 (*) 0.3 - 1.2 mg/dL   GFR calc non Af Amer >90  >90 mL/min   GFR calc Af Amer >90  >90 mL/min  PROTEIN / CREATININE RATIO, URINE     Status: None   Collection Time    12/13/13 12:40 PM      Result Value Range   Creatinine, Urine 39.64     Total Protein, Urine 6     PROTEIN CREATININE RATIO 0.15  0.00 - 0.15   Last Pr/Cr Ratio several days ago was 0.17.  Headache completely relieved by med  Assessment and Plan  A:  SIUP at [redacted]w[redacted]d       Mild hypertension      No evidence of preeclampsia      Headache relieved by Fioricet  P:  DIscussed with Dr Marice Potter      Discharge home  Rx Fioricet for home PRN use      PIH precautions      followup in  clinic  [facility] 12/13/2013, 2:09 PM

## 2013-12-13 NOTE — Progress Notes (Signed)
Pt with occipital headache 3-4 times per day, since Friday with nausea and vomiting associated at times, has "falling sparks" visual disturbances with the headache as well.  Pt denies RUQ pain Takes tylenol with some improvement Reactive NST BP weight and urine results all reviewed and noted. Patient reports good fetal movement, denies any bleeding and no rupture of membranes symptoms or regular contractions. Patient is without other complaints. All questions were answered. Pt to go to MAU for observationa dnevalution

## 2013-12-16 ENCOUNTER — Inpatient Hospital Stay (HOSPITAL_COMMUNITY)
Admission: AD | Admit: 2013-12-16 | Discharge: 2013-12-16 | Disposition: A | Discharge status: Home / Self Care | Payer: Medicaid Other | Source: Ambulatory Visit | Attending: Obstetrics & Gynecology | Admitting: Obstetrics & Gynecology

## 2013-12-16 ENCOUNTER — Encounter (HOSPITAL_COMMUNITY): Payer: Self-pay | Admitting: *Deleted

## 2013-12-16 DIAGNOSIS — O133 Gestational [pregnancy-induced] hypertension without significant proteinuria, third trimester: Secondary | ICD-10-CM

## 2013-12-16 DIAGNOSIS — O212 Late vomiting of pregnancy: Secondary | ICD-10-CM | POA: Insufficient documentation

## 2013-12-16 DIAGNOSIS — O4700 False labor before 37 completed weeks of gestation, unspecified trimester: Secondary | ICD-10-CM | POA: Insufficient documentation

## 2013-12-16 DIAGNOSIS — O139 Gestational [pregnancy-induced] hypertension without significant proteinuria, unspecified trimester: Secondary | ICD-10-CM | POA: Insufficient documentation

## 2013-12-16 LAB — CBC
HCT: 30.3 % — ABNORMAL LOW (ref 36.0–46.0)
Hemoglobin: 10 g/dL — ABNORMAL LOW (ref 12.0–15.0)
MCHC: 33 g/dL (ref 30.0–36.0)
RBC: 3.76 MIL/uL — ABNORMAL LOW (ref 3.87–5.11)

## 2013-12-16 LAB — COMPREHENSIVE METABOLIC PANEL
ALT: 32 U/L (ref 0–35)
AST: 44 U/L — ABNORMAL HIGH (ref 0–37)
Albumin: 2.5 g/dL — ABNORMAL LOW (ref 3.5–5.2)
Alkaline Phosphatase: 92 U/L (ref 39–117)
CO2: 22 mEq/L (ref 19–32)
Calcium: 9.3 mg/dL (ref 8.4–10.5)
Chloride: 100 mEq/L (ref 96–112)
Creatinine, Ser: 0.75 mg/dL (ref 0.50–1.10)
Potassium: 3.2 mEq/L — ABNORMAL LOW (ref 3.5–5.1)
Sodium: 135 mEq/L (ref 135–145)
Total Bilirubin: <0.1 mg/dL — ABNORMAL LOW (ref 0.3–1.2)
Total Protein: 6.4 g/dL (ref 6.0–8.3)

## 2013-12-16 LAB — URINE MICROSCOPIC-ADD ON

## 2013-12-16 LAB — URINALYSIS, ROUTINE W REFLEX MICROSCOPIC
Glucose, UA: NEGATIVE mg/dL
Hgb urine dipstick: NEGATIVE
Ketones, ur: NEGATIVE mg/dL
Protein, ur: NEGATIVE mg/dL
Urobilinogen, UA: 0.2 mg/dL (ref 0.0–1.0)
pH: 7 (ref 5.0–8.0)

## 2013-12-16 LAB — PROTEIN / CREATININE RATIO, URINE
Creatinine, Urine: 47.37 mg/dL
Protein Creatinine Ratio: 0.16 — ABNORMAL HIGH (ref 0.00–0.15)
Total Protein, Urine: 7.8 mg/dL

## 2013-12-16 NOTE — MAU Note (Signed)
Pt states she is her for evaluation for elevated BP

## 2013-12-16 NOTE — MAU Provider Note (Signed)
History     CSN: [id number]  Arrival date and time: 12/16/13 1530   None     Chief Complaint  Patient presents with  . Hypertension   HPI  Pt is a 32 yo G4P3003 at [redacted]w[redacted]d weeks IUP here for NST due to gestational hypertension.  Reports headache and spots that worsened last night. Also states had increased nausea and vomiting that started yesterday.  Reports vomiting approximately 6x last night and three times today.  No report of fever, body aches, or chills.  Onset began approximately a week ago.  + irregular contractions, no leaking of fluid or vaginal bleeding.  +right groin pain with movement.    Past Medical History  Diagnosis Date  . Pregnant   . Kidney stones     Past Surgical History  Procedure Laterality Date  . Kidney stents    . Lithotripsy    . Appendectomy      Family History  Problem Relation Age of Onset  . Cancer Mother     breast  . Diabetes Maternal Grandmother     History  Substance Use Topics  . Smoking status: Never Smoker   . Smokeless tobacco: Never Used  . Alcohol Use: No    Allergies: No Known Allergies  Prescriptions prior to admission  Medication Sig Dispense Refill  . acetaminophen (TYLENOL) 500 MG tablet Take 1,000 mg by mouth every 6 (six) hours as needed for mild pain or moderate pain.       . butalbital-acetaminophen-caffeine (FIORICET) 50-325-40 MG per tablet Take 1-2 tablets by mouth every 6 (six) hours as needed for headache.  20 tablet  0  . calcium carbonate (TUMS - DOSED IN MG ELEMENTAL CALCIUM) 500 MG chewable tablet Chew 2 tablets by mouth See admin instructions. Patient takes 2-3 chewables very 10-15 minutes.      Marland Kitchen NIFEdipine (PROCARDIA-XL/ADALAT-CC/NIFEDICAL-XL) 30 MG 24 hr tablet Take 60 mg by mouth 2 (two) times daily.      Marland Kitchen omeprazole (PRILOSEC) 20 MG capsule Take 1 capsule (20 mg total) by mouth daily.  30 capsule  6  . potassium chloride (K-DUR,KLOR-CON) 10 MEQ tablet Take 10 mEq by mouth 2 (two) times daily.       . Prenatal Vit-Fe Fumarate-FA (PRENATAL MULTIVITAMIN) TABS tablet Take 1 tablet by mouth daily at 12 noon.      . promethazine (PHENERGAN) 50 MG tablet Take 50 mg by mouth every 6 (six) hours as needed for nausea or vomiting.      Marland Kitchen zolpidem (AMBIEN) 5 MG tablet Take 1 tablet (5 mg total) by mouth at bedtime as needed for sleep.  30 tablet  0    Review of Systems  Constitutional: Negative for fever and chills.  HENT: Hearing loss: last night.   Eyes: Positive for blurred vision.  Gastrointestinal: Positive for nausea, vomiting and abdominal pain (contractions, right groin pain).  Musculoskeletal: Negative for myalgias.  Neurological: Positive for dizziness and headaches.  All other systems reviewed and are negative.   Physical Exam   Blood pressure 134/83, pulse 403, temperature 98.7 F (37.1 C), temperature source Oral, resp. rate 16, last menstrual period 04/10/2013.  Physical Exam  Constitutional: She is oriented to person, place, and time. She appears well-developed and well-nourished. No distress.  HENT:  Head: Normocephalic.  Eyes: Pupils are equal, round, and reactive to light.  Neck: Normal range of motion. Neck supple.  Cardiovascular: Normal rate, regular rhythm and normal heart sounds.   Respiratory: Effort normal and breath  sounds normal. No respiratory distress.  GI: Soft. There is no tenderness.  Genitourinary: No bleeding around the vagina. Vaginal discharge (mucusy) found.  Musculoskeletal: Normal range of motion. She exhibits no edema.  Trace pedal edema  Neurological: She is alert and oriented to person, place, and time. She has normal reflexes. She displays normal reflexes.  Skin: Skin is warm and dry.   Dilation: Fingertip Effacement (%): Thick Cervical Position: Posterior Station: Ballotable Exam by:: Roney Marion, CNM  MAU Course  Procedures  Results for orders placed during the hospital encounter of 12/16/13 (from the past 24 hour(s))  URINALYSIS,  ROUTINE W REFLEX MICROSCOPIC     Status: Abnormal   Collection Time    12/16/13  4:33 PM      Result Value Range   Color, Urine YELLOW  YELLOW   APPearance CLEAR  CLEAR   Specific Gravity, Urine 1.015  1.005 - 1.030   pH 7.0  5.0 - 8.0   Glucose, UA NEGATIVE  NEGATIVE mg/dL   Hgb urine dipstick NEGATIVE  NEGATIVE   Bilirubin Urine NEGATIVE  NEGATIVE   Ketones, ur NEGATIVE  NEGATIVE mg/dL   Protein, ur NEGATIVE  NEGATIVE mg/dL   Urobilinogen, UA 0.2  0.0 - 1.0 mg/dL   Nitrite NEGATIVE  NEGATIVE   Leukocytes, UA TRACE (*) NEGATIVE  URINE MICROSCOPIC-ADD ON     Status: Abnormal   Collection Time    12/16/13  4:33 PM      Result Value Range   Squamous Epithelial / LPF FEW (*) RARE   WBC, UA 7-10  <3 WBC/hpf   RBC / HPF 3-6  <3 RBC/hpf   Bacteria, UA FEW (*) RARE  CBC     Status: Abnormal   Collection Time    12/16/13  4:40 PM      Result Value Range   WBC 8.7  4.0 - 10.5 K/uL   RBC 3.76 (*) 3.87 - 5.11 MIL/uL   Hemoglobin 10.0 (*) 12.0 - 15.0 g/dL   HCT 14.7 (*) 82.9 - 56.2 %   MCV 80.6  78.0 - 100.0 fL   MCH 26.6  26.0 - 34.0 pg   MCHC 33.0  30.0 - 36.0 g/dL   RDW 13.0 (*) 86.5 - 78.4 %   Platelets 229  150 - 400 K/uL  COMPREHENSIVE METABOLIC PANEL     Status: Abnormal   Collection Time    12/16/13  4:40 PM      Result Value Range   Sodium 135  135 - 145 mEq/L   Potassium 3.2 (*) 3.5 - 5.1 mEq/L   Chloride 100  96 - 112 mEq/L   CO2 22  19 - 32 mEq/L   Glucose, Bld 96  70 - 99 mg/dL   BUN 5 (*) 6 - 23 mg/dL   Creatinine, Ser 6.96  0.50 - 1.10 mg/dL   Calcium 9.3  8.4 - 29.5 mg/dL   Total Protein 6.4  6.0 - 8.3 g/dL   Albumin 2.5 (*) 3.5 - 5.2 g/dL   AST 44 (*) 0 - 37 U/L   ALT 32  0 - 35 U/L   Alkaline Phosphatase 92  39 - 117 U/L   Total Bilirubin <0.1 (*) 0.3 - 1.2 mg/dL   GFR calc non Af Amer >90  >90 mL/min   GFR calc Af Amer >90  >90 mL/min   1735 Consulted with Dr. Despina Hidden > reviewed HPI/exam/labs/contraction pattern > discharge home with follow-up in office  for sonogram on  Monday.  Assessment and Plan  32 yo G4P3003 at [redacted]w[redacted]d wks IUP Gestational Hypertension Category I FHR Tracing  Plan: Discharge to home PreX precautions Appointment in clinic on Monday  [facility] 12/16/2013, 4:20 PM

## 2013-12-16 NOTE — Progress Notes (Signed)
Pt states she threw up last night 

## 2013-12-17 LAB — URINE CULTURE: Culture: NO GROWTH

## 2013-12-20 ENCOUNTER — Other Ambulatory Visit: Payer: Self-pay | Admitting: Obstetrics & Gynecology

## 2013-12-20 ENCOUNTER — Other Ambulatory Visit (INDEPENDENT_AMBULATORY_CARE_PROVIDER_SITE_OTHER): Payer: Medicaid Other

## 2013-12-20 DIAGNOSIS — O133 Gestational [pregnancy-induced] hypertension without significant proteinuria, third trimester: Secondary | ICD-10-CM

## 2013-12-20 DIAGNOSIS — O289 Unspecified abnormal findings on antenatal screening of mother: Secondary | ICD-10-CM

## 2013-12-20 DIAGNOSIS — O139 Gestational [pregnancy-induced] hypertension without significant proteinuria, unspecified trimester: Secondary | ICD-10-CM

## 2013-12-20 NOTE — Progress Notes (Signed)
U/S(34+4wks)-vtx active fetus BPP 8/8, fluid wnl AFI_11.4cm, anterior Gr 2 placenta, UA Doppler RI-0.60 & 0.52, female fetus "[name]", EFW 6 lb (63rd%tile), FHR-155 bpm

## 2013-12-21 ENCOUNTER — Encounter (HOSPITAL_COMMUNITY): Payer: Self-pay

## 2013-12-21 ENCOUNTER — Ambulatory Visit (INDEPENDENT_AMBULATORY_CARE_PROVIDER_SITE_OTHER): Payer: Medicaid Other | Admitting: Obstetrics & Gynecology

## 2013-12-21 ENCOUNTER — Inpatient Hospital Stay (HOSPITAL_COMMUNITY)
Admission: AD | Admit: 2013-12-21 | Discharge: 2013-12-21 | Disposition: A | Discharge status: Home / Self Care | Payer: Medicaid Other | Source: Ambulatory Visit | Attending: Obstetrics & Gynecology | Admitting: Obstetrics & Gynecology

## 2013-12-21 ENCOUNTER — Encounter: Payer: Self-pay | Admitting: Obstetrics & Gynecology

## 2013-12-21 VITALS — BP 150/90 | Wt 188.5 lb

## 2013-12-21 DIAGNOSIS — O99019 Anemia complicating pregnancy, unspecified trimester: Secondary | ICD-10-CM

## 2013-12-21 DIAGNOSIS — O139 Gestational [pregnancy-induced] hypertension without significant proteinuria, unspecified trimester: Secondary | ICD-10-CM

## 2013-12-21 DIAGNOSIS — R109 Unspecified abdominal pain: Secondary | ICD-10-CM | POA: Insufficient documentation

## 2013-12-21 DIAGNOSIS — O133 Gestational [pregnancy-induced] hypertension without significant proteinuria, third trimester: Secondary | ICD-10-CM

## 2013-12-21 DIAGNOSIS — O212 Late vomiting of pregnancy: Secondary | ICD-10-CM | POA: Insufficient documentation

## 2013-12-21 DIAGNOSIS — O4700 False labor before 37 completed weeks of gestation, unspecified trimester: Secondary | ICD-10-CM | POA: Insufficient documentation

## 2013-12-21 DIAGNOSIS — O4703 False labor before 37 completed weeks of gestation, third trimester: Secondary | ICD-10-CM

## 2013-12-21 DIAGNOSIS — N949 Unspecified condition associated with female genital organs and menstrual cycle: Secondary | ICD-10-CM | POA: Insufficient documentation

## 2013-12-21 DIAGNOSIS — O219 Vomiting of pregnancy, unspecified: Secondary | ICD-10-CM

## 2013-12-21 DIAGNOSIS — Z1389 Encounter for screening for other disorder: Secondary | ICD-10-CM

## 2013-12-21 DIAGNOSIS — O289 Unspecified abnormal findings on antenatal screening of mother: Secondary | ICD-10-CM

## 2013-12-21 DIAGNOSIS — Z331 Pregnant state, incidental: Secondary | ICD-10-CM

## 2013-12-21 LAB — URINALYSIS, ROUTINE W REFLEX MICROSCOPIC
Glucose, UA: NEGATIVE mg/dL
Ketones, ur: NEGATIVE mg/dL
Leukocytes, UA: NEGATIVE
pH: 7 (ref 5.0–8.0)

## 2013-12-21 LAB — COMPREHENSIVE METABOLIC PANEL
Alkaline Phosphatase: 96 U/L (ref 39–117)
BUN: 5 mg/dL — ABNORMAL LOW (ref 6–23)
Chloride: 102 mEq/L (ref 96–112)
Creatinine, Ser: 0.59 mg/dL (ref 0.50–1.10)
GFR calc Af Amer: >90 mL/min (ref >90)
GFR calc non Af Amer: >90 mL/min (ref >90)
Glucose, Bld: 102 mg/dL — ABNORMAL HIGH (ref 70–99)
Potassium: 3.4 mEq/L — ABNORMAL LOW (ref 3.7–5.3)
Total Bilirubin: 0.2 mg/dL — ABNORMAL LOW (ref 0.3–1.2)

## 2013-12-21 LAB — CBC
HCT: 30.5 % — ABNORMAL LOW (ref 36.0–46.0)
Hemoglobin: 10.1 g/dL — ABNORMAL LOW (ref 12.0–15.0)
MCV: 80.5 fL (ref 78.0–100.0)
RBC: 3.79 MIL/uL — ABNORMAL LOW (ref 3.87–5.11)
WBC: 9.7 10*3/uL (ref 4.0–10.5)

## 2013-12-21 LAB — POCT URINALYSIS DIPSTICK
Glucose, UA: NEGATIVE
Ketones, UA: NEGATIVE
Leukocytes, UA: NEGATIVE
Protein, UA: NEGATIVE

## 2013-12-21 LAB — PROTEIN / CREATININE RATIO, URINE
Creatinine, Urine: 15.83 mg/dL
Protein Creatinine Ratio: 0.37 — ABNORMAL HIGH (ref 0.00–0.15)
Total Protein, Urine: 5.9 mg/dL

## 2013-12-21 MED ORDER — NALBUPHINE SYRINGE 5 MG/0.5 ML
10.0000 mg | INJECTION | Freq: Once | INTRAMUSCULAR | Status: AC
  Administered 2013-12-21: 10 mg via INTRAMUSCULAR
  Filled 2013-12-21: qty 1

## 2013-12-21 MED ORDER — PROMETHAZINE HCL 25 MG/ML IJ SOLN
25.0000 mg | Freq: Once | INTRAVENOUS | Status: AC
  Administered 2013-12-21: 25 mg via INTRAVENOUS
  Filled 2013-12-21: qty 1

## 2013-12-21 NOTE — MAU Note (Signed)
Pt reports increasing pain and pressure since midnight tonight.

## 2013-12-21 NOTE — Progress Notes (Signed)
Pt just discharged from [facility] 7 hours ago with same complaints. Thinks she is having enough contractions to be in labor. Cervix  No change No real recommendations except this is not labor, labor will be different and more intense

## 2013-12-21 NOTE — Progress Notes (Addendum)
Dr. Marice Potter called to get update on pt. Reviewed pt's complaints and vital signs. Per Dr. Marice Potter pt to be discharged home after receiving pain medication.

## 2013-12-22 ENCOUNTER — Other Ambulatory Visit: Payer: Medicaid Other | Admitting: Obstetrics & Gynecology

## 2013-12-23 ENCOUNTER — Inpatient Hospital Stay (HOSPITAL_COMMUNITY)
Admission: AD | Admit: 2013-12-23 | Discharge: 2013-12-24 | Disposition: A | Discharge status: Home / Self Care | Payer: Medicaid Other | Source: Ambulatory Visit | Attending: Obstetrics & Gynecology | Admitting: Obstetrics & Gynecology

## 2013-12-23 ENCOUNTER — Encounter (HOSPITAL_COMMUNITY): Payer: Self-pay

## 2013-12-23 DIAGNOSIS — O4700 False labor before 37 completed weeks of gestation, unspecified trimester: Secondary | ICD-10-CM | POA: Insufficient documentation

## 2013-12-23 DIAGNOSIS — O26859 Spotting complicating pregnancy, unspecified trimester: Secondary | ICD-10-CM | POA: Insufficient documentation

## 2013-12-23 DIAGNOSIS — O479 False labor, unspecified: Secondary | ICD-10-CM

## 2013-12-23 DIAGNOSIS — O4703 False labor before 37 completed weeks of gestation, third trimester: Secondary | ICD-10-CM

## 2013-12-23 MED ORDER — OXYCODONE-ACETAMINOPHEN 5-325 MG PO TABS
2.0000 | ORAL_TABLET | Freq: Once | ORAL | Status: AC
  Administered 2013-12-23: 2 via ORAL
  Filled 2013-12-23: qty 2

## 2013-12-23 NOTE — L&D Delivery Note (Signed)
Delivery Note At 9:13 AM a viable female was delivered via Vaginal, Spontaneous Delivery (Presentation OA>LOA:  ). Immediate cry  APGAR: , ; weight .   Placenta status: Intact, Spontaneous.  Cord: 3 vessels with the following complications: None.    Anesthesia: Epidural  Episiotomy: None Lacerations: 1st degree Suture Repair: vicryl rapide 1 stitch Est. Blood Loss (mL): 300  Mom to postpartum.  Baby to Couplet care / Skin to Skin.  Odies Desa 01/10/2014, 9:34 AM

## 2013-12-23 NOTE — Discharge Instructions (Signed)
Preterm Labor Information Preterm labor is when labor starts at less than 37 weeks of pregnancy. The normal length of a pregnancy is 39 to 41 weeks. CAUSES Often, there is no identifiable underlying cause as to why a woman goes into preterm labor. One of the most common known causes of preterm labor is infection. Infections of the uterus, cervix, vagina, amniotic sac, bladder, kidney, or even the lungs (pneumonia) can cause labor to start. Other suspected causes of preterm labor include:   Urogenital infections, such as yeast infections and bacterial vaginosis.   Uterine abnormalities (uterine shape, uterine septum, fibroids, or bleeding from the placenta).   A cervix that has been operated on (it may fail to stay closed).   Malformations in the fetus.   Multiple gestations (twins, triplets, and so on).   Breakage of the amniotic sac.  RISK FACTORS  Having a previous history of preterm labor.   Having premature rupture of membranes (PROM).   Having a placenta that covers the opening of the cervix (placenta previa).   Having a placenta that separates from the uterus (placental abruption).   Having a cervix that is too weak to hold the fetus in the uterus (incompetent cervix).   Having too much fluid in the amniotic sac (polyhydramnios).   Taking illegal drugs or smoking while pregnant.   Not gaining enough weight while pregnant.   Being younger than 18 and older than 35 years old.   Having a low socioeconomic status.   Being African American. SYMPTOMS Signs and symptoms of preterm labor include:   Menstrual-like cramps, abdominal pain, or back pain.  Uterine contractions that are regular, as frequent as six in an hour, regardless of their intensity (may be mild or painful).  Contractions that start on the top of the uterus and spread down to the lower abdomen and back.   A sense of increased pelvic pressure.   A watery or bloody mucus discharge that  comes from the vagina.  TREATMENT Depending on the length of the pregnancy and other circumstances, your health care provider may suggest bed rest. If necessary, there are medicines that can be given to stop contractions and to mature the fetal lungs. If labor happens before 34 weeks of pregnancy, a prolonged hospital stay may be recommended. Treatment depends on the condition of both you and the fetus.  WHAT SHOULD YOU DO IF YOU THINK YOU ARE IN PRETERM LABOR? Call your health care provider right away. You will need to go to the hospital to get checked immediately. HOW CAN YOU PREVENT PRETERM LABOR IN FUTURE PREGNANCIES? You should:   Stop smoking if you smoke.  Maintain healthy weight gain and avoid chemicals and drugs that are not necessary.  Be watchful for any type of infection.  Inform your health care provider if you have a known history of preterm labor. Document Released: 02/29/2004 Document Revised: 08/11/2013 Document Reviewed: 01/11/2013 [employer] Patient Information 2014 [employer].    

## 2013-12-23 NOTE — MAU Note (Signed)
Pt c/o uc and abdominal pressure since last night. Also c/o some spotting when wiping. Denies LOF. +FM

## 2013-12-23 NOTE — MAU Provider Note (Signed)
History     CSN: [id number]  Arrival date and time: 12/23/13 2029   First Provider Initiated Contact with Patient 12/23/13 2126      No chief complaint on file.  HPI This is a 33 y.o. female at [redacted]w[redacted]d who presents with c/o contractions every 3-4 minutes since  9am today. Took Procardia this morning and this evening. No leaking or bleeding.  RN Note: Pt c/o uc and abdominal pressure since last night. Also c/o some spotting when wiping. Denies LOF. +FM       OB History   Grav Para Term Preterm Abortions TAB SAB Ect Mult Living   4 3 3       3       Past Medical History  Diagnosis Date  . Pregnant   . Kidney stones     Past Surgical History  Procedure Laterality Date  . Kidney stents    . Lithotripsy    . Appendectomy      Family History  Problem Relation Age of Onset  . Cancer Mother     breast  . Diabetes Maternal Grandmother     History  Substance Use Topics  . Smoking status: Never Smoker   . Smokeless tobacco: Never Used  . Alcohol Use: No    Allergies: No Known Allergies  Prescriptions prior to admission  Medication Sig Dispense Refill  . acetaminophen (TYLENOL) 500 MG tablet Take 1,000 mg by mouth every 6 (six) hours as needed for mild pain or moderate pain.       . butalbital-acetaminophen-caffeine (FIORICET) 50-325-40 MG per tablet Take 1-2 tablets by mouth every 6 (six) hours as needed for headache.  20 tablet  0  . calcium carbonate (TUMS - DOSED IN MG ELEMENTAL CALCIUM) 500 MG chewable tablet Chew 2 tablets by mouth See admin instructions. Patient takes 2-3 chewables very 10-15 minutes.      Marland Kitchen NIFEdipine (PROCARDIA-XL/ADALAT-CC/NIFEDICAL-XL) 30 MG 24 hr tablet Take 60 mg by mouth 2 (two) times daily.      Marland Kitchen omeprazole (PRILOSEC) 20 MG capsule Take 1 capsule (20 mg total) by mouth daily.  30 capsule  6  . potassium chloride (K-DUR,KLOR-CON) 10 MEQ tablet Take 10 mEq by mouth 2 (two) times daily.      . Prenatal Vit-Fe Fumarate-FA (PRENATAL  MULTIVITAMIN) TABS tablet Take 1 tablet by mouth daily at 12 noon.      . promethazine (PHENERGAN) 50 MG tablet Take 50 mg by mouth every 6 (six) hours as needed for nausea or vomiting.      Marland Kitchen zolpidem (AMBIEN) 5 MG tablet Take 1 tablet (5 mg total) by mouth at bedtime as needed for sleep.  30 tablet  0    Review of Systems  Constitutional: Negative for fever, chills and malaise/fatigue.  Gastrointestinal: Positive for abdominal pain. Negative for nausea, vomiting, diarrhea and constipation.  Genitourinary: Negative for dysuria.  Neurological: Negative for dizziness.   Physical Exam   Blood pressure 136/88, pulse 96, temperature 98.2 F (36.8 C), temperature source Oral, resp. rate 20, last menstrual period 04/10/2013, SpO2 100.00%.  Physical Exam  Constitutional: She is oriented to person, place, and time. She appears well-developed and well-nourished. No distress.  Cardiovascular: Normal rate.   Respiratory: Effort normal.  GI: Soft. She exhibits no distension. There is no tenderness. There is no rebound and no guarding.  Genitourinary: Uterus normal. Vaginal discharge (mucous) found.  Dilation: 1.5 Effacement (%): Thick Cervical Position: Posterior Station: -3 Presentation: Vertex Exam by:: Artelia Laroche  CNM   Musculoskeletal: Normal range of motion.  Neurological: She is alert and oriented to person, place, and time.  Skin: Skin is warm and dry.  Psychiatric: She has a normal mood and affect.    MAU Course  Procedures  MDM Discussed with Dr Macon LargeAnyanwu. Will treat as labor check No need for more tocolysis.   Cervix unchanged after 1+ hours. FHR reactive UCs irregular every 3-4 min Will medicate for pain and discharge home   Assessment and Plan  A:  SIUP at 7932w4d       Contractions      No change in cervix  P:  Dischargehome       Has appt tomorrow at office.        Labor precautions  Wynelle BourgeoisWILLIAMS,Gor Vestal 12/23/2013, 11:27 PM

## 2013-12-24 ENCOUNTER — Other Ambulatory Visit: Payer: Medicaid Other

## 2013-12-24 ENCOUNTER — Encounter: Payer: Self-pay | Admitting: Advanced Practice Midwife

## 2013-12-24 ENCOUNTER — Encounter: Payer: Self-pay | Admitting: Obstetrics and Gynecology

## 2013-12-24 ENCOUNTER — Ambulatory Visit (INDEPENDENT_AMBULATORY_CARE_PROVIDER_SITE_OTHER): Payer: Medicaid Other | Admitting: Obstetrics and Gynecology

## 2013-12-24 VITALS — BP 120/80 | Wt 187.0 lb

## 2013-12-24 DIAGNOSIS — O99019 Anemia complicating pregnancy, unspecified trimester: Secondary | ICD-10-CM

## 2013-12-24 DIAGNOSIS — Z1389 Encounter for screening for other disorder: Secondary | ICD-10-CM

## 2013-12-24 DIAGNOSIS — O4703 False labor before 37 completed weeks of gestation, third trimester: Secondary | ICD-10-CM

## 2013-12-24 DIAGNOSIS — O289 Unspecified abnormal findings on antenatal screening of mother: Secondary | ICD-10-CM

## 2013-12-24 DIAGNOSIS — O10913 Unspecified pre-existing hypertension complicating pregnancy, third trimester: Secondary | ICD-10-CM

## 2013-12-24 DIAGNOSIS — O479 False labor, unspecified: Secondary | ICD-10-CM

## 2013-12-24 DIAGNOSIS — Z331 Pregnant state, incidental: Secondary | ICD-10-CM

## 2013-12-24 DIAGNOSIS — O139 Gestational [pregnancy-induced] hypertension without significant proteinuria, unspecified trimester: Secondary | ICD-10-CM

## 2013-12-24 DIAGNOSIS — O4700 False labor before 37 completed weeks of gestation, unspecified trimester: Secondary | ICD-10-CM | POA: Insufficient documentation

## 2013-12-24 LAB — POCT URINALYSIS DIPSTICK
Blood, UA: NEGATIVE
Glucose, UA: NEGATIVE
KETONES UA: NEGATIVE
Leukocytes, UA: NEGATIVE
Nitrite, UA: NEGATIVE
PROTEIN UA: NEGATIVE

## 2013-12-24 MED ORDER — PROMETHAZINE HCL 25 MG RE SUPP: 25.0000 mg | Freq: Four times a day (QID) | RECTAL | Status: DC | PRN

## 2013-12-24 NOTE — Progress Notes (Signed)
[facility] and [facility] contractions every 3 minutes, and that she was dilated to a 2 but they sent her home. PT states that she is still having those contractions today.

## 2013-12-24 NOTE — MAU Provider Note (Signed)
Attestation of Attending Supervision of Advanced Practitioner (PA/CNM/NP): Evaluation and management procedures were performed by the Advanced Practitioner under my supervision and collaboration.  I have reviewed the Advanced Practitioner's note and chart, and I agree with the management and plan.  Maliea Grandmaison, MD, FACOG Attending Obstetrician & Gynecologist Faculty Practice, [facility]  

## 2013-12-24 NOTE — MAU Provider Note (Signed)
Late Entry  Chief Complaint:  Abdominal Pain and Pelvic Pain   First Provider Initiated Contact with Patient 12/21/13 0237      HPI: [name] is a 33 y.o. G4P3003 at [redacted]w[redacted]d who presents to maternity admissions reporting nausea, vomiting, contractions and pelvic pressure. Denies fever, chills, leakage of fluid, vaginal bleeding, urinary complaints or diarrhea. Has has HA and scotoma w/in last few days, but none now. Good fetal movement.   Pregnancy Course: Gest GTN. BPP 12/29 8/8.   Past Medical History: Past Medical History  Diagnosis Date  . Pregnant   . Kidney stones     Past obstetric history: OB History  Gravida Para Term Preterm AB SAB TAB Ectopic Multiple Living  4 3 3       3     # Outcome Date GA Lbr Len/2nd Weight Sex Delivery Anes PTL Lv  4 CUR           3 TRM 01/10/06 [redacted]w[redacted]d  3.402 kg (7 lb 8 oz) F SVD EPI  Y  2 TRM 09/02/01 [redacted]w[redacted]d  4.139 kg (9 lb 2 oz) M SVD EPI  Y  1 TRM 01/24/97 [redacted]w[redacted]d  2.835 kg (6 lb 4 oz) F SVD EPI  Y      Past Surgical History: Past Surgical History  Procedure Laterality Date  . Kidney stents    . Lithotripsy    . Appendectomy       Family History: Family History  Problem Relation Age of Onset  . Cancer Mother     breast  . Diabetes Maternal Grandmother     Social History: History  Substance Use Topics  . Smoking status: Never Smoker   . Smokeless tobacco: Never Used  . Alcohol Use: No    Allergies: No Known Allergies  Meds:  No prescriptions prior to admission    ROS: Pertinent findings in history of present illness.  Physical Exam  Blood pressure 148/88, pulse 92, temperature 98.5 F (36.9 C), temperature source Oral, resp. rate 20, height 5\' 3"  (1.6 m), weight 85.276 kg (188 lb), last menstrual period 04/10/2013, SpO2 100.00%. GENERAL: Well-developed, well-nourished female in mild distress, actively vomiting.  HEENT: normocephalic HEART: normal rate RESP: normal effort ABDOMEN: Soft, non-tender, gravid  appropriate for gestational age. No CVAT.  EXTREMITIES: Nontender, no edema NEURO: alert and oriented SPECULUM EXAM: NEFG, physiologic discharge, no blood, cervix clean Dilation: Fingertip Effacement (%): Thick Cervical Position: Posterior Station: -3 Exam by:: Judeth Horn [name]  FHT:  Baseline 150 , moderate variability, accelerations present, no decelerations Contractions: q 2-4 mins, mild   Labs: URINALYSIS, ROUTINE W REFLEX MICROSCOPIC   Collection Time    12/21/13  1:46 AM      Result Value Range   Color, Urine YELLOW  YELLOW   APPearance CLEAR  CLEAR   Specific Gravity, Urine <1.005 (*) 1.005 - 1.030   pH 7.0  5.0 - 8.0   Glucose, UA NEGATIVE  NEGATIVE mg/dL   Hgb urine dipstick LARGE (*) NEGATIVE   Bilirubin Urine NEGATIVE  NEGATIVE   Ketones, ur NEGATIVE  NEGATIVE mg/dL   Protein, ur NEGATIVE  NEGATIVE mg/dL   Urobilinogen, UA 0.2  0.0 - 1.0 mg/dL   Nitrite NEGATIVE  NEGATIVE   Leukocytes, UA NEGATIVE  NEGATIVE  PROTEIN / CREATININE RATIO, URINE   Collection Time    12/21/13  1:46 AM      Result Value Range   Creatinine, Urine 15.83     Total Protein, Urine 5.9  PROTEIN CREATININE RATIO 0.37 (*) 0.00 - 0.15  URINE MICROSCOPIC-ADD ON   Collection Time    12/21/13  1:46 AM      Result Value Range   Squamous Epithelial / LPF RARE  RARE   WBC, UA 0-2  <3 WBC/hpf   RBC / HPF 3-6  <3 RBC/hpf   Bacteria, UA RARE  RARE  CBC   Collection Time    12/21/13  2:30 AM      Result Value Range   WBC 9.7  4.0 - 10.5 K/uL   RBC 3.79 (*) 3.87 - 5.11 MIL/uL   Hemoglobin 10.1 (*) 12.0 - 15.0 g/dL   HCT 10.1 (*) 75.1 - 02.5 %   MCV 80.5  78.0 - 100.0 fL   MCH 26.6  26.0 - 34.0 pg   MCHC 33.1  30.0 - 36.0 g/dL   RDW 85.2 (*) 77.8 - 24.2 %   Platelets 241  150 - 400 K/uL  COMPREHENSIVE METABOLIC PANEL   Collection Time    12/21/13  2:30 AM      Result Value Range   Sodium 138  137 - 147 mEq/L   Potassium 3.4 (*) 3.7 - 5.3 mEq/L   Chloride 102  96 - 112 mEq/L   CO2  21  19 - 32 mEq/L   Glucose, Bld 102 (*) 70 - 99 mg/dL   BUN 5 (*) 6 - 23 mg/dL   Creatinine, Ser 3.53  0.50 - 1.10 mg/dL   Calcium 9.3  8.4 - 61.4 mg/dL   Total Protein 6.2  6.0 - 8.3 g/dL   Albumin 2.6 (*) 3.5 - 5.2 g/dL   AST 25  0 - 37 U/L   ALT 21  0 - 35 U/L   Alkaline Phosphatase 96  39 - 117 U/L   Total Bilirubin 0.2 (*) 0.3 - 1.2 mg/dL   GFR calc non Af Amer >90  >90 mL/min   GFR calc Af Amer >90  >90 mL/min    Imaging:  US Ob Follow Up  12/20/2013   FOLLOW UP SONOGRAM   [name] is in the office for a follow up sonogram for  BPP/REP/UAD/AFI.  She is a 33 y.o. year old G40P3003 with Estimated Date of Delivery: 01/30/14  now at  [redacted]w[redacted]d weeks gestation. Thus far the pregnancy has been complicated  by [facility].  GESTATION: SINGLETON  PRESENTATION: cephalic  FETAL ACTIVITY:          Heart rate         155 bpm          The fetus is active.  AMNIOTIC FLUID: The amniotic fluid volume is  normal, 11.4 cm.  PLACENTA LOCALIZATION:  anterior GRADE 1  CERVIX: Unable to visualize  ADNEXA: The adnexa appears normal.   GESTATIONAL AGE AND  BIOMETRICS:  Gestational criteria: Estimated Date of Delivery: 01/30/14  now at [redacted]w[redacted]d  Previous Scans:  GESTATIONAL SAC            mm          weeks  CROWN RUMP LENGTH            mm          weeks  NUCHAL TRANSLUCENCY            mm           BIPARIETAL DIAMETER           8.96 cm         36+2  weeks  HEAD CIRCUMFERENCE           32.63 cm         37+0 weeks  ABDOMINAL CIRCUMFERENCE           31.92 cm         35+6 weeks  FEMUR LENGTH           6.71 cm         34+4 weeks                                                           AVERAGE EGA(BY THIS SCAN):   35+6 weeks                                                 ESTIMATED FETAL WEIGHT:        2712  grams, 63 % ANATOMICAL SURVEY                                                                             COMMENTS CEREBRAL VENTRICLES yes normal   CHOROID PLEXUS yes normal   CEREBELLUM yes normal   CISTERNA MAGNA yes normal    NUCHAL REGION yes normal   ORBITS     NASAL BONE     NOSE/LIP     FACIAL PROFILE     4 CHAMBERED HEART     OUTFLOW TRACTS     DIAPHRAGM yes normal   STOMACH yes normal   RENAL REGION yes normal   BLADDER yes normal   CORD INSERTION     3 VESSEL CORD yes normal   SPINE     ARMS/HANDS     LEGS/FEET     GENITALIA   female        BIOPHYSCIAL PROFILE:                                                                                                       COMMENTS GROSS BODY MOVEMENT                 2   TONE                2   RESPIRATIONS                2   AMNIOTIC FLUID                2  SCORE:  8/8 (Note: NST was not performed as part of this antepartum testing)   DOPPLER FLOW STUDIES: UMBILICAL ARTERY RI RATIOS:   0.60, 0.52    SUSPECTED ABNORMALITIES:  no  QUALITY OF SCAN: satisfactory  TECHNICIAN COMMENTS:  U/S(34+4wks)-vtx active fetus BPP 8/8, fluid wnl AFI_11.4cm, anterior Gr 2  placenta, UA Doppler RI-0.60 & 0.52, female fetus "Clifton Custard", EFW 6 lb  (63rd%tile), FHR-155 bpm      A copy of this report including all images has been saved and backed up to  a second source for retrieval if needed. All measures and details of the  anatomical scan, placentation, fluid volume and pelvic anatomy are  contained in that report.  Chari Manning 12/20/2013 12:10 PM       US Fetal Bpp W/o Non Stress  12/20/2013   FOLLOW UP SONOGRAM   [name] is in the office for a follow up sonogram for  BPP/REP/UAD/AFI.  She is a 33 y.o. year old G45P3003 with Estimated Date of Delivery: 01/30/14  now at  [redacted]w[redacted]d weeks gestation. Thus far the pregnancy has been complicated  by [facility].  GESTATION: SINGLETON  PRESENTATION: cephalic  FETAL ACTIVITY:          Heart rate         155 bpm          The fetus is active.  AMNIOTIC FLUID: The amniotic fluid volume is  normal, 11.4 cm.  PLACENTA LOCALIZATION:  anterior GRADE 1  CERVIX: Unable to visualize  ADNEXA: The adnexa appears normal.    GESTATIONAL AGE AND  BIOMETRICS:  Gestational criteria: Estimated Date of Delivery: 01/30/14  now at [redacted]w[redacted]d  Previous Scans:  GESTATIONAL SAC            mm          weeks  CROWN RUMP LENGTH            mm          weeks  NUCHAL TRANSLUCENCY            mm           BIPARIETAL DIAMETER           8.96 cm         36+2 weeks  HEAD CIRCUMFERENCE           32.63 cm         37+0 weeks  ABDOMINAL CIRCUMFERENCE           31.92 cm         35+6 weeks  FEMUR LENGTH           6.71 cm         34+4 weeks                                                           AVERAGE EGA(BY THIS SCAN):   35+6 weeks                                                 ESTIMATED FETAL WEIGHT:        2712  grams, 63 % ANATOMICAL SURVEY  COMMENTS CEREBRAL VENTRICLES yes normal   CHOROID PLEXUS yes normal   CEREBELLUM yes normal   CISTERNA MAGNA yes normal   NUCHAL REGION yes normal   ORBITS     NASAL BONE     NOSE/LIP     FACIAL PROFILE     4 CHAMBERED HEART     OUTFLOW TRACTS     DIAPHRAGM yes normal   STOMACH yes normal   RENAL REGION yes normal   BLADDER yes normal   CORD INSERTION     3 VESSEL CORD yes normal   SPINE     ARMS/HANDS     LEGS/FEET     GENITALIA   female        BIOPHYSCIAL PROFILE:                                                                                                       COMMENTS GROSS BODY MOVEMENT                 2   TONE                2   RESPIRATIONS                2   AMNIOTIC FLUID                2                                                            SCORE:  8/8 (Note: NST was not performed as part of this antepartum testing)   DOPPLER FLOW STUDIES: UMBILICAL ARTERY RI RATIOS:   0.60, 0.52    SUSPECTED ABNORMALITIES:  no  QUALITY OF SCAN: satisfactory  TECHNICIAN COMMENTS:  U/S(34+4wks)-vtx active fetus BPP 8/8, fluid wnl AFI_11.4cm, anterior Gr 2  placenta, UA Doppler RI-0.60 & 0.52, female fetus "Clifton Custard", EFW 6 lb  (63rd%tile), FHR-155 bpm      A  copy of this report including all images has been saved and backed up to  a second source for retrieval if needed. All measures and details of the  anatomical scan, placentation, fluid volume and pelvic anatomy are  contained in that report.  Chari Manning 12/20/2013 12:10 PM       [place] Ua Doppler Add'l Gest Re-eval  12/20/2013   FOLLOW UP SONOGRAM   [name] is in the office for a follow up sonogram for  BPP/REP/UAD/AFI.  She is a 33 y.o. year old G52P3003 with Estimated Date of Delivery: 01/30/14  now at  [redacted]w[redacted]d weeks gestation. Thus far the pregnancy has been complicated  by [facility].  GESTATION: SINGLETON  PRESENTATION: cephalic  FETAL ACTIVITY:          Heart rate         155 bpm  The fetus is active.  AMNIOTIC FLUID: The amniotic fluid volume is  normal, 11.4 cm.  PLACENTA LOCALIZATION:  anterior GRADE 1  CERVIX: Unable to visualize  ADNEXA: The adnexa appears normal.   GESTATIONAL AGE AND  BIOMETRICS:  Gestational criteria: Estimated Date of Delivery: 01/30/14  now at [redacted]w[redacted]d  Previous Scans:  GESTATIONAL SAC            mm          weeks  CROWN RUMP LENGTH            mm          weeks  NUCHAL TRANSLUCENCY            mm           BIPARIETAL DIAMETER           8.96 cm         36+2 weeks  HEAD CIRCUMFERENCE           32.63 cm         37+0 weeks  ABDOMINAL CIRCUMFERENCE           31.92 cm         35+6 weeks  FEMUR LENGTH           6.71 cm         34+4 weeks                                                           AVERAGE EGA(BY THIS SCAN):   35+6 weeks                                                 ESTIMATED FETAL WEIGHT:        2712  grams, 63 % ANATOMICAL SURVEY                                                                             COMMENTS CEREBRAL VENTRICLES yes normal   CHOROID PLEXUS yes normal   CEREBELLUM yes normal   CISTERNA MAGNA yes normal   NUCHAL REGION yes normal   ORBITS     NASAL BONE     NOSE/LIP     FACIAL PROFILE     4 CHAMBERED HEART     OUTFLOW TRACTS     DIAPHRAGM yes normal    STOMACH yes normal   RENAL REGION yes normal   BLADDER yes normal   CORD INSERTION     3 VESSEL CORD yes normal   SPINE     ARMS/HANDS     LEGS/FEET     GENITALIA   female        BIOPHYSCIAL PROFILE:  COMMENTS GROSS BODY MOVEMENT                 2   TONE                2   RESPIRATIONS                2   AMNIOTIC FLUID                2                                                            SCORE:  8/8 (Note: NST was not performed as part of this antepartum testing)   DOPPLER FLOW STUDIES: UMBILICAL ARTERY RI RATIOS:   0.60, 0.52    SUSPECTED ABNORMALITIES:  no  QUALITY OF SCAN: satisfactory  TECHNICIAN COMMENTS:  U/S(34+4wks)-vtx active fetus BPP 8/8, fluid wnl AFI_11.4cm, anterior Gr 2  placenta, UA Doppler RI-0.60 & 0.52, female fetus "Clifton Custardaron", EFW 6 lb  (63rd%tile), FHR-155 bpm      A copy of this report including all images has been saved and backed up to  a second source for retrieval if needed. All measures and details of the  anatomical scan, placentation, fluid volume and pelvic anatomy are  contained in that report.  Chari ManningMcBride, Tasha 12/20/2013 12:10 PM       MAU Course: Phenergan, IV bolus, CBC, CMET, protein creatinine ratio order.  CNM in delivery. Informed Dr. Marice Potterove of contractions, BPs, labs normal except elevated P:C ratio (but w/ large hgb) and pt Hx of PIH Sx. MD got update from RN, ordered Nubain and D/C'd pt home.   Assessment: 1. Gestational hypertension, third trimester   2. Preterm uterine contractions, third trimester   3. Nausea and vomiting of pregnancy, antepartum    Plan: Discharge home per Dr. Marice Potterove. Pre-E, and preterm labor precautions and fetal kick counts Follow-up Information   Follow up with [facility] On 12/22/2013. (for scheduled appointment)    Contact information:   [address] [place] [phone]      Follow [facility] with [facility]. (As needed if symptoms worsen)    Contact information:   [id number] [place] [id number] [phone]       Medication List    ASK your doctor about these medications       acetaminophen 500 MG tablet  Commonly known as:  TYLENOL  Take 1,000 mg by mouth every 6 (six) hours as needed for mild pain or moderate pain.     butalbital-acetaminophen-caffeine 50-325-40 MG per tablet  Commonly known as:  FIORICET  Take 1-2 tablets by mouth every 6 (six) hours as needed for headache.     calcium carbonate 500 MG chewable tablet  Commonly known as:  TUMS - dosed in mg elemental calcium  Chew 2 tablets by mouth See admin instructions. Patient takes 2-3 chewables very 10-15 minutes.     NIFEdipine 30 MG 24 hr tablet  Commonly known as:  PROCARDIA-XL/ADALAT-CC/NIFEDICAL-XL  Take 60 mg by mouth 2 (two) times daily.     omeprazole 20 MG capsule  Commonly known as:  PRILOSEC  Take 1 capsule (20 mg total) by mouth daily.  potassium chloride 10 MEQ tablet  Commonly known as:  K-DUR,KLOR-CON  Take 10 mEq by mouth 2 (two) times daily.     prenatal multivitamin Tabs tablet  Take 1 tablet by mouth daily at 12 noon.     promethazine 50 MG tablet  Commonly known as:  PHENERGAN  Take 50 mg by mouth every 6 (six) hours as needed for nausea or vomiting.     zolpidem 5 MG tablet  Commonly known as:  AMBIEN  Take 1 tablet (5 mg total) by mouth at bedtime as needed for sleep.        [place], CNM 12/24/2013 8:21 AM

## 2013-12-25 ENCOUNTER — Encounter (HOSPITAL_COMMUNITY): Payer: Self-pay | Admitting: *Deleted

## 2013-12-25 ENCOUNTER — Inpatient Hospital Stay (HOSPITAL_COMMUNITY)
Admission: AD | Admit: 2013-12-25 | Discharge: 2013-12-26 | Disposition: A | Discharge status: Home / Self Care | Payer: Medicaid Other | Source: Ambulatory Visit | Attending: Family Medicine | Admitting: Family Medicine

## 2013-12-25 DIAGNOSIS — O212 Late vomiting of pregnancy: Secondary | ICD-10-CM | POA: Insufficient documentation

## 2013-12-25 DIAGNOSIS — O4700 False labor before 37 completed weeks of gestation, unspecified trimester: Secondary | ICD-10-CM | POA: Insufficient documentation

## 2013-12-25 MED ORDER — ONDANSETRON 8 MG PO TBDP
8.0000 mg | ORAL_TABLET | Freq: Once | ORAL | Status: AC
  Administered 2013-12-26: 8 mg via ORAL
  Filled 2013-12-25: qty 1

## 2013-12-25 NOTE — MAU Note (Signed)
Nausea and vomiting all day, unable to keep anything down. Contractions every 3 minutes with vaginal burning and pressure.

## 2013-12-26 DIAGNOSIS — O479 False labor, unspecified: Secondary | ICD-10-CM

## 2013-12-26 LAB — CBC
HCT: 30.4 % — ABNORMAL LOW (ref 36.0–46.0)
HEMOGLOBIN: 10 g/dL — AB (ref 12.0–15.0)
MCH: 26.4 pg (ref 26.0–34.0)
MCHC: 32.9 g/dL (ref 30.0–36.0)
MCV: 80.2 fL (ref 78.0–100.0)
PLATELETS: 231 10*3/uL (ref 150–400)
RBC: 3.79 MIL/uL — ABNORMAL LOW (ref 3.87–5.11)
RDW: 16.1 % — ABNORMAL HIGH (ref 11.5–15.5)
WBC: 8.9 10*3/uL (ref 4.0–10.5)

## 2013-12-26 LAB — COMPREHENSIVE METABOLIC PANEL
ALT: 17 U/L (ref 0–35)
AST: 22 U/L (ref 0–37)
Albumin: 2.4 g/dL — ABNORMAL LOW (ref 3.5–5.2)
Alkaline Phosphatase: 103 U/L (ref 39–117)
BUN: 5 mg/dL — ABNORMAL LOW (ref 6–23)
CALCIUM: 9.6 mg/dL (ref 8.4–10.5)
CO2: 22 mEq/L (ref 19–32)
Chloride: 102 mEq/L (ref 96–112)
Creatinine, Ser: 0.63 mg/dL (ref 0.50–1.10)
GFR calc Af Amer: >90 mL/min (ref >90)
GLUCOSE: 91 mg/dL (ref 70–99)
Potassium: 3.4 mEq/L — ABNORMAL LOW (ref 3.7–5.3)
Sodium: 139 mEq/L (ref 137–147)
TOTAL PROTEIN: 6 g/dL (ref 6.0–8.3)

## 2013-12-26 LAB — URINALYSIS, ROUTINE W REFLEX MICROSCOPIC
Bilirubin Urine: NEGATIVE
Glucose, UA: NEGATIVE mg/dL
Hgb urine dipstick: NEGATIVE
Ketones, ur: NEGATIVE mg/dL
LEUKOCYTES UA: NEGATIVE
Nitrite: NEGATIVE
PROTEIN: NEGATIVE mg/dL
Specific Gravity, Urine: 1.015 (ref 1.005–1.030)
UROBILINOGEN UA: 0.2 mg/dL (ref 0.0–1.0)
pH: 8 (ref 5.0–8.0)

## 2013-12-26 MED ORDER — NALBUPHINE HCL 10 MG/ML IJ SOLN
10.0000 mg | Freq: Once | INTRAMUSCULAR | Status: AC
  Administered 2013-12-26: 10 mg via INTRAMUSCULAR
  Filled 2013-12-26: qty 1

## 2013-12-26 NOTE — H&P (Signed)
[name] is a 33 y.o. female presenting for nausea, vomiting and contractions that started this afternoon.Hx of gestational HTN. Labs all WNL. Denies rom or vag bleeding. Maternal Medical History:  Reason for admission: Contractions and nausea.   Contractions: Onset was 3-5 hours ago.   Frequency: regular.   Perceived severity is mild.    Fetal activity: Perceived fetal activity is normal.   Last perceived fetal movement was within the past hour.      OB History   Grav Para Term Preterm Abortions TAB SAB Ect Mult Living   4 3 3       3      Past Medical History  Diagnosis Date  . Pregnant   . Kidney stones    Past Surgical History  Procedure Laterality Date  . Kidney stents    . Lithotripsy    . Appendectomy     Family History: family history includes Cancer in her mother; Diabetes in her maternal grandmother. Social History:  reports that she has never smoked. She has never used smokeless tobacco. She reports that she does not drink alcohol or use illicit drugs.   Prenatal Transfer Tool  Maternal Diabetes: No Genetic Screening: Normal Maternal Ultrasounds/Referrals: Normal Fetal Ultrasounds or other Referrals:  None Maternal Substance Abuse:  No Significant Maternal Medications:  None Significant Maternal Lab Results:  None Other Comments:  None  Review of Systems  Constitutional: Negative.   Eyes: Negative.   Respiratory: Negative.   Cardiovascular: Negative.   Gastrointestinal: Positive for nausea, vomiting and abdominal pain.  Genitourinary: Negative.   Musculoskeletal: Negative.   Skin: Negative.   Neurological: Positive for headaches.  Endo/Heme/Allergies: Negative.   Psychiatric/Behavioral: Negative.     Dilation: 1 Effacement (%): Thick Station: -3 Exam by:: D. Hart RochesterLawson CNM Blood pressure 145/81, pulse 82, temperature 98.7 F (37.1 C), temperature source Oral, resp. rate 18, last menstrual period 04/10/2013, SpO2 99.00%. Maternal Exam:   Uterine Assessment: Contraction strength is mild.  Contraction frequency is regular.   Abdomen: Patient reports no abdominal tenderness. Fetal presentation: vertex  Introitus: Normal vulva. Normal vagina.    Physical Exam  Constitutional: She is oriented to person, place, and time. She appears well-developed and well-nourished.  HENT:  Head: Normocephalic.  Neck: Normal range of motion.  Cardiovascular: Normal rate, regular rhythm, normal heart sounds and intact distal pulses.   Respiratory: Effort normal and breath sounds normal.  GI: Soft. Bowel sounds are normal.  Genitourinary: Vagina normal and uterus normal.  Musculoskeletal: Normal range of motion.  Neurological: She is alert and oriented to person, place, and time. She has normal reflexes.  Skin: Skin is warm and dry.  Psychiatric: She has a normal mood and affect. Her behavior is normal. Judgment and thought content normal.    Prenatal labs: ABO, Rh: --/--/O POS, O POS (09/24 1600) Antibody: NEG (09/24 1600) Rubella: 1.84 (06/16 1203) RPR: NON REAC (06/16 1203)  HBsAg: NEGATIVE (06/16 1203)  HIV: NON REACTIVE (06/16 1203)  GBS:     Assessment/Plan: Labs statble, FHR pattern reassuring, nausea better. Cervix unchanged. Will d/c home   Johnston, Melissa DARLENE 12/26/2013, 1:15 AM

## 2013-12-26 NOTE — H&P (Signed)
Attestation of Attending Supervision of Advanced Practitioner (PA/CNM/NP): Evaluation and management procedures were performed by the Advanced Practitioner under my supervision and collaboration.  I have reviewed the Advanced Practitioner's note and chart, and I agree with the management and plan.  Ana Woodroof S, MD [facility] Attending 12/26/2013 5:39 AM   

## 2013-12-26 NOTE — Discharge Instructions (Signed)

## 2013-12-27 ENCOUNTER — Other Ambulatory Visit: Payer: Self-pay | Admitting: Obstetrics and Gynecology

## 2013-12-27 DIAGNOSIS — O10013 Pre-existing essential hypertension complicating pregnancy, third trimester: Secondary | ICD-10-CM

## 2013-12-28 ENCOUNTER — Ambulatory Visit (INDEPENDENT_AMBULATORY_CARE_PROVIDER_SITE_OTHER): Payer: Medicaid Other | Admitting: Women's Health

## 2013-12-28 ENCOUNTER — Ambulatory Visit (INDEPENDENT_AMBULATORY_CARE_PROVIDER_SITE_OTHER): Payer: Medicaid Other

## 2013-12-28 ENCOUNTER — Encounter: Payer: Self-pay | Admitting: Women's Health

## 2013-12-28 ENCOUNTER — Encounter (HOSPITAL_COMMUNITY): Payer: Self-pay | Admitting: *Deleted

## 2013-12-28 ENCOUNTER — Inpatient Hospital Stay (HOSPITAL_COMMUNITY)
Admission: AD | Admit: 2013-12-28 | Discharge: 2013-12-28 | Disposition: A | Discharge status: Home / Self Care | Payer: Medicaid Other | Source: Ambulatory Visit | Attending: Obstetrics & Gynecology | Admitting: Obstetrics & Gynecology

## 2013-12-28 VITALS — BP 140/82 | Wt 189.5 lb

## 2013-12-28 DIAGNOSIS — O289 Unspecified abnormal findings on antenatal screening of mother: Secondary | ICD-10-CM

## 2013-12-28 DIAGNOSIS — O133 Gestational [pregnancy-induced] hypertension without significant proteinuria, third trimester: Secondary | ICD-10-CM

## 2013-12-28 DIAGNOSIS — O2341 Unspecified infection of urinary tract in pregnancy, first trimester: Secondary | ICD-10-CM

## 2013-12-28 DIAGNOSIS — O479 False labor, unspecified: Secondary | ICD-10-CM

## 2013-12-28 DIAGNOSIS — O4703 False labor before 37 completed weeks of gestation, third trimester: Secondary | ICD-10-CM

## 2013-12-28 DIAGNOSIS — O99019 Anemia complicating pregnancy, unspecified trimester: Secondary | ICD-10-CM

## 2013-12-28 DIAGNOSIS — O4700 False labor before 37 completed weeks of gestation, unspecified trimester: Secondary | ICD-10-CM | POA: Insufficient documentation

## 2013-12-28 DIAGNOSIS — O0993 Supervision of high risk pregnancy, unspecified, third trimester: Secondary | ICD-10-CM

## 2013-12-28 DIAGNOSIS — Z331 Pregnant state, incidental: Secondary | ICD-10-CM

## 2013-12-28 DIAGNOSIS — O10019 Pre-existing essential hypertension complicating pregnancy, unspecified trimester: Secondary | ICD-10-CM

## 2013-12-28 DIAGNOSIS — O139 Gestational [pregnancy-induced] hypertension without significant proteinuria, unspecified trimester: Secondary | ICD-10-CM

## 2013-12-28 DIAGNOSIS — O212 Late vomiting of pregnancy: Secondary | ICD-10-CM | POA: Insufficient documentation

## 2013-12-28 DIAGNOSIS — Z1389 Encounter for screening for other disorder: Secondary | ICD-10-CM

## 2013-12-28 DIAGNOSIS — O10013 Pre-existing essential hypertension complicating pregnancy, third trimester: Secondary | ICD-10-CM

## 2013-12-28 DIAGNOSIS — O9989 Other specified diseases and conditions complicating pregnancy, childbirth and the puerperium: Secondary | ICD-10-CM

## 2013-12-28 DIAGNOSIS — O99891 Other specified diseases and conditions complicating pregnancy: Secondary | ICD-10-CM | POA: Insufficient documentation

## 2013-12-28 LAB — POCT URINALYSIS DIPSTICK
Blood, UA: NEGATIVE
Glucose, UA: NEGATIVE
Ketones, UA: NEGATIVE
Leukocytes, UA: NEGATIVE
NITRITE UA: NEGATIVE
Protein, UA: NEGATIVE

## 2013-12-28 LAB — POCT FERN TEST: POCT Fern Test: NEGATIVE

## 2013-12-28 MED ORDER — OXYCODONE-ACETAMINOPHEN 5-325 MG PO TABS
1.0000 | ORAL_TABLET | Freq: Once | ORAL | Status: AC
  Administered 2013-12-28: 1 via ORAL
  Filled 2013-12-28: qty 1

## 2013-12-28 MED ORDER — ONDANSETRON 8 MG PO TBDP
8.0000 mg | ORAL_TABLET | Freq: Once | ORAL | Status: AC
  Administered 2013-12-28: 8 mg via ORAL
  Filled 2013-12-28: qty 1

## 2013-12-28 NOTE — MAU Note (Signed)
PT SAYS SHE STARTED HURTING BAD WITH UC  AT 0400.   DENIES HSV AND MRSA.   LAST SEX-  Friday.

## 2013-12-28 NOTE — MAU Provider Note (Signed)
Chief Complaint:  Labor Eval and Rupture of Membranes   [name] is a 33 y.o.  [id number] with IUP at 8964w2d presenting for Labor Eval and Rupture of Membranes  Pt presented with nausea, vomiting, and contractions that started around 4 am.  Has a hx of gHTN but has had r/o labs that were negative.  States has had painful contractions irregularly since around 4 am.  Yesterday while sitting on the toilet, she felt an urge to push and some clear watery fluid came out.  Hasn't had any leaking since then. Thinks her water might have broken.  +FM.  No VB.      Menstrual History: OB History   Grav Para Term Preterm Abortions TAB SAB Ect Mult Living   4 3 3       3        Patient's last menstrual period was 04/10/2013.      Past Medical History  Diagnosis Date  . Pregnant   . Kidney stones     Past Surgical History  Procedure Laterality Date  . Kidney stents    . Lithotripsy    . Appendectomy      Family History  Problem Relation Age of Onset  . Cancer Mother     breast  . Diabetes Maternal Grandmother     History  Substance Use Topics  . Smoking status: Never Smoker   . Smokeless tobacco: Never Used  . Alcohol Use: No     No Known Allergies  Prescriptions prior to admission  Medication Sig Dispense Refill  . acetaminophen (TYLENOL) 500 MG tablet Take 1,000 mg by mouth every 6 (six) hours as needed for mild pain or moderate pain.       . butalbital-acetaminophen-caffeine (FIORICET) 50-325-40 MG per tablet Take 1-2 tablets by mouth every 6 (six) hours as needed for headache.  20 tablet  0  . calcium carbonate (TUMS - DOSED IN MG ELEMENTAL CALCIUM) 500 MG chewable tablet Chew 2 tablets by mouth See admin instructions. Patient takes 2-3 chewables very 10-15 minutes.      Marland Kitchen. NIFEdipine (PROCARDIA-XL/ADALAT-CC/NIFEDICAL-XL) 30 MG 24 hr tablet Take 60 mg by mouth 2 (two) times daily.      . Prenatal Vit-Fe Fumarate-FA (PRENATAL MULTIVITAMIN) TABS tablet Take 1 tablet  by mouth daily at 12 noon.      . promethazine (PHENERGAN) 25 MG suppository Place 1 suppository (25 mg total) rectally every 6 (six) hours as needed for nausea or vomiting.  12 each  1  . zolpidem (AMBIEN) 5 MG tablet Take 1 tablet (5 mg total) by mouth at bedtime as needed for sleep.  30 tablet  0  . omeprazole (PRILOSEC) 20 MG capsule Take 1 capsule (20 mg total) by mouth daily.  30 capsule  6  . potassium chloride (K-DUR,KLOR-CON) 10 MEQ tablet Take 10 mEq by mouth 2 (two) times daily.        Review of Systems - Negative except for what is mentioned in HPI.  Physical Exam  Blood pressure 140/89, pulse 85, temperature 98.3 F (36.8 C), temperature source Oral, resp. rate 20, height 5\' 3"  (1.6 m), weight 85.73 kg (189 lb), last menstrual period 04/10/2013, SpO2 100.00%. GENERAL: Well-developed, well-nourished female in no acute distress.  LUNGS: Clear to auscultation bilaterally.  HEART: Regular rate and rhythm. ABDOMEN: Soft, nontender, nondistended, gravid.  EXTREMITIES: Nontender, no edema, 2+ distal pulses. Cervical Exam: Dilatation 1.5cm   Effacement thick%   Station high   SSE: normal external female  genitalia, normal vagina, normal cervix. No pooling at rest or with valsalva.  Presentation: cephalic FHT:  Baseline rate 140 bpm   Variability moderate  Accelerations present   Decelerations none Contractions: Every 2-5 mins, palpated mild    Labs: No results found for this or any previous visit (from the past 24 hour(s)).  Imaging Studies:     Assessment: [name] is  33 y.o. G4P3003 at [redacted]w[redacted]d presents with Labor Eval and Rupture of Membranes .  Plan: 1) contractions - contractions on TOCO but palpating very mild - cervix essentially unchanged from 12/26/13 exam - pt given a percocet with some improvement in pain - discussed labor precautions and reasons to return   2) ?ROM - neg fern and slide - story unlikely for rupture - precautions discussed and  reassurance given  3) FWB - cat I tracing  4) nausea - treated with zofran x1 here.   F/u as scheduled later today in clinic.    Dvonte Gatliff L 1/6/20156:52 AM

## 2013-12-28 NOTE — Patient Instructions (Signed)
Call the office or go to [facility] if:  You begin to have strong, frequent contractions  Your water breaks.  Sometimes it is a big gush of fluid, sometimes it is just a trickle that keeps getting your panties wet or running down your legs  You have vaginal bleeding.  It is normal to have a small amount of spotting if your cervix was checked.   You don't feel your baby moving like normal.  If you don't, get you something to eat and drink and lay down and focus on feeling your baby move.  You should feel at least 10 movements in 2 hours.  If you don't, you should call the office or go to [facility].    Call office or go to [facility] for headache not relieved by tylenol, visual changes- seeing spots, double, blurred vision, pain under your right breast or heartburn that doesn't go away with tums, etc., nausea and/or vomiting, or other concerns.

## 2013-12-28 NOTE — Progress Notes (Signed)
U/S(35+5wks)-vtx active fetus, female fetus, BPP 8/8, fluid wnl AFI-13.5cm, anterior Gr 2 placenta, UA Doppler RI-0.57 & 0.60, FHR- 147 bpm

## 2013-12-28 NOTE — Discharge Instructions (Signed)
Braxton Hicks Contractions Pregnancy is commonly associated with contractions of the uterus throughout the pregnancy. Towards the end of pregnancy (32 to 34 weeks), these contractions [facility](Braxton [facility]) can develop more often and may become more forceful. This is not true labor because these contractions do not result in opening (dilatation) and thinning of the cervix. They are sometimes difficult to tell apart from true labor because these contractions can be forceful and people have different pain tolerances. You should not feel embarrassed if you go to the hospital with false labor. Sometimes, the only way to tell if you are in true labor is for your caregiver to follow the changes in the cervix. How to tell the difference between true and false labor:  False labor.  The contractions of false labor are usually shorter, irregular and not as hard as those of true labor.  They are often felt in the front of the lower abdomen and in the groin.  They may leave with walking around or changing positions while lying down.  They get weaker and are shorter lasting as time goes on.  These contractions are usually irregular.  They do not usually become progressively stronger, regular and closer together as with true labor.  True labor.  Contractions in true labor last 30 to 70 seconds, become very regular, usually become more intense, and increase in frequency.  They do not go away with walking.  The discomfort is usually felt in the top of the uterus and spreads to the lower abdomen and low back.  True labor can be determined by your caregiver with an exam. This will show that the cervix is dilating and getting thinner. If there are no prenatal problems or other health problems associated with the pregnancy, it is completely safe to be sent home with false labor and await the onset of true labor. HOME CARE INSTRUCTIONS   Keep up with your usual exercises and instructions.  Take medications as  directed.  Keep your regular prenatal appointment.  Eat and drink lightly if you think you are going into labor.  If BH contractions are making you uncomfortable:  Change your activity position from lying down or resting to walking/walking to resting.  Sit and rest in a tub of warm water.  Drink 2 to 3 glasses of water. Dehydration may cause B-H contractions.  Do slow and deep breathing several times an hour. SEEK IMMEDIATE MEDICAL CARE IF:   Your contractions continue to become stronger, more regular, and closer together.  You have a gushing, burst or leaking of fluid from the vagina.  An oral temperature above 102 F (38.9 C) develops.  You develop vaginal bleeding.  You develop continuous belly (abdominal) pain.  You have low back pain that you never had before.  You feel the baby's head pushing down causing pelvic pressure.  The baby is not moving as much as it used to. Document Released: 12/09/2005 Document Revised: 03/02/2012 Document Reviewed: 06/02/2009 [facility] Patient Information 2014 [employer] [place] [place].

## 2013-12-28 NOTE — Progress Notes (Signed)
Reports good fm. Denies lof, vb, urinary frequency, urgency, hesitancy, or dysuria.  Regular q 3min uc's, [facility], was still 2cm and was d/c'd. Occ HA r/b apap, occ seeing spots- not daily, denies ruq/epigastric pain. Some nausea. DTRs 2+, no clonus, no edema. Reviewing labs, she did not have 2nd IT- her NT u/s was normal. She also missed a few appts where her PN2 was scheduled, and it was never rescheduled after that.  She had an elevated P:C ratio on 12/30 that was contaminated w/ blood, so will repeat today. Reviewed ptl s/s, pre-e s/s, fkc.  All questions answered. F/U in 3d for PN2, nst, and visit.

## 2013-12-28 NOTE — MAU Note (Signed)
Contractions, pressure x 2 hours. Gush of water yesterday at 3 pm

## 2013-12-29 ENCOUNTER — Encounter: Payer: Self-pay | Admitting: Women's Health

## 2013-12-29 LAB — PROTEIN / CREATININE RATIO, URINE
Creatinine, Urine: 50.6 mg/dL
Protein Creatinine Ratio: 0.12 (ref <0.15)
Total Protein, Urine: 6 mg/dL

## 2013-12-31 ENCOUNTER — Encounter: Payer: Self-pay | Admitting: Obstetrics & Gynecology

## 2013-12-31 ENCOUNTER — Ambulatory Visit (INDEPENDENT_AMBULATORY_CARE_PROVIDER_SITE_OTHER): Payer: Medicaid Other | Admitting: Obstetrics & Gynecology

## 2013-12-31 ENCOUNTER — Other Ambulatory Visit: Payer: Medicaid Other

## 2013-12-31 VITALS — BP 140/90 | Temp 98.0°F | Wt 189.0 lb

## 2013-12-31 DIAGNOSIS — Z3483 Encounter for supervision of other normal pregnancy, third trimester: Secondary | ICD-10-CM

## 2013-12-31 DIAGNOSIS — Z1389 Encounter for screening for other disorder: Secondary | ICD-10-CM

## 2013-12-31 DIAGNOSIS — O139 Gestational [pregnancy-induced] hypertension without significant proteinuria, unspecified trimester: Secondary | ICD-10-CM

## 2013-12-31 DIAGNOSIS — O133 Gestational [pregnancy-induced] hypertension without significant proteinuria, third trimester: Secondary | ICD-10-CM

## 2013-12-31 DIAGNOSIS — Z331 Pregnant state, incidental: Secondary | ICD-10-CM

## 2013-12-31 DIAGNOSIS — O289 Unspecified abnormal findings on antenatal screening of mother: Secondary | ICD-10-CM

## 2013-12-31 DIAGNOSIS — O0993 Supervision of high risk pregnancy, unspecified, third trimester: Secondary | ICD-10-CM

## 2013-12-31 DIAGNOSIS — O99019 Anemia complicating pregnancy, unspecified trimester: Secondary | ICD-10-CM

## 2013-12-31 LAB — POCT URINALYSIS DIPSTICK
Blood, UA: NEGATIVE
Glucose, UA: NEGATIVE
Ketones, UA: NEGATIVE
Nitrite, UA: NEGATIVE
PROTEIN UA: NEGATIVE

## 2013-12-31 LAB — CBC
HEMATOCRIT: 30 % — AB (ref 36.0–46.0)
HEMOGLOBIN: 10.1 g/dL — AB (ref 12.0–15.0)
MCH: 26.3 pg (ref 26.0–34.0)
MCHC: 33.7 g/dL (ref 30.0–36.0)
MCV: 78.1 fL (ref 78.0–100.0)
Platelets: 238 10*3/uL (ref 150–400)
RBC: 3.84 MIL/uL — ABNORMAL LOW (ref 3.87–5.11)
RDW: 17.2 % — ABNORMAL HIGH (ref 11.5–15.5)
WBC: 7.9 10*3/uL (ref 4.0–10.5)

## 2013-12-31 MED ORDER — METHYLDOPA 500 MG PO TABS: 500.0000 mg | ORAL_TABLET | Freq: Three times a day (TID) | ORAL | Status: DC

## 2013-12-31 NOTE — Addendum Note (Signed)
Addended by: Colen DarlingYOUNG, Farhan Jean S on: 12/31/2013 10:28 AM   Modules accepted: Orders

## 2013-12-31 NOTE — Progress Notes (Signed)
BP creeping up still with time off of her procardia which we started originally for her preterm labor, will add aldomet 500 BID for diagnosis of gestational hypertension, since the procardia was stopped Protein negative Still plan on induction at 37 weeks BP weight and urine results all reviewed and noted. Patient reports good fetal movement, denies any bleeding and no rupture of membranes symptoms or regular contractions. Patient is without complaints. All questions were answered.

## 2014-01-01 LAB — HIV ANTIBODY (ROUTINE TESTING W REFLEX): HIV: NONREACTIVE

## 2014-01-01 LAB — GLUCOSE, RANDOM: GLUCOSE: 95 mg/dL (ref 70–99)

## 2014-01-01 LAB — RPR

## 2014-01-01 LAB — ANTIBODY SCREEN: ANTIBODY SCREEN: NEGATIVE

## 2014-01-03 ENCOUNTER — Other Ambulatory Visit: Payer: Self-pay | Admitting: Obstetrics & Gynecology

## 2014-01-03 DIAGNOSIS — O133 Gestational [pregnancy-induced] hypertension without significant proteinuria, third trimester: Secondary | ICD-10-CM

## 2014-01-03 LAB — HSV 2 ANTIBODY, IGG: HSV 2 Glycoprotein G Ab, IgG: <0.1 IV

## 2014-01-04 ENCOUNTER — Ambulatory Visit (INDEPENDENT_AMBULATORY_CARE_PROVIDER_SITE_OTHER): Payer: Medicaid Other

## 2014-01-04 ENCOUNTER — Ambulatory Visit (INDEPENDENT_AMBULATORY_CARE_PROVIDER_SITE_OTHER): Payer: Medicaid Other | Admitting: Advanced Practice Midwife

## 2014-01-04 ENCOUNTER — Other Ambulatory Visit: Payer: Self-pay | Admitting: Obstetrics & Gynecology

## 2014-01-04 VITALS — BP 140/98 | Wt 189.0 lb

## 2014-01-04 DIAGNOSIS — O99019 Anemia complicating pregnancy, unspecified trimester: Secondary | ICD-10-CM

## 2014-01-04 DIAGNOSIS — O133 Gestational [pregnancy-induced] hypertension without significant proteinuria, third trimester: Secondary | ICD-10-CM

## 2014-01-04 DIAGNOSIS — O289 Unspecified abnormal findings on antenatal screening of mother: Secondary | ICD-10-CM

## 2014-01-04 DIAGNOSIS — O139 Gestational [pregnancy-induced] hypertension without significant proteinuria, unspecified trimester: Secondary | ICD-10-CM

## 2014-01-04 DIAGNOSIS — Z331 Pregnant state, incidental: Secondary | ICD-10-CM

## 2014-01-04 DIAGNOSIS — Z1389 Encounter for screening for other disorder: Secondary | ICD-10-CM

## 2014-01-04 LAB — POCT URINALYSIS DIPSTICK
Glucose, UA: NEGATIVE
Ketones, UA: NEGATIVE
Nitrite, UA: NEGATIVE
Protein, UA: NEGATIVE
RBC UA: NEGATIVE

## 2014-01-04 NOTE — Progress Notes (Signed)
Had u/s today.  Mild HA, no visual changes, RUQ pain. I OL scheduled for 01/09/14 at 8am for GHTN.

## 2014-01-04 NOTE — Progress Notes (Signed)
U/S(36+5wks)-vtx active fetus, EFW 7 lb 1 oz (66th%tile), fluid wnl, AFI-13.4cm, anterior Gr2 placenta, BPP 8/8, UA Doppler RI-0.54 & 0.49, FHR-153 bpm

## 2014-01-04 NOTE — Patient Instructions (Signed)
If you are still pregnant on 01/09/14 at 8 am, come to [facility] ([address] [place] to [place] your [place]!  Eat a light meal before you come.  Daisey MustGood Luck!!

## 2014-01-05 ENCOUNTER — Telehealth (HOSPITAL_COMMUNITY): Payer: Self-pay | Admitting: *Deleted

## 2014-01-05 LAB — GC/CHLAMYDIA PROBE AMP
CT PROBE, AMP APTIMA: NEGATIVE
GC Probe RNA: NEGATIVE

## 2014-01-05 LAB — OB RESULTS CONSOLE GC/CHLAMYDIA
CHLAMYDIA, DNA PROBE: NEGATIVE
Gonorrhea: NEGATIVE

## 2014-01-05 NOTE — Telephone Encounter (Signed)
Preadmission screen  

## 2014-01-06 LAB — STREP B DNA PROBE: GBSP: NEGATIVE

## 2014-01-07 ENCOUNTER — Ambulatory Visit (INDEPENDENT_AMBULATORY_CARE_PROVIDER_SITE_OTHER): Payer: Medicaid Other | Admitting: Obstetrics and Gynecology

## 2014-01-07 ENCOUNTER — Encounter: Payer: Self-pay | Admitting: Obstetrics and Gynecology

## 2014-01-07 VITALS — BP 120/80 | Wt 194.0 lb

## 2014-01-07 DIAGNOSIS — O289 Unspecified abnormal findings on antenatal screening of mother: Secondary | ICD-10-CM

## 2014-01-07 DIAGNOSIS — Z1389 Encounter for screening for other disorder: Secondary | ICD-10-CM

## 2014-01-07 DIAGNOSIS — O99320 Drug use complicating pregnancy, unspecified trimester: Secondary | ICD-10-CM

## 2014-01-07 DIAGNOSIS — O99019 Anemia complicating pregnancy, unspecified trimester: Secondary | ICD-10-CM

## 2014-01-07 DIAGNOSIS — F1920 Other psychoactive substance dependence, uncomplicated: Secondary | ICD-10-CM

## 2014-01-07 DIAGNOSIS — Z331 Pregnant state, incidental: Secondary | ICD-10-CM

## 2014-01-07 LAB — POCT URINALYSIS DIPSTICK
GLUCOSE UA: NEGATIVE
Ketones, UA: NEGATIVE
Leukocytes, UA: NEGATIVE
Nitrite, UA: NEGATIVE
Protein, UA: NEGATIVE
RBC UA: NEGATIVE

## 2014-01-07 NOTE — Progress Notes (Signed)
19100w5d. NST done for chronic gest HTN. Reduced fetal movement since yesterday, states able to sleep through the night. Fetus responsive to stimulation.

## 2014-01-07 NOTE — Patient Instructions (Signed)
coontinue with plans for Induction Sunday as discussed with DrEure.

## 2014-01-09 ENCOUNTER — Inpatient Hospital Stay (HOSPITAL_COMMUNITY)
Admission: AD | Admit: 2014-01-09 | Discharge: 2014-01-12 | DRG: 775 | Disposition: A | Discharge status: Home / Self Care | Payer: Medicaid Other | Source: Ambulatory Visit | Attending: Family Medicine | Admitting: Family Medicine

## 2014-01-09 ENCOUNTER — Encounter (HOSPITAL_COMMUNITY): Payer: Medicaid Other | Admitting: Anesthesiology

## 2014-01-09 ENCOUNTER — Inpatient Hospital Stay (HOSPITAL_COMMUNITY): Admission: RE | Admit: 2014-01-09 | Payer: Medicaid Other | Source: Ambulatory Visit

## 2014-01-09 ENCOUNTER — Encounter (HOSPITAL_COMMUNITY): Payer: Self-pay | Admitting: *Deleted

## 2014-01-09 ENCOUNTER — Inpatient Hospital Stay (HOSPITAL_COMMUNITY): Payer: Medicaid Other | Admitting: Anesthesiology

## 2014-01-09 DIAGNOSIS — O09293 Supervision of pregnancy with other poor reproductive or obstetric history, third trimester: Secondary | ICD-10-CM | POA: Diagnosis present

## 2014-01-09 DIAGNOSIS — O4700 False labor before 37 completed weeks of gestation, unspecified trimester: Secondary | ICD-10-CM

## 2014-01-09 DIAGNOSIS — O0990 Supervision of high risk pregnancy, unspecified, unspecified trimester: Secondary | ICD-10-CM

## 2014-01-09 DIAGNOSIS — O479 False labor, unspecified: Secondary | ICD-10-CM

## 2014-01-09 DIAGNOSIS — O2341 Unspecified infection of urinary tract in pregnancy, first trimester: Secondary | ICD-10-CM

## 2014-01-09 DIAGNOSIS — O139 Gestational [pregnancy-induced] hypertension without significant proteinuria, unspecified trimester: Principal | ICD-10-CM | POA: Diagnosis present

## 2014-01-09 DIAGNOSIS — O133 Gestational [pregnancy-induced] hypertension without significant proteinuria, third trimester: Secondary | ICD-10-CM

## 2014-01-09 HISTORY — DX: Supervision of pregnancy with other poor reproductive or obstetric history, third trimester: O09.293

## 2014-01-09 LAB — COMPREHENSIVE METABOLIC PANEL
ALBUMIN: 2.3 g/dL — AB (ref 3.5–5.2)
ALT: 17 U/L (ref 0–35)
AST: 23 U/L (ref 0–37)
Alkaline Phosphatase: 94 U/L (ref 39–117)
BUN: 4 mg/dL — ABNORMAL LOW (ref 6–23)
CALCIUM: 9.6 mg/dL (ref 8.4–10.5)
CHLORIDE: 103 meq/L (ref 96–112)
CO2: 22 mEq/L (ref 19–32)
CREATININE: 0.7 mg/dL (ref 0.50–1.10)
GFR calc Af Amer: >90 mL/min (ref >90)
GFR calc non Af Amer: >90 mL/min (ref >90)
Glucose, Bld: 99 mg/dL (ref 70–99)
Potassium: 3.7 mEq/L (ref 3.7–5.3)
Sodium: 140 mEq/L (ref 137–147)
TOTAL PROTEIN: 5.7 g/dL — AB (ref 6.0–8.3)

## 2014-01-09 LAB — CBC
HCT: 29.1 % — ABNORMAL LOW (ref 36.0–46.0)
HCT: 27 % — ABNORMAL LOW (ref 36.0–46.0)
Hemoglobin: 9.5 g/dL — ABNORMAL LOW (ref 12.0–15.0)
Hemoglobin: 8.9 g/dL — ABNORMAL LOW (ref 12.0–15.0)
MCH: 26.5 pg (ref 26.0–34.0)
MCH: 26.9 pg (ref 26.0–34.0)
MCHC: 32.6 g/dL (ref 30.0–36.0)
MCHC: 33 g/dL (ref 30.0–36.0)
MCV: 81.1 fL (ref 78.0–100.0)
MCV: 81.6 fL (ref 78.0–100.0)
PLATELETS: 217 10*3/uL (ref 150–400)
PLATELETS: 191 10*3/uL (ref 150–400)
RBC: 3.59 MIL/uL — AB (ref 3.87–5.11)
RBC: 3.31 MIL/uL — AB (ref 3.87–5.11)
RDW: 16 % — AB (ref 11.5–15.5)
RDW: 16.1 % — ABNORMAL HIGH (ref 11.5–15.5)
WBC: 8.2 10*3/uL (ref 4.0–10.5)
WBC: 9 10*3/uL (ref 4.0–10.5)

## 2014-01-09 LAB — RPR: RPR: NONREACTIVE

## 2014-01-09 LAB — TYPE AND SCREEN
ABO/RH(D): O POS
Antibody Screen: NEGATIVE

## 2014-01-09 LAB — PROTEIN / CREATININE RATIO, URINE
CREATININE, URINE: 36.66 mg/dL
PROTEIN CREATININE RATIO: 0.23 — AB (ref 0.00–0.15)
Total Protein, Urine: 8.4 mg/dL

## 2014-01-09 MED ORDER — LIDOCAINE HCL (PF) 1 % IJ SOLN
INTRAMUSCULAR | Status: DC | PRN
  Administered 2014-01-09 (×2): 5 mL

## 2014-01-09 MED ORDER — DIPHENHYDRAMINE HCL 50 MG/ML IJ SOLN: 12.5000 mg | INTRAMUSCULAR | Status: DC | PRN

## 2014-01-09 MED ORDER — LACTATED RINGERS IV SOLN
INTRAVENOUS | Status: DC
  Administered 2014-01-09: 15:00:00 via INTRAVENOUS

## 2014-01-09 MED ORDER — METHYLDOPA 500 MG PO TABS
500.0000 mg | ORAL_TABLET | Freq: Three times a day (TID) | ORAL | Status: DC
  Administered 2014-01-09: 500 mg via ORAL
  Filled 2014-01-09 (×4): qty 1

## 2014-01-09 MED ORDER — CITRIC ACID-SODIUM CITRATE 334-500 MG/5ML PO SOLN
ORAL | Status: AC
  Administered 2014-01-09: 30 mL
  Filled 2014-01-09: qty 15

## 2014-01-09 MED ORDER — LACTATED RINGERS IV SOLN: 500.0000 mL | INTRAVENOUS | Status: DC | PRN

## 2014-01-09 MED ORDER — PHENYLEPHRINE 40 MCG/ML (10ML) SYRINGE FOR IV PUSH (FOR BLOOD PRESSURE SUPPORT)
80.0000 ug | PREFILLED_SYRINGE | INTRAVENOUS | Status: DC | PRN
  Filled 2014-01-09: qty 2

## 2014-01-09 MED ORDER — TERBUTALINE SULFATE 1 MG/ML IJ SOLN: 0.2500 mg | Freq: Once | INTRAMUSCULAR | Status: AC | PRN

## 2014-01-09 MED ORDER — PROMETHAZINE HCL 25 MG PO TABS
25.0000 mg | ORAL_TABLET | Freq: Four times a day (QID) | ORAL | Status: DC | PRN
  Administered 2014-01-09: 25 mg via ORAL
  Filled 2014-01-09: qty 1

## 2014-01-09 MED ORDER — OXYTOCIN BOLUS FROM INFUSION: 500.0000 mL | INTRAVENOUS | Status: DC

## 2014-01-09 MED ORDER — ONDANSETRON HCL 4 MG/2ML IJ SOLN
4.0000 mg | Freq: Four times a day (QID) | INTRAMUSCULAR | Status: DC | PRN
  Administered 2014-01-09: 4 mg via INTRAVENOUS
  Filled 2014-01-09: qty 2

## 2014-01-09 MED ORDER — FENTANYL 2.5 MCG/ML BUPIVACAINE 1/10 % EPIDURAL INFUSION (WH - ANES)
14.0000 mL/h | INTRAMUSCULAR | Status: DC | PRN
  Administered 2014-01-09 – 2014-01-10 (×4): 14 mL/h via EPIDURAL
  Filled 2014-01-09 (×4): qty 125

## 2014-01-09 MED ORDER — EPHEDRINE 5 MG/ML INJ
10.0000 mg | INTRAVENOUS | Status: DC | PRN
  Filled 2014-01-09: qty 2

## 2014-01-09 MED ORDER — OXYTOCIN 40 UNITS IN LACTATED RINGERS INFUSION - SIMPLE MED
1.0000 m[IU]/min | INTRAVENOUS | Status: DC
  Administered 2014-01-09: 2 m[IU]/min via INTRAVENOUS
  Filled 2014-01-09: qty 1000

## 2014-01-09 MED ORDER — OXYTOCIN 40 UNITS IN LACTATED RINGERS INFUSION - SIMPLE MED: 62.5000 mL/h | INTRAVENOUS | Status: DC

## 2014-01-09 MED ORDER — IBUPROFEN 600 MG PO TABS
600.0000 mg | ORAL_TABLET | Freq: Four times a day (QID) | ORAL | Status: DC | PRN
  Administered 2014-01-10: 600 mg via ORAL
  Filled 2014-01-09: qty 1

## 2014-01-09 MED ORDER — CITRIC ACID-SODIUM CITRATE 334-500 MG/5ML PO SOLN: 30.0000 mL | ORAL | Status: DC | PRN

## 2014-01-09 MED ORDER — TERBUTALINE SULFATE 1 MG/ML IJ SOLN
0.2500 mg | Freq: Once | INTRAMUSCULAR | Status: AC | PRN
Start: 2014-01-09 — End: 2014-01-09

## 2014-01-09 MED ORDER — EPHEDRINE 5 MG/ML INJ
10.0000 mg | INTRAVENOUS | Status: DC | PRN
  Filled 2014-01-09: qty 2
  Filled 2014-01-09: qty 4

## 2014-01-09 MED ORDER — LACTATED RINGERS IV SOLN
500.0000 mL | Freq: Once | INTRAVENOUS | Status: AC
  Administered 2014-01-09: 500 mL via INTRAVENOUS

## 2014-01-09 MED ORDER — LIDOCAINE HCL (PF) 1 % IJ SOLN
30.0000 mL | INTRAMUSCULAR | Status: DC | PRN
  Filled 2014-01-09 (×2): qty 30

## 2014-01-09 MED ORDER — PHENYLEPHRINE 40 MCG/ML (10ML) SYRINGE FOR IV PUSH (FOR BLOOD PRESSURE SUPPORT)
80.0000 ug | PREFILLED_SYRINGE | INTRAVENOUS | Status: DC | PRN
  Filled 2014-01-09: qty 2
  Filled 2014-01-09: qty 10

## 2014-01-09 MED ORDER — FENTANYL CITRATE 0.05 MG/ML IJ SOLN
100.0000 ug | INTRAMUSCULAR | Status: DC | PRN
  Administered 2014-01-09 (×4): 100 ug via INTRAVENOUS
  Filled 2014-01-09 (×4): qty 2

## 2014-01-09 MED ORDER — PANTOPRAZOLE SODIUM 40 MG IV SOLR
40.0000 mg | Freq: Once | INTRAVENOUS | Status: AC
  Administered 2014-01-09: 40 mg via INTRAVENOUS
  Filled 2014-01-09: qty 40

## 2014-01-09 MED ORDER — OXYCODONE-ACETAMINOPHEN 5-325 MG PO TABS
1.0000 | ORAL_TABLET | ORAL | Status: DC | PRN
  Administered 2014-01-10: 2 via ORAL
  Filled 2014-01-09: qty 2

## 2014-01-09 MED ORDER — ACETAMINOPHEN 325 MG PO TABS: 650.0000 mg | ORAL_TABLET | ORAL | Status: DC | PRN

## 2014-01-09 NOTE — Progress Notes (Signed)
Patient ID: Melissa DaviesChastity L Johnston, female   DOB: 04/28/1981, 33 y.o.   MRN: [id number] Melissa PicketChastity L Vance Johnston is a 33 y.o. G4P3003 at 1516w0d by ultrasound admitted for induction of labor due to Pre Eclampsia.  Subjective: Patient is doing well, complains of increasing pain intensity.  Requests IV pain medication   Objective: BP 128/75  Pulse 79  Temp(Src) 98.6 F (37 C) (Oral)  Resp 18  Ht 5\' 3"  (1.6 m)  Wt 87.998 kg (194 lb)  BMI 34.37 kg/m2  LMP 04/10/2013      FHT:  FHR: 160 bpm, variability: moderate,  accelerations:  Present,  decelerations:  Absent UC:   irregular SVE:   Dilation: 2 Station: -2 Exam by:: odum  Labs: Lab Results  Component Value Date   WBC 8.2 01/09/2014   HGB 9.5* 01/09/2014   HCT 29.1* 01/09/2014   MCV 81.1 01/09/2014   PLT 217 01/09/2014    Assessment / Plan: Induction of Labor due to preeclampsia, FB in  Labor: Progressing normally continue to monitor fB then augment with pit Preeclampsia:  no signs or symptoms of toxicity Fetal Wellbeing:  Category I Pain Control:  Fentanyl I/D:  n/a Anticipated MOD:  NSVD  [name] 01/09/2014, 3:24 PM  I spoke with and examined patient and agree with Medical students note and plan of care.  Tawana ScaleMichael Ryan Laiba Fuerte, MD OB Fellow 01/09/2014 3:35 PM

## 2014-01-09 NOTE — Progress Notes (Signed)
[name] is a 33 y.o. G4P3003 at 6545w0d by admitted for induction of labor due to Hypertension.  Subjective: Feeling much more comfortable with epidural, feels like contractions are getting stronger  Objective: BP 129/84  Pulse 81  Temp(Src) 98.7 F (37.1 C) (Oral)  Resp 18  Ht 5\' 3"  (1.6 m)  Wt 87.998 kg (194 lb)  BMI 34.37 kg/m2  LMP 04/10/2013     FHT:  FHR: 145 bpm, variability: moderate,  accelerations:  Present,  decelerations:  Absent UC:   regular, every 5-7 minutes SVE:   Dilation: 6 Effacement (%): 50 Station: -3 Exam by:: DrAdomo  Labs: Lab Results  Component Value Date   WBC 9.0 01/09/2014   HGB 8.9* 01/09/2014   HCT 27.0* 01/09/2014   MCV 81.6 01/09/2014   PLT 191 01/09/2014    Assessment / Plan: Induction of labor due to gestational hypertension,  progressing well on pitocin  Labor: Progressing on Pitocin, will continue to increase then AROM Preeclampsia:  no signs or symptoms of toxicity, normotensive since this morning, no severe range pressures Fetal Wellbeing:  Category I Pain Control:  Epidural I/D:  n/a Anticipated MOD:  NSVD  Beverely Lowdamo, Elena 01/09/2014, 9:50 PM  I have seen and examined this patient and agree with above documentation in the resident's note.   Rulon AbideKeli Yakelin Grenier, M.D. [facility] 01/09/2014 9:54 PM

## 2014-01-09 NOTE — Anesthesia Preprocedure Evaluation (Signed)
Anesthesia Evaluation  Patient identified by MRN, date of birth, ID band Patient awake    Reviewed: Allergy & Precautions, H&P , Patient's Chart, lab work & pertinent test results  Airway Mallampati: III TM Distance: >3 FB Neck ROM: full    Dental   Pulmonary  breath sounds clear to auscultation        Cardiovascular hypertension, Rhythm:regular Rate:Normal     Neuro/Psych    GI/Hepatic   Endo/Other    Renal/GU      Musculoskeletal   Abdominal   Peds  Hematology   Anesthesia Other Findings   Reproductive/Obstetrics (+) Pregnancy                           Anesthesia Physical Anesthesia Plan  ASA: III  Anesthesia Plan: Epidural   Post-op Pain Management:    Induction:   Airway Management Planned:   Additional Equipment:   Intra-op Plan:   Post-operative Plan:   Informed Consent: I have reviewed the patients History and Physical, chart, labs and discussed the procedure including the risks, benefits and alternatives for the proposed anesthesia with the patient or authorized representative who has indicated his/her understanding and acceptance.     Plan Discussed with:   Anesthesia Plan Comments:         Anesthesia Quick Evaluation  

## 2014-01-09 NOTE — Progress Notes (Signed)
Patient ID: Melissa DaviesChastity L Johnston, female   DOB: 08/02/1981, 33 y.o.   MRN: [id number] Melissa PicketChastity L Vance Johnston is a 33 y.o. G4P3003 at 3080w0d by ultrasound admitted for induction of labor due to Preeclampsia.  Subjective: Pt is doing well, reports pain as tolerable on Current meds No headache, blurry vision, SOB or Upper abdominal Pain   Objective: BP 128/75  Pulse 79  Temp(Src) 98.6 F (37 C) (Oral)  Resp 18  Ht 5\' 3"  (1.6 m)  Wt 87.998 kg (194 lb)  BMI 34.37 kg/m2  LMP 04/10/2013    Filed Vitals:   01/09/14 1130 01/09/14 1249 01/09/14 1526 01/09/14 1755  BP: 139/84 128/75 130/70 134/70  Pulse: 80 79 80 85  Temp:  98.6 F (37 C)  98.4 F (36.9 C)  TempSrc:  Oral    Resp:  18  16  Height:      Weight:           FHT:  FHR: 155 bpm, variability: moderate,  accelerations:  Present,  decelerations:  Absent UC:   regular, every 2-3 minutes SVE:   Dilation: 2 Station: -2 Exam by:: odum  Labs: Lab Results  Component Value Date   WBC 8.2 01/09/2014   HGB 9.5* 01/09/2014   HCT 29.1* 01/09/2014   MCV 81.1 01/09/2014   PLT 217 01/09/2014   CMP     Component Value Date/Time   NA 140 01/09/2014 0755   K 3.7 01/09/2014 0755   CL 103 01/09/2014 0755   CO2 22 01/09/2014 0755   GLUCOSE 99 01/09/2014 0755   BUN 4* 01/09/2014 0755   CREATININE 0.70 01/09/2014 0755   CALCIUM 9.6 01/09/2014 0755   PROT 5.7* 01/09/2014 0755   ALBUMIN 2.3* 01/09/2014 0755   AST 23 01/09/2014 0755   ALT 17 01/09/2014 0755   ALKPHOS 94 01/09/2014 0755   BILITOT <0.2* 01/09/2014 0755   GFRNONAA >90 01/09/2014 0755   GFRAA >90 01/09/2014 0755     Assessment / Plan: Induction of labor due to preeclampsia, Foley's bulb still in  Labor: no significant change, will continue to monitor FB and start 342mU/min of pitocin Preeclampsia:  no signs or symptoms of toxicity Fetal Wellbeing:  Category I Pain Control:  Fentanyl I/D:  n/a Anticipated MOD:  NSVD  Muazu, Aisha 01/09/2014, 5:34 PM  I spoke with and  examined patient and agree with medical student's note and plan of care.  Tawana ScaleMichael Ryan Rayya Yagi, MD Ob Fellow 01/09/2014 6:02 PM

## 2014-01-09 NOTE — H&P (Signed)
[name] is a 33 y.o. female [phone] with IUP at [redacted]w[redacted]d presenting for IOL for GHTN. Pt has also been having severe acid reflux with cough and vomiting. Pt has a mild headache this morning after vomiting. Pt reports mild and typical for her emesis. Pt reports no visual distubances, no RUQ pain. Normal fetal movement, no LOF, no vb, occassional ctx.  PNCare at [facility] since 6 wks  Prenatal History/Complications: GHTN without proteniuria on aldomet 500mg  TID, rec'd BMZ and was on procardia for PTL,   Past Medical History: Past Medical History  Diagnosis Date  . Pregnant   . Kidney stones     Past Surgical History: Past Surgical History  Procedure Laterality Date  . Kidney stents    . Lithotripsy    . Appendectomy      Obstetrical History: OB History   Grav Para Term Preterm Abortions TAB SAB Ect Mult Living   4 3 3       3       Gynecological History: OB History   Grav Para Term Preterm Abortions TAB SAB Ect Mult Living   4 3 3       3       Social History: History   Social History  . Marital Status: Married    Spouse Name: N/A    Number of Children: N/A  . Years of Education: N/A   Social History Main Topics  . Smoking status: Never Smoker   . Smokeless tobacco: Never Used  . Alcohol Use: No  . Drug Use: No  . Sexual Activity: Yes    Birth Control/ Protection: None   Other Topics Concern  . None   Social History Narrative  . None    Family History: Family History  Problem Relation Age of Onset  . Cancer Mother     breast  . Diabetes Maternal Grandmother     Allergies: No Known Allergies  Prescriptions prior to admission  Medication Sig Dispense Refill  . acetaminophen (TYLENOL) 500 MG tablet Take 1,000 mg by mouth every 6 (six) hours as needed for mild pain or moderate pain.       . butalbital-acetaminophen-caffeine (FIORICET) 50-325-40 MG per tablet Take 1-2 tablets by mouth every 6 (six) hours as needed for headache.  20 tablet  0  .  calcium carbonate (TUMS - DOSED IN MG ELEMENTAL CALCIUM) 500 MG chewable tablet Chew 2 tablets by mouth See admin instructions. Patient takes 2-3 chewables very 10-15 minutes.      . methyldopa (ALDOMET) 500 MG tablet Take 1 tablet (500 mg total) by mouth 3 (three) times daily.  60 tablet  1  . omeprazole (PRILOSEC) 20 MG capsule Take 1 capsule (20 mg total) by mouth daily.  30 capsule  6  . Prenatal Vit-Fe Fumarate-FA (PRENATAL MULTIVITAMIN) TABS tablet Take 1 tablet by mouth daily at 12 noon.      . promethazine (PHENERGAN) 25 MG suppository Place 1 suppository (25 mg total) rectally every 6 (six) hours as needed for nausea or vomiting.  12 each  1  . zolpidem (AMBIEN) 5 MG tablet Take 1 tablet (5 mg total) by mouth at bedtime as needed for sleep.  30 tablet  0     Review of Systems  As above. Otherwise no complaints. No f/c, sob, +n/v, no d/c, no abd pain, no weakness, urinary symptoms.  Last menstrual period 04/10/2013. General appearance: alert, cooperative, appears stated age and no distress Lungs: clear to auscultation bilaterally Heart: regular  rate and rhythm Abdomen: soft, non-tender; bowel sounds normal Pelvic: adequate Extremities: Homans sign is negative, no sign of DVT DTR's 2+ Presentation: cephalic Fetal monitoringBaseline: 140s bpm, Variability: Good {> 6 bpm), Accelerations: Reactive and Decelerations: Absent Uterine activityNone   Dilation: 2 Station: -2 Presentation: Vertex Exam by:: odum   Prenatal labs: ABO, Rh: --/--/O POS, O POS (09/24 1600) Antibody: NEG (01/09 0922) Rubella:   RPR: NON REAC (01/09 0922)  HBsAg: NEGATIVE (06/16 1203)  HIV: NON REACTIVE (01/09 0922)  GBS: NEGATIVE (01/13 1237)    Prenatal Transfer Tool  Maternal Diabetes: Unknown nml size infant - did not receive 2hr Genetic Screening: NT was normal Maternal Ultrasounds/Referrals: Normal Fetal Ultrasounds or other Referrals:  None Maternal Substance Abuse:  No Significant Maternal  Medications:  Meds include: Other:  aldomet 500mg  TID Significant Maternal Lab Results: Lab values include: Group B Strep negative     [facility]  Pap neg  Genetic Screen NT/IT: never had 2nd IT, NT u/s was normal  Anatomic US 4.8 mm hydronephrosis bilat @ 20 wk,other nl female, recheck @ 36wks:  Glucose Screen 2hr: missed pn2 visits and wasn't rescheduled, scheduled for 12/31/13:  Flu vaccine Oct 2014  CF Screen neg  GC/CT Initial: -/-      36+wks:  GBS Neg 12/7 @ 29wks; repeat at 36 wks  Feed Preference bottle  Contraception  nexplanon  Circumcision Yes, at [facility]  Childbirth Classes no  Pediatrician TMPA     No results found for this or any previous visit (from the past 24 hour(s)).  Assessment: [name] is a 33 y.o. [id number] at 6242w0d here for IOL for GHTN #Labor: IOL for GHTN. Placed FB for initiation of labor. Will move to pit once out. #Pain: Desires IV pain meds and epidural #FWB: Cat I #ID:  GBS Neg #MOF: Bottle #MOC: Nexplanon #Circ:  At [facility] #GHTN: Continue aldomet, repeat labs at this time.   [name] 01/09/2014, 8:00 AM

## 2014-01-09 NOTE — Anesthesia Procedure Notes (Signed)
Epidural Patient location during procedure: OB Start time: 01/09/2014 9:13 PM  Staffing Anesthesiologist: Brayton CavesJACKSON, Chong January Performed by: anesthesiologist   Preanesthetic Checklist Completed: patient identified, site marked, surgical consent, pre-op evaluation, timeout performed, IV checked, risks and benefits discussed and monitors and equipment checked  Epidural Patient position: sitting Prep: site prepped and draped and DuraPrep Patient monitoring: continuous pulse ox and blood pressure Approach: midline Injection technique: LOR air  Needle:  Needle type: Tuohy  Needle gauge: 17 G Needle length: 9 cm and 9 Needle insertion depth: 7 cm Catheter type: closed end flexible Catheter size: 19 Gauge Catheter at skin depth: 12 cm Test dose: negative  Assessment Events: blood not aspirated, injection not painful, no injection resistance, negative IV test and no paresthesia  Additional Notes Patient identified.  Risk benefits discussed including failed block, incomplete pain control, headache, nerve damage, paralysis, blood pressure changes, nausea, vomiting, reactions to medication both toxic or allergic, and postpartum back pain.  Patient expressed understanding and wished to proceed.  All questions were answered.  Sterile technique used throughout procedure and epidural site dressed with sterile barrier dressing. No paresthesia or other complications noted.The patient did not experience any signs of intravascular injection such as tinnitus or metallic taste in mouth nor signs of intrathecal spread such as rapid motor block. Please see nursing notes for vital signs.

## 2014-01-10 ENCOUNTER — Encounter (HOSPITAL_COMMUNITY): Payer: Self-pay | Admitting: *Deleted

## 2014-01-10 DIAGNOSIS — O139 Gestational [pregnancy-induced] hypertension without significant proteinuria, unspecified trimester: Secondary | ICD-10-CM

## 2014-01-10 LAB — CBC
HEMATOCRIT: 26.1 % — AB (ref 36.0–46.0)
Hemoglobin: 8.5 g/dL — ABNORMAL LOW (ref 12.0–15.0)
MCH: 26.6 pg (ref 26.0–34.0)
MCHC: 32.6 g/dL (ref 30.0–36.0)
MCV: 81.6 fL (ref 78.0–100.0)
Platelets: 186 10*3/uL (ref 150–400)
RBC: 3.2 MIL/uL — ABNORMAL LOW (ref 3.87–5.11)
RDW: 16 % — ABNORMAL HIGH (ref 11.5–15.5)
WBC: 10.8 10*3/uL — AB (ref 4.0–10.5)

## 2014-01-10 MED ORDER — ONDANSETRON HCL 4 MG PO TABS
4.0000 mg | ORAL_TABLET | ORAL | Status: DC | PRN
  Administered 2014-01-11: 4 mg via ORAL
  Filled 2014-01-10: qty 1

## 2014-01-10 MED ORDER — LANOLIN HYDROUS EX OINT: TOPICAL_OINTMENT | CUTANEOUS | Status: DC | PRN

## 2014-01-10 MED ORDER — BUPIVACAINE HCL (PF) 0.25 % IJ SOLN
INTRAMUSCULAR | Status: DC | PRN
  Administered 2014-01-10: 10 mL

## 2014-01-10 MED ORDER — WITCH HAZEL-GLYCERIN EX PADS: 1.0000 "application " | MEDICATED_PAD | CUTANEOUS | Status: DC | PRN

## 2014-01-10 MED ORDER — ONDANSETRON HCL 4 MG/2ML IJ SOLN: 4.0000 mg | INTRAMUSCULAR | Status: DC | PRN

## 2014-01-10 MED ORDER — PRENATAL MULTIVITAMIN CH
1.0000 | ORAL_TABLET | Freq: Every day | ORAL | Status: DC
  Administered 2014-01-10 – 2014-01-12 (×3): 1 via ORAL
  Filled 2014-01-10 (×3): qty 1

## 2014-01-10 MED ORDER — BENZOCAINE-MENTHOL 20-0.5 % EX AERO: 1.0000 "application " | INHALATION_SPRAY | CUTANEOUS | Status: DC | PRN

## 2014-01-10 MED ORDER — DIBUCAINE 1 % RE OINT: 1.0000 "application " | TOPICAL_OINTMENT | RECTAL | Status: DC | PRN

## 2014-01-10 MED ORDER — SIMETHICONE 80 MG PO CHEW: 80.0000 mg | CHEWABLE_TABLET | ORAL | Status: DC | PRN

## 2014-01-10 MED ORDER — DIPHENHYDRAMINE HCL 25 MG PO CAPS
25.0000 mg | ORAL_CAPSULE | Freq: Four times a day (QID) | ORAL | Status: DC | PRN
Start: 2014-01-10 — End: 2014-01-12

## 2014-01-10 MED ORDER — IBUPROFEN 600 MG PO TABS
600.0000 mg | ORAL_TABLET | Freq: Four times a day (QID) | ORAL | Status: DC
  Administered 2014-01-10 – 2014-01-12 (×8): 600 mg via ORAL
  Filled 2014-01-10 (×8): qty 1

## 2014-01-10 MED ORDER — SENNOSIDES-DOCUSATE SODIUM 8.6-50 MG PO TABS
2.0000 | ORAL_TABLET | ORAL | Status: DC
  Administered 2014-01-11 – 2014-01-12 (×2): 2 via ORAL
  Filled 2014-01-10 (×2): qty 2

## 2014-01-10 MED ORDER — TETANUS-DIPHTH-ACELL PERTUSSIS 5-2.5-18.5 LF-MCG/0.5 IM SUSP: 0.5000 mL | Freq: Once | INTRAMUSCULAR | Status: DC

## 2014-01-10 MED ORDER — ZOLPIDEM TARTRATE 5 MG PO TABS
5.0000 mg | ORAL_TABLET | Freq: Every evening | ORAL | Status: DC | PRN
Start: 2014-01-10 — End: 2014-01-12

## 2014-01-10 MED ORDER — OXYCODONE-ACETAMINOPHEN 5-325 MG PO TABS
1.0000 | ORAL_TABLET | ORAL | Status: DC | PRN
  Administered 2014-01-10 – 2014-01-11 (×6): 2 via ORAL
  Administered 2014-01-11 – 2014-01-12 (×2): 1 via ORAL
  Administered 2014-01-12 (×2): 2 via ORAL
  Filled 2014-01-10 (×2): qty 1
  Filled 2014-01-10 (×8): qty 2

## 2014-01-10 NOTE — Progress Notes (Signed)
[name] is a 33 y.o. [id number] at 4966w1d  admitted for induction of labor due to gHTN.  Subjective:  Pt comfortable with epidural.  Wanting her water broken. +FM.   Objective: BP 136/84  Pulse 87  Temp(Src) 98.1 F (36.7 C) (Oral)  Resp 20  Ht 5\' 3"  (1.6 m)  Wt 87.998 kg (194 lb)  BMI 34.37 kg/m2  LMP 04/10/2013      FHT:  FHR: 145 bpm, variability: moderate,  accelerations:  Present,  decelerations:  Absent UC:   regular, every 2-3 minutes SVE:   Dilation: 6.5 Effacement (%): 60 Station: -2 Exam by:: Haidynn Almendarez  Labs: Lab Results  Component Value Date   WBC 9.0 01/09/2014   HGB 8.9* 01/09/2014   HCT 27.0* 01/09/2014   MCV 81.6 01/09/2014   PLT 191 01/09/2014    Assessment / Plan: Induction of labor due to gestational hypertension,  progressing well on pitocin  Labor: AROM performed with return of clear fluid gHTN:  normal pressures.  Fetal Wellbeing:  Category I Pain Control:  Epidural I/D:  n/a Anticipated MOD:  NSVD  Jahmarion Popoff L 01/10/2014, 1:28 AM

## 2014-01-10 NOTE — Anesthesia Postprocedure Evaluation (Signed)
  Anesthesia Post-op Note  Anesthesia Post Note  Patient: [name]  Procedure(s) Performed: * No procedures listed *  Anesthesia type: Epidural  Patient location: Mother/Baby  Post pain: Pain level controlled  Post assessment: Post-op Vital signs reviewed  Last Vitals:  Filed Vitals:   01/10/14 1215  BP: 130/74  Pulse: 81  Temp: 36.6 C  Resp: 16    Post vital signs: Reviewed  Level of consciousness:alert  Complications: No apparent anesthesia complications

## 2014-01-10 NOTE — Progress Notes (Signed)
[name] is a 33 y.o. [id number] at 7528w1d admitted for induction of labor due to Hypertension.  Subjective: Pushing, lots of pressure and right hip pain with contractions  Objective: BP 126/108  Pulse 117  Temp(Src) 98.7 F (37.1 C) (Oral)  Resp 20  Ht 5\' 3"  (1.6 m)  Wt 87.998 kg (194 lb)  BMI 34.37 kg/m2  LMP 04/10/2013      FHT:  FHR: 165 bpm, variability: moderate,  accelerations:  Present,  decelerations:  Present earlies with pushes UC:   regular, every 2 minutes SVE:   Dilation: 10 Effacement (%): 100 Station: 0 Exam by:: JDaley  Labs: Lab Results  Component Value Date   WBC 9.0 01/09/2014   HGB 8.9* 01/09/2014   HCT 27.0* 01/09/2014   MCV 81.6 01/09/2014   PLT 191 01/09/2014    Assessment / Plan: Induction for gHTN. Complete after pitocin all night. Pushed for 1 hour with no progress, will dose epidural and let her rest/labor down  Labor: Progressing normally Preeclampsia:  no signs or symptoms of toxicity Fetal Wellbeing:  Category II Pain Control:  Epidural I/D:  n/a Anticipated MOD:  NSVD  Beverely Lowdamo, Elena 01/10/2014, 6:15 AM  I have seen and examined this patient and agree with above documentation in the resident's note.   Rulon AbideKeli Ahna Konkle, M.D. [facility] 01/10/2014 6:33 AM

## 2014-01-11 NOTE — Progress Notes (Signed)
Post Partum Day 1 Subjective: no complaints, up ad lib, voiding, tolerating PO and + flatus  Objective: Blood pressure 128/91, pulse 87, temperature 98 F (36.7 C), temperature source Oral, resp. rate 17, height 5\' 3"  (1.6 m), weight 87.998 kg (194 lb), last menstrual period 04/10/2013, SpO2 98.00%, unknown if currently breastfeeding.  Physical Exam:  General: alert, cooperative, appears stated age and no distress Lochia: appropriate Uterine Fundus: firm DVT Evaluation: No evidence of DVT seen on physical exam. Negative Homan's sign. No cords or calf tenderness. No significant calf/ankle edema.   Recent Labs  01/09/14 2025 01/10/14 0956  HGB 8.9* 8.5*  HCT 27.0* 26.1*    Assessment/Plan: Plan for discharge tomorrow, MOC: mirena vs interval BTL, bottle   LOS: 2 days   Braylyn Kalter RYAN 01/11/2014, 11:48 AM

## 2014-01-11 NOTE — Progress Notes (Signed)
UR chart review completed.  

## 2014-01-12 MED ORDER — IBUPROFEN 600 MG PO TABS: 600.0000 mg | ORAL_TABLET | Freq: Four times a day (QID) | ORAL | Status: DC

## 2014-01-12 MED ORDER — ACETAMINOPHEN-CODEINE 300-30 MG PO TABS: 1.0000 | ORAL_TABLET | ORAL | Status: DC | PRN

## 2014-01-12 MED ORDER — INTEGRA F 125-1 MG PO CAPS: 1.0000 | ORAL_CAPSULE | Freq: Every day | ORAL | Status: DC

## 2014-01-12 NOTE — Discharge Summary (Signed)
Obstetric Discharge Summary Reason for Admission: induction of labor Prenatal Procedures: ultrasound Intrapartum Procedures: spontaneous vaginal delivery Postpartum Procedures: none Complications-Operative and Postpartum: 1st degree perineal laceration Hemoglobin  Date Value Range Status  01/10/2014 8.5* 12.0 - 15.0 g/dL Final     HCT  Date Value Range Status  01/10/2014 26.1* 36.0 - 46.0 % Final    Physical Exam:  Filed Vitals:   01/11/14 0010 01/11/14 0527 01/11/14 1659 01/12/14 0548  BP: 135/86 128/91 143/93 135/80  Pulse: 93 87 83 80  Temp: 98.3 F (36.8 C) 98 F (36.7 C) 98.1 F (36.7 C) 97.9 F (36.6 C)  TempSrc: Oral Oral Oral Oral  Resp: 18 17 18 18   Height:      Weight:      SpO2:        General: alert, cooperative and appears stated age 13Lochia: appropriate Uterine Fundus: firm Incision: n/a DVT Evaluation: No evidence of DVT seen on physical exam.  Discharge Diagnoses: Term Pregnancy-delivered and IOL for GHTN and Anemia  Discharge Information: Date: 01/12/2014 Activity: pelvic rest Diet: routine Medications: Tylenol #3, Ibuprofen and Iron Condition: stable Instructions: refer to practice specific booklet Discharge to: home   Newborn Data: Live born female  Birth Weight: 7 lb 11.3 oz (3496 g) APGAR: 4, 7  Home with mother pending bilirubin level at 0800 by Peds.  Sunshyne Johannesen is a [age] yof who [facility]  Patient progressed to a NSVD with an epidural for anesthesia.  1st degree laceration repair with vicryl 1 stictch with 300 cc of EBL.  Apgar 8 and 9 with 3 vessel cord.  Baby body receiving UV treatment for hyperbilirubinemia, stable.  Bottle feeding and Mirena for contraception as an outpatient.    Toilolo, Tifi 01/12/2014, 7:58 AM  I examined pt and agree with documentation above and PA-S plan of care. [facility],Kyree Adriano

## 2014-01-12 NOTE — Discharge Instructions (Signed)

## 2014-01-12 NOTE — Discharge Summary (Signed)
Attestation of Attending Supervision of Advanced Practitioner (CNM/NP): Evaluation and management procedures were performed by the Advanced Practitioner under my supervision and collaboration.  I have reviewed the Advanced Practitioner's note and chart, and I agree with the management and plan.  Yamari Ventola 01/12/2014 9:24 AM   

## 2014-01-19 ENCOUNTER — Ambulatory Visit: Payer: Medicaid Other | Admitting: Advanced Practice Midwife

## 2014-01-19 ENCOUNTER — Encounter (HOSPITAL_COMMUNITY): Payer: Self-pay | Admitting: *Deleted

## 2014-01-19 ENCOUNTER — Telehealth: Payer: Self-pay | Admitting: *Deleted

## 2014-01-19 NOTE — Telephone Encounter (Signed)
Mailbox full @ 2:37 pm. JSY

## 2014-01-21 MED ORDER — HYDROCODONE-ACETAMINOPHEN 5-325 MG PO TABS: 1.0000 | ORAL_TABLET | Freq: Four times a day (QID) | ORAL | Status: DC | PRN

## 2014-01-21 NOTE — Telephone Encounter (Signed)
Dr. Despina HiddenEure ordered Hydrocodone-acetaminophen 5/325 #30 with no refills. Pt to come by office and pick up Rx. JSY

## 2014-01-21 NOTE — Telephone Encounter (Signed)
Spoke with pt. Pt states she is having pelvic pain in right side. She is taking Tylenol and Motrin with no relief. Pt has a scheduled appt here on 02/04/14. She is requesting something to help with pain. Pt is not breastfeeding. Please advise. Thanks!!

## 2014-02-07 ENCOUNTER — Encounter: Payer: Medicaid Other | Admitting: Obstetrics & Gynecology

## 2014-02-17 ENCOUNTER — Ambulatory Visit: Payer: Medicaid Other | Admitting: Advanced Practice Midwife

## 2014-07-18 ENCOUNTER — Encounter (HOSPITAL_COMMUNITY): Payer: Self-pay | Admitting: Emergency Medicine

## 2014-07-18 ENCOUNTER — Emergency Department (HOSPITAL_COMMUNITY): Payer: Medicaid Other

## 2014-07-18 ENCOUNTER — Emergency Department (HOSPITAL_COMMUNITY)
Admission: EM | Admit: 2014-07-18 | Discharge: 2014-07-18 | Disposition: A | Discharge status: Home / Self Care | Payer: Medicaid Other | Attending: Emergency Medicine | Admitting: Emergency Medicine

## 2014-07-18 DIAGNOSIS — R1031 Right lower quadrant pain: Secondary | ICD-10-CM | POA: Insufficient documentation

## 2014-07-18 DIAGNOSIS — Z3202 Encounter for pregnancy test, result negative: Secondary | ICD-10-CM | POA: Insufficient documentation

## 2014-07-18 DIAGNOSIS — Z9089 Acquired absence of other organs: Secondary | ICD-10-CM | POA: Insufficient documentation

## 2014-07-18 DIAGNOSIS — Z87442 Personal history of urinary calculi: Secondary | ICD-10-CM | POA: Insufficient documentation

## 2014-07-18 DIAGNOSIS — N83209 Unspecified ovarian cyst, unspecified side: Secondary | ICD-10-CM | POA: Insufficient documentation

## 2014-07-18 DIAGNOSIS — N83201 Unspecified ovarian cyst, right side: Secondary | ICD-10-CM

## 2014-07-18 LAB — COMPREHENSIVE METABOLIC PANEL
ALT: 50 U/L — ABNORMAL HIGH (ref 0–35)
AST: 33 U/L (ref 0–37)
Albumin: 3.9 g/dL (ref 3.5–5.2)
Alkaline Phosphatase: 101 U/L (ref 39–117)
Anion gap: 13 (ref 5–15)
BILIRUBIN TOTAL: 0.2 mg/dL — AB (ref 0.3–1.2)
BUN: 9 mg/dL (ref 6–23)
CO2: 25 mEq/L (ref 19–32)
CREATININE: 0.7 mg/dL (ref 0.50–1.10)
Calcium: 9.1 mg/dL (ref 8.4–10.5)
Chloride: 101 mEq/L (ref 96–112)
GFR calc Af Amer: >90 mL/min (ref >90)
GFR calc non Af Amer: >90 mL/min (ref >90)
Glucose, Bld: 102 mg/dL — ABNORMAL HIGH (ref 70–99)
POTASSIUM: 4 meq/L (ref 3.7–5.3)
Sodium: 139 mEq/L (ref 137–147)
TOTAL PROTEIN: 7.3 g/dL (ref 6.0–8.3)

## 2014-07-18 LAB — CBC WITH DIFFERENTIAL/PLATELET
BASOS PCT: 0 % (ref 0–1)
Basophils Absolute: 0 10*3/uL (ref 0.0–0.1)
EOS ABS: 0.2 10*3/uL (ref 0.0–0.7)
Eosinophils Relative: 2 % (ref 0–5)
HCT: 36.9 % (ref 36.0–46.0)
Hemoglobin: 12.2 g/dL (ref 12.0–15.0)
Lymphocytes Relative: 28 % (ref 12–46)
Lymphs Abs: 2.3 10*3/uL (ref 0.7–4.0)
MCH: 27.5 pg (ref 26.0–34.0)
MCHC: 33.1 g/dL (ref 30.0–36.0)
MCV: 83.3 fL (ref 78.0–100.0)
MONOS PCT: 8 % (ref 3–12)
Monocytes Absolute: 0.6 10*3/uL (ref 0.1–1.0)
Neutro Abs: 5.1 10*3/uL (ref 1.7–7.7)
Neutrophils Relative %: 62 % (ref 43–77)
Platelets: 309 10*3/uL (ref 150–400)
RBC: 4.43 MIL/uL (ref 3.87–5.11)
RDW: 15.4 % (ref 11.5–15.5)
WBC: 8.2 10*3/uL (ref 4.0–10.5)

## 2014-07-18 LAB — WET PREP, GENITAL
CLUE CELLS WET PREP: NONE SEEN
TRICH WET PREP: NONE SEEN
Yeast Wet Prep HPF POC: NONE SEEN

## 2014-07-18 LAB — URINALYSIS, ROUTINE W REFLEX MICROSCOPIC
BILIRUBIN URINE: NEGATIVE
Glucose, UA: NEGATIVE mg/dL
Hgb urine dipstick: NEGATIVE
KETONES UR: NEGATIVE mg/dL
LEUKOCYTES UA: NEGATIVE
Nitrite: NEGATIVE
PH: 6 (ref 5.0–8.0)
PROTEIN: NEGATIVE mg/dL
Specific Gravity, Urine: >1.03 — ABNORMAL HIGH (ref 1.005–1.030)
UROBILINOGEN UA: 0.2 mg/dL (ref 0.0–1.0)

## 2014-07-18 LAB — PREGNANCY, URINE: PREG TEST UR: NEGATIVE

## 2014-07-18 LAB — LIPASE, BLOOD: LIPASE: 39 U/L (ref 11–59)

## 2014-07-18 MED ORDER — HYDROCODONE-ACETAMINOPHEN 5-325 MG PO TABS: 2.0000 | ORAL_TABLET | ORAL | Status: DC | PRN

## 2014-07-18 MED ORDER — IOHEXOL 300 MG/ML  SOLN
100.0000 mL | Freq: Once | INTRAMUSCULAR | Status: AC | PRN
  Administered 2014-07-18: 100 mL via INTRAVENOUS

## 2014-07-18 MED ORDER — MORPHINE SULFATE 4 MG/ML IJ SOLN
4.0000 mg | Freq: Once | INTRAMUSCULAR | Status: AC
  Administered 2014-07-18: 4 mg via INTRAVENOUS
  Filled 2014-07-18: qty 1

## 2014-07-18 MED ORDER — IOHEXOL 300 MG/ML  SOLN
50.0000 mL | Freq: Once | INTRAMUSCULAR | Status: AC | PRN
  Administered 2014-07-18: 50 mL via ORAL

## 2014-07-18 MED ORDER — SODIUM CHLORIDE 0.9 % IV BOLUS (SEPSIS)
1000.0000 mL | Freq: Once | INTRAVENOUS | Status: AC
  Administered 2014-07-18: 1000 mL via INTRAVENOUS

## 2014-07-18 MED ORDER — ONDANSETRON HCL 4 MG/2ML IJ SOLN
4.0000 mg | Freq: Once | INTRAMUSCULAR | Status: AC
  Administered 2014-07-18: 4 mg via INTRAVENOUS
  Filled 2014-07-18: qty 2

## 2014-07-18 MED ORDER — IBUPROFEN 800 MG PO TABS: 800.0000 mg | ORAL_TABLET | Freq: Three times a day (TID) | ORAL | Status: DC

## 2014-07-18 MED ORDER — HYDROCODONE-ACETAMINOPHEN 5-325 MG PO TABS
1.0000 | ORAL_TABLET | Freq: Once | ORAL | Status: AC
  Administered 2014-07-18: 1 via ORAL
  Filled 2014-07-18: qty 1

## 2014-07-18 NOTE — ED Notes (Signed)
Pt states intermittent  RLQ pain since having her baby in January. Pt states constant, worsening pain x 3 days and states numbness and pain to right leg.

## 2014-07-18 NOTE — ED Notes (Signed)
Pt states Morphine helped pain but pain increased since pelvic exam

## 2014-07-18 NOTE — Discharge Instructions (Signed)
Ovarian Cyst An ovarian cyst is a fluid-filled sac that forms on an ovary. The ovaries are small organs that produce eggs in women. Various types of cysts can form on the ovaries. Most are not cancerous. Many do not cause problems, and they often go away on their own. Some may cause symptoms and require treatment. Common types of ovarian cysts include:  Functional cysts--These cysts may occur every month during the menstrual cycle. This is normal. The cysts usually go away with the next menstrual cycle if the woman does not get pregnant. Usually, there are no symptoms with a functional cyst.  Endometrioma cysts--These cysts form from the tissue that lines the uterus. They are also called "chocolate cysts" because they become filled with blood that turns brown. This type of cyst can cause pain in the lower abdomen during intercourse and with your menstrual period.  Cystadenoma cysts--This type develops from the cells on the outside of the ovary. These cysts can get very big and cause lower abdomen pain and pain with intercourse. This type of cyst can twist on itself, cut off its blood supply, and cause severe pain. It can also easily rupture and cause a lot of pain.  Dermoid cysts--This type of cyst is sometimes found in both ovaries. These cysts may contain different kinds of body tissue, such as skin, teeth, hair, or cartilage. They usually do not cause symptoms unless they get very big.  Theca lutein cysts--These cysts occur when too much of a certain hormone (human chorionic gonadotropin) is produced and overstimulates the ovaries to produce an egg. This is most common after procedures used to assist with the conception of a baby (in vitro fertilization). CAUSES   Fertility drugs can cause a condition in which multiple large cysts are formed on the ovaries. This is called ovarian hyperstimulation syndrome.  A condition called polycystic ovary syndrome can cause hormonal imbalances that can lead to  nonfunctional ovarian cysts. SIGNS AND SYMPTOMS  Many ovarian cysts do not cause symptoms. If symptoms are present, they may include:  Pelvic pain or pressure.  Pain in the lower abdomen.  Pain during sexual intercourse.  Increasing girth (swelling) of the abdomen.  Abnormal menstrual periods.  Increasing pain with menstrual periods.  Stopping having menstrual periods without being pregnant. DIAGNOSIS  These cysts are commonly found during a routine or annual pelvic exam. Tests may be ordered to find out more about the cyst. These tests may include:  Ultrasound.  X-ray of the pelvis.  CT scan.  MRI.  Blood tests. TREATMENT  Many ovarian cysts go away on their own without treatment. Your health care provider may want to check your cyst regularly for 2-3 months to see if it changes. For women in menopause, it is particularly important to monitor a cyst closely because of the higher rate of ovarian cancer in menopausal women. When treatment is needed, it may include any of the following:  A procedure to drain the cyst (aspiration). This may be done using a long needle and ultrasound. It can also be done through a laparoscopic procedure. This involves using a thin, lighted tube with a tiny camera on the end (laparoscope) inserted through a small incision.  Surgery to remove the whole cyst. This may be done using laparoscopic surgery or an open surgery involving a larger incision in the lower abdomen.  Hormone treatment or birth control pills. These methods are sometimes used to help dissolve a cyst. HOME CARE INSTRUCTIONS   Only take over-the-counter   or prescription medicines as directed by your health care provider.  Follow up with your health care provider as directed.  Get regular pelvic exams and Pap tests. SEEK MEDICAL CARE IF:   Your periods are late, irregular, or painful, or they stop.  Your pelvic pain or abdominal pain does not go away.  Your abdomen becomes  larger or swollen.  You have pressure on your bladder or trouble emptying your bladder completely.  You have pain during sexual intercourse.  You have feelings of fullness, pressure, or discomfort in your stomach.  You lose weight for no apparent reason.  You feel generally ill.  You become constipated.  You lose your appetite.  You develop acne.  You have an increase in body and facial hair.  You are gaining weight, without changing your exercise and eating habits.  You think you are pregnant. SEEK IMMEDIATE MEDICAL CARE IF:   You have increasing abdominal pain.  You feel sick to your stomach (nauseous), and you throw up (vomit).  You develop a fever that comes on suddenly.  You have abdominal pain during a bowel movement.  Your menstrual periods become heavier than usual. MAKE SURE YOU:  Understand these instructions.  Will watch your condition.  Will get help right away if you are not doing well or get worse. Document Released: 12/09/2005 Document Revised: 12/14/2013 Document Reviewed: 08/16/2013 Exit[employer] Patient Information 2015 [employer]. This information is not intended to replace advice given to you by your health care provider. Make sure you discuss any questions you have with your health care provider.  

## 2014-07-18 NOTE — ED Provider Notes (Signed)
CSN: [id number]     Arrival date & time 07/18/14  7829 History  This chart was scribed for No att. providers found,  by Ashley Jacobs, ED Scribe. The patient was seen in room APA05/APA05 and the patient's care was started at 6:58 PM.   First MD Initiated Contact with Patient 07/18/14 1853     Chief Complaint  Patient presents with  . Abdominal Pain     (Consider location/radiation/quality/duration/timing/severity/associated sxs/prior Treatment) Patient is a 33 y.o. female presenting with abdominal pain. The history is provided by the patient and medical records. No language interpreter was used.  Abdominal Pain Associated symptoms: nausea   Associated symptoms: no vomiting    HPI Comments: [name] is a 32 y.o. female who presents to the Emergency Department complaining constant, worsening,of RLQ pain onset six months ago after giving birth. Pt explains the pain was only present with pressure or pushing the site, however, for the past two weeks the pain is constant. She has the sensation of "drilling in my bone, like ice" to her RLQ.She also mentions taking Advil or tylenol in the past which seemed to help . Pt reports having numbness to her right leg. Pt's last mensae was two months ago and last month she had her mensae for a day. Pt does not have an appetite. She has lower, lateral, right back pain.  Denies vomiting but reports feeling nauseated.  She has a metallic taste.  Pt had an ovarian cyst while pregnancy.  Pt had a prior appendectomy and kidney stents.  Past Medical History  Diagnosis Date  . Kidney stones    Past Surgical History  Procedure Laterality Date  . Kidney stents    . Lithotripsy    . Appendectomy     Family History  Problem Relation Age of Onset  . Cancer Mother     breast  . Diabetes Maternal Grandmother    History  Substance Use Topics  . Smoking status: Never Smoker   . Smokeless tobacco: Never Used  . Alcohol Use: No   OB History    Grav Para Term Preterm Abortions TAB SAB Ect Mult Living   4 4 4       4      Review of Systems  Gastrointestinal: Positive for nausea and abdominal pain. Negative for vomiting.  Genitourinary: Positive for menstrual problem.  Musculoskeletal: Positive for arthralgias, back pain and myalgias.  Neurological: Positive for numbness.  All other systems reviewed and are negative.     Allergies  Review of patient's allergies indicates no known allergies.  Home Medications   Prior to Admission medications   Medication Sig Start Date End Date Taking? Authorizing Provider  ibuprofen (ADVIL,MOTRIN) 200 MG tablet Take 400 mg by mouth every 6 (six) hours as needed for headache, mild pain or moderate pain.   Yes Historical Provider, MD  HYDROcodone-acetaminophen (NORCO/VICODIN) 5-325 MG per tablet Take 2 tablets by mouth every 4 (four) hours as needed. 07/18/14   Glynn Octave, MD  ibuprofen (ADVIL,MOTRIN) 800 MG tablet Take 1 tablet (800 mg total) by mouth 3 (three) times daily. 07/18/14   Glynn Octave, MD   BP 119/64  Pulse 97  Temp(Src) 97.9 F (36.6 C) (Oral)  Resp 16  Ht 5\' 3"  (1.6 m)  Wt 152 lb (68.947 kg)  BMI 26.93 kg/m2  SpO2 96%  LMP 05/10/2014  Breastfeeding? No Physical Exam  Nursing note and vitals reviewed. Constitutional: She is oriented to person, place, and time. She appears  well-developed and well-nourished. No distress.  HENT:  Head: Normocephalic and atraumatic.  Mouth/Throat: Oropharynx is clear and moist. No oropharyngeal exudate.  Eyes: Conjunctivae and EOM are normal. Pupils are equal, round, and reactive to light.  Neck: Normal range of motion. Neck supple.  No meningismus.  Cardiovascular: Normal rate, regular rhythm, normal heart sounds and intact distal pulses.   No murmur heard. Pulmonary/Chest: Effort normal and breath sounds normal. No respiratory distress.  Abdominal: Soft. There is tenderness (RLQ). There is rebound. There is no guarding.   Genitourinary:  Normal external genitalia. No CMT. Right adnexal tenderness. No adnexal tenderness. No discharge. Chaperone present  Musculoskeletal: Normal range of motion. She exhibits no edema and no tenderness.  No CVA tenderness 5/5 strength in bilateral lower extremities. Ankle plantar and dorsiflexion intact. Great toe extension intact bilaterally. +2 DP and PT pulses. +2 patellar reflexes bilaterally. Normal gait.   Neurological: She is alert and oriented to person, place, and time. No cranial nerve deficit. She exhibits normal muscle tone. Coordination normal.  No ataxia on finger to nose bilaterally. No pronator drift. 5/5 strength throughout. CN 2-12 intact. Negative Romberg. Equal grip strength. Sensation intact. Gait is normal.   Skin: Skin is warm.  Psychiatric: She has a normal mood and affect. Her behavior is normal.    ED Course  Procedures (including critical care time) DIAGNOSTIC STUDIES: Oxygen Saturation is 100% on room air, normal by my interpretation.    COORDINATION OF CARE:  1:37 AM Discussed course of care with pt . Pt understands and agrees.   Labs Review Labs Reviewed  WET PREP, GENITAL - Abnormal; Notable for the following:    WBC, Wet Prep HPF POC FEW (*)    All other components within normal limits  URINALYSIS, ROUTINE W REFLEX MICROSCOPIC - Abnormal; Notable for the following:    APPearance HAZY (*)    Specific Gravity, Urine >1.030 (*)    All other components within normal limits  COMPREHENSIVE METABOLIC PANEL - Abnormal; Notable for the following:    Glucose, Bld 102 (*)    ALT 50 (*)    Total Bilirubin 0.2 (*)    All other components within normal limits  GC/CHLAMYDIA PROBE AMP  PREGNANCY, URINE  CBC WITH DIFFERENTIAL  LIPASE, BLOOD    Imaging Review Ct Abdomen Pelvis W Contrast  07/18/2014   CLINICAL DATA:  Right lower quadrant pain, prior appendectomy, history of renal stones  EXAM: CT ABDOMEN AND PELVIS WITH CONTRAST  TECHNIQUE:  Multidetector CT imaging of the abdomen and pelvis was performed using the standard protocol following bolus administration of intravenous contrast.  CONTRAST:  OMNIPAQUE IOHEXOL 300 MG/ML  SOLN  COMPARISON:  04/06/2013  FINDINGS: Minimal dependent atelectasis at the lung bases.  Liver, spleen, pancreas, and adrenal glands are within normal limits.  Gallbladder is unremarkable. No intrahepatic or extrahepatic ductal dilatation.  Kidneys are within normal limits.  No hydronephrosis.  No evidence of bowel obstruction. Prior appendectomy. Mild to moderate colonic stool burden.  No evidence of abdominal aortic aneurysm.  No abdominopelvic ascites.  No suspicious abdominopelvic lymphadenopathy.  Uterus and left ovary are within normal limits. Dominant 3.3 cm right ovarian cyst/ follicle, likely physiologic.  Bladder is within normal limits.  Visualized osseous structures are within normal limits.  IMPRESSION: Dominant 3.3 cm right ovarian cyst/follicle, likely physiologic.  Otherwise, no CT findings to account for the patient's right abdominal pain.  No evidence of bowel obstruction.  Prior appendectomy.   Electronically Signed  By: Charline BillsSriyesh  Krishnan M.D.   On: 07/18/2014 20:57     EKG Interpretation None      MDM   Final diagnoses:  Cyst of right ovary   Intermittent right lower quadrant pain for the past several months that comes and goes. Worse in the past 3 days. No vomiting or diarrhea. No urinary or vaginal symptoms.  UA negative. HCG negative. Pelvic exam with right adnexal tenderness.  CT scan shows right-sided ovarian cysts. Appendix surgically absent. Low suspicion for torsion given constant nature of the pain and patient appears quite comfortable.  We'll treat with anti-inflammatories and pain control. Followup with gynecology. Return precautions discussed.  I personally performed the services described in this documentation, which was scribed in my presence. The recorded  information has been reviewed and is accurate.    Glynn OctaveStephen Yancey Pedley, MD 07/19/14 [phone]

## 2014-07-18 NOTE — ED Notes (Signed)
Pt verbalized understanding of no driving within 4 hours of taking Vicodin due to med causes drowsiness  

## 2014-07-20 LAB — GC/CHLAMYDIA PROBE AMP
CT Probe RNA: NEGATIVE
GC Probe RNA: NEGATIVE

## 2014-07-24 ENCOUNTER — Encounter (HOSPITAL_COMMUNITY): Payer: Self-pay | Admitting: Emergency Medicine

## 2014-07-24 ENCOUNTER — Emergency Department (HOSPITAL_COMMUNITY)
Admission: EM | Admit: 2014-07-24 | Discharge: 2014-07-24 | Disposition: A | Discharge status: Home / Self Care | Payer: Medicaid Other | Attending: Emergency Medicine | Admitting: Emergency Medicine

## 2014-07-24 DIAGNOSIS — Z79899 Other long term (current) drug therapy: Secondary | ICD-10-CM | POA: Insufficient documentation

## 2014-07-24 DIAGNOSIS — Z3202 Encounter for pregnancy test, result negative: Secondary | ICD-10-CM | POA: Insufficient documentation

## 2014-07-24 DIAGNOSIS — G8929 Other chronic pain: Secondary | ICD-10-CM | POA: Insufficient documentation

## 2014-07-24 DIAGNOSIS — R1031 Right lower quadrant pain: Secondary | ICD-10-CM | POA: Insufficient documentation

## 2014-07-24 DIAGNOSIS — N949 Unspecified condition associated with female genital organs and menstrual cycle: Secondary | ICD-10-CM | POA: Insufficient documentation

## 2014-07-24 DIAGNOSIS — Z87442 Personal history of urinary calculi: Secondary | ICD-10-CM | POA: Insufficient documentation

## 2014-07-24 DIAGNOSIS — Z87448 Personal history of other diseases of urinary system: Secondary | ICD-10-CM | POA: Insufficient documentation

## 2014-07-24 DIAGNOSIS — R102 Pelvic and perineal pain: Secondary | ICD-10-CM

## 2014-07-24 HISTORY — DX: Unspecified ovarian cyst, unspecified side: N83.209

## 2014-07-24 LAB — CBC WITH DIFFERENTIAL/PLATELET
Basophils Absolute: 0 10*3/uL (ref 0.0–0.1)
Basophils Relative: 1 % (ref 0–1)
EOS PCT: 5 % (ref 0–5)
Eosinophils Absolute: 0.3 10*3/uL (ref 0.0–0.7)
HCT: 34.7 % — ABNORMAL LOW (ref 36.0–46.0)
Hemoglobin: 11.3 g/dL — ABNORMAL LOW (ref 12.0–15.0)
LYMPHS ABS: 2.1 10*3/uL (ref 0.7–4.0)
Lymphocytes Relative: 32 % (ref 12–46)
MCH: 27.4 pg (ref 26.0–34.0)
MCHC: 32.6 g/dL (ref 30.0–36.0)
MCV: 84 fL (ref 78.0–100.0)
MONOS PCT: 5 % (ref 3–12)
Monocytes Absolute: 0.3 10*3/uL (ref 0.1–1.0)
Neutro Abs: 3.7 10*3/uL (ref 1.7–7.7)
Neutrophils Relative %: 57 % (ref 43–77)
PLATELETS: 282 10*3/uL (ref 150–400)
RBC: 4.13 MIL/uL (ref 3.87–5.11)
RDW: 15 % (ref 11.5–15.5)
WBC: 6.5 10*3/uL (ref 4.0–10.5)

## 2014-07-24 LAB — PREGNANCY, URINE: Preg Test, Ur: NEGATIVE

## 2014-07-24 LAB — COMPREHENSIVE METABOLIC PANEL
ALBUMIN: 3.5 g/dL (ref 3.5–5.2)
ALT: 29 U/L (ref 0–35)
AST: 22 U/L (ref 0–37)
Alkaline Phosphatase: 88 U/L (ref 39–117)
Anion gap: 10 (ref 5–15)
BUN: 8 mg/dL (ref 6–23)
CALCIUM: 8.7 mg/dL (ref 8.4–10.5)
CO2: 25 mEq/L (ref 19–32)
Chloride: 104 mEq/L (ref 96–112)
Creatinine, Ser: 0.81 mg/dL (ref 0.50–1.10)
GFR calc non Af Amer: >90 mL/min (ref >90)
GLUCOSE: 96 mg/dL (ref 70–99)
Potassium: 3.8 mEq/L (ref 3.7–5.3)
Sodium: 139 mEq/L (ref 137–147)
Total Bilirubin: <0.2 mg/dL — ABNORMAL LOW (ref 0.3–1.2)
Total Protein: 6.8 g/dL (ref 6.0–8.3)

## 2014-07-24 LAB — URINALYSIS, ROUTINE W REFLEX MICROSCOPIC
Bilirubin Urine: NEGATIVE
Glucose, UA: NEGATIVE mg/dL
KETONES UR: NEGATIVE mg/dL
Leukocytes, UA: NEGATIVE
Nitrite: NEGATIVE
PROTEIN: NEGATIVE mg/dL
Specific Gravity, Urine: 1.025 (ref 1.005–1.030)
Urobilinogen, UA: 0.2 mg/dL (ref 0.0–1.0)
pH: 6 (ref 5.0–8.0)

## 2014-07-24 LAB — URINE MICROSCOPIC-ADD ON

## 2014-07-24 MED ORDER — SODIUM CHLORIDE 0.9 % IV BOLUS (SEPSIS)
2000.0000 mL | Freq: Once | INTRAVENOUS | Status: AC
  Administered 2014-07-24: 1000 mL via INTRAVENOUS

## 2014-07-24 MED ORDER — HYDROCODONE-ACETAMINOPHEN 5-325 MG PO TABS: 1.0000 | ORAL_TABLET | Freq: Four times a day (QID) | ORAL | Status: DC | PRN

## 2014-07-24 MED ORDER — FENTANYL CITRATE 0.05 MG/ML IJ SOLN
100.0000 ug | Freq: Once | INTRAMUSCULAR | Status: AC
  Administered 2014-07-24: 100 ug via INTRAVENOUS
  Filled 2014-07-24: qty 2

## 2014-07-24 MED ORDER — ONDANSETRON HCL 4 MG/2ML IJ SOLN
4.0000 mg | Freq: Once | INTRAMUSCULAR | Status: AC
  Administered 2014-07-24: 4 mg via INTRAVENOUS
  Filled 2014-07-24: qty 2

## 2014-07-24 NOTE — ED Provider Notes (Signed)
CSN: [id number]     Arrival date & time 07/24/14  2044 History  This chart was scribed for No att. providers found by Leona Carry, ED Scribe. The patient was seen in APA11/APA11. The patient's care was started at 10:09 PM.     Chief Complaint  Patient presents with  . Abdominal Pain   Patient is a 33 y.o. female presenting with abdominal pain. The history is provided by the patient. No language interpreter was used.  Abdominal Pain  HPI Comments: [name] is a 33 y.o. female who presents to the Emergency Department complaining of gradually worsening constant chronic 24/7 right-sided abdominal pain beginning more than one year ago. Patient reports that the pain began when she was pregnant but has continued after she gave birth in January 2015. She reports that the pain has become more severe in recent weeks. Patient denies any modifying factors and states that the pain is not alleviated by changing position. She states that Tylenol and ibuprofen have been effective in relieving pain in the past, but are no longer effective. Patient also complains of vomiting and hematuria beginning yesterday. Patient denies fever, rash, or vaginal discharge.  Patient visited the ED last week for abdominal pain. CT showed a small cyst. She had a normal urine test and a negative pregnancy test.  PCP is Dr. Emelda Fear.   Past Medical History  Diagnosis Date  . Kidney stones   . Ovarian cyst    Past Surgical History  Procedure Laterality Date  . Kidney stents    . Lithotripsy    . Appendectomy     Family History  Problem Relation Age of Onset  . Cancer Mother     breast  . Diabetes Maternal Grandmother    History  Substance Use Topics  . Smoking status: Never Smoker   . Smokeless tobacco: Never Used  . Alcohol Use: No   OB History   Grav Para Term Preterm Abortions TAB SAB Ect Mult Living   4 4 4       4      Review of Systems  Gastrointestinal: Positive for abdominal pain.    10 Systems reviewed and are negative for acute change except as noted in the HPI.    Allergies  Review of patient's allergies indicates no known allergies.  Home Medications   Prior to Admission medications   Medication Sig Start Date End Date Taking? Authorizing Provider  ibuprofen (ADVIL,MOTRIN) 200 MG tablet Take 400 mg by mouth every 6 (six) hours as needed for headache, mild pain or moderate pain.   Yes Historical Provider, MD  ibuprofen (ADVIL,MOTRIN) 800 MG tablet Take 1 tablet (800 mg total) by mouth 3 (three) times daily. 07/18/14  Yes Glynn Octave, MD  HYDROcodone-acetaminophen (NORCO) 5-325 MG per tablet Take 1-2 tablets by mouth every 6 (six) hours as needed for severe pain. Dispense to go. 07/24/14   Hurman Horn, MD   Triage Vitals: BP 119/70  Pulse 81  Temp(Src) 98.9 F (37.2 C) (Oral)  Resp 18  Ht 5\' 3"  (1.6 m)  Wt 154 lb (69.854 kg)  BMI 27.29 kg/m2  SpO2 100%  LMP 05/10/2014 Physical Exam  Nursing note and vitals reviewed. Constitutional:  Awake, alert, nontoxic appearance.  HENT:  Head: Atraumatic.  Eyes: Right eye exhibits no discharge. Left eye exhibits no discharge.  Neck: Neck supple.  Cardiovascular: Normal rate and regular rhythm.   No murmur heard. Pulmonary/Chest: Effort normal and breath sounds normal. No respiratory  distress. She has no wheezes. She has no rales. She exhibits no tenderness.  Abdominal: Soft. Bowel sounds are normal. There is tenderness (Moderate RLQ tenderness). There is no rebound.  Moderate RLQ tenderness. Rest of abdomen non-tender. No CVA tenderness.   Genitourinary:  No cervical motion tenderness. No palpable masses. Moderate right adnexal tenderness.  Musculoskeletal: She exhibits no tenderness.  Baseline ROM, no obvious new focal weakness.  Neurological:  Mental status and motor strength appears baseline for patient and situation.  Skin: No rash noted.  Psychiatric: She has a normal mood and affect.    ED Course   Procedures (including critical care time)\ DIAGNOSTIC STUDIES: Oxygen Saturation is 100% on room air, normal by my interpretation.    COORDINATION OF CARE: 10:19 PM-Discussed treatment plan which includes labs with pt at bedside and pt agreed to plan.     Labs Review Labs Reviewed  URINALYSIS, ROUTINE W REFLEX MICROSCOPIC - Abnormal; Notable for the following:    APPearance HAZY (*)    Hgb urine dipstick LARGE (*)    All other components within normal limits  URINE MICROSCOPIC-ADD ON - Abnormal; Notable for the following:    Squamous Epithelial / LPF MANY (*)    Bacteria, UA FEW (*)    All other components within normal limits  CBC WITH DIFFERENTIAL - Abnormal; Notable for the following:    Hemoglobin 11.3 (*)    HCT 34.7 (*)    All other components within normal limits  COMPREHENSIVE METABOLIC PANEL - Abnormal; Notable for the following:    Total Bilirubin <0.2 (*)    All other components within normal limits  PREGNANCY, URINE    Imaging Review No results found.   EKG Interpretation None      MDM   Final diagnoses:  Chronic pelvic pain in female    I doubt any other EMC precluding discharge at this time including, but not necessarily limited to the following:PID, SBI, peritonitis, ovarian torsion.  This chart was scribed in my presence and reviewed by me personally.   Hurman HornJohn M Yetzali Weld, MD 07/25/14 [phone]

## 2014-07-24 NOTE — ED Notes (Signed)
Family at bedside. Patient watching TV at this time. No distress noted.

## 2014-07-24 NOTE — ED Notes (Signed)
Pt c/o abdominal pain x 1 week. States she was seen here last week for same symptoms and dx with ovarian cyst. Reports the pain has increased and is now unable to keep anything down. Also reports seeing "a little bit of blood in my urine".

## 2014-07-24 NOTE — Discharge Instructions (Signed)

## 2014-08-01 MED FILL — Hydrocodone-Acetaminophen Tab 5-325 MG: ORAL | Qty: 6 | Status: AC

## 2014-09-19 ENCOUNTER — Emergency Department (HOSPITAL_COMMUNITY)
Admission: EM | Admit: 2014-09-19 | Discharge: 2014-09-20 | Disposition: A | Discharge status: Home / Self Care | Payer: Medicaid Other | Attending: Emergency Medicine | Admitting: Emergency Medicine

## 2014-09-19 ENCOUNTER — Encounter (HOSPITAL_COMMUNITY): Payer: Self-pay | Admitting: Emergency Medicine

## 2014-09-19 DIAGNOSIS — N390 Urinary tract infection, site not specified: Secondary | ICD-10-CM | POA: Insufficient documentation

## 2014-09-19 DIAGNOSIS — Z791 Long term (current) use of non-steroidal anti-inflammatories (NSAID): Secondary | ICD-10-CM | POA: Insufficient documentation

## 2014-09-19 DIAGNOSIS — R51 Headache: Secondary | ICD-10-CM | POA: Insufficient documentation

## 2014-09-19 DIAGNOSIS — R079 Chest pain, unspecified: Secondary | ICD-10-CM | POA: Insufficient documentation

## 2014-09-19 DIAGNOSIS — Z87442 Personal history of urinary calculi: Secondary | ICD-10-CM | POA: Insufficient documentation

## 2014-09-19 DIAGNOSIS — G43009 Migraine without aura, not intractable, without status migrainosus: Secondary | ICD-10-CM | POA: Insufficient documentation

## 2014-09-19 DIAGNOSIS — Z8742 Personal history of other diseases of the female genital tract: Secondary | ICD-10-CM | POA: Insufficient documentation

## 2014-09-19 DIAGNOSIS — Z3202 Encounter for pregnancy test, result negative: Secondary | ICD-10-CM | POA: Insufficient documentation

## 2014-09-19 MED ORDER — METOCLOPRAMIDE HCL 5 MG/ML IJ SOLN
10.0000 mg | Freq: Once | INTRAMUSCULAR | Status: AC
  Administered 2014-09-20: 10 mg via INTRAVENOUS
  Filled 2014-09-19: qty 2

## 2014-09-19 MED ORDER — SODIUM CHLORIDE 0.9 % IV SOLN
Freq: Once | INTRAVENOUS | Status: AC
  Administered 2014-09-20: via INTRAVENOUS

## 2014-09-19 MED ORDER — KETOROLAC TROMETHAMINE 30 MG/ML IJ SOLN
30.0000 mg | Freq: Once | INTRAMUSCULAR | Status: AC
  Administered 2014-09-20: 30 mg via INTRAVENOUS
  Filled 2014-09-19: qty 1

## 2014-09-19 MED ORDER — DIPHENHYDRAMINE HCL 50 MG/ML IJ SOLN
25.0000 mg | Freq: Once | INTRAMUSCULAR | Status: AC
  Administered 2014-09-20: 25 mg via INTRAVENOUS
  Filled 2014-09-19: qty 1

## 2014-09-19 NOTE — ED Provider Notes (Signed)
CSN: [id number]     Arrival date & time 09/19/14  2054 History   First MD Initiated Contact with Patient 09/19/14 2325     Chief Complaint  Patient presents with  . Headache  . Chest Pain  . Nausea  . Emesis     (Consider location/radiation/quality/duration/timing/severity/associated sxs/prior Treatment) Patient is a 33 y.o. female presenting with headaches, chest pain, and vomiting.  Headache Associated symptoms: nausea, photophobia and vomiting   Associated symptoms: no abdominal pain, no congestion, no cough, no dizziness, no pain, no fever, no neck pain, no neck stiffness, no numbness and no sinus pressure   Chest Pain Associated symptoms: headache, nausea and vomiting   Associated symptoms: no abdominal pain, no cough, no dizziness, no dysphagia, no fever, no numbness, no shortness of breath and no weakness   Emesis Associated symptoms: headaches   Associated symptoms: no abdominal pain and no arthralgias     [name] is a 33 y.o. female who presents to the Emergency Department complaining of multiple complaints.  She states that she developed a left sided headache two days ago with intermittent nausea and vomiting that is only associated with food intake.  She states the headache was gradual in onset and she describes the headache as sharp pain to her left temple area with sensitivity to light.  She also c/o intermittent sharp stabbing pain to the left chest just below the left breast.  She reports the pain are so intense at times that "it takes my breath for a few minutes".  Tonight, she states that she developed a "pulling" sensation to her left face and neck with associated tingling to her face and "twitching" to the left eye.  She has been taking tylenol and ibuprofen without relief.  She denies recent illness, fever, diarrhea,  chills, persistent shortness of breath, dizziness, neck pain or stiffness, difficulty speaking or swallowing.       Past Medical History   Diagnosis Date  . Kidney stones   . Ovarian cyst    Past Surgical History  Procedure Laterality Date  . Kidney stents    . Lithotripsy    . Appendectomy     Family History  Problem Relation Age of Onset  . Cancer Mother     breast  . Diabetes Maternal Grandmother    History  Substance Use Topics  . Smoking status: Never Smoker   . Smokeless tobacco: Never Used  . Alcohol Use: No   OB History   Grav Para Term Preterm Abortions TAB SAB Ect Mult Living   Review of Systems  Constitutional: Negative for fever, activity change and appetite change.  HENT: Negative for congestion, drooling, facial swelling, sinus pressure, trouble swallowing and voice change.   Eyes: Positive for photophobia. Negative for pain and visual disturbance.  Respiratory: Negative for cough, chest tightness and shortness of breath.   Cardiovascular: Positive for chest pain.  Gastrointestinal: Positive for nausea and vomiting. Negative for abdominal pain and abdominal distention.  Genitourinary: Negative for dysuria and flank pain.  Musculoskeletal: Negative for arthralgias, neck pain and neck stiffness.  Skin: Negative for rash and wound.  Neurological: Positive for headaches. Negative for dizziness, syncope, facial asymmetry, speech difficulty, weakness, light-headedness and numbness.  Hematological: Negative for adenopathy.  Psychiatric/Behavioral: Negative for confusion and decreased concentration.  All other systems reviewed and are negative.     Allergies  Review of patient's allergies indicates  no known allergies.  Home Medications   Prior to Admission medications   Medication Sig Start Date End Date Taking? Authorizing Provider  HYDROcodone-acetaminophen (NORCO) 5-325 MG per tablet Take 1-2 tablets by mouth every 6 (six) hours as needed for severe pain. Dispense to go. 07/24/14   Hurman Horn, MD  ibuprofen (ADVIL,MOTRIN) 200 MG tablet Take 400 mg by mouth every 6 (six)  hours as needed for headache, mild pain or moderate pain.    Historical Provider, MD  ibuprofen (ADVIL,MOTRIN) 800 MG tablet Take 1 tablet (800 mg total) by mouth 3 (three) times daily. 07/18/14   Glynn Octave, MD   BP 124/61  Pulse 92  Temp(Src) 98.7 F (37.1 C) (Oral)  Resp 20  Ht  (1.6 m)  Wt 153 lb (69.4 kg)  BMI 27.11 kg/m2  SpO2 100%  LMP 09/13/2014 Physical Exam  Nursing note and vitals reviewed. Constitutional: She is oriented to person, place, and time. She appears well-developed and well-nourished. No distress.  HENT:  Head: Normocephalic and atraumatic.  Mouth/Throat: Oropharynx is clear and moist.  Eyes: Conjunctivae and EOM are normal. Pupils are equal, round, and reactive to light.  Neck: Normal range of motion, full passive range of motion without pain and phonation normal. Neck supple. No spinous process tenderness and no muscular tenderness present. No rigidity. No Kernig's sign noted. No thyromegaly present.  Cardiovascular: Normal rate, regular rhythm, normal heart sounds and intact distal pulses.   No murmur heard. Pulmonary/Chest: Effort normal and breath sounds normal. No respiratory distress. She exhibits tenderness.  ttp of the lower anterior left chest.  No crepitus.  No breast tenderness  Abdominal: Soft. She exhibits no distension and no mass. There is no tenderness. There is no rebound and no guarding.  Musculoskeletal: Normal range of motion.  Lymphadenopathy:    She has no cervical adenopathy.  Neurological: She is alert and oriented to person, place, and time. She has normal strength. No cranial nerve deficit or sensory deficit. She exhibits normal muscle tone. Coordination and gait normal. GCS eye subscore is 4. GCS verbal subscore is 5. GCS motor subscore is 6.  Reflex Scores:      Tricep reflexes are 2+ on the right side and 2+ on the left side.      Bicep reflexes are 2+ on the right side and 2+ on the left side. Pt has 5/5 strength against  resistance of the bilateral upper and lower extremities.    Skin: Skin is warm and dry.  Psychiatric: She has a normal mood and affect.    ED Course  Procedures (including critical care time) Labs Review Labs Reviewed  CBC WITH DIFFERENTIAL - Abnormal; Notable for the following:    Hemoglobin 11.5 (*)    HCT 34.9 (*)    All other components within normal limits  BASIC METABOLIC PANEL - Abnormal; Notable for the following:    Glucose, Bld 101 (*)    All other components within normal limits  URINALYSIS, ROUTINE W REFLEX MICROSCOPIC - Abnormal; Notable for the following:    APPearance HAZY (*)    Hgb urine dipstick MODERATE (*)    Leukocytes, UA SMALL (*)    All other components within normal limits  URINE MICROSCOPIC-ADD ON - Abnormal; Notable for the following:    Squamous Epithelial / LPF MANY (*)    Bacteria, UA MANY (*)    Crystals CA OXALATE CRYSTALS (*)    All other components within normal limits  POC URINE  PREG, ED    Imaging Review No results found.   EKG Interpretation None       Date: 09/20/2014  Rate: 93  Rhythm: normal sinus rhythm  QRS Axis: normal  Intervals: normal  ST/T Wave abnormalities: normal  Conduction Disutrbances:none  Narrative Interpretation:   Old EKG Reviewed: none available   EKG read by Dr. Deretha Emory  MDM   Final diagnoses:  Migraine without aura and without status migrainosus, not intractable  UTI (lower urinary tract infection)    Patient is well appearing.  Non-toxic. Headache of gradual onset and unilateral, likely migraine. No concerning sx's for Bell's palsy, SAH or trigeminal neuralgia.  No focal neuro deficits,dysarthia or facial droop, no meningeal signs.  PERC negative.     Patient reports feeling better after IV toradol, benadryl and reglan.  On further questioning, she reports having some increased frequency of urination and dark colored urine.  No CVA tenderness on exam.  Will treat for UTI and urine culture  pending.    Patient also seen by Dr. Read Drivers.  Patient appears stable for d/c and agrees to arrange follow-up with her PMD for recheck.  Return precautions given.    Farouk Vivero L. Trisha Mangle, PA-C 09/20/14 1308

## 2014-09-19 NOTE — ED Notes (Signed)
Pt c/o headache to left side and central chest pain with n/v x 2 days

## 2014-09-20 LAB — URINE MICROSCOPIC-ADD ON

## 2014-09-20 LAB — POC URINE PREG, ED: PREG TEST UR: NEGATIVE

## 2014-09-20 LAB — URINALYSIS, ROUTINE W REFLEX MICROSCOPIC
Bilirubin Urine: NEGATIVE
Glucose, UA: NEGATIVE mg/dL
Ketones, ur: NEGATIVE mg/dL
Nitrite: NEGATIVE
PH: 6 (ref 5.0–8.0)
Protein, ur: NEGATIVE mg/dL
SPECIFIC GRAVITY, URINE: 1.02 (ref 1.005–1.030)
UROBILINOGEN UA: 1 mg/dL (ref 0.0–1.0)

## 2014-09-20 LAB — CBC WITH DIFFERENTIAL/PLATELET
BASOS PCT: 0 % (ref 0–1)
Basophils Absolute: 0 10*3/uL (ref 0.0–0.1)
Eosinophils Absolute: 0.2 10*3/uL (ref 0.0–0.7)
Eosinophils Relative: 3 % (ref 0–5)
HCT: 34.9 % — ABNORMAL LOW (ref 36.0–46.0)
Hemoglobin: 11.5 g/dL — ABNORMAL LOW (ref 12.0–15.0)
LYMPHS PCT: 35 % (ref 12–46)
Lymphs Abs: 2.4 10*3/uL (ref 0.7–4.0)
MCH: 27.3 pg (ref 26.0–34.0)
MCHC: 33 g/dL (ref 30.0–36.0)
MCV: 82.9 fL (ref 78.0–100.0)
Monocytes Absolute: 0.4 10*3/uL (ref 0.1–1.0)
Monocytes Relative: 6 % (ref 3–12)
NEUTROS ABS: 3.7 10*3/uL (ref 1.7–7.7)
NEUTROS PCT: 56 % (ref 43–77)
Platelets: 289 10*3/uL (ref 150–400)
RBC: 4.21 MIL/uL (ref 3.87–5.11)
RDW: 13.9 % (ref 11.5–15.5)
WBC: 6.7 10*3/uL (ref 4.0–10.5)

## 2014-09-20 LAB — BASIC METABOLIC PANEL
Anion gap: 12 (ref 5–15)
BUN: 8 mg/dL (ref 6–23)
CHLORIDE: 104 meq/L (ref 96–112)
CO2: 24 meq/L (ref 19–32)
Calcium: 8.7 mg/dL (ref 8.4–10.5)
Creatinine, Ser: 0.65 mg/dL (ref 0.50–1.10)
GFR calc Af Amer: >90 mL/min (ref >90)
GFR calc non Af Amer: >90 mL/min (ref >90)
GLUCOSE: 101 mg/dL — AB (ref 70–99)
Potassium: 4 mEq/L (ref 3.7–5.3)
Sodium: 140 mEq/L (ref 137–147)

## 2014-09-20 MED ORDER — CEPHALEXIN 500 MG PO CAPS: 500.0000 mg | ORAL_CAPSULE | Freq: Four times a day (QID) | ORAL | Status: DC

## 2014-09-20 MED ORDER — HYDROCODONE-ACETAMINOPHEN 5-325 MG PO TABS: ORAL_TABLET | ORAL | Status: DC

## 2014-09-20 NOTE — Discharge Instructions (Signed)
Migraine Headache A migraine headache is very bad, throbbing pain on one or both sides of your head. Talk to your doctor about what things may bring on (trigger) your migraine headaches. HOME CARE  Only take medicines as told by your doctor.  Lie down in a dark, quiet room when you have a migraine.  Keep a journal to find out if certain things bring on migraine headaches. For example, write down:  What you eat and drink.  How much sleep you get.  Any change to your diet or medicines.  Lessen how much alcohol you drink.  Quit smoking if you smoke.  Get enough sleep.  Lessen any stress in your life.  Keep lights dim if bright lights bother you or make your migraines worse. GET HELP RIGHT AWAY IF:   Your migraine becomes really bad.  You have a fever.  You have a stiff neck.  You have trouble seeing.  Your muscles are weak, or you lose muscle control.  You lose your balance or have trouble walking.  You feel like you will pass out (faint), or you pass out.  You have really bad symptoms that are different than your first symptoms. MAKE SURE YOU:   Understand these instructions.  Will watch your condition.  Will get help right away if you are not doing well or get worse. Document Released: 09/17/2008 Document Revised: 03/02/2012 Document Reviewed: 08/16/2013 [facility] Patient Information 2015 [place]. This information is not intended to replace advice given to you by your health care provider. Make sure you discuss any questions you have with your health care provider.  Urinary Tract Infection Urinary tract infections (UTIs) can develop anywhere along your urinary tract. Your urinary tract is your body's drainage system for removing wastes and extra water. Your urinary tract includes two kidneys, two ureters, a bladder, and a urethra. Your kidneys are a pair of bean-shaped organs. Each kidney is about the size of your fist. They are located below your ribs, one  on each side of your spine. CAUSES Infections are caused by microbes, which are microscopic organisms, including fungi, viruses, and bacteria. These organisms are so small that they can only be seen through a microscope. Bacteria are the microbes that most commonly cause UTIs. SYMPTOMS  Symptoms of UTIs may vary by age and gender of the patient and by the location of the infection. Symptoms in young women typically include a frequent and intense urge to urinate and a painful, burning feeling in the bladder or urethra during urination. Older women and men are more likely to be tired, shaky, and weak and have muscle aches and abdominal pain. A fever may mean the infection is in your kidneys. Other symptoms of a kidney infection include pain in your back or sides below the ribs, nausea, and vomiting. DIAGNOSIS To diagnose a UTI, your caregiver will ask you about your symptoms. Your caregiver also will ask to provide a urine sample. The urine sample will be tested for bacteria and white blood cells. White blood cells are made by your body to help fight infection. TREATMENT  Typically, UTIs can be treated with medication. Because most UTIs are caused by a bacterial infection, they usually can be treated with the use of antibiotics. The choice of antibiotic and length of treatment depend on your symptoms and the type of bacteria causing your infection. HOME CARE INSTRUCTIONS  If you were prescribed antibiotics, take them exactly as your caregiver instructs you. Finish the medication even if  you feel better after you have only taken some of the medication.  Drink enough water and fluids to keep your urine clear or pale yellow.  Avoid caffeine, tea, and carbonated beverages. They tend to irritate your bladder.  Empty your bladder often. Avoid holding urine for long periods of time.  Empty your bladder before and after sexual intercourse.  After a bowel movement, women should cleanse from front to back.  Use each tissue only once. SEEK MEDICAL CARE IF:   You have back pain.  You develop a fever.  Your symptoms do not begin to resolve within 3 days. SEEK IMMEDIATE MEDICAL CARE IF:   You have severe back pain or lower abdominal pain.  You develop chills.  You have nausea or vomiting.  You have continued burning or discomfort with urination. MAKE SURE YOU:   Understand these instructions.  Will watch your condition.  Will get help right away if you are not doing well or get worse. Document Released: 09/18/2005 Document Revised: 06/09/2012 Document Reviewed: 01/17/2012 [facility] Patient Information 2015 [place], [place]. This information is not intended to replace advice given to you by your health care provider. Make sure you discuss any questions you have with your health care provider.

## 2014-09-20 NOTE — ED Provider Notes (Signed)
Medical screening examination/treatment/procedure(s) were performed by non-physician practitioner and as supervising physician I was immediately available for consultation/collaboration.   EKG Interpretation None        Hanley SeamenJohn L Shaul Trautman, MD 09/20/14 [phone]

## 2014-09-20 NOTE — ED Notes (Signed)
Discharge instructions and follow up care understanding verbalized by patient. No concerns voiced.  

## 2014-09-21 LAB — URINE CULTURE: Colony Count: 60000

## 2014-10-24 ENCOUNTER — Encounter (HOSPITAL_COMMUNITY): Payer: Self-pay | Admitting: Emergency Medicine

## 2015-03-22 ENCOUNTER — Encounter (HOSPITAL_COMMUNITY): Payer: Self-pay | Admitting: *Deleted

## 2015-03-22 ENCOUNTER — Emergency Department (HOSPITAL_COMMUNITY)
Admission: EM | Admit: 2015-03-22 | Discharge: 2015-03-23 | Disposition: A | Discharge status: Home / Self Care | Payer: Medicaid Other | Attending: Emergency Medicine | Admitting: Emergency Medicine

## 2015-03-22 DIAGNOSIS — O9989 Other specified diseases and conditions complicating pregnancy, childbirth and the puerperium: Secondary | ICD-10-CM | POA: Insufficient documentation

## 2015-03-22 DIAGNOSIS — Z87442 Personal history of urinary calculi: Secondary | ICD-10-CM | POA: Insufficient documentation

## 2015-03-22 DIAGNOSIS — O210 Mild hyperemesis gravidarum: Secondary | ICD-10-CM | POA: Insufficient documentation

## 2015-03-22 DIAGNOSIS — M549 Dorsalgia, unspecified: Secondary | ICD-10-CM | POA: Insufficient documentation

## 2015-03-22 DIAGNOSIS — R1084 Generalized abdominal pain: Secondary | ICD-10-CM | POA: Insufficient documentation

## 2015-03-22 DIAGNOSIS — Z3A00 Weeks of gestation of pregnancy not specified: Secondary | ICD-10-CM | POA: Insufficient documentation

## 2015-03-22 DIAGNOSIS — Z8742 Personal history of other diseases of the female genital tract: Secondary | ICD-10-CM | POA: Insufficient documentation

## 2015-03-22 DIAGNOSIS — R112 Nausea with vomiting, unspecified: Secondary | ICD-10-CM

## 2015-03-22 DIAGNOSIS — Z9889 Other specified postprocedural states: Secondary | ICD-10-CM | POA: Insufficient documentation

## 2015-03-22 DIAGNOSIS — Z3490 Encounter for supervision of normal pregnancy, unspecified, unspecified trimester: Secondary | ICD-10-CM

## 2015-03-22 DIAGNOSIS — Z9049 Acquired absence of other specified parts of digestive tract: Secondary | ICD-10-CM | POA: Insufficient documentation

## 2015-03-22 LAB — CBC WITH DIFFERENTIAL/PLATELET
BASOS PCT: 0 % (ref 0–1)
Basophils Absolute: 0 10*3/uL (ref 0.0–0.1)
EOS ABS: 0.4 10*3/uL (ref 0.0–0.7)
Eosinophils Relative: 3 % (ref 0–5)
HEMATOCRIT: 35.8 % — AB (ref 36.0–46.0)
Hemoglobin: 11.7 g/dL — ABNORMAL LOW (ref 12.0–15.0)
LYMPHS ABS: 2.6 10*3/uL (ref 0.7–4.0)
Lymphocytes Relative: 24 % (ref 12–46)
MCH: 27.7 pg (ref 26.0–34.0)
MCHC: 32.7 g/dL (ref 30.0–36.0)
MCV: 84.8 fL (ref 78.0–100.0)
Monocytes Absolute: 0.5 10*3/uL (ref 0.1–1.0)
Monocytes Relative: 4 % (ref 3–12)
NEUTROS ABS: 7.8 10*3/uL — AB (ref 1.7–7.7)
NEUTROS PCT: 69 % (ref 43–77)
Platelets: 315 10*3/uL (ref 150–400)
RBC: 4.22 MIL/uL (ref 3.87–5.11)
RDW: 13.3 % (ref 11.5–15.5)
WBC: 11.2 10*3/uL — ABNORMAL HIGH (ref 4.0–10.5)

## 2015-03-22 LAB — COMPREHENSIVE METABOLIC PANEL
ALT: 34 U/L (ref 0–35)
ANION GAP: 8 (ref 5–15)
AST: 31 U/L (ref 0–37)
Albumin: 3.9 g/dL (ref 3.5–5.2)
Alkaline Phosphatase: 100 U/L (ref 39–117)
BUN: 6 mg/dL (ref 6–23)
CHLORIDE: 107 mmol/L (ref 96–112)
CO2: 23 mmol/L (ref 19–32)
CREATININE: 0.62 mg/dL (ref 0.50–1.10)
Calcium: 8.9 mg/dL (ref 8.4–10.5)
GFR calc non Af Amer: >90 mL/min (ref >90)
Glucose, Bld: 134 mg/dL — ABNORMAL HIGH (ref 70–99)
Potassium: 3.4 mmol/L — ABNORMAL LOW (ref 3.5–5.1)
Sodium: 138 mmol/L (ref 135–145)
Total Bilirubin: 0.1 mg/dL — ABNORMAL LOW (ref 0.3–1.2)
Total Protein: 7.2 g/dL (ref 6.0–8.3)

## 2015-03-22 LAB — URINALYSIS, ROUTINE W REFLEX MICROSCOPIC
Bilirubin Urine: NEGATIVE
GLUCOSE, UA: NEGATIVE mg/dL
Ketones, ur: NEGATIVE mg/dL
Leukocytes, UA: NEGATIVE
Nitrite: NEGATIVE
Protein, ur: NEGATIVE mg/dL
Specific Gravity, Urine: 1.02 (ref 1.005–1.030)
UROBILINOGEN UA: 1 mg/dL (ref 0.0–1.0)
pH: 7 (ref 5.0–8.0)

## 2015-03-22 LAB — PREGNANCY, URINE: Preg Test, Ur: POSITIVE — AB

## 2015-03-22 LAB — URINE MICROSCOPIC-ADD ON

## 2015-03-22 LAB — HCG, QUANTITATIVE, PREGNANCY: hCG, Beta Chain, Quant, S: 56 m[IU]/mL — ABNORMAL HIGH (ref <5)

## 2015-03-22 LAB — LIPASE, BLOOD: Lipase: 37 U/L (ref 11–59)

## 2015-03-22 MED ORDER — ONDANSETRON HCL 4 MG/2ML IJ SOLN
4.0000 mg | Freq: Once | INTRAMUSCULAR | Status: AC
  Administered 2015-03-22: 4 mg via INTRAVENOUS
  Filled 2015-03-22: qty 2

## 2015-03-22 MED ORDER — SODIUM CHLORIDE 0.9 % IV SOLN
1000.0000 mL | Freq: Once | INTRAVENOUS | Status: AC
  Administered 2015-03-22: 1000 mL via INTRAVENOUS

## 2015-03-22 NOTE — ED Provider Notes (Signed)
CSN: [id number]     Arrival date & time 03/22/15  2218 History  This chart was scribed for Loren Raceravid Dontay Harm, MD by Gwenyth Oberatherine Macek, ED Scribe. This patient was seen in room APA14/APA14 and the patient's care was started at 11:07 PM.    Chief Complaint  Patient presents with  . Abdominal Pain   The history is provided by the patient. No language interpreter was used.    HPI Comments: [name] is a 34 y.o. female who presents to the Emergency Department complaining of intermittent, gradually worsening, burning pain in her right lower back that radiates to her RLQ and started 3 days ago. Pt states vomiting after eating, nausea and difficulty urinating as associated sympoms. Eating does not make the pain worse. Pt's LNMP started on 2/8, but she notes 1 day of spotting earlier this month. Pt has a history of kidney stones, but she is not sure if current pain is similar. She denies vaginal bleeding or discharge as associated symptoms.  Past Medical History  Diagnosis Date  . Kidney stones   . Ovarian cyst    Past Surgical History  Procedure Laterality Date  . Kidney stents    . Lithotripsy    . Appendectomy     Family History  Problem Relation Age of Onset  . Cancer Mother     breast  . Diabetes Maternal Grandmother    History  Substance Use Topics  . Smoking status: Never Smoker   . Smokeless tobacco: Never Used  . Alcohol Use: No   OB History    Gravida Para Term Preterm AB TAB SAB Ectopic Multiple Living   4 4 4       4      Review of Systems  Constitutional: Negative for fever and chills.  Respiratory: Negative for shortness of breath.   Cardiovascular: Negative for chest pain.  Gastrointestinal: Positive for nausea, vomiting and abdominal pain. Negative for diarrhea and constipation.  Genitourinary: Positive for flank pain and difficulty urinating. Negative for dysuria and frequency.  Musculoskeletal: Positive for back pain. Negative for neck pain and neck  stiffness.  Neurological: Negative for dizziness, weakness, light-headedness, numbness and headaches.  All other systems reviewed and are negative.     Allergies  Review of patient's allergies indicates no known allergies.  Home Medications   Prior to Admission medications   Medication Sig Start Date End Date Taking? Authorizing Provider  acetaminophen (TYLENOL) 500 MG tablet Take 500 mg by mouth every 6 (six) hours as needed for mild pain or moderate pain.    Historical Provider, MD  ondansetron (ZOFRAN ODT) 4 MG disintegrating tablet 4mg  ODT q4 hours prn nausea/vomit Patient not taking: Reported on 03/28/2015 03/23/15   Loren Raceravid Ezra Marquess, MD   BP 127/54 mmHg  Pulse 95  Temp(Src) 98.6 F (37 C) (Oral)  Resp 20  Ht 5\' 3"  (1.6 m)  Wt 148 lb (67.132 kg)  BMI 26.22 kg/m2  SpO2 99%  LMP 02/26/2015 Physical Exam  Constitutional: She is oriented to person, place, and time. She appears well-developed and well-nourished. No distress.  HENT:  Head: Normocephalic and atraumatic.  Mouth/Throat: Oropharynx is clear and moist. No oropharyngeal exudate.  Eyes: EOM are normal. Pupils are equal, round, and reactive to light.  Neck: Normal range of motion. Neck supple.  Cardiovascular: Normal rate and regular rhythm.   Pulmonary/Chest: Effort normal and breath sounds normal. No respiratory distress. She has no wheezes. She has no rales. She exhibits no tenderness.  Abdominal:  Soft. Bowel sounds are normal. She exhibits no distension and no mass. There is tenderness (diffuse abdominal tenderness especially in the right upper and right lower quadrants.). There is no rebound and no guarding.  Musculoskeletal: Normal range of motion. She exhibits no edema or tenderness.  No CVA tenderness bilaterally. Patient does have mild right-sided paralumbar tenderness with palpation.  Neurological: She is alert and oriented to person, place, and time.  Skin: Skin is warm and dry. No rash noted. No erythema.   Psychiatric: She has a normal mood and affect. Her behavior is normal.  Nursing note and vitals reviewed.   ED Course  Procedures   DIAGNOSTIC STUDIES: Oxygen Saturation is 100% on RA, normal by my interpretation.    COORDINATION OF CARE: 11:10 PM Discussed treatment plan with pt at bedside and pt agreed to plan.   Labs Review Labs Reviewed  URINALYSIS, ROUTINE W REFLEX MICROSCOPIC - Abnormal; Notable for the following:    Color, Urine BROWN (*)    APPearance HAZY (*)    Hgb urine dipstick LARGE (*)    All other components within normal limits  PREGNANCY, URINE - Abnormal; Notable for the following:    Preg Test, Ur POSITIVE (*)    All other components within normal limits  CBC WITH DIFFERENTIAL/PLATELET - Abnormal; Notable for the following:    WBC 11.2 (*)    Hemoglobin 11.7 (*)    HCT 35.8 (*)    Neutro Abs 7.8 (*)    All other components within normal limits  COMPREHENSIVE METABOLIC PANEL - Abnormal; Notable for the following:    Potassium 3.4 (*)    Glucose, Bld 134 (*)    Total Bilirubin 0.1 (*)    All other components within normal limits  URINE MICROSCOPIC-ADD ON - Abnormal; Notable for the following:    Squamous Epithelial / LPF MANY (*)    Bacteria, UA MANY (*)    All other components within normal limits  HCG, QUANTITATIVE, PREGNANCY - Abnormal; Notable for the following:    hCG, Beta Chain, Quant, S 56 (*)    All other components within normal limits  LIPASE, BLOOD    Imaging Review No results found.   EKG Interpretation None      MDM   Final diagnoses:  Non-intractable vomiting with nausea, vomiting of unspecified type  Pregnancy  Generalized abdominal pain    I personally performed the services described in this documentation, which was scribed in my presence. The recorded information has been reviewed and is accurate.  Bedside ultrasound limited views of the gallbladder. Appears contracted. Patient also has empty uterus. Questionable  right sided ovarian cyst.   Benign abdomen. No rebound or guarding. HCG level is 57. Either early pregnancy or miscarriage. Reiterated the patient the need to follow-up in 2 days to have her hCG level retested. She is also urged to follow-up with her OB/GYN. She's been given precautions to return immediately for worsening pain, fever, vaginal bleeding or for any concerns.  Low suspicion for surgical emergency such as appendicitis or cholecystitis. Unable to rule out ectopic pregnancy. Thus close follow-up is required.  Loren Racer, MD 04/01/15 [phone]

## 2015-03-22 NOTE — ED Notes (Signed)
Pt c/o right sided back , flank, and abdominal pain, n/v and difficulty urinating since Sunday.

## 2015-03-23 MED ORDER — ONDANSETRON 4 MG PO TBDP: ORAL_TABLET | ORAL | Status: DC

## 2015-03-23 NOTE — Discharge Instructions (Signed)
Abdominal Pain, Women °Abdominal (stomach, pelvic, or belly) pain can be caused by many things. It is important to tell your doctor: °· The location of the pain. °· Does it come and go or is it present all the time? °· Are there things that start the pain (eating certain foods, exercise)? °· Are there other symptoms associated with the pain (fever, nausea, vomiting, diarrhea)? °All of this is helpful to know when trying to find the cause of the pain. °CAUSES  °· Stomach: virus or bacteria infection, or ulcer. °· Intestine: appendicitis (inflamed appendix), regional ileitis (Crohn's disease), ulcerative colitis (inflamed colon), irritable bowel syndrome, diverticulitis (inflamed diverticulum of the colon), or cancer of the stomach or intestine. °· Gallbladder disease or stones in the gallbladder. °· Kidney disease, kidney stones, or infection. °· Pancreas infection or cancer. °· Fibromyalgia (pain disorder). °· Diseases of the female organs: °· Uterus: fibroid (non-cancerous) tumors or infection. °· Fallopian tubes: infection or tubal pregnancy. °· Ovary: cysts or tumors. °· Pelvic adhesions (scar tissue). °· Endometriosis (uterus lining tissue growing in the pelvis and on the pelvic organs). °· Pelvic congestion syndrome (female organs filling up with blood just before the menstrual period). °· Pain with the menstrual period. °· Pain with ovulation (producing an egg). °· Pain with an IUD (intrauterine device, birth control) in the uterus. °· Cancer of the female organs. °· Functional pain (pain not caused by a disease, may improve without treatment). °· Psychological pain. °· Depression. °DIAGNOSIS  °Your doctor will decide the seriousness of your pain by doing an examination. °· Blood tests. °· X-rays. °· Ultrasound. °· CT scan (computed tomography, special type of X-ray). °· MRI (magnetic resonance imaging). °· Cultures, for infection. °· Barium enema (dye inserted in the large intestine, to better view it with  X-rays). °· Colonoscopy (looking in intestine with a lighted tube). °· Laparoscopy (minor surgery, looking in abdomen with a lighted tube). °· Major abdominal exploratory surgery (looking in abdomen with a large incision). °TREATMENT  °The treatment will depend on the cause of the pain.  °· Many cases can be observed and treated at home. °· Over-the-counter medicines recommended by your caregiver. °· Prescription medicine. °· Antibiotics, for infection. °· Birth control pills, for painful periods or for ovulation pain. °· Hormone treatment, for endometriosis. °· Nerve blocking injections. °· Physical therapy. °· Antidepressants. °· Counseling with a psychologist or psychiatrist. °· Minor or major surgery. °HOME CARE INSTRUCTIONS  °· Do not take laxatives, unless directed by your caregiver. °· Take over-the-counter pain medicine only if ordered by your caregiver. Do not take aspirin because it can cause an upset stomach or bleeding. °· Try a clear liquid diet (broth or water) as ordered by your caregiver. Slowly move to a bland diet, as tolerated, if the pain is related to the stomach or intestine. °· Have a thermometer and take your temperature several times a day, and record it. °· Bed rest and sleep, if it helps the pain. °· Avoid sexual intercourse, if it causes pain. °· Avoid stressful situations. °· Keep your follow-up appointments and tests, as your caregiver orders. °· If the pain does not go away with medicine or surgery, you may try: °· Acupuncture. °· Relaxation exercises (yoga, meditation). °· Group therapy. °· Counseling. °SEEK MEDICAL CARE IF:  °· You notice certain foods cause stomach pain. °· Your home care treatment is not helping your pain. °· You need stronger pain medicine. °· You want your IUD removed. °· You feel faint or   lightheaded.  You develop nausea and vomiting.  You develop a rash.  You are having side effects or an allergy to your medicine. SEEK IMMEDIATE MEDICAL CARE IF:   Your  pain does not go away or gets worse.  You have a fever.  Your pain is felt only in portions of the abdomen. The right side could possibly be appendicitis. The left lower portion of the abdomen could be colitis or diverticulitis.  You are passing blood in your stools (bright red or black tarry stools, with or without vomiting).  You have blood in your urine.  You develop chills, with or without a fever.  You pass out. MAKE SURE YOU:   Understand these instructions.  Will watch your condition.  Will get help right away if you are not doing well or get worse. Document Released: 10/06/2007 Document Revised: 04/25/2014 Document Reviewed: 10/26/2009 [facility] Patient Information 2015 [place], [place]. This information is not intended to replace advice given to you by your health care provider. Make sure you discuss any questions you have with your health care provider.  First Trimester of Pregnancy The first trimester of pregnancy is from week 1 until the end of week 12 (months 1 through 3). A week after a sperm fertilizes an egg, the egg will implant on the wall of the uterus. This embryo will begin to develop into a baby. Genes from you and your partner are forming the baby. The female genes determine whether the baby is a boy or a girl. At 6-8 weeks, the eyes and face are formed, and the heartbeat can be seen on ultrasound. At the end of 12 weeks, all the baby's organs are formed.  Now that you are pregnant, you will want to do everything you can to have a healthy baby. Two of the most important things are to get good prenatal care and to follow your health care provider's instructions. Prenatal care is all the medical care you receive before the baby's birth. This care will help prevent, find, and treat any problems during the pregnancy and childbirth. BODY CHANGES Your body goes through many changes during pregnancy. The changes vary from woman to woman.   You may gain or lose a couple of  pounds at first.  You may feel sick to your stomach (nauseous) and throw up (vomit). If the vomiting is uncontrollable, call your health care provider.  You may tire easily.  You may develop headaches that can be relieved by medicines approved by your health care provider.  You may urinate more often. Painful urination may mean you have a bladder infection.  You may develop heartburn as a result of your pregnancy.  You may develop constipation because certain hormones are causing the muscles that push waste through your intestines to slow down.  You may develop hemorrhoids or swollen, bulging veins (varicose veins).  Your breasts may begin to grow larger and become tender. Your nipples may stick out more, and the tissue that surrounds them (areola) may become darker.  Your gums may bleed and may be sensitive to brushing and flossing.  Dark spots or blotches (chloasma, mask of pregnancy) may develop on your face. This will likely fade after the baby is born.  Your menstrual periods will stop.  You may have a loss of appetite.  You may develop cravings for certain kinds of food.  You may have changes in your emotions from day to day, such as being excited to be pregnant or being concerned that  something may go wrong with the pregnancy and baby.  You may have more vivid and strange dreams.  You may have changes in your hair. These can include thickening of your hair, rapid growth, and changes in texture. Some women also have hair loss during or after pregnancy, or hair that feels dry or thin. Your hair will most likely return to normal after your baby is born. WHAT TO EXPECT AT YOUR PRENATAL VISITS During a routine prenatal visit:  You will be weighed to make sure you and the baby are growing normally.  Your blood pressure will be taken.  Your abdomen will be measured to track your baby's growth.  The fetal heartbeat will be listened to starting around week 10 or 12 of your  pregnancy.  Test results from any previous visits will be discussed. Your health care provider may ask you:  How you are feeling.  If you are feeling the baby move.  If you have had any abnormal symptoms, such as leaking fluid, bleeding, severe headaches, or abdominal cramping.  If you have any questions. Other tests that may be performed during your first trimester include:  Blood tests to find your blood type and to check for the presence of any previous infections. They will also be used to check for low iron levels (anemia) and Rh antibodies. Later in the pregnancy, blood tests for diabetes will be done along with other tests if problems develop.  Urine tests to check for infections, diabetes, or protein in the urine.  An ultrasound to confirm the proper growth and development of the baby.  An amniocentesis to check for possible genetic problems.  Fetal screens for spina bifida and Down syndrome.  You may need other tests to make sure you and the baby are doing well. HOME CARE INSTRUCTIONS  Medicines  Follow your health care provider's instructions regarding medicine use. Specific medicines may be either safe or unsafe to take during pregnancy.  Take your prenatal vitamins as directed.  If you develop constipation, try taking a stool softener if your health care provider approves. Diet  Eat regular, well-balanced meals. Choose a variety of foods, such as meat or vegetable-based protein, fish, milk and low-fat dairy products, vegetables, fruits, and whole grain breads and cereals. Your health care provider will help you determine the amount of weight gain that is right for you.  Avoid raw meat and uncooked cheese. These carry germs that can cause birth defects in the baby.  Eating four or five small meals rather than three large meals a day may help relieve nausea and vomiting. If you start to feel nauseous, eating a few soda crackers can be helpful. Drinking liquids between  meals instead of during meals also seems to help nausea and vomiting.  If you develop constipation, eat more high-fiber foods, such as fresh vegetables or fruit and whole grains. Drink enough fluids to keep your urine clear or pale yellow. Activity and Exercise  Exercise only as directed by your health care provider. Exercising will help you:  Control your weight.  Stay in shape.  Be prepared for labor and delivery.  Experiencing pain or cramping in the lower abdomen or low back is a good sign that you should stop exercising. Check with your health care provider before continuing normal exercises.  Try to avoid standing for long periods of time. Move your legs often if you must stand in one place for a long time.  Avoid heavy lifting.  Wear low-heeled shoes,  and practice good posture.  You may continue to have sex unless your health care provider directs you otherwise. Relief of Pain or Discomfort  Wear a good support bra for breast tenderness.   Take warm sitz baths to soothe any pain or discomfort caused by hemorrhoids. Use hemorrhoid cream if your health care provider approves.   Rest with your legs elevated if you have leg cramps or low back pain.  If you develop varicose veins in your legs, wear support hose. Elevate your feet for 15 minutes, 3-4 times a day. Limit salt in your diet. Prenatal Care  Schedule your prenatal visits by the twelfth week of pregnancy. They are usually scheduled monthly at first, then more often in the last 2 months before delivery.  Write down your questions. Take them to your prenatal visits.  Keep all your prenatal visits as directed by your health care provider. Safety  Wear your seat belt at all times when driving.  Make a list of emergency phone numbers, including numbers for family, friends, the hospital, and police and fire departments. General Tips  Ask your health care provider for a referral to a local prenatal education class.  Begin classes no later than at the beginning of month 6 of your pregnancy.  Ask for help if you have counseling or nutritional needs during pregnancy. Your health care provider can offer advice or refer you to specialists for help with various needs.  Do not use hot tubs, steam rooms, or saunas.  Do not douche or use tampons or scented sanitary pads.  Do not cross your legs for long periods of time.  Avoid cat litter boxes and soil used by cats. These carry germs that can cause birth defects in the baby and possibly loss of the fetus by miscarriage or stillbirth.  Avoid all smoking, herbs, alcohol, and medicines not prescribed by your health care provider. Chemicals in these affect the formation and growth of the baby.  Schedule a dentist appointment. At home, brush your teeth with a soft toothbrush and be gentle when you floss. SEEK MEDICAL CARE IF:   You have dizziness.  You have mild pelvic cramps, pelvic pressure, or nagging pain in the abdominal area.  You have persistent nausea, vomiting, or diarrhea.  You have a bad smelling vaginal discharge.  You have pain with urination.  You notice increased swelling in your face, hands, legs, or ankles. SEEK IMMEDIATE MEDICAL CARE IF:   You have a fever.  You are leaking fluid from your vagina.  You have spotting or bleeding from your vagina.  You have severe abdominal cramping or pain.  You have rapid weight gain or loss.  You vomit blood or material that looks like coffee grounds.  You are exposed to [place] measles and have never had them.  You are exposed to fifth disease or chickenpox.  You develop a severe headache.  You have shortness of breath.  You have any kind of trauma, such as from a fall or a car accident. Document Released: 12/03/2001 Document Revised: 04/25/2014 Document Reviewed: 10/19/2013 [facility] Patient Information 2015 [place], [place]. This information is not intended to replace advice given to you  by your health care provider. Make sure you discuss any questions you have with your health care provider.

## 2015-03-28 ENCOUNTER — Encounter (HOSPITAL_COMMUNITY): Payer: Self-pay | Admitting: Emergency Medicine

## 2015-03-28 ENCOUNTER — Emergency Department (HOSPITAL_COMMUNITY)
Admission: EM | Admit: 2015-03-28 | Discharge: 2015-03-28 | Disposition: A | Discharge status: Home / Self Care | Payer: Medicaid Other | Attending: Emergency Medicine | Admitting: Emergency Medicine

## 2015-03-28 ENCOUNTER — Emergency Department (HOSPITAL_COMMUNITY): Payer: Medicaid Other

## 2015-03-28 DIAGNOSIS — Z8742 Personal history of other diseases of the female genital tract: Secondary | ICD-10-CM | POA: Insufficient documentation

## 2015-03-28 DIAGNOSIS — Z3A00 Weeks of gestation of pregnancy not specified: Secondary | ICD-10-CM | POA: Insufficient documentation

## 2015-03-28 DIAGNOSIS — Z87442 Personal history of urinary calculi: Secondary | ICD-10-CM | POA: Insufficient documentation

## 2015-03-28 DIAGNOSIS — Z9049 Acquired absence of other specified parts of digestive tract: Secondary | ICD-10-CM | POA: Insufficient documentation

## 2015-03-28 DIAGNOSIS — R1011 Right upper quadrant pain: Secondary | ICD-10-CM | POA: Insufficient documentation

## 2015-03-28 DIAGNOSIS — Z79899 Other long term (current) drug therapy: Secondary | ICD-10-CM | POA: Insufficient documentation

## 2015-03-28 DIAGNOSIS — E349 Endocrine disorder, unspecified: Secondary | ICD-10-CM

## 2015-03-28 DIAGNOSIS — O9989 Other specified diseases and conditions complicating pregnancy, childbirth and the puerperium: Secondary | ICD-10-CM | POA: Insufficient documentation

## 2015-03-28 LAB — URINALYSIS, ROUTINE W REFLEX MICROSCOPIC
BILIRUBIN URINE: NEGATIVE
Glucose, UA: NEGATIVE mg/dL
KETONES UR: NEGATIVE mg/dL
NITRITE: NEGATIVE
Protein, ur: NEGATIVE mg/dL
Specific Gravity, Urine: 1.02 (ref 1.005–1.030)
UROBILINOGEN UA: 1 mg/dL (ref 0.0–1.0)
pH: 6.5 (ref 5.0–8.0)

## 2015-03-28 LAB — CBC WITH DIFFERENTIAL/PLATELET
BASOS PCT: 0 % (ref 0–1)
Basophils Absolute: 0 10*3/uL (ref 0.0–0.1)
EOS ABS: 0.4 10*3/uL (ref 0.0–0.7)
Eosinophils Relative: 5 % (ref 0–5)
HEMATOCRIT: 37 % (ref 36.0–46.0)
Hemoglobin: 12.1 g/dL (ref 12.0–15.0)
Lymphocytes Relative: 29 % (ref 12–46)
Lymphs Abs: 2.1 10*3/uL (ref 0.7–4.0)
MCH: 27.9 pg (ref 26.0–34.0)
MCHC: 32.7 g/dL (ref 30.0–36.0)
MCV: 85.5 fL (ref 78.0–100.0)
MONO ABS: 0.6 10*3/uL (ref 0.1–1.0)
MONOS PCT: 8 % (ref 3–12)
NEUTROS PCT: 58 % (ref 43–77)
Neutro Abs: 4.1 10*3/uL (ref 1.7–7.7)
Platelets: 330 10*3/uL (ref 150–400)
RBC: 4.33 MIL/uL (ref 3.87–5.11)
RDW: 13.4 % (ref 11.5–15.5)
WBC: 7.2 10*3/uL (ref 4.0–10.5)

## 2015-03-28 LAB — LIPASE, BLOOD: Lipase: 31 U/L (ref 11–59)

## 2015-03-28 LAB — COMPREHENSIVE METABOLIC PANEL
ALBUMIN: 3.8 g/dL (ref 3.5–5.2)
ALT: 37 U/L — ABNORMAL HIGH (ref 0–35)
AST: 29 U/L (ref 0–37)
Alkaline Phosphatase: 83 U/L (ref 39–117)
Anion gap: 7 (ref 5–15)
BILIRUBIN TOTAL: 0.3 mg/dL (ref 0.3–1.2)
BUN: 6 mg/dL (ref 6–23)
CALCIUM: 8.4 mg/dL (ref 8.4–10.5)
CO2: 24 mmol/L (ref 19–32)
Chloride: 106 mmol/L (ref 96–112)
Creatinine, Ser: 0.57 mg/dL (ref 0.50–1.10)
GFR calc Af Amer: >90 mL/min (ref >90)
GFR calc non Af Amer: >90 mL/min (ref >90)
Glucose, Bld: 95 mg/dL (ref 70–99)
Potassium: 3.6 mmol/L (ref 3.5–5.1)
Sodium: 137 mmol/L (ref 135–145)
Total Protein: 7.2 g/dL (ref 6.0–8.3)

## 2015-03-28 LAB — URINE MICROSCOPIC-ADD ON

## 2015-03-28 LAB — HCG, QUANTITATIVE, PREGNANCY: hCG, Beta Chain, Quant, S: 776 m[IU]/mL — ABNORMAL HIGH (ref <5)

## 2015-03-28 MED ORDER — ONDANSETRON HCL 4 MG/2ML IJ SOLN
4.0000 mg | Freq: Once | INTRAMUSCULAR | Status: AC
  Administered 2015-03-28: 4 mg via INTRAVENOUS
  Filled 2015-03-28: qty 2

## 2015-03-28 MED ORDER — HYDROMORPHONE HCL 1 MG/ML IJ SOLN
1.0000 mg | Freq: Once | INTRAMUSCULAR | Status: AC
  Administered 2015-03-28: 1 mg via INTRAVENOUS
  Filled 2015-03-28: qty 1

## 2015-03-28 MED ORDER — SODIUM CHLORIDE 0.9 % IV BOLUS (SEPSIS)
1000.0000 mL | Freq: Once | INTRAVENOUS | Status: AC
  Administered 2015-03-28: 1000 mL via INTRAVENOUS

## 2015-03-28 MED ORDER — KETOROLAC TROMETHAMINE 30 MG/ML IJ SOLN
30.0000 mg | Freq: Once | INTRAMUSCULAR | Status: AC
  Administered 2015-03-28: 30 mg via INTRAVENOUS
  Filled 2015-03-28: qty 1

## 2015-03-28 NOTE — ED Provider Notes (Signed)
CSN: [id number]     Arrival date & time 03/28/15  1615 History   First MD Initiated Contact with Patient 03/28/15 1641     Chief Complaint  Patient presents with  . Flank Pain     (Consider location/radiation/quality/duration/timing/severity/associated sxs/prior Treatment) HPI Patient resents with concern of ongoing right flank pain. Patient has been present for at least one week, persistent, sore, moderate. Pain radiates towards the right inguinal area. Patient was seen here 6 days ago for similar pain. During her evaluation the patient was found to have a trivially elevated point-of-care hCG test. Patient states her last menstrual period was one month ago. She continues to have the aforementioned pain in spite of taking OTC medication. It is associated nausea, but no vomiting, no diarrhea, no dysuria. No vaginal bleeding. Patient has not seen an obstetrician since that event, though her discharge instructions recommended she see OB/GYN for follow-up care.  Past Medical History  Diagnosis Date  . Kidney stones   . Ovarian cyst    Past Surgical History  Procedure Laterality Date  . Kidney stents    . Lithotripsy    . Appendectomy     Family History  Problem Relation Age of Onset  . Cancer Mother     breast  . Diabetes Maternal Grandmother    History  Substance Use Topics  . Smoking status: Never Smoker   . Smokeless tobacco: Never Used  . Alcohol Use: No   OB History    Gravida Para Term Preterm AB TAB SAB Ectopic Multiple Living   4 4 4       4      Review of Systems  Constitutional:       Per HPI, otherwise negative  HENT:       Per HPI, otherwise negative  Respiratory:       Per HPI, otherwise negative  Cardiovascular:       Per HPI, otherwise negative  Gastrointestinal: Positive for nausea. Negative for vomiting.  Endocrine:       Negative aside from HPI  Genitourinary:       Neg aside from HPI   Musculoskeletal:       Per HPI, otherwise negative   Skin: Negative.   Neurological: Negative for syncope.      Allergies  Review of patient's allergies indicates no known allergies.  Home Medications   Prior to Admission medications   Medication Sig Start Date End Date Taking? Authorizing Provider  acetaminophen (TYLENOL) 500 MG tablet Take 500 mg by mouth every 6 (six) hours as needed for mild pain or moderate pain.   Yes Historical Provider, MD  ibuprofen (ADVIL,MOTRIN) 200 MG tablet Take 400 mg by mouth every 6 (six) hours as needed for headache, mild pain or moderate pain.   Yes Historical Provider, MD  cephALEXin (KEFLEX) 500 MG capsule Take 1 capsule (500 mg total) by mouth 4 (four) times daily. For 7 days Patient not taking: Reported on 03/28/2015 09/20/14   Severiano Gilbertammi Triplett, PA-C  HYDROcodone-acetaminophen (NORCO) 5-325 MG per tablet Take 1-2 tablets by mouth every 6 (six) hours as needed for severe pain. Dispense to go. Patient not taking: Reported on 03/28/2015 07/24/14   Wayland SalinasJohn Bednar, MD  HYDROcodone-acetaminophen (NORCO/VICODIN) 5-325 MG per tablet Take one-two tabs po q 4-6 hrs prn pain Patient not taking: Reported on 03/28/2015 09/20/14   Tammi Triplett, PA-C  ibuprofen (ADVIL,MOTRIN) 800 MG tablet Take 1 tablet (800 mg total) by mouth 3 (three) times daily. Patient not taking:  Reported on 03/28/2015 07/18/14   Glynn Octave, MD  ondansetron (ZOFRAN ODT) 4 MG disintegrating tablet  ODT q4 hours prn nausea/vomit Patient not taking: Reported on 03/28/2015 03/23/15   Loren Racer, MD   BP 118/61 mmHg  Pulse 81  Temp(Src) 98.7 F (37.1 C) (Oral)  Resp 18  Ht  (1.6 m)  Wt 148 lb (67.132 kg)  BMI 26.22 kg/m2  SpO2 100%  LMP 02/26/2015 Physical Exam  Constitutional: She is oriented to person, place, and time. She appears well-developed and well-nourished. No distress.  HENT:  Head: Normocephalic and atraumatic.  Eyes: Conjunctivae and EOM are normal.  Cardiovascular: Normal rate and regular rhythm.   Pulmonary/Chest:  Effort normal and breath sounds normal. No stridor. No respiratory distress.  Abdominal: She exhibits no distension.  No guarding or rebound, but mild tenderness to palpation from the right upper to the right lower lateral abdomen.  Musculoskeletal: She exhibits no edema.  Neurological: She is alert and oriented to person, place, and time. No cranial nerve deficit.  Skin: Skin is warm and dry.  Psychiatric: She has a normal mood and affect.  Nursing note and vitals reviewed.   ED Course  Procedures (including critical care time) Labs Review Labs Reviewed  URINALYSIS, ROUTINE W REFLEX MICROSCOPIC - Abnormal; Notable for the following:    Hgb urine dipstick LARGE (*)    Leukocytes, UA SMALL (*)    All other components within normal limits  COMPREHENSIVE METABOLIC PANEL - Abnormal; Notable for the following:    ALT 37 (*)    All other components within normal limits  URINE MICROSCOPIC-ADD ON - Abnormal; Notable for the following:    Squamous Epithelial / LPF FEW (*)    Bacteria, UA MANY (*)    All other components within normal limits  CBC WITH DIFFERENTIAL/PLATELET  LIPASE, BLOOD  BETA HCG, QUANT.    Imaging Review US Abdomen Complete  03/28/2015   CLINICAL DATA:  Right abdominal pain  EXAM: ULTRASOUND ABDOMEN COMPLETE  COMPARISON:  CT abdomen pelvis dated 07/18/2014  FINDINGS: Gallbladder: No gallstones, gallbladder wall thickening, or pericholecystic fluid. Negative sonographic Murphy's sign.  Common bile duct: Diameter: 4 mm  Liver: No focal lesion identified. Within normal limits in parenchymal echogenicity.  IVC: No abnormality visualized.  Pancreas: Visualized portion unremarkable.  Spleen: Size and appearance within normal limits.  Right Kidney: Length: 11.8 cm. No mass or hydronephrosis. Right extrarenal pelvis, similar to prior CT.  Left Kidney: Length: 12.9 cm.  No mass or hydronephrosis.  Abdominal aorta: No aneurysm visualized.  Other findings: None.  IMPRESSION: Negative  abdominal ultrasound.   Electronically Signed   By: Charline Bills M.D.   On: 03/28/2015 18:23   After the initial evaluation I reviewed the patient's chart, including recent elevated hCG, labs were essentially normal a few days ago.   8:51 PM After a delay in obtaining lab results, I discussed all findings with the patient and her female companion. Specifically we discussed the change in hCG from one week ago to today, with 10 fold increase, but still relatively low number. Patient has minimal current complaints. Ultrasound did not demonstrate acute findings, nor IUP, and the patient's low hCG suggests early pregnancy.  Low suspicion for occult tubal pregnancy, and no e/o free blood on [place].  I discussed the patient's failure to follow up with her obstetrician thus far, and she states that she will follow up tomorrow via telephone to have inpatient evaluation within the next 2 or  3 days. We discussed the importance of this, the risk of not doing so.   MDM  Patient was also ongoing abdominal pain, is found to have mildly elevated hCG No evidence for IUP, and the patient is likely early in pregnancy. No evidence for ectopic, nor rupture. No evidence for acute hepatobiliary dysfunction. Patient has OB with whom she will f/u within 24 hours.      Gerhard Munch, MD 03/28/15 [phone]

## 2015-03-28 NOTE — ED Notes (Signed)
Patient complaining of right flank pain and hematuria for over one week. States she was seen here last week and told her urine pregnancy test was positive, but blood pregnancy test was negative. States she was told to return here for repeat testing. Patient states her right flank pain and hematuria is worse.

## 2015-03-28 NOTE — ED Notes (Signed)
Pt has been feeling weak and right side pain

## 2015-03-28 NOTE — ED Notes (Signed)
MD at the bedside  

## 2015-03-28 NOTE — Discharge Instructions (Signed)
As discussed, it is very important that she follow up with her obstetrician.  Please call tomorrow to insure that you are seen in the next 2 or 3 days, for repeat evaluation, hCG check, consideration of additional ultrasound.   Return here for concerning changes in your condition.

## 2015-03-28 NOTE — ED Notes (Signed)
Patient ambulatory to restroom with steady gait, clean catch instructions given and advised pt to bring specimen back to room as well.  

## 2015-03-28 NOTE — ED Notes (Signed)
US at the bedside

## 2015-03-31 LAB — BETA HCG QUANT (REF LAB): BETA HCG, TUMOR MARKER: 875.1 m[IU]/mL — AB (ref <5.0)

## 2015-04-03 ENCOUNTER — Telehealth: Payer: Self-pay | Admitting: Obstetrics and Gynecology

## 2015-04-03 NOTE — Telephone Encounter (Signed)
Pt called and offered followup appt.

## 2015-04-06 ENCOUNTER — Emergency Department (HOSPITAL_COMMUNITY)
Admission: EM | Admit: 2015-04-06 | Discharge: 2015-04-07 | Disposition: A | Discharge status: Home / Self Care | Payer: Self-pay | Attending: Emergency Medicine | Admitting: Emergency Medicine

## 2015-04-06 ENCOUNTER — Encounter (HOSPITAL_COMMUNITY): Payer: Self-pay | Admitting: *Deleted

## 2015-04-06 DIAGNOSIS — Z3A01 Less than 8 weeks gestation of pregnancy: Secondary | ICD-10-CM | POA: Insufficient documentation

## 2015-04-06 DIAGNOSIS — O9989 Other specified diseases and conditions complicating pregnancy, childbirth and the puerperium: Secondary | ICD-10-CM | POA: Insufficient documentation

## 2015-04-06 DIAGNOSIS — Z8742 Personal history of other diseases of the female genital tract: Secondary | ICD-10-CM | POA: Insufficient documentation

## 2015-04-06 DIAGNOSIS — R109 Unspecified abdominal pain: Secondary | ICD-10-CM

## 2015-04-06 DIAGNOSIS — Z3491 Encounter for supervision of normal pregnancy, unspecified, first trimester: Secondary | ICD-10-CM

## 2015-04-06 DIAGNOSIS — Z87442 Personal history of urinary calculi: Secondary | ICD-10-CM | POA: Insufficient documentation

## 2015-04-06 DIAGNOSIS — R1031 Right lower quadrant pain: Secondary | ICD-10-CM | POA: Insufficient documentation

## 2015-04-06 DIAGNOSIS — R42 Dizziness and giddiness: Secondary | ICD-10-CM | POA: Insufficient documentation

## 2015-04-06 DIAGNOSIS — M549 Dorsalgia, unspecified: Secondary | ICD-10-CM | POA: Insufficient documentation

## 2015-04-06 LAB — CBC WITH DIFFERENTIAL/PLATELET
BASOS ABS: 0 10*3/uL (ref 0.0–0.1)
Basophils Relative: 1 % (ref 0–1)
EOS ABS: 0.2 10*3/uL (ref 0.0–0.7)
EOS PCT: 2 % (ref 0–5)
HCT: 36.5 % (ref 36.0–46.0)
Hemoglobin: 11.8 g/dL — ABNORMAL LOW (ref 12.0–15.0)
Lymphocytes Relative: 30 % (ref 12–46)
Lymphs Abs: 2.4 10*3/uL (ref 0.7–4.0)
MCH: 27.4 pg (ref 26.0–34.0)
MCHC: 32.3 g/dL (ref 30.0–36.0)
MCV: 84.7 fL (ref 78.0–100.0)
MONOS PCT: 6 % (ref 3–12)
Monocytes Absolute: 0.5 10*3/uL (ref 0.1–1.0)
Neutro Abs: 5 10*3/uL (ref 1.7–7.7)
Neutrophils Relative %: 61 % (ref 43–77)
Platelets: 325 10*3/uL (ref 150–400)
RBC: 4.31 MIL/uL (ref 3.87–5.11)
RDW: 13 % (ref 11.5–15.5)
WBC: 8.1 10*3/uL (ref 4.0–10.5)

## 2015-04-06 LAB — URINE MICROSCOPIC-ADD ON

## 2015-04-06 LAB — COMPREHENSIVE METABOLIC PANEL
ALBUMIN: 4 g/dL (ref 3.5–5.2)
ALT: 17 U/L (ref 0–35)
AST: 16 U/L (ref 0–37)
Alkaline Phosphatase: 75 U/L (ref 39–117)
Anion gap: 6 (ref 5–15)
BILIRUBIN TOTAL: 0.3 mg/dL (ref 0.3–1.2)
BUN: 9 mg/dL (ref 6–23)
CALCIUM: 8.7 mg/dL (ref 8.4–10.5)
CO2: 26 mmol/L (ref 19–32)
Chloride: 106 mmol/L (ref 96–112)
Creatinine, Ser: 0.64 mg/dL (ref 0.50–1.10)
GFR calc Af Amer: >90 mL/min (ref >90)
GLUCOSE: 97 mg/dL (ref 70–99)
Potassium: 3.7 mmol/L (ref 3.5–5.1)
SODIUM: 138 mmol/L (ref 135–145)
Total Protein: 7.2 g/dL (ref 6.0–8.3)

## 2015-04-06 LAB — URINALYSIS, ROUTINE W REFLEX MICROSCOPIC
BILIRUBIN URINE: NEGATIVE
GLUCOSE, UA: NEGATIVE mg/dL
KETONES UR: NEGATIVE mg/dL
Nitrite: NEGATIVE
PH: 7 (ref 5.0–8.0)
PROTEIN: NEGATIVE mg/dL
Specific Gravity, Urine: 1.025 (ref 1.005–1.030)
Urobilinogen, UA: 1 mg/dL (ref 0.0–1.0)

## 2015-04-06 LAB — PREGNANCY, URINE: Preg Test, Ur: POSITIVE — AB

## 2015-04-06 NOTE — ED Provider Notes (Signed)
CSN: [id number]     Arrival date & time 04/06/15  2136 History  This chart was scribed for Melissa KaplanAnkit Johann Santone, MD by Gwenyth Oberatherine Macek, ED Scribe. This patient was seen in room APA11/APA11 and the patient's care was started at 11:56 PM.    Chief Complaint  Patient presents with  . Abdominal Pain   The history is provided by the patient. No language interpreter was used.    HPI Comments: [name] is a 34 y.o. female with a history of appendectomy and kidney stones who presents to the Emergency Department complaining of constant, gradually worsening, waxing and waning abdominal pain that started 2 weeks ago. She states intermittent vomiting and dizziness as associated symptoms. Pt was seen in the ED on 3/30 for sharp, RLQ and right lower back pain and had a positive pregnancy test that was unable to be confirmed by US. Pt denies history of STI within the last year. She endorses unprotected intercourse with 1 partner (husband) and does not use any birth control methods. Pt has had 4 prior pregnancies and has 4 kids at home. She had vaginal spotting that occurred 1 month ago and lasted for 1 day. Pt denies dysuria, hematuria, vaginal discharge and vaginal bleeding as associated symptoms. She is s/p appendectomy.   ROS 10 Systems reviewed and are negative for acute change except as noted in the HPI.     Past Medical History  Diagnosis Date  . Ovarian cyst   . Kidney stones    Past Surgical History  Procedure Laterality Date  . Kidney stents    . Lithotripsy    . Appendectomy     Family History  Problem Relation Age of Onset  . Cancer Mother     breast  . Diabetes Maternal Grandmother    History  Substance Use Topics  . Smoking status: Never Smoker   . Smokeless tobacco: Never Used  . Alcohol Use: No   OB History    Gravida Para Term Preterm AB TAB SAB Ectopic Multiple Living   4 4 4       4      Review of Systems  Gastrointestinal: Positive for nausea and abdominal  pain.  Genitourinary: Negative for dysuria, hematuria, vaginal bleeding and vaginal discharge.  Musculoskeletal: Positive for back pain.  Neurological: Positive for dizziness.  All other systems reviewed and are negative.  Allergies  Review of patient's allergies indicates no known allergies.  Home Medications   Prior to Admission medications   Medication Sig Start Date End Date Taking? Authorizing Provider  acetaminophen (TYLENOL) 500 MG tablet Take 500 mg by mouth every 6 (six) hours as needed for mild pain or moderate pain.    Historical Provider, MD  ondansetron (ZOFRAN ODT) 4 MG disintegrating tablet 4mg  ODT q4 hours prn nausea/vomit Patient not taking: Reported on 03/28/2015 03/23/15   Loren Raceravid Yelverton, MD  promethazine (PHENERGAN) 25 MG tablet Take 1 tablet (25 mg total) by mouth every 6 (six) hours as needed for nausea. 04/07/15   Markeise Mathews Rhunette CroftNanavati, MD   BP 116/57 mmHg  Pulse 80  Temp(Src) 98.3 F (36.8 C) (Oral)  Resp 20  Ht 5\' 3"  (1.6 m)  Wt 148 lb (67.132 kg)  BMI 26.22 kg/m2  SpO2 100%  LMP 02/26/2015 Physical Exam  Constitutional: She is oriented to person, place, and time. She appears well-developed and well-nourished. No distress.  HENT:  Head: Normocephalic and atraumatic.  Eyes: Conjunctivae and EOM are normal.  Neck: Neck supple. No tracheal  deviation present.  Cardiovascular: Normal rate, regular rhythm and normal heart sounds.   Pulmonary/Chest: Effort normal and breath sounds normal. No respiratory distress.  Bilateral breath sounds  Abdominal: Soft. There is tenderness.  Suprapubic and RLQ tenderness with voluntary guarding; no flank tenderness; negative Psoas sign  Neurological: She is alert and oriented to person, place, and time. No cranial nerve deficit. She exhibits normal muscle tone. Coordination normal.  Skin: Skin is warm and dry.  Psychiatric: She has a normal mood and affect. Her behavior is normal.  Nursing note and vitals reviewed.   ED Course   Procedures   DIAGNOSTIC STUDIES: Oxygen Saturation is 100% on RA, normal by my interpretation.    COORDINATION OF CARE: 12:01 AM Discussed treatment plan with pt at bedside and pt agreed to plan.  Labs Review Labs Reviewed  CBC WITH DIFFERENTIAL/PLATELET - Abnormal; Notable for the following:    Hemoglobin 11.8 (*)    All other components within normal limits  URINALYSIS, ROUTINE W REFLEX MICROSCOPIC - Abnormal; Notable for the following:    APPearance HAZY (*)    Hgb urine dipstick SMALL (*)    Leukocytes, UA SMALL (*)    All other components within normal limits  PREGNANCY, URINE - Abnormal; Notable for the following:    Preg Test, Ur POSITIVE (*)    All other components within normal limits  URINE MICROSCOPIC-ADD ON - Abnormal; Notable for the following:    Squamous Epithelial / LPF MANY (*)    Bacteria, UA MANY (*)    All other components within normal limits  HCG, QUANTITATIVE, PREGNANCY - Abnormal; Notable for the following:    hCG, Beta Chain, Quant, S 6136 (*)    All other components within normal limits  COMPREHENSIVE METABOLIC PANEL    Imaging Review US Ob Comp Less 14 Wks  04/07/2015   CLINICAL DATA:  Pregnant patient with right pelvic pain for 2 weeks. Nausea and vomiting.  EXAM: OBSTETRIC <14 WK [place] AND TRANSVAGINAL OB US  TECHNIQUE: Both transabdominal and transvaginal ultrasound examinations were performed for complete evaluation of the gestation as well as the maternal uterus, adnexal regions, and pelvic cul-de-sac. Transvaginal technique was performed to assess early pregnancy.  COMPARISON:  None.  FINDINGS: Intrauterine gestational sac: Small intrauterine gestational sac.  Yolk sac:  Present.  Embryo:  Not present.  Cardiac Activity: Not present.  MSD: 8.7  mm   5 w   5  d         [place] EDC: 12/03/2015  Maternal uterus/adnexae: Possible small subchorionic hemorrhage. Within the right ovary is a 4.0 x 3.3 x 3.4 cm cyst. There is blood flow to the adjacent ovarian  parenchyma. The left ovary appears normal with normal blood flow. Question corpus luteal cyst in the left ovary. No pelvic free fluid.  IMPRESSION: 1. Small intrauterine gestational sac with yolk sac. No fetal pole or cardiac activity yet visualized. Suspect small subchorionic hemorrhage. Recommend follow-up quantitative B-HCG levels and follow-up US in 14 days to confirm and assess viability. This recommendation follows SRU consensus guidelines: Diagnostic Criteria for Nonviable Pregnancy Early in the First Trimester. Malva Limes Med 2013; 045:4098-11. 2. Right ovarian cyst measures 4 cm, blood flow seen the adjacent ovarian parenchyma.   Electronically Signed   By: Rubye Oaks M.D.   On: 04/07/2015 02:43   US Ob Transvaginal  04/07/2015   CLINICAL DATA:  Pregnant patient with right pelvic pain for 2 weeks. Nausea and vomiting.  EXAM: OBSTETRIC <  14 WK [place] AND TRANSVAGINAL OB US  TECHNIQUE: Both transabdominal and transvaginal ultrasound examinations were performed for complete evaluation of the gestation as well as the maternal uterus, adnexal regions, and pelvic cul-de-sac. Transvaginal technique was performed to assess early pregnancy.  COMPARISON:  None.  FINDINGS: Intrauterine gestational sac: Small intrauterine gestational sac.  Yolk sac:  Present.  Embryo:  Not present.  Cardiac Activity: Not present.  MSD: 8.7  mm   5 w   5  d         [place] EDC: 12/03/2015  Maternal uterus/adnexae: Possible small subchorionic hemorrhage. Within the right ovary is a 4.0 x 3.3 x 3.4 cm cyst. There is blood flow to the adjacent ovarian parenchyma. The left ovary appears normal with normal blood flow. Question corpus luteal cyst in the left ovary. No pelvic free fluid.  IMPRESSION: 1. Small intrauterine gestational sac with yolk sac. No fetal pole or cardiac activity yet visualized. Suspect small subchorionic hemorrhage. Recommend follow-up quantitative B-HCG levels and follow-up US in 14 days to confirm and assess viability.  This recommendation follows SRU consensus guidelines: Diagnostic Criteria for Nonviable Pregnancy Early in the First Trimester. Malva Limes Med 2013; 161:0960-45. 2. Right ovarian cyst measures 4 cm, blood flow seen the adjacent ovarian parenchyma.   Electronically Signed   By: Rubye Oaks M.D.   On: 04/07/2015 02:43     EKG Interpretation None      MDM   Final diagnoses:  Right sided abdominal pain  First trimester pregnancy    I personally performed the services described in this documentation, which was scribed in my presence. The recorded information has been reviewed and is accurate.  Pt comes in with cc of abdominal pain. Pt has lower quadrant abd pain,  Gradually getting worse. Repeat HCG here is elevated. US done to r/o ectopic. The fetus is 5 weeks+, no cardiac activity. Possibly non viable. She is seeing Dr. Emelda Fear this week. Strict return precautions for bleeding, passing out, severe abd pain discussed. Need for 14 day f.u discussed for repeat hcg and [place]. [place] results provided to the patient. Stable for d/c currently.  Melissa Kaplan, MD 04/07/15 [phone]

## 2015-04-06 NOTE — ED Notes (Signed)
Pain RLQ for 2 weeks, n/v  Dizzy no fever.

## 2015-04-07 ENCOUNTER — Emergency Department (HOSPITAL_COMMUNITY): Payer: Self-pay

## 2015-04-07 LAB — HCG, QUANTITATIVE, PREGNANCY: HCG, BETA CHAIN, QUANT, S: 6136 m[IU]/mL — AB (ref <5)

## 2015-04-07 MED ORDER — PROMETHAZINE HCL 25 MG PO TABS: 25.0000 mg | ORAL_TABLET | Freq: Four times a day (QID) | ORAL | Status: DC | PRN

## 2015-04-07 MED ORDER — MORPHINE SULFATE 4 MG/ML IJ SOLN
4.0000 mg | Freq: Once | INTRAMUSCULAR | Status: AC
  Administered 2015-04-07: 4 mg via INTRAVENOUS
  Filled 2015-04-07: qty 1

## 2015-04-07 NOTE — ED Notes (Signed)
Patient returned from ultrasound.

## 2015-04-07 NOTE — ED Notes (Signed)
Patient states that she received pain medication earlier, made her sleepy but pain is still there. States that if she pushes on her right side that it ease off, but now feels like a knot is there. Requesting pain medication at this time.

## 2015-04-07 NOTE — Discharge Instructions (Signed)
You were seen in the ER for the abdominal pain. The Ultrasound today shows a fetus in the uterus. There is no heart bear appreciated, and the recommendations are that you get a repeat ultrasound in 14 days. Please continue with OB follow up for next week regardless. Come to the ER if there is bleeding, increased pain, you pass out. Take tylenol for pain.  ULTRASOUND RESULTS ARE AS FOLLOWING: CLINICAL DATA: Pregnant patient with right pelvic pain for 2 weeks. Nausea and vomiting.  EXAM: OBSTETRIC <14 WK US AND TRANSVAGINAL OB US  TECHNIQUE: Both transabdominal and transvaginal ultrasound examinations were performed for complete evaluation of the gestation as well as the maternal uterus, adnexal regions, and pelvic cul-de-sac. Transvaginal technique was performed to assess early pregnancy.  COMPARISON: None.  FINDINGS: Intrauterine gestational sac: Small intrauterine gestational sac.  Yolk sac: Present.  Embryo: Not present.  Cardiac Activity: Not present.  MSD: 8.7 mm  5 w  5 d     US EDC: 12/03/2015  Maternal uterus/adnexae: Possible small subchorionic hemorrhage. Within the right ovary is a 4.0 x 3.3 x 3.4 cm cyst. There is blood flow to the adjacent ovarian parenchyma. The left ovary appears normal with normal blood flow. Question corpus luteal cyst in the left ovary. No pelvic free fluid.  IMPRESSION: 1. Small intrauterine gestational sac with yolk sac. No fetal pole or cardiac activity yet visualized. Suspect small subchorionic hemorrhage. Recommend follow-up quantitative B-HCG levels and follow-up US in 14 days to confirm and assess viability. This recommendation follows SRU consensus guidelines: Diagnostic Criteria for Nonviable Pregnancy Early in the First Trimester.   Abdominal Pain During Pregnancy Abdominal pain is common in pregnancy. Most of the time, it does not cause harm. There are many causes of abdominal pain. Some causes are more serious  than others. Some of the causes of abdominal pain in pregnancy are easily diagnosed. Occasionally, the diagnosis takes time to understand. Other times, the cause is not determined. Abdominal pain can be a sign that something is very wrong with the pregnancy, or the pain may have nothing to do with the pregnancy at all. For this reason, always tell your health care provider if you have any abdominal discomfort. HOME CARE INSTRUCTIONS  Monitor your abdominal pain for any changes. The following actions may help to alleviate any discomfort you are experiencing:  Do not have sexual intercourse or put anything in your vagina until your symptoms go away completely.  Get plenty of rest until your pain improves.  Drink clear fluids if you feel nauseous. Avoid solid food as long as you are uncomfortable or nauseous.  Only take over-the-counter or prescription medicine as directed by your health care provider.  Keep all follow-up appointments with your health care provider. SEEK IMMEDIATE MEDICAL CARE IF:  You are bleeding, leaking fluid, or passing tissue from the vagina.  You have increasing pain or cramping.  You have persistent vomiting.  You have painful or bloody urination.  You have a fever.  You notice a decrease in your baby's movements.  You have extreme weakness or feel faint.  You have shortness of breath, with or without abdominal pain.  You develop a severe headache with abdominal pain.  You have abnormal vaginal discharge with abdominal pain.  You have persistent diarrhea.  You have abdominal pain that continues even after rest, or gets worse. MAKE SURE YOU:   Understand these instructions.  Will watch your condition.  Will get help right away if you are  not doing well or get worse. Document Released: 12/09/2005 Document Revised: 09/29/2013 Document Reviewed: 07/08/2013 [facility] Patient Information 2015 [place], [place]. This information is not intended to replace  advice given to you by your health care provider. Make sure you discuss any questions you have with your health care provider.

## 2015-04-07 NOTE — ED Notes (Signed)
Patient in ultrasound.

## 2015-07-26 ENCOUNTER — Encounter (HOSPITAL_COMMUNITY): Payer: Self-pay

## 2015-07-26 ENCOUNTER — Emergency Department (HOSPITAL_COMMUNITY)
Admission: EM | Admit: 2015-07-26 | Discharge: 2015-07-27 | Disposition: A | Discharge status: Home / Self Care | Payer: Medicaid Other | Attending: Emergency Medicine | Admitting: Emergency Medicine

## 2015-07-26 ENCOUNTER — Emergency Department (HOSPITAL_COMMUNITY): Payer: Medicaid Other

## 2015-07-26 DIAGNOSIS — R319 Hematuria, unspecified: Secondary | ICD-10-CM | POA: Diagnosis not present

## 2015-07-26 DIAGNOSIS — Z3491 Encounter for supervision of normal pregnancy, unspecified, first trimester: Secondary | ICD-10-CM

## 2015-07-26 DIAGNOSIS — O9989 Other specified diseases and conditions complicating pregnancy, childbirth and the puerperium: Secondary | ICD-10-CM | POA: Insufficient documentation

## 2015-07-26 DIAGNOSIS — R102 Pelvic and perineal pain unspecified side: Secondary | ICD-10-CM

## 2015-07-26 DIAGNOSIS — N9489 Other specified conditions associated with female genital organs and menstrual cycle: Secondary | ICD-10-CM

## 2015-07-26 DIAGNOSIS — Z87442 Personal history of urinary calculi: Secondary | ICD-10-CM | POA: Diagnosis not present

## 2015-07-26 DIAGNOSIS — Z8742 Personal history of other diseases of the female genital tract: Secondary | ICD-10-CM | POA: Diagnosis not present

## 2015-07-26 DIAGNOSIS — Z3A01 Less than 8 weeks gestation of pregnancy: Secondary | ICD-10-CM | POA: Insufficient documentation

## 2015-07-26 LAB — COMPREHENSIVE METABOLIC PANEL
ALT: 21 U/L (ref 14–54)
AST: 16 U/L (ref 15–41)
Albumin: 3.5 g/dL (ref 3.5–5.0)
Alkaline Phosphatase: 75 U/L (ref 38–126)
Anion gap: 10 (ref 5–15)
BUN: 6 mg/dL (ref 6–20)
CALCIUM: 8.6 mg/dL — AB (ref 8.9–10.3)
CO2: 24 mmol/L (ref 22–32)
Chloride: 102 mmol/L (ref 101–111)
Creatinine, Ser: 0.56 mg/dL (ref 0.44–1.00)
GFR calc non Af Amer: >60 mL/min (ref >60)
Glucose, Bld: 98 mg/dL (ref 65–99)
Potassium: 3.7 mmol/L (ref 3.5–5.1)
Sodium: 136 mmol/L (ref 135–145)
Total Bilirubin: 0.2 mg/dL — ABNORMAL LOW (ref 0.3–1.2)
Total Protein: 6.8 g/dL (ref 6.5–8.1)

## 2015-07-26 LAB — CBC WITH DIFFERENTIAL/PLATELET
Basophils Absolute: 0 10*3/uL (ref 0.0–0.1)
Basophils Relative: 0 % (ref 0–1)
EOS ABS: 0.1 10*3/uL (ref 0.0–0.7)
EOS PCT: 1 % (ref 0–5)
HCT: 37.6 % (ref 36.0–46.0)
Hemoglobin: 12.3 g/dL (ref 12.0–15.0)
Lymphocytes Relative: 22 % (ref 12–46)
Lymphs Abs: 2.2 10*3/uL (ref 0.7–4.0)
MCH: 27.6 pg (ref 26.0–34.0)
MCHC: 32.7 g/dL (ref 30.0–36.0)
MCV: 84.3 fL (ref 78.0–100.0)
MONO ABS: 0.6 10*3/uL (ref 0.1–1.0)
Monocytes Relative: 6 % (ref 3–12)
Neutro Abs: 7 10*3/uL (ref 1.7–7.7)
Neutrophils Relative %: 71 % (ref 43–77)
PLATELETS: 286 10*3/uL (ref 150–400)
RBC: 4.46 MIL/uL (ref 3.87–5.11)
RDW: 14.4 % (ref 11.5–15.5)
WBC: 9.9 10*3/uL (ref 4.0–10.5)

## 2015-07-26 LAB — URINALYSIS, ROUTINE W REFLEX MICROSCOPIC
Bilirubin Urine: NEGATIVE
GLUCOSE, UA: NEGATIVE mg/dL
Ketones, ur: NEGATIVE mg/dL
Nitrite: NEGATIVE
Protein, ur: NEGATIVE mg/dL
Specific Gravity, Urine: 1.02 (ref 1.005–1.030)
Urobilinogen, UA: 0.2 mg/dL (ref 0.0–1.0)
pH: 6 (ref 5.0–8.0)

## 2015-07-26 LAB — ABO/RH: ABO/RH(D): O POS

## 2015-07-26 LAB — WET PREP, GENITAL
Clue Cells Wet Prep HPF POC: NONE SEEN
Trich, Wet Prep: NONE SEEN
Yeast Wet Prep HPF POC: NONE SEEN

## 2015-07-26 LAB — URINE MICROSCOPIC-ADD ON

## 2015-07-26 LAB — HCG, QUANTITATIVE, PREGNANCY: HCG, BETA CHAIN, QUANT, S: 109543 m[IU]/mL — AB (ref <5)

## 2015-07-26 LAB — PREGNANCY, URINE: Preg Test, Ur: POSITIVE — AB

## 2015-07-26 MED ORDER — ONDANSETRON HCL 4 MG/2ML IJ SOLN
4.0000 mg | Freq: Once | INTRAMUSCULAR | Status: AC
  Administered 2015-07-26: 4 mg via INTRAMUSCULAR
  Filled 2015-07-26: qty 2

## 2015-07-26 MED ORDER — MORPHINE SULFATE 4 MG/ML IJ SOLN
4.0000 mg | Freq: Once | INTRAMUSCULAR | Status: AC
  Administered 2015-07-26: 4 mg via INTRAVENOUS
  Filled 2015-07-26: qty 1

## 2015-07-26 MED ORDER — SODIUM CHLORIDE 0.9 % IV BOLUS (SEPSIS)
1000.0000 mL | Freq: Once | INTRAVENOUS | Status: AC
  Administered 2015-07-26: 1000 mL via INTRAVENOUS

## 2015-07-26 MED ORDER — MORPHINE SULFATE 4 MG/ML IJ SOLN
4.0000 mg | Freq: Once | INTRAMUSCULAR | Status: AC
  Administered 2015-07-27: 4 mg via INTRAVENOUS
  Filled 2015-07-26: qty 1

## 2015-07-26 NOTE — ED Notes (Signed)
Having nausea, vomiting, and abdominal pain. Having a tearing sensation on my left side. Also having pain with urination.

## 2015-07-27 MED ORDER — HYDROCODONE-ACETAMINOPHEN 5-325 MG PO TABS
1.0000 | ORAL_TABLET | Freq: Once | ORAL | Status: AC
Start: 2015-07-27 — End: 2015-07-27
  Administered 2015-07-27: 1 via ORAL
  Filled 2015-07-27: qty 1

## 2015-07-27 MED ORDER — DOXYLAMINE-PYRIDOXINE 10-10 MG PO TBEC: 2.0000 | DELAYED_RELEASE_TABLET | Freq: Every evening | ORAL | Status: DC | PRN

## 2015-07-27 MED ORDER — HYDROCODONE-ACETAMINOPHEN 5-325 MG PO TABS: 1.0000 | ORAL_TABLET | ORAL | Status: DC | PRN

## 2015-07-27 NOTE — ED Provider Notes (Addendum)
CSN: [id number]     Arrival date & time 07/26/15  2112 History   First MD Initiated Contact with Patient 07/26/15 2145     Chief Complaint  Patient presents with  . Abdominal Pain     (Consider location/radiation/quality/duration/timing/severity/associated sxs/prior Treatment) The history is provided by the patient.   [name] is a 34 y.o. female with a past medical history significant for ovarian cysts, kidney stones with prior stent placement (last stone passed in 2013) and recent spontaneous abortion (in April, 4 months ago), presenting with a 3 day history of left lower pelvic pain which has been slowly progressive, sharp with a waxing and waning symptoms along with nausea and occasional dry heaves.  She reports worsened pain if she tries to stand upright for more than a few minutes, causing her to "double over", but also progresses when supine as well and last for a few minutes.  She denies vaginal discharge or bleeding but has noted hematuria without dysuria or flank pain.  Her LMP was 6/19 which was her first normal menses since her abo in April.  She is concerned about possible pregnancy.    Past Medical History  Diagnosis Date  . Ovarian cyst   . Kidney stones    Past Surgical History  Procedure Laterality Date  . Kidney stents    . Lithotripsy    . Appendectomy     Family History  Problem Relation Age of Onset  . Cancer Mother     breast  . Diabetes Maternal Grandmother    History  Substance Use Topics  . Smoking status: Never Smoker   . Smokeless tobacco: Never Used  . Alcohol Use: No   OB History    Gravida Para Term Preterm AB TAB SAB Ectopic Multiple Living   4 4 4       4      Review of Systems  Constitutional: Negative for fever.  HENT: Negative for congestion and sore throat.   Eyes: Negative.   Respiratory: Negative for chest tightness and shortness of breath.   Cardiovascular: Negative for chest pain.  Gastrointestinal: Positive for  nausea, vomiting and abdominal pain. Negative for diarrhea and abdominal distention.  Genitourinary: Positive for dysuria and pelvic pain. Negative for frequency, hematuria and vaginal discharge.  Musculoskeletal: Negative for joint swelling, arthralgias and neck pain.  Skin: Negative.  Negative for rash and wound.  Neurological: Negative for dizziness, weakness, light-headedness, numbness and headaches.  Psychiatric/Behavioral: Negative.       Allergies  Review of patient's allergies indicates no known allergies.  Home Medications   Prior to Admission medications   Medication Sig Start Date End Date Taking? Authorizing Provider  acetaminophen (TYLENOL) 500 MG tablet Take 500 mg by mouth every 6 (six) hours as needed for mild pain or moderate pain.   Yes Historical Provider, MD  ibuprofen (ADVIL,MOTRIN) 200 MG tablet Take 200 mg by mouth every 6 (six) hours as needed for mild pain or moderate pain.   Yes Historical Provider, MD  Doxylamine-Pyridoxine (DICLEGIS) 10-10 MG TBEC Take 2 tablets by mouth at bedtime as needed (May take one additional tablet qam and with lunch prn nausea). 07/27/15   Burgess Amor, PA-C  HYDROcodone-acetaminophen (NORCO/VICODIN) 5-325 MG per tablet Take 1 tablet by mouth every 4 (four) hours as needed. 07/27/15   Burgess Amor, PA-C  promethazine (PHENERGAN) 25 MG tablet Take 1 tablet (25 mg total) by mouth every 6 (six) hours as needed for nausea. Patient not taking: Reported  on 07/26/2015 04/07/15   Derwood Kaplan, MD   BP 122/64 mmHg  Pulse 90  Temp(Src) 98.1 F (36.7 C) (Oral)  Resp 18  Ht  (1.6 m)  Wt 154 lb (69.854 kg)  BMI 27.29 kg/m2  SpO2 100%  LMP 06/11/2015 (Approximate) Physical Exam  Constitutional: She appears well-developed and well-nourished.  HENT:  Head: Normocephalic and atraumatic.  Eyes: Conjunctivae are normal.  Neck: Normal range of motion.  Cardiovascular: Normal rate, regular rhythm, normal heart sounds and intact distal pulses.    Pulmonary/Chest: Effort normal and breath sounds normal. She has no wheezes.  Abdominal: Soft. Bowel sounds are normal. There is tenderness in the left lower quadrant. There is no guarding and no CVA tenderness.  Genitourinary: Vagina normal and uterus normal. Uterus is not tender. Cervix exhibits no motion tenderness and no discharge. Right adnexum displays no mass, no tenderness and no fullness. Left adnexum displays tenderness. Left adnexum displays no mass and no fullness.  Musculoskeletal: Normal range of motion.  Neurological: She is alert.  Skin: Skin is warm and dry.  Psychiatric: She has a normal mood and affect.  Nursing note and vitals reviewed.   ED Course  Procedures (including critical care time) Labs Review Labs Reviewed  WET PREP, GENITAL - Abnormal; Notable for the following:    WBC, Wet Prep HPF POC MANY (*)    All other components within normal limits  PREGNANCY, URINE - Abnormal; Notable for the following:    Preg Test, Ur POSITIVE (*)    All other components within normal limits  URINALYSIS, ROUTINE W REFLEX MICROSCOPIC (NOT AT [facility]) - Abnormal; Notable for the following:    APPearance HAZY (*)    Hgb urine dipstick LARGE (*)    Leukocytes, UA SMALL (*)    All other components within normal limits  HCG, QUANTITATIVE, PREGNANCY - Abnormal; Notable for the following:    hCG, Beta Francene Finders [id number] (*)    All other components within normal limits  COMPREHENSIVE METABOLIC PANEL - Abnormal; Notable for the following:    Calcium 8.6 (*)    Total Bilirubin 0.2 (*)    All other components within normal limits  URINE MICROSCOPIC-ADD ON - Abnormal; Notable for the following:    Squamous Epithelial / LPF MANY (*)    Bacteria, UA FEW (*)    All other components within normal limits  CBC WITH DIFFERENTIAL/PLATELET  RPR  HIV ANTIBODY (ROUTINE TESTING)  ABO/RH  GC/CHLAMYDIA PROBE AMP ([place]) NOT AT [facility]    Imaging Review US Ob Comp Less 14  Wks  07/27/2015   CLINICAL DATA:  Left-sided pelvic pain for 3 days. Nausea and vomiting.  EXAM: OBSTETRIC <14 WK [place] AND TRANSVAGINAL OB US  TECHNIQUE: Both transabdominal and transvaginal ultrasound examinations were performed for complete evaluation of the gestation as well as the maternal uterus, adnexal regions, and pelvic cul-de-sac. Transvaginal technique was performed to assess early pregnancy.  COMPARISON:  None.  FINDINGS: Intrauterine gestational sac: Single  Yolk sac:  Yes  Embryo:  Yes  Cardiac Activity: Yes  Heart Rate: 140  bpm  CRL:  22.3  mm   8 w   6 d                  [place] EDC: 02/29/2016  Maternal uterus/adnexae: Benign ovarian cysts, the largest measuring 3.7 cm on the right.  IMPRESSION: Single living intrauterine gestation measuring 8 weeks 6 days by crown-rump length.   Electronically Signed  By: Ellery Plunk M.D.   On: 07/27/2015 01:29   US Ob Transvaginal  07/27/2015   CLINICAL DATA:  Left-sided pelvic pain for 3 days. Nausea and vomiting.  EXAM: OBSTETRIC <14 WK [place] AND TRANSVAGINAL OB US  TECHNIQUE: Both transabdominal and transvaginal ultrasound examinations were performed for complete evaluation of the gestation as well as the maternal uterus, adnexal regions, and pelvic cul-de-sac. Transvaginal technique was performed to assess early pregnancy.  COMPARISON:  None.  FINDINGS: Intrauterine gestational sac: Single  Yolk sac:  Yes  Embryo:  Yes  Cardiac Activity: Yes  Heart Rate: 140  bpm  CRL:  22.3  mm   8 w   6 d                  [place] EDC: 02/29/2016  Maternal uterus/adnexae: Benign ovarian cysts, the largest measuring 3.7 cm on the right.  IMPRESSION: Single living intrauterine gestation measuring 8 weeks 6 days by crown-rump length.   Electronically Signed   By: Ellery Plunk M.D.   On: 07/27/2015 01:29   US Renal  07/27/2015   CLINICAL DATA:  Left lower back pain.  Hematuria.  EXAM: RENAL / URINARY TRACT ULTRASOUND COMPLETE  COMPARISON:  None.  FINDINGS: Right Kidney:   Length: 12.9 cm. Echogenicity within normal limits. No mass or hydronephrosis visualized.  Left Kidney:  Length: 14.2 cm. Echogenicity within normal limits. No mass or hydronephrosis visualized.  Bladder:  Appears normal for degree of bladder distention.  IMPRESSION: Negative for significant abnormality.   Electronically Signed   By: Ellery Plunk M.D.   On: 07/27/2015 01:33     EKG Interpretation None      MDM   Final diagnoses:  Intrauterine normal pregnancy, first trimester  Uterine adnexal pain  Hematuria    Patients labs and/or radiological studies were reviewed and considered during the medical decision making and disposition process.    Discussed [place] results with Dr. Clovis Riley of radiology - he was able to visualize left ovary, normal size, not appearance of torsion, but was done without doppler flow so cannot confirm.  There is a 1.9 to 2.0 cm left cyst (probable involuting corpus luteal).  No left hydronephrosis, but cannot rule out left ureteral stone either.  Discussed case with Dr. Emelda Fear who reviewed [place], he was able to confirm doppler flow, no evidence for torsion in this ovary.  Will plan to see pt in office tomorrow - pt to call for appt time.  Will prescribe diclegis and hydrocodone for sx relief. Pt agrees with plan.  The patient appears reasonably screened and/or stabilized for discharge and I doubt any other medical condition or other [facility] requiring further screening, evaluation, or treatment in the ED at this time prior to discharge.   Burgess Amor, PA-C 07/27/15 1610  Glynn Octave, MD 07/27/15 1120  Burgess Amor, PA-C 08/12/15 1206  Glynn Octave, MD 08/12/15 [phone]

## 2015-07-27 NOTE — Discharge Instructions (Signed)
First Trimester of Pregnancy °The first trimester of pregnancy is from week 1 until the end of week 12 (months 1 through 3). A week after a sperm fertilizes an egg, the egg will implant on the wall of the uterus. This embryo will begin to develop into a baby. Genes from you and your partner are forming the baby. The female genes determine whether the baby is a boy or a girl. At 6-8 weeks, the eyes and face are formed, and the heartbeat can be seen on ultrasound. At the end of 12 weeks, all the baby's organs are formed.  °Now that you are pregnant, you will want to do everything you can to have a healthy baby. Two of the most important things are to get good prenatal care and to follow your health care provider's instructions. Prenatal care is all the medical care you receive before the baby's birth. This care will help prevent, find, and treat any problems during the pregnancy and childbirth. °BODY CHANGES °Your body goes through many changes during pregnancy. The changes vary from woman to woman.  °· You may gain or lose a couple of pounds at first. °· You may feel sick to your stomach (nauseous) and throw up (vomit). If the vomiting is uncontrollable, call your health care provider. °· You may tire easily. °· You may develop headaches that can be relieved by medicines approved by your health care provider. °· You may urinate more often. Painful urination may mean you have a bladder infection. °· You may develop heartburn as a result of your pregnancy. °· You may develop constipation because certain hormones are causing the muscles that push waste through your intestines to slow down. °· You may develop hemorrhoids or swollen, bulging veins (varicose veins). °· Your breasts may begin to grow larger and become tender. Your nipples may stick out more, and the tissue that surrounds them (areola) may become darker. °· Your gums may bleed and may be sensitive to brushing and flossing. °· Dark spots or blotches (chloasma,  mask of pregnancy) may develop on your face. This will likely fade after the baby is born. °· Your menstrual periods will stop. °· You may have a loss of appetite. °· You may develop cravings for certain kinds of food. °· You may have changes in your emotions from day to day, such as being excited to be pregnant or being concerned that something may go wrong with the pregnancy and baby. °· You may have more vivid and strange dreams. °· You may have changes in your hair. These can include thickening of your hair, rapid growth, and changes in texture. Some women also have hair loss during or after pregnancy, or hair that feels dry or thin. Your hair will most likely return to normal after your baby is born. °WHAT TO EXPECT AT YOUR PRENATAL VISITS °During a routine prenatal visit: °· You will be weighed to make sure you and the baby are growing normally. °· Your blood pressure will be taken. °· Your abdomen will be measured to track your baby's growth. °· The fetal heartbeat will be listened to starting around week 10 or 12 of your pregnancy. °· Test results from any previous visits will be discussed. °Your health care provider may ask you: °· How you are feeling. °· If you are feeling the baby move. °· If you have had any abnormal symptoms, such as leaking fluid, bleeding, severe headaches, or abdominal cramping. °· If you have any questions. °Other tests   that may be performed during your first trimester include: °· Blood tests to find your blood type and to check for the presence of any previous infections. They will also be used to check for low iron levels (anemia) and Rh antibodies. Later in the pregnancy, blood tests for diabetes will be done along with other tests if problems develop. °· Urine tests to check for infections, diabetes, or protein in the urine. °· An ultrasound to confirm the proper growth and development of the baby. °· An amniocentesis to check for possible genetic problems. °· Fetal screens for  spina bifida and Down syndrome. °· You may need other tests to make sure you and the baby are doing well. °HOME CARE INSTRUCTIONS  °Medicines °· Follow your health care provider's instructions regarding medicine use. Specific medicines may be either safe or unsafe to take during pregnancy. °· Take your prenatal vitamins as directed. °· If you develop constipation, try taking a stool softener if your health care provider approves. °Diet °· Eat regular, well-balanced meals. Choose a variety of foods, such as meat or vegetable-based protein, fish, milk and low-fat dairy products, vegetables, fruits, and whole grain breads and cereals. Your health care provider will help you determine the amount of weight gain that is right for you. °· Avoid raw meat and uncooked cheese. These carry germs that can cause birth defects in the baby. °· Eating four or five small meals rather than three large meals a day may help relieve nausea and vomiting. If you start to feel nauseous, eating a few soda crackers can be helpful. Drinking liquids between meals instead of during meals also seems to help nausea and vomiting. °· If you develop constipation, eat more high-fiber foods, such as fresh vegetables or fruit and whole grains. Drink enough fluids to keep your urine clear or pale yellow. °Activity and Exercise °· Exercise only as directed by your health care provider. Exercising will help you: °¨ Control your weight. °¨ Stay in shape. °¨ Be prepared for labor and delivery. °· Experiencing pain or cramping in the lower abdomen or low back is a good sign that you should stop exercising. Check with your health care provider before continuing normal exercises. °· Try to avoid standing for long periods of time. Move your legs often if you must stand in one place for a long time. °· Avoid heavy lifting. °· Wear low-heeled shoes, and practice good posture. °· You may continue to have sex unless your health care provider directs you  otherwise. °Relief of Pain or Discomfort °· Wear a good support bra for breast tenderness.   °· Take warm sitz baths to soothe any pain or discomfort caused by hemorrhoids. Use hemorrhoid cream if your health care provider approves.   °· Rest with your legs elevated if you have leg cramps or low back pain. °· If you develop varicose veins in your legs, wear support hose. Elevate your feet for 15 minutes, 3-4 times a day. Limit salt in your diet. °Prenatal Care °· Schedule your prenatal visits by the twelfth week of pregnancy. They are usually scheduled monthly at first, then more often in the last 2 months before delivery. °· Write down your questions. Take them to your prenatal visits. °· Keep all your prenatal visits as directed by your health care provider. °Safety °· Wear your seat belt at all times when driving. °· Make a list of emergency phone numbers, including numbers for family, friends, the hospital, and police and fire departments. °General Tips °·   Ask your health care provider for a referral to a local prenatal education class. Begin classes no later than at the beginning of month 6 of your pregnancy.  Ask for help if you have counseling or nutritional needs during pregnancy. Your health care provider can offer advice or refer you to specialists for help with various needs.  Do not use hot tubs, steam rooms, or saunas.  Do not douche or use tampons or scented sanitary pads.  Do not cross your legs for long periods of time.  Avoid cat litter boxes and soil used by cats. These carry germs that can cause birth defects in the baby and possibly loss of the fetus by miscarriage or stillbirth.  Avoid all smoking, herbs, alcohol, and medicines not prescribed by your health care provider. Chemicals in these affect the formation and growth of the baby.  Schedule a dentist appointment. At home, brush your teeth with a soft toothbrush and be gentle when you floss. SEEK MEDICAL CARE IF:   You have  dizziness.  You have mild pelvic cramps, pelvic pressure, or nagging pain in the abdominal area.  You have persistent nausea, vomiting, or diarrhea.  You have a bad smelling vaginal discharge.  You have pain with urination.  You notice increased swelling in your face, hands, legs, or ankles. SEEK IMMEDIATE MEDICAL CARE IF:   You have a fever.  You are leaking fluid from your vagina.  You have spotting or bleeding from your vagina.  You have severe abdominal cramping or pain.  You have rapid weight gain or loss.  You vomit blood or material that looks like coffee grounds.  You are exposed to [place] measles and have never had them.  You are exposed to fifth disease or chickenpox.  You develop a severe headache.  You have shortness of breath.  You have any kind of trauma, such as from a fall or a car accident. Document Released: 12/03/2001 Document Revised: 04/25/2014 Document Reviewed: 10/19/2013 [facility] Patient Information 2015 [place], [place]. This information is not intended to replace advice given to you by your health care provider. Make sure you discuss any questions you have with your health care provider.  Pelvic Pain Female pelvic pain can be caused by many different things and start from a variety of places. Pelvic pain refers to pain that is located in the lower half of the abdomen and between your hips. The pain may occur over a short period of time (acute) or may be reoccurring (chronic). The cause of pelvic pain may be related to disorders affecting the female reproductive organs (gynecologic), but it may also be related to the bladder, kidney stones, an intestinal complication, or muscle or skeletal problems. Getting help right away for pelvic pain is important, especially if there has been severe, sharp, or a sudden onset of unusual pain. It is also important to get help right away because some types of pelvic pain can be life threatening.  CAUSES  Below are  only some of the causes of pelvic pain. The causes of pelvic pain can be in one of several categories.   Gynecologic.  Pelvic inflammatory disease.  Sexually transmitted infection.  Ovarian cyst or a twisted ovarian ligament (ovarian torsion).  Uterine lining that grows outside the uterus (endometriosis).  Fibroids, cysts, or tumors.  Ovulation.  Pregnancy.  Pregnancy that occurs outside the uterus (ectopic pregnancy).  Miscarriage.  Labor.  Abruption of the placenta or ruptured uterus.  Infection.  Uterine infection (endometritis).  Bladder  infection.  Diverticulitis.  Miscarriage related to a uterine infection (septic abortion).  Bladder.  Inflammation of the bladder (cystitis).  Kidney stone(s).  Gastrointestinal.  Constipation.  Diverticulitis.  Neurologic.  Trauma.  Feeling pelvic pain because of mental or emotional causes (psychosomatic).  Cancers of the bowel or pelvis. EVALUATION  Your caregiver will want to take a careful history of your concerns. This includes recent changes in your health, a careful gynecologic history of your periods (menses), and a sexual history. Obtaining your family history and medical history is also important. Your caregiver may suggest a pelvic exam. A pelvic exam will help identify the location and severity of the pain. It also helps in the evaluation of which organ system may be involved. In order to identify the cause of the pelvic pain and be properly treated, your caregiver may order tests. These tests may include:   A pregnancy test.  Pelvic ultrasonography.  An X-ray exam of the abdomen.  A urinalysis or evaluation of vaginal discharge.  Blood tests. HOME CARE INSTRUCTIONS   Only take over-the-counter or prescription medicines for pain, discomfort, or fever as directed by your caregiver.   Rest as directed by your caregiver.   Eat a balanced diet.   Drink enough fluids to make your urine clear or  pale yellow, or as directed.   Avoid sexual intercourse if it causes pain.   Apply warm or cold compresses to the lower abdomen depending on which one helps the pain.   Avoid stressful situations.   Keep a journal of your pelvic pain. Write down when it started, where the pain is located, and if there are things that seem to be associated with the pain, such as food or your menstrual cycle.  Follow up with your caregiver as directed.  SEEK MEDICAL CARE IF:  Your medicine does not help your pain.  You have abnormal vaginal discharge. SEEK IMMEDIATE MEDICAL CARE IF:   You have heavy bleeding from the vagina.   Your pelvic pain increases.   You feel light-headed or faint.   You have chills.   You have pain with urination or blood in your urine.   You have uncontrolled diarrhea or vomiting.   You have a fever or persistent symptoms for more than 3 days.  You have a fever and your symptoms suddenly get worse.   You are being physically or sexually abused.  MAKE SURE YOU:  Understand these instructions.  Will watch your condition.  Will get help if you are not doing well or get worse. Document Released: 11/05/2004 Document Revised: 04/25/2014 Document Reviewed: 03/30/2012 [facility] Patient Information 2015 [place], [place]. This information is not intended to replace advice given to you by your health care provider. Make sure you discuss any questions you have with your health care provider.    You may use the medicines prescribed for pain and nausea, use caution with hydrocodone as this will make you drowsy.  Call Dr. Rayna Sexton office for a recheck in his office tomorrow as discussed.

## 2015-07-28 LAB — GC/CHLAMYDIA PROBE AMP (~~LOC~~) NOT AT ARMC
Chlamydia: NEGATIVE
Neisseria Gonorrhea: NEGATIVE

## 2015-07-28 LAB — HIV ANTIBODY (ROUTINE TESTING W REFLEX): HIV SCREEN 4TH GENERATION: NONREACTIVE

## 2015-07-28 LAB — RPR: RPR: NONREACTIVE

## 2015-07-31 ENCOUNTER — Telehealth: Payer: Self-pay | Admitting: Obstetrics and Gynecology

## 2015-07-31 ENCOUNTER — Other Ambulatory Visit: Payer: Medicaid Other

## 2015-08-01 ENCOUNTER — Other Ambulatory Visit: Payer: Self-pay | Admitting: Obstetrics and Gynecology

## 2015-08-01 MED ORDER — HYDROCODONE-ACETAMINOPHEN 5-325 MG PO TABS: 1.0000 | ORAL_TABLET | ORAL | Status: DC | PRN

## 2015-08-09 ENCOUNTER — Encounter: Payer: Self-pay | Admitting: Advanced Practice Midwife

## 2015-08-09 ENCOUNTER — Ambulatory Visit (INDEPENDENT_AMBULATORY_CARE_PROVIDER_SITE_OTHER): Payer: Medicaid Other | Admitting: Advanced Practice Midwife

## 2015-08-09 ENCOUNTER — Telehealth: Payer: Self-pay | Admitting: Advanced Practice Midwife

## 2015-08-09 VITALS — BP 110/80 | HR 76 | Wt 150.0 lb

## 2015-08-09 DIAGNOSIS — Z3682 Encounter for antenatal screening for nuchal translucency: Secondary | ICD-10-CM

## 2015-08-09 DIAGNOSIS — Z331 Pregnant state, incidental: Secondary | ICD-10-CM

## 2015-08-09 DIAGNOSIS — Z1389 Encounter for screening for other disorder: Secondary | ICD-10-CM

## 2015-08-09 DIAGNOSIS — Z3490 Encounter for supervision of normal pregnancy, unspecified, unspecified trimester: Secondary | ICD-10-CM

## 2015-08-09 DIAGNOSIS — Z3491 Encounter for supervision of normal pregnancy, unspecified, first trimester: Secondary | ICD-10-CM

## 2015-08-09 DIAGNOSIS — O09899 Supervision of other high risk pregnancies, unspecified trimester: Secondary | ICD-10-CM | POA: Insufficient documentation

## 2015-08-09 DIAGNOSIS — Z0283 Encounter for blood-alcohol and blood-drug test: Secondary | ICD-10-CM

## 2015-08-09 DIAGNOSIS — Z369 Encounter for antenatal screening, unspecified: Secondary | ICD-10-CM

## 2015-08-09 LAB — POCT URINALYSIS DIPSTICK
Blood, UA: NEGATIVE
GLUCOSE UA: NEGATIVE
Ketones, UA: NEGATIVE
LEUKOCYTES UA: NEGATIVE
NITRITE UA: NEGATIVE
Protein, UA: NEGATIVE

## 2015-08-09 NOTE — Patient Instructions (Signed)

## 2015-08-09 NOTE — Progress Notes (Signed)
  Subjective:    [name] is a [id number] [redacted]w[redacted]d being seen today for her first obstetrical visit.  Her obstetrical history is significant for pregnancy induced hypertension. With out preeclampsia   She has also taken procardia for preterm contractions without preterm labor.   Pregnancy history fully reviewed.  Patient reports no complaints.  Feels well.   Filed Vitals:   08/09/15 1106  BP: 110/80  Pulse: 76  Weight: 150 lb (68.04 kg)    HISTORY: OB History  Gravida Para Term Preterm AB SAB TAB Ectopic Multiple Living  # Outcome Date GA Lbr Len/2nd Weight Sex Delivery Anes PTL Lv  5 Current           4 Term 01/10/14 [redacted]w[redacted]d 09:00 / 04:13 7 lb 11.3 oz (3.496 kg) M Vag-Spont EPI  Y     Comments: Mom 63 y/o G4P4. Labs neg. Gestational HTN on [place]. Baby 37 w, NSVD, L hydronephrosis which was normal at 36 w U/S. Had phototherapy due to 10 bili at 24 hrs. Remained at 10. Requested repeat today.  Mom O+/ baby O+.   3 Term 01/10/06 [redacted]w[redacted]d  7 lb 8 oz (3.402 kg) F Vag-Spont EPI  Y  2 Term 09/02/01 [redacted]w[redacted]d  9 lb 2 oz (4.139 kg) M Vag-Spont EPI  Y  1 Term 01/24/97 [redacted]w[redacted]d  6 lb 4 oz (2.835 kg) F Vag-Spont EPI  Y     Past Medical History  Diagnosis Date  . Ovarian cyst   . Kidney stones   . Pregnant 08/09/2015   Past Surgical History  Procedure Laterality Date  . Kidney stents    . Lithotripsy    . Appendectomy     Family History  Problem Relation Age of Onset  . Cancer Mother     breast  . Diabetes Maternal Grandmother      Exam                                      System:     Skin: normal coloration and turgor, no rashes    Neurologic: oriented, normal, normal mood   Extremities: normal strength, tone, and muscle mass   HEENT PERRLA   Mouth/Teeth mucous membranes moist, normal  dentition   Neck supple and no masses   Cardiovascular: regular rate and rhythm   Respiratory:  appears well, vitals normal, no respiratory distress, acyanotic    Abdomen: soft, non-tender;  FHR: 160 [place]          Assessment:    Pregnancy: [id number] Patient Active Problem List   Diagnosis Date Noted  . Pregnant 08/09/2015        Plan:     Initial labs drawn. Continue prenatal vitamins  Problem list reviewed and updated  Reviewed n/v relief measures and warning s/s to report  Reviewed recommended weight gain based on pre-gravid BMI  Encouraged well-balanced diet Genetic Screening discussed Integrated Screen: requested.  Ultrasound discussed; fetal survey: requested.  Follow up in 2 weeks for NT/IT.  CRESENZO-DISHMAN,Yetunde Leis 08/09/2015

## 2015-08-10 NOTE — Telephone Encounter (Signed)
No, she has has #60 hydrocodone for non specific pain, is only [redacted] weeks pregnant, and it is inappropriate to continue prescribing narcotics.

## 2015-08-10 NOTE — Telephone Encounter (Signed)
Pt informed per Ashley Jacobs, CNM will not refill Hydrocodone for non specific pain at 11 weeks of pregnancy. Pt verbalized understanding.

## 2015-08-11 LAB — GC/CHLAMYDIA PROBE AMP
CHLAMYDIA, DNA PROBE: NEGATIVE
NEISSERIA GONORRHOEAE BY PCR: NEGATIVE

## 2015-08-11 LAB — URINE CULTURE

## 2015-08-14 ENCOUNTER — Emergency Department (HOSPITAL_COMMUNITY)
Admission: EM | Admit: 2015-08-14 | Discharge: 2015-08-14 | Disposition: A | Discharge status: Home / Self Care | Payer: Medicaid Other | Attending: Emergency Medicine | Admitting: Emergency Medicine

## 2015-08-14 ENCOUNTER — Encounter (HOSPITAL_COMMUNITY): Payer: Self-pay | Admitting: *Deleted

## 2015-08-14 ENCOUNTER — Telehealth: Payer: Self-pay | Admitting: Obstetrics and Gynecology

## 2015-08-14 DIAGNOSIS — R109 Unspecified abdominal pain: Secondary | ICD-10-CM

## 2015-08-14 DIAGNOSIS — Z3A08 8 weeks gestation of pregnancy: Secondary | ICD-10-CM | POA: Diagnosis not present

## 2015-08-14 DIAGNOSIS — O9989 Other specified diseases and conditions complicating pregnancy, childbirth and the puerperium: Secondary | ICD-10-CM | POA: Diagnosis present

## 2015-08-14 DIAGNOSIS — Z87442 Personal history of urinary calculi: Secondary | ICD-10-CM | POA: Diagnosis not present

## 2015-08-14 DIAGNOSIS — R1031 Right lower quadrant pain: Secondary | ICD-10-CM | POA: Insufficient documentation

## 2015-08-14 DIAGNOSIS — Z8742 Personal history of other diseases of the female genital tract: Secondary | ICD-10-CM | POA: Diagnosis not present

## 2015-08-14 LAB — BASIC METABOLIC PANEL
Anion gap: 8 (ref 5–15)
BUN: 6 mg/dL (ref 6–20)
CO2: 21 mmol/L — ABNORMAL LOW (ref 22–32)
Calcium: 8.5 mg/dL — ABNORMAL LOW (ref 8.9–10.3)
Chloride: 107 mmol/L (ref 101–111)
Creatinine, Ser: 0.51 mg/dL (ref 0.44–1.00)
GFR calc Af Amer: >60 mL/min (ref >60)
GFR calc non Af Amer: >60 mL/min (ref >60)
Glucose, Bld: 90 mg/dL (ref 65–99)
Potassium: 3.5 mmol/L (ref 3.5–5.1)
Sodium: 136 mmol/L (ref 135–145)

## 2015-08-14 LAB — CBC WITH DIFFERENTIAL/PLATELET
Basophils Absolute: 0 10*3/uL (ref 0.0–0.1)
Basophils Relative: 0 % (ref 0–1)
Eosinophils Absolute: 0.1 10*3/uL (ref 0.0–0.7)
Eosinophils Relative: 1 % (ref 0–5)
HCT: 32.2 % — ABNORMAL LOW (ref 36.0–46.0)
Hemoglobin: 10.7 g/dL — ABNORMAL LOW (ref 12.0–15.0)
Lymphocytes Relative: 20 % (ref 12–46)
Lymphs Abs: 2.2 10*3/uL (ref 0.7–4.0)
MCH: 28.1 pg (ref 26.0–34.0)
MCHC: 33.2 g/dL (ref 30.0–36.0)
MCV: 84.5 fL (ref 78.0–100.0)
Monocytes Absolute: 0.6 10*3/uL (ref 0.1–1.0)
Monocytes Relative: 6 % (ref 3–12)
Neutro Abs: 7.7 10*3/uL (ref 1.7–7.7)
Neutrophils Relative %: 73 % (ref 43–77)
Platelets: 270 10*3/uL (ref 150–400)
RBC: 3.81 MIL/uL — ABNORMAL LOW (ref 3.87–5.11)
RDW: 13.8 % (ref 11.5–15.5)
WBC: 10.7 10*3/uL — ABNORMAL HIGH (ref 4.0–10.5)

## 2015-08-14 LAB — URINALYSIS, ROUTINE W REFLEX MICROSCOPIC
BILIRUBIN URINE: NEGATIVE
GLUCOSE, UA: NEGATIVE mg/dL
Nitrite: NEGATIVE
PH: 6.5 (ref 5.0–8.0)
SPECIFIC GRAVITY, URINE: 1.025 (ref 1.005–1.030)
Urobilinogen, UA: 1 mg/dL (ref 0.0–1.0)

## 2015-08-14 LAB — URINE MICROSCOPIC-ADD ON

## 2015-08-14 LAB — PREGNANCY, URINE: Preg Test, Ur: POSITIVE — AB

## 2015-08-14 MED ORDER — IBUPROFEN 400 MG PO TABS
600.0000 mg | ORAL_TABLET | Freq: Once | ORAL | Status: AC
  Administered 2015-08-14: 600 mg via ORAL
  Filled 2015-08-14: qty 2

## 2015-08-14 MED ORDER — ONDANSETRON 4 MG PO TBDP
4.0000 mg | ORAL_TABLET | Freq: Once | ORAL | Status: AC
  Administered 2015-08-14: 4 mg via ORAL
  Filled 2015-08-14: qty 1

## 2015-08-14 MED ORDER — OXYCODONE-ACETAMINOPHEN 5-325 MG PO TABS
2.0000 | ORAL_TABLET | Freq: Once | ORAL | Status: AC
  Administered 2015-08-14: 2 via ORAL
  Filled 2015-08-14: qty 2

## 2015-08-14 NOTE — ED Notes (Signed)
Pt c/o right side flank pain that radiates to back; pt c/o nausea and dizziness with a weird taste in her mouth that started a couple of hours ago; pt states she has some blood in her urine

## 2015-08-14 NOTE — Discharge Instructions (Signed)

## 2015-08-14 NOTE — ED Notes (Signed)
Dr Kohut at bedside,  

## 2015-08-14 NOTE — Telephone Encounter (Signed)
Pt called back and is aware that she would need an appointment per Dr. Emelda Fear before she could have a refill on her pain medication. Pt verbalized understanding and appointment was made.

## 2015-08-14 NOTE — ED Notes (Signed)
No further n/v noted,

## 2015-08-14 NOTE — ED Notes (Signed)
Pt had one episode of n/v, states that the pain has not changed, pt also reports that she has been out of her pain medication since last week, states that she takes vicodin bid,

## 2015-08-14 NOTE — Telephone Encounter (Signed)
Review by appt only. Please notify me if pt is self-pay

## 2015-08-14 NOTE — ED Provider Notes (Signed)
CSN: [id number]     Arrival date & time 08/14/15  2001 History  This chart was scribed for Raeford Razor, MD by Phillis Haggis, ED Scribe. This patient was seen in room APA14/APA14 and patient care was started at 8:25 PM.     Chief Complaint  Patient presents with  . Flank Pain   The history is provided by the patient. No language interpreter was used.  HPI Comments: [name] is a 34 y.o. Female with a hx of ovarian cysts, kidney stones and is currently pregnant who presents to the Emergency Department complaining of gradually worsening, radiating right flank pain onset 3 days ago. Pt states that she first began to have pain in the right flank that has now begun to radiate to her middle lower back. Pt reports associated hematuria, decreased urine output, pelvic pressure, nausea, diaphoresis, dizziness, and a copper taste in her mouth, onset 4 hours ago. She states that this feels like her past kidney stone pain and typically uses a heating pad for relief, but states that the heating pad did not work this time. Reports hx of appendectomy, lithotripsy, and kidney stent surgeries. She denies dysuria, vaginal bleeding or vaginal discharge.    Past Medical History  Diagnosis Date  . Ovarian cyst   . Kidney stones   . Pregnant 08/09/2015   Past Surgical History  Procedure Laterality Date  . Kidney stents    . Lithotripsy    . Appendectomy     Family History  Problem Relation Age of Onset  . Cancer Mother     breast  . Diabetes Maternal Grandmother    Social History  Substance Use Topics  . Smoking status: Never Smoker   . Smokeless tobacco: Never Used  . Alcohol Use: No   OB History    Gravida Para Term Preterm AB TAB SAB Ectopic Multiple Living   5 4 4       4      Review of Systems 10 Systems reviewed and all are negative for acute change except as noted in the HPI.  Allergies  Review of patient's allergies indicates no known allergies.  Home Medications    Prior to Admission medications   Medication Sig Start Date End Date Taking? Authorizing Provider  acetaminophen (TYLENOL) 500 MG tablet Take 500 mg by mouth every 6 (six) hours as needed for mild pain or moderate pain.    Historical Provider, MD  HYDROcodone-acetaminophen (NORCO/VICODIN) 5-325 MG per tablet Take 1 tablet by mouth every 4 (four) hours as needed. 08/01/15   Tilda Burrow, MD  ibuprofen (ADVIL,MOTRIN) 200 MG tablet Take 200 mg by mouth every 6 (six) hours as needed for mild pain or moderate pain.    Historical Provider, MD   BP 150/72 mmHg  Pulse 100  Temp(Src) 98.6 F (37 C) (Oral)  Resp 20  Ht 5\' 2"  (1.575 m)  Wt 150 lb (68.04 kg)  BMI 27.43 kg/m2  SpO2 100%  LMP 06/11/2015 (Approximate)  Physical Exam  Constitutional: She is oriented to person, place, and time. She appears well-developed and well-nourished.  HENT:  Head: Normocephalic and atraumatic.  Eyes: EOM are normal.  Neck: Normal range of motion. Neck supple.  Cardiovascular: Normal rate.   Pulmonary/Chest: Effort normal.  Abdominal: There is no rebound and no guarding.  Tender in RLQ and right flank; no CVA tenderness  Musculoskeletal: Normal range of motion.  Neurological: She is alert and oriented to person, place, and time.  Skin: Skin  is warm and dry.  Psychiatric: She has a normal mood and affect. Her behavior is normal.  Nursing note and vitals reviewed.   ED Course  Procedures (including critical care time) DIAGNOSTIC STUDIES: Oxygen Saturation is 100% on RA, normal by my interpretation.    COORDINATION OF CARE: 8:30 PM-Discussed treatment plan which includes UA with pt at bedside and pt agreed to plan.   Labs Review Labs Reviewed  URINALYSIS, ROUTINE W REFLEX MICROSCOPIC (NOT AT [facility]) - Abnormal; Notable for the following:    APPearance HAZY (*)    Hgb urine dipstick LARGE (*)    Ketones, ur TRACE (*)    Protein, ur TRACE (*)    Leukocytes, UA SMALL (*)    All other components  within normal limits  PREGNANCY, URINE - Abnormal; Notable for the following:    Preg Test, Ur POSITIVE (*)    All other components within normal limits  CBC WITH DIFFERENTIAL/PLATELET - Abnormal; Notable for the following:    WBC 10.7 (*)    RBC 3.81 (*)    Hemoglobin 10.7 (*)    HCT 32.2 (*)    All other components within normal limits  BASIC METABOLIC PANEL - Abnormal; Notable for the following:    CO2 21 (*)    Calcium 8.5 (*)    All other components within normal limits  URINE MICROSCOPIC-ADD ON - Abnormal; Notable for the following:    Squamous Epithelial / LPF MANY (*)    Bacteria, UA MANY (*)    All other components within normal limits  URINE CULTURE    Imaging Review No results found.    EKG Interpretation None      MDM   Final diagnoses:  Right sided abdominal pain    34 year old female with right-sided abdominal pain. May potentially be kidney stone. Will not scan secondary to being pregnant. Hasn't to get further pain medicine aside from Tylenol given pregnancy. Advised that she is to follow-up with her obstetrician or PCP if she feels like she needs anything stronger. Return precautions were discussed.   I personally preformed the services scribed in my presence. The recorded information has been reviewed is accurate. Raeford Razor, MD.     Raeford Razor, MD 08/24/15 [phone]

## 2015-08-15 LAB — CYSTIC FIBROSIS MUTATION 97: GENE DIS ANAL CARRIER INTERP BLD/T-IMP: NOT DETECTED

## 2015-08-15 LAB — URINALYSIS, ROUTINE W REFLEX MICROSCOPIC
Bilirubin, UA: NEGATIVE
Glucose, UA: NEGATIVE
Ketones, UA: NEGATIVE
LEUKOCYTES UA: NEGATIVE
Nitrite, UA: NEGATIVE
PH UA: 7 (ref 5.0–7.5)
PROTEIN UA: NEGATIVE
RBC, UA: NEGATIVE
Specific Gravity, UA: 1.015 (ref 1.005–1.030)
Urobilinogen, Ur: 1 mg/dL (ref 0.2–1.0)

## 2015-08-15 LAB — CBC
Hematocrit: 36.1 % (ref 34.0–46.6)
Hemoglobin: 11.6 g/dL (ref 11.1–15.9)
MCH: 27.2 pg (ref 26.6–33.0)
MCHC: 32.1 g/dL (ref 31.5–35.7)
MCV: 85 fL (ref 79–97)
PLATELETS: 288 10*3/uL (ref 150–379)
RBC: 4.27 x10E6/uL (ref 3.77–5.28)
RDW: 15.4 % (ref 12.3–15.4)
WBC: 8.4 10*3/uL (ref 3.4–10.8)

## 2015-08-15 LAB — PMP SCREEN PROFILE (10S), URINE
Amphetamine Screen, Ur: NEGATIVE ng/mL
Barbiturate Screen, Ur: NEGATIVE ng/mL
Benzodiazepine Screen, Urine: NEGATIVE ng/mL
CREATININE(CRT), U: 77.8 mg/dL (ref 20.0–300.0)
Cannabinoids Ur Ql Scn: NEGATIVE ng/mL
Cocaine(Metab.)Screen, Urine: NEGATIVE ng/mL
METHADONE SCREEN, URINE: NEGATIVE ng/mL
OPIATE SCRN UR: POSITIVE ng/mL
OXYCODONE+OXYMORPHONE UR QL SCN: NEGATIVE ng/mL
PCP SCRN UR: NEGATIVE ng/mL
PROPOXYPHENE SCREEN: NEGATIVE ng/mL
Ph of Urine: 6.5 (ref 4.5–8.9)

## 2015-08-15 LAB — RPR: RPR: NONREACTIVE

## 2015-08-15 LAB — HEPATITIS B SURFACE ANTIGEN: Hepatitis B Surface Ag: NEGATIVE

## 2015-08-15 LAB — ABO/RH: RH TYPE: POSITIVE

## 2015-08-15 LAB — RUBELLA SCREEN: Rubella Antibodies, IGG: 1.84 index (ref >0.99)

## 2015-08-15 LAB — SICKLE CELL SCREEN: SICKLE CELL SCREEN: NEGATIVE

## 2015-08-15 LAB — ANTIBODY SCREEN: Antibody Screen: NEGATIVE

## 2015-08-15 LAB — VARICELLA ZOSTER ANTIBODY, IGG: Varicella zoster IgG: 533 index (ref >165)

## 2015-08-16 ENCOUNTER — Ambulatory Visit (INDEPENDENT_AMBULATORY_CARE_PROVIDER_SITE_OTHER): Payer: Medicaid Other | Admitting: Advanced Practice Midwife

## 2015-08-16 ENCOUNTER — Ambulatory Visit (HOSPITAL_COMMUNITY): Admission: RE | Admit: 2015-08-16 | Payer: Medicaid Other | Source: Ambulatory Visit

## 2015-08-16 VITALS — BP 116/80 | HR 76 | Wt 152.0 lb

## 2015-08-16 DIAGNOSIS — R109 Unspecified abdominal pain: Secondary | ICD-10-CM

## 2015-08-16 DIAGNOSIS — Z1389 Encounter for screening for other disorder: Secondary | ICD-10-CM

## 2015-08-16 DIAGNOSIS — R319 Hematuria, unspecified: Secondary | ICD-10-CM

## 2015-08-16 DIAGNOSIS — Z331 Pregnant state, incidental: Secondary | ICD-10-CM | POA: Diagnosis not present

## 2015-08-16 DIAGNOSIS — Z3491 Encounter for supervision of normal pregnancy, unspecified, first trimester: Secondary | ICD-10-CM

## 2015-08-16 LAB — POCT URINALYSIS DIPSTICK
Blood, UA: 3
GLUCOSE UA: NEGATIVE
Ketones, UA: NEGATIVE
Nitrite, UA: NEGATIVE
Protein, UA: NEGATIVE

## 2015-08-16 LAB — URINE CULTURE

## 2015-08-16 NOTE — Progress Notes (Addendum)
WORK IN for right flank pain, hematuria.  Seen in ED 2 days ago with same complaints.  Urine culture still pending. Had renal US 8/3 without abnormalities, but given new onset of hematuria and hx of having to have stents, I feel that is would be reasonable to check again to make sure there is not an obstruction.  Pt is only [redacted] weeks pregnant, so very reluctant to rx narcotics unless surgery is needed.  Pt has been taking hydrocodone for several weeks for non specific pain, warned against continuing this and that unless there was a clear reason to warrant it, narcotics would not be continued.    08/17/2015  Renal US normal, urine culture negative.

## 2015-08-17 ENCOUNTER — Ambulatory Visit (HOSPITAL_COMMUNITY)
Admission: RE | Admit: 2015-08-17 | Discharge: 2015-08-17 | Disposition: A | Discharge status: Home / Self Care | Payer: Medicaid Other | Source: Ambulatory Visit | Attending: Advanced Practice Midwife | Admitting: Advanced Practice Midwife

## 2015-08-17 DIAGNOSIS — R319 Hematuria, unspecified: Secondary | ICD-10-CM | POA: Diagnosis not present

## 2015-08-17 DIAGNOSIS — R109 Unspecified abdominal pain: Secondary | ICD-10-CM | POA: Diagnosis present

## 2015-08-23 ENCOUNTER — Encounter: Payer: Medicaid Other | Admitting: Women's Health

## 2015-08-23 ENCOUNTER — Encounter: Payer: Self-pay | Admitting: Women's Health

## 2015-08-23 ENCOUNTER — Other Ambulatory Visit: Payer: Medicaid Other

## 2015-08-30 ENCOUNTER — Encounter: Payer: Self-pay | Admitting: Women's Health

## 2015-08-30 ENCOUNTER — Ambulatory Visit (INDEPENDENT_AMBULATORY_CARE_PROVIDER_SITE_OTHER): Payer: Medicaid Other

## 2015-08-30 ENCOUNTER — Ambulatory Visit (INDEPENDENT_AMBULATORY_CARE_PROVIDER_SITE_OTHER): Payer: Medicaid Other | Admitting: Women's Health

## 2015-08-30 VITALS — BP 124/60 | HR 84 | Wt 155.0 lb

## 2015-08-30 DIAGNOSIS — Z3490 Encounter for supervision of normal pregnancy, unspecified, unspecified trimester: Secondary | ICD-10-CM

## 2015-08-30 DIAGNOSIS — Z1389 Encounter for screening for other disorder: Secondary | ICD-10-CM

## 2015-08-30 DIAGNOSIS — Z36 Encounter for antenatal screening of mother: Secondary | ICD-10-CM

## 2015-08-30 DIAGNOSIS — Z331 Pregnant state, incidental: Secondary | ICD-10-CM

## 2015-08-30 DIAGNOSIS — N83201 Unspecified ovarian cyst, right side: Secondary | ICD-10-CM | POA: Insufficient documentation

## 2015-08-30 DIAGNOSIS — Z3491 Encounter for supervision of normal pregnancy, unspecified, first trimester: Secondary | ICD-10-CM

## 2015-08-30 DIAGNOSIS — Z3682 Encounter for antenatal screening for nuchal translucency: Secondary | ICD-10-CM

## 2015-08-30 LAB — POCT URINALYSIS DIPSTICK
Blood, UA: NEGATIVE
GLUCOSE UA: NEGATIVE
Ketones, UA: NEGATIVE
NITRITE UA: NEGATIVE
PROTEIN UA: NEGATIVE

## 2015-08-30 MED ORDER — PRENATAL PLUS 27-1 MG PO TABS: 1.0000 | ORAL_TABLET | Freq: Every day | ORAL | Status: DC

## 2015-08-30 NOTE — Progress Notes (Signed)
Low-risk OB appointment [id number] [redacted]w[redacted]d Estimated Date of Delivery: 02/29/16 BP 124/60 mmHg  Pulse 84  Wt 155 lb (70.308 kg)  LMP 06/11/2015 (Approximate)  BP, weight, and urine reviewed.  Refer to obstetrical flow sheet for FH & FHR.  No fm yet. Denies cramping, lof, vb, or uti s/s. Intermittent RLQ pain- does have a 4cm Rt ovarian cyst on nt u/s today- possibly r/t that vs. RLP- discussed relief measures.  Reviewed today's nt u/s- crl too big for nt- offered afp- will draw at next visit. Plan:  Continue routine obstetrical care  F/U in 3wks for OB appointment and AFP

## 2015-08-30 NOTE — Progress Notes (Signed)
[place] 13+6wks,crl 8.63cm out of range for NT,measurements c/w dates,simple rt ov cyst 4 x 3.5 x 3.4cm,normal lt ov,fht 155bpm,NB present,post pl gr 0

## 2015-08-30 NOTE — Patient Instructions (Signed)
Second Trimester of Pregnancy The second trimester is from week 13 through week 28, months 4 through 6. The second trimester is often a time when you feel your best. Your body has also adjusted to being pregnant, and you begin to feel better physically. Usually, morning sickness has lessened or quit completely, you may have more energy, and you may have an increase in appetite. The second trimester is also a time when the fetus is growing rapidly. At the end of the sixth month, the fetus is about 9 inches long and weighs about 1 pounds. You will likely begin to feel the baby move (quickening) between 18 and 20 weeks of the pregnancy. BODY CHANGES Your body goes through many changes during pregnancy. The changes vary from woman to woman.   Your weight will continue to increase. You will notice your lower abdomen bulging out.  You may begin to get stretch marks on your hips, abdomen, and breasts.  You may develop headaches that can be relieved by medicines approved by your health care provider.  You may urinate more often because the fetus is pressing on your bladder.  You may develop or continue to have heartburn as a result of your pregnancy.  You may develop constipation because certain hormones are causing the muscles that push waste through your intestines to slow down.  You may develop hemorrhoids or swollen, bulging veins (varicose veins).  You may have back pain because of the weight gain and pregnancy hormones relaxing your joints between the bones in your pelvis and as a result of a shift in weight and the muscles that support your balance.  Your breasts will continue to grow and be tender.  Your gums may bleed and may be sensitive to brushing and flossing.  Dark spots or blotches (chloasma, mask of pregnancy) may develop on your face. This will likely fade after the baby is born.  A dark line from your belly button to the pubic area (linea nigra) may appear. This will likely fade  after the baby is born.  You may have changes in your hair. These can include thickening of your hair, rapid growth, and changes in texture. Some women also have hair loss during or after pregnancy, or hair that feels dry or thin. Your hair will most likely return to normal after your baby is born. WHAT TO EXPECT AT YOUR PRENATAL VISITS During a routine prenatal visit:  You will be weighed to make sure you and the fetus are growing normally.  Your blood pressure will be taken.  Your abdomen will be measured to track your baby's growth.  The fetal heartbeat will be listened to.  Any test results from the previous visit will be discussed. Your health care provider may ask you:  How you are feeling.  If you are feeling the baby move.  If you have had any abnormal symptoms, such as leaking fluid, bleeding, severe headaches, or abdominal cramping.  If you have any questions. Other tests that may be performed during your second trimester include:  Blood tests that check for:  Low iron levels (anemia).  Gestational diabetes (between 24 and 28 weeks).  Rh antibodies.  Urine tests to check for infections, diabetes, or protein in the urine.  An ultrasound to confirm the proper growth and development of the baby.  An amniocentesis to check for possible genetic problems.  Fetal screens for spina bifida and Down syndrome. HOME CARE INSTRUCTIONS   Avoid all smoking, herbs, alcohol, and unprescribed   drugs. These chemicals affect the formation and growth of the baby.  Follow your health care provider's instructions regarding medicine use. There are medicines that are either safe or unsafe to take during pregnancy.  Exercise only as directed by your health care provider. Experiencing uterine cramps is a good sign to stop exercising.  Continue to eat regular, healthy meals.  Wear a good support bra for breast tenderness.  Do not use hot tubs, steam rooms, or saunas.  Wear your  seat belt at all times when driving.  Avoid raw meat, uncooked cheese, cat litter boxes, and soil used by cats. These carry germs that can cause birth defects in the baby.  Take your prenatal vitamins.  Try taking a stool softener (if your health care provider approves) if you develop constipation. Eat more high-fiber foods, such as fresh vegetables or fruit and whole grains. Drink plenty of fluids to keep your urine clear or pale yellow.  Take warm sitz baths to soothe any pain or discomfort caused by hemorrhoids. Use hemorrhoid cream if your health care provider approves.  If you develop varicose veins, wear support hose. Elevate your feet for 15 minutes, 3-4 times a day. Limit salt in your diet.  Avoid heavy lifting, wear low heel shoes, and practice good posture.  Rest with your legs elevated if you have leg cramps or low back pain.  Visit your dentist if you have not gone yet during your pregnancy. Use a soft toothbrush to brush your teeth and be gentle when you floss.  A sexual relationship may be continued unless your health care provider directs you otherwise.  Continue to go to all your prenatal visits as directed by your health care provider. SEEK MEDICAL CARE IF:   You have dizziness.  You have mild pelvic cramps, pelvic pressure, or nagging pain in the abdominal area.  You have persistent nausea, vomiting, or diarrhea.  You have a bad smelling vaginal discharge.  You have pain with urination. SEEK IMMEDIATE MEDICAL CARE IF:   You have a fever.  You are leaking fluid from your vagina.  You have spotting or bleeding from your vagina.  You have severe abdominal cramping or pain.  You have rapid weight gain or loss.  You have shortness of breath with chest pain.  You notice sudden or extreme swelling of your face, hands, ankles, feet, or legs.  You have not felt your baby move in over an hour.  You have severe headaches that do not go away with  medicine.  You have vision changes. Document Released: 12/03/2001 Document Revised: 12/14/2013 Document Reviewed: 02/09/2013 ExitCare Patient Information 2015 [employer]. This information is not intended to replace advice given to you by your health care provider. Make sure you discuss any questions you have with your health care provider.  

## 2015-09-09 ENCOUNTER — Inpatient Hospital Stay (HOSPITAL_COMMUNITY)
Admission: EM | Admit: 2015-09-09 | Discharge: 2015-09-12 | DRG: 781 | Disposition: A | Discharge status: Home / Self Care | Payer: Medicaid Other | Attending: Internal Medicine | Admitting: Internal Medicine

## 2015-09-09 DIAGNOSIS — N12 Tubulo-interstitial nephritis, not specified as acute or chronic: Secondary | ICD-10-CM | POA: Diagnosis present

## 2015-09-09 DIAGNOSIS — R109 Unspecified abdominal pain: Secondary | ICD-10-CM | POA: Diagnosis present

## 2015-09-09 DIAGNOSIS — Z803 Family history of malignant neoplasm of breast: Secondary | ICD-10-CM

## 2015-09-09 DIAGNOSIS — O2302 Infections of kidney in pregnancy, second trimester: Principal | ICD-10-CM | POA: Diagnosis present

## 2015-09-09 DIAGNOSIS — Z3A15 15 weeks gestation of pregnancy: Secondary | ICD-10-CM | POA: Diagnosis present

## 2015-09-09 DIAGNOSIS — Z833 Family history of diabetes mellitus: Secondary | ICD-10-CM

## 2015-09-09 DIAGNOSIS — D72829 Elevated white blood cell count, unspecified: Secondary | ICD-10-CM | POA: Diagnosis present

## 2015-09-09 DIAGNOSIS — O99012 Anemia complicating pregnancy, second trimester: Secondary | ICD-10-CM | POA: Diagnosis present

## 2015-09-09 DIAGNOSIS — O3482 Maternal care for other abnormalities of pelvic organs, second trimester: Secondary | ICD-10-CM | POA: Diagnosis present

## 2015-09-09 DIAGNOSIS — N8320 Unspecified ovarian cysts: Secondary | ICD-10-CM | POA: Diagnosis present

## 2015-09-09 DIAGNOSIS — D649 Anemia, unspecified: Secondary | ICD-10-CM | POA: Diagnosis present

## 2015-09-09 DIAGNOSIS — E876 Hypokalemia: Secondary | ICD-10-CM | POA: Diagnosis present

## 2015-09-09 DIAGNOSIS — N83201 Unspecified ovarian cyst, right side: Secondary | ICD-10-CM | POA: Diagnosis present

## 2015-09-09 NOTE — ED Provider Notes (Signed)
CSN: [id number]     Arrival date & time 09/09/15  2325 History   First MD Initiated Contact with Patient 09/09/15 2351     Chief Complaint  Patient presents with  . Flank Pain     (Consider location/radiation/quality/duration/timing/severity/associated sxs/prior Treatment) HPI [name] is a 34 y.o. [id number] @ [redacted]w[redacted]d gestation who presents to the ED with right flank pain, n/v that started earlier tonight. She reports urinary frequency. The pain radiates to the right side of her abdomen. Prenatal care per [facility]. Patient was evaluated here in the ED 08/14/15 and thought to have had a kidney stone. After IV fluids and medication for nausea the symptoms improved and she was to follow up with her OB. Patient has done well until today when these symptoms started. Hx of kidney stone 10 years ago while she was pregnant that required a stent. Urologist was in [place].    Past Medical History  Diagnosis Date  . Ovarian cyst   . Kidney stones   . Pregnant 08/09/2015   Past Surgical History  Procedure Laterality Date  . Kidney stents    . Lithotripsy    . Appendectomy     Family History  Problem Relation Age of Onset  . Cancer Mother     breast  . Diabetes Maternal Grandmother    Social History  Substance Use Topics  . Smoking status: Never Smoker   . Smokeless tobacco: Never Used  . Alcohol Use: No   OB History    Gravida Para Term Preterm AB TAB SAB Ectopic Multiple Living   Review of Systems  Gastrointestinal: Positive for nausea, vomiting and abdominal pain.  Genitourinary: Positive for frequency, hematuria and flank pain.       Pregnant  all other systems negative    Allergies  Review of patient's allergies indicates no known allergies.  Home Medications   Prior to Admission medications   Medication Sig Start Date End Date Taking? Authorizing Provider  acetaminophen (TYLENOL) 500 MG tablet Take 500 mg by mouth every 6 (six) hours as  needed for mild pain or moderate pain.    Historical Provider, MD  calcium carbonate (TUMS - DOSED IN MG ELEMENTAL CALCIUM) 500 MG chewable tablet Chew 1 tablet by mouth daily as needed for indigestion or heartburn.    Historical Provider, MD  Cimetidine (HEARTBURN RELIEF PO) Take by mouth.    Historical Provider, MD  ibuprofen (ADVIL,MOTRIN) 200 MG tablet Take 200 mg by mouth every 6 (six) hours as needed for mild pain or moderate pain.    Historical Provider, MD  prenatal vitamin w/FE, FA (PRENATAL 1 + 1) 27-1 MG TABS tablet Take 1 tablet by mouth daily at 12 noon. 08/30/15   Cheral Marker, CNM   BP 131/71 mmHg  Pulse 103  Temp(Src) 98.8 F (37.1 C) (Oral)  Resp 24  Ht  (1.6 m)  Wt 152 lb (68.947 kg)  BMI 26.93 kg/m2  SpO2 99%  LMP 06/11/2015 (Approximate) Physical Exam  Constitutional: She is oriented to person, place, and time. She appears well-developed and well-nourished.  HENT:  Head: Normocephalic.  Eyes: Conjunctivae and EOM are normal.  Neck: Normal range of motion. Neck supple.  Cardiovascular: Normal rate.   Pulmonary/Chest: Effort normal.  Abdominal: Soft. Bowel sounds are normal. There is tenderness in the right lower quadrant. There is no rebound and no guarding.  Doppler FHT's 160  Right flank pain  Genitourinary:  External genitalia without lesions, white d/c vaginal vault, no bleeding noted, cervix 1 cm. Tender right adnexa. Uterus 16 week size.   Musculoskeletal: Normal range of motion.  Neurological: She is alert and oriented to person, place, and time. No cranial nerve deficit.  Skin: Skin is warm and dry.  Psychiatric: She has a normal mood and affect. Her behavior is normal.  Nursing note and vitals reviewed.   ED Course  Procedures (including critical care time) Labs Review Results for orders placed or performed during the hospital encounter of 09/09/15 (from the past 24 hour(s))  Urinalysis, Routine w reflex microscopic (not at [facility])     Status:  Abnormal   Collection Time: 09/10/15 12:05 AM  Result Value Ref Range   Color, Urine YELLOW YELLOW   APPearance HAZY (A) CLEAR   Specific Gravity, Urine 1.015 1.005 - 1.030   pH 7.5 5.0 - 8.0   Glucose, UA NEGATIVE NEGATIVE mg/dL   Hgb urine dipstick LARGE (A) NEGATIVE   Bilirubin Urine NEGATIVE NEGATIVE   Ketones, ur NEGATIVE NEGATIVE mg/dL   Protein, ur NEGATIVE NEGATIVE mg/dL   Urobilinogen, UA 0.2 0.0 - 1.0 mg/dL   Nitrite NEGATIVE NEGATIVE   Leukocytes, UA MODERATE (A) NEGATIVE  Urine microscopic-add on     Status: Abnormal   Collection Time: 09/10/15 12:05 AM  Result Value Ref Range   Squamous Epithelial / LPF FEW (A) RARE   WBC, UA 7-10 <3 WBC/hpf   RBC / HPF TOO NUMEROUS TO COUNT <3 RBC/hpf   Bacteria, UA MANY (A) RARE  Wet prep, genital     Status: Abnormal   Collection Time: 09/10/15  1:42 AM  Result Value Ref Range   Yeast Wet Prep HPF POC NONE SEEN NONE SEEN   Trich, Wet Prep NONE SEEN NONE SEEN   Clue Cells Wet Prep HPF POC NONE SEEN NONE SEEN   WBC, Wet Prep HPF POC MANY (A) NONE SEEN  CBC with Differential     Status: Abnormal   Collection Time: 09/10/15  2:01 AM  Result Value Ref Range   WBC 11.7 (H) 4.0 - 10.5 K/uL   RBC 3.89 3.87 - 5.11 MIL/uL   Hemoglobin 11.1 (L) 12.0 - 15.0 g/dL   HCT 16.1 (L) 09.6 - 04.5 %   MCV 84.6 78.0 - 100.0 fL   MCH 28.5 26.0 - 34.0 pg   MCHC 33.7 30.0 - 36.0 g/dL   RDW 40.9 81.1 - 91.4 %   Platelets 286 150 - 400 K/uL   Neutrophils Relative % 70 %   Neutro Abs 8.2 (H) 1.7 - 7.7 K/uL   Lymphocytes Relative 23 %   Lymphs Abs 2.7 0.7 - 4.0 K/uL   Monocytes Relative 5 %   Monocytes Absolute 0.6 0.1 - 1.0 K/uL   Eosinophils Relative 2 %   Eosinophils Absolute 0.2 0.0 - 0.7 K/uL   Basophils Relative 0 %   Basophils Absolute 0.0 0.0 - 0.1 K/uL    I discussed this case with Dr. Penne Lash at [facility] and she feels that the patient should be admitted here at [facility] since she is only [redacted] weeks gestation and not at the  point of viability.   MDM  34 y.o. [id number] @ [redacted]w[redacted]d gestation with right flank pain that radiates to the right lower abdomen. Awaiting lab results. Resting well after Phenergan IV for nausea. Dr. Elesa Massed to assume care of the patient @ 0200 am.  [place], NP 09/10/15 [address] Orlene Och, NP 09/10/15 0222  Layla Maw Ward, DO 09/10/15 [phone]

## 2015-09-09 NOTE — ED Notes (Signed)
Flank pain radiating into the front, frequency urination, no burning, [redacted] weeks pregnant

## 2015-09-10 ENCOUNTER — Encounter (HOSPITAL_COMMUNITY): Payer: Self-pay | Admitting: Emergency Medicine

## 2015-09-10 ENCOUNTER — Inpatient Hospital Stay (HOSPITAL_COMMUNITY): Payer: Medicaid Other

## 2015-09-10 DIAGNOSIS — N12 Tubulo-interstitial nephritis, not specified as acute or chronic: Secondary | ICD-10-CM | POA: Diagnosis not present

## 2015-09-10 DIAGNOSIS — O99012 Anemia complicating pregnancy, second trimester: Secondary | ICD-10-CM | POA: Diagnosis present

## 2015-09-10 DIAGNOSIS — N8320 Unspecified ovarian cysts: Secondary | ICD-10-CM | POA: Diagnosis present

## 2015-09-10 DIAGNOSIS — D649 Anemia, unspecified: Secondary | ICD-10-CM | POA: Diagnosis present

## 2015-09-10 DIAGNOSIS — O3482 Maternal care for other abnormalities of pelvic organs, second trimester: Secondary | ICD-10-CM | POA: Diagnosis present

## 2015-09-10 DIAGNOSIS — Z833 Family history of diabetes mellitus: Secondary | ICD-10-CM | POA: Diagnosis not present

## 2015-09-10 DIAGNOSIS — Z3A15 15 weeks gestation of pregnancy: Secondary | ICD-10-CM | POA: Diagnosis present

## 2015-09-10 DIAGNOSIS — E876 Hypokalemia: Secondary | ICD-10-CM | POA: Diagnosis present

## 2015-09-10 DIAGNOSIS — O2302 Infections of kidney in pregnancy, second trimester: Secondary | ICD-10-CM | POA: Diagnosis present

## 2015-09-10 DIAGNOSIS — Z803 Family history of malignant neoplasm of breast: Secondary | ICD-10-CM | POA: Diagnosis not present

## 2015-09-10 DIAGNOSIS — R109 Unspecified abdominal pain: Secondary | ICD-10-CM | POA: Diagnosis not present

## 2015-09-10 LAB — CBC WITH DIFFERENTIAL/PLATELET
Basophils Absolute: 0 10*3/uL (ref 0.0–0.1)
Basophils Relative: 0 %
EOS ABS: 0.2 10*3/uL (ref 0.0–0.7)
Eosinophils Relative: 2 %
HCT: 32.9 % — ABNORMAL LOW (ref 36.0–46.0)
HEMOGLOBIN: 11.1 g/dL — AB (ref 12.0–15.0)
LYMPHS ABS: 2.7 10*3/uL (ref 0.7–4.0)
Lymphocytes Relative: 23 %
MCH: 28.5 pg (ref 26.0–34.0)
MCHC: 33.7 g/dL (ref 30.0–36.0)
MCV: 84.6 fL (ref 78.0–100.0)
MONOS PCT: 5 %
Monocytes Absolute: 0.6 10*3/uL (ref 0.1–1.0)
NEUTROS PCT: 70 %
Neutro Abs: 8.2 10*3/uL — ABNORMAL HIGH (ref 1.7–7.7)
Platelets: 286 10*3/uL (ref 150–400)
RBC: 3.89 MIL/uL (ref 3.87–5.11)
RDW: 13.6 % (ref 11.5–15.5)
WBC: 11.7 10*3/uL — ABNORMAL HIGH (ref 4.0–10.5)

## 2015-09-10 LAB — BASIC METABOLIC PANEL
Anion gap: 8 (ref 5–15)
BUN: 5 mg/dL — AB (ref 6–20)
CHLORIDE: 107 mmol/L (ref 101–111)
CO2: 22 mmol/L (ref 22–32)
CREATININE: 0.57 mg/dL (ref 0.44–1.00)
Calcium: 8.4 mg/dL — ABNORMAL LOW (ref 8.9–10.3)
GFR calc Af Amer: >60 mL/min (ref >60)
GFR calc non Af Amer: >60 mL/min (ref >60)
GLUCOSE: 101 mg/dL — AB (ref 65–99)
Potassium: 3.3 mmol/L — ABNORMAL LOW (ref 3.5–5.1)
SODIUM: 137 mmol/L (ref 135–145)

## 2015-09-10 LAB — WET PREP, GENITAL
CLUE CELLS WET PREP: NONE SEEN
TRICH WET PREP: NONE SEEN
YEAST WET PREP: NONE SEEN

## 2015-09-10 LAB — URINALYSIS, ROUTINE W REFLEX MICROSCOPIC
BILIRUBIN URINE: NEGATIVE
Glucose, UA: NEGATIVE mg/dL
Ketones, ur: NEGATIVE mg/dL
NITRITE: NEGATIVE
PH: 7.5 (ref 5.0–8.0)
Protein, ur: NEGATIVE mg/dL
SPECIFIC GRAVITY, URINE: 1.015 (ref 1.005–1.030)
Urobilinogen, UA: 0.2 mg/dL (ref 0.0–1.0)

## 2015-09-10 LAB — URINE MICROSCOPIC-ADD ON

## 2015-09-10 MED ORDER — LACTATED RINGERS IV BOLUS (SEPSIS)
500.0000 mL | Freq: Once | INTRAVENOUS | Status: AC
  Administered 2015-09-10: 500 mL via INTRAVENOUS

## 2015-09-10 MED ORDER — LACTATED RINGERS IV BOLUS (SEPSIS)
1000.0000 mL | Freq: Once | INTRAVENOUS | Status: AC
  Administered 2015-09-10: 1000 mL via INTRAVENOUS

## 2015-09-10 MED ORDER — CALCIUM CARBONATE ANTACID 500 MG PO CHEW: 1.0000 | CHEWABLE_TABLET | Freq: Every day | ORAL | Status: DC | PRN

## 2015-09-10 MED ORDER — ACETAMINOPHEN 500 MG PO TABS
500.0000 mg | ORAL_TABLET | Freq: Four times a day (QID) | ORAL | Status: DC | PRN
  Administered 2015-09-10 (×2): 500 mg via ORAL
  Filled 2015-09-10 (×2): qty 1

## 2015-09-10 MED ORDER — IBUPROFEN 400 MG PO TABS
200.0000 mg | ORAL_TABLET | Freq: Four times a day (QID) | ORAL | Status: DC | PRN
  Administered 2015-09-10 – 2015-09-11 (×3): 200 mg via ORAL
  Filled 2015-09-10 (×3): qty 1

## 2015-09-10 MED ORDER — POTASSIUM CHLORIDE IN NACL 20-0.9 MEQ/L-% IV SOLN
INTRAVENOUS | Status: DC
  Administered 2015-09-10 – 2015-09-11 (×3): via INTRAVENOUS

## 2015-09-10 MED ORDER — DEXTROSE 5 % IV SOLN
1.0000 g | Freq: Once | INTRAVENOUS | Status: AC
  Administered 2015-09-10: 1 g via INTRAVENOUS
  Filled 2015-09-10: qty 10

## 2015-09-10 MED ORDER — PRENATAL PLUS 27-1 MG PO TABS
1.0000 | ORAL_TABLET | Freq: Every day | ORAL | Status: DC
Start: 2015-09-10 — End: 2015-09-12
  Administered 2015-09-10 – 2015-09-11 (×2): 1 via ORAL
  Filled 2015-09-10 (×4): qty 1

## 2015-09-10 MED ORDER — PROMETHAZINE HCL 25 MG/ML IJ SOLN
12.5000 mg | Freq: Once | INTRAMUSCULAR | Status: AC
  Administered 2015-09-10: 12.5 mg via INTRAVENOUS

## 2015-09-10 MED ORDER — HYDROCODONE-ACETAMINOPHEN 5-325 MG PO TABS
1.0000 | ORAL_TABLET | ORAL | Status: DC | PRN
Start: 2015-09-10 — End: 2015-09-12
  Administered 2015-09-10 – 2015-09-11 (×8): 1 via ORAL
  Filled 2015-09-10 (×9): qty 1

## 2015-09-10 MED ORDER — PROMETHAZINE HCL 25 MG/ML IJ SOLN
12.5000 mg | Freq: Four times a day (QID) | INTRAMUSCULAR | Status: DC | PRN
  Administered 2015-09-10 – 2015-09-12 (×7): 12.5 mg via INTRAVENOUS
  Filled 2015-09-10 (×7): qty 1

## 2015-09-10 MED ORDER — PROMETHAZINE HCL 25 MG/ML IJ SOLN
INTRAMUSCULAR | Status: AC
Start: 2015-09-10 — End: 2015-09-10
  Administered 2015-09-10: 12.5 mg via INTRAVENOUS
  Filled 2015-09-10: qty 1

## 2015-09-10 MED ORDER — PROMETHAZINE HCL 12.5 MG PO TABS: 12.5000 mg | ORAL_TABLET | Freq: Four times a day (QID) | ORAL | Status: DC | PRN

## 2015-09-10 MED ORDER — FAMOTIDINE 20 MG PO TABS
20.0000 mg | ORAL_TABLET | Freq: Every day | ORAL | Status: DC
  Administered 2015-09-10 – 2015-09-12 (×3): 20 mg via ORAL
  Filled 2015-09-10 (×3): qty 1

## 2015-09-10 MED ORDER — DEXTROSE 5 % IV SOLN
1.0000 g | Freq: Two times a day (BID) | INTRAVENOUS | Status: DC
  Administered 2015-09-10 – 2015-09-12 (×4): 1 g via INTRAVENOUS
  Filled 2015-09-10 (×6): qty 10

## 2015-09-10 MED ORDER — CIMETIDINE 200 MG PO TABS
200.0000 mg | ORAL_TABLET | Freq: Three times a day (TID) | ORAL | Status: DC
  Filled 2015-09-10 (×4): qty 1

## 2015-09-10 NOTE — Progress Notes (Signed)
The patient is a 34 year old woman who is [redacted] weeks pregnant, who was admitted this morning by Dr. Conley Rolls for right flank pain secondary to acute pyelonephritis. The patient was briefly seen and examined. Her chart, vital signs, laboratory studies were reviewed. Agree with current management, but will add IV fluids with potassium chloride added. Will change her Phenergan to IV as needed. Will add oral potassium chloride when she is no longer nauseated.

## 2015-09-10 NOTE — H&P (Signed)
[facility] History and Physical  [name] ZOX:[id number] DOB: [birth year]    PCP:   Lazaro Arms, MD   Chief Complaint: right flank pain and polyuria in a [redacted] weeks pregnant female.   HPI: [name] is an 34 y.o. female [redacted] week pregnant ([id number]), hx of prior nephrolithiasis with lipthotrypsy, hx of APPx, presented to the ER with right flank pain for about one week.  She denied vaginal discharge, fever, chills, but had nausea and vomiting.  She has had numerous prior CTs but most did not show kidney stones.  Evaluation in the ER showed UA with moderate leukocytes, 7-10 WBCs, and TNTC RBCs with many bacteria.  Her WBC is 11.2K, Hb of 11 g per dL, and normal renal fx.   EDP spoke with physician on call at [facility], and it was recommnended that she be admitted here, and give Rocephin 1 g Q12 hours.  She is not having any significant pain here.  Her pelvic exam was performed by [facility], APP, and cultures were obtained.    Rewiew of Systems:  Constitutional: Negative for malaise, fever and chills. No significant weight loss or weight gain Eyes: Negative for eye pain, redness and discharge, diplopia, visual changes, or flashes of light. ENMT: Negative for ear pain, hoarseness, nasal congestion, sinus pressure and sore throat. No headaches; tinnitus, drooling, or problem swallowing. Cardiovascular: Negative for chest pain, palpitations, diaphoresis, dyspnea and peripheral edema. ; No orthopnea, PND Respiratory: Negative for cough, hemoptysis, wheezing and stridor. No pleuritic chestpain. Gastrointestinal: Negative for nausea, vomiting, diarrhea, constipation, abdominal pain, melena, blood in stool, hematemesis, jaundice and rectal bleeding.    Genitourinary: Negative for frequency, incontinence,flank pain and hematuria; Musculoskeletal: Negative for back pain and neck pain. Negative for swelling and trauma.;  Skin: . Negative for pruritus, rash, abrasions, bruising and skin  lesion.; ulcerations Neuro: Negative for headache, lightheadedness and neck stiffness. Negative for weakness, altered level of consciousness , altered mental status, extremity weakness, burning feet, involuntary movement, seizure and syncope.  Psych: negative for anxiety, depression, insomnia, tearfulness, panic attacks, hallucinations, paranoia, suicidal or homicidal ideation    Past Medical History  Diagnosis Date  . Ovarian cyst   . Kidney stones   . Pregnant 08/09/2015    Past Surgical History  Procedure Laterality Date  . Kidney stents    . Lithotripsy    . Appendectomy      Medications:  HOME MEDS: Prior to Admission medications   Medication Sig Start Date End Date Taking? Authorizing Provider  acetaminophen (TYLENOL) 500 MG tablet Take 500 mg by mouth every 6 (six) hours as needed for mild pain or moderate pain.    Historical Provider, MD  calcium carbonate (TUMS - DOSED IN MG ELEMENTAL CALCIUM) 500 MG chewable tablet Chew 1 tablet by mouth daily as needed for indigestion or heartburn.    Historical Provider, MD  Cimetidine (HEARTBURN RELIEF PO) Take by mouth.    Historical Provider, MD  ibuprofen (ADVIL,MOTRIN) 200 MG tablet Take 200 mg by mouth every 6 (six) hours as needed for mild pain or moderate pain.    Historical Provider, MD  prenatal vitamin w/FE, FA (PRENATAL 1 + 1) 27-1 MG TABS tablet Take 1 tablet by mouth daily at 12 noon. 08/30/15   Cheral Marker, CNM     Allergies:  No Known Allergies  Social History:   reports that she has never smoked. She has never used smokeless tobacco. She reports that she does not drink alcohol or  use illicit drugs.  Family History: Family History  Problem Relation Age of Onset  . Cancer Mother     breast  . Diabetes Maternal Grandmother      Physical Exam: Filed Vitals:   09/10/15 0007 09/10/15 0254  BP: 131/71 122/68  Pulse: 103 87  Temp: 98.8 F (37.1 C)   TempSrc: Oral   Resp: 24 22  Height:  (1.6 m)    Weight: 68.947 kg (152 lb)   SpO2: 99% 100%   Blood pressure 122/68, pulse 87, temperature 98.8 F (37.1 C), temperature source Oral, resp. rate 22, height  (1.6 m), weight 68.947 kg (152 lb), last menstrual period 06/11/2015, SpO2 100 %, not currently breastfeeding.  GEN:  Pleasant patient lying in the stretcher in no acute distress; cooperative with exam. PSYCH:  alert and oriented x4; does not appear anxious or depressed; affect is appropriate. HEENT: Mucous membranes pink and anicteric; PERRLA; EOM intact; no cervical lymphadenopathy nor thyromegaly or carotid bruit; no JVD; There were no stridor. Neck is very supple. Breasts:: Not examined CHEST WALL: No tenderness CHEST: Normal respiration, clear to auscultation bilaterally.  HEART: Regular rate and rhythm.  There are no murmur, rub, or gallops.   BACK: No kyphosis or scoliosis; no CVA tenderness ABDOMEN: soft and non-tender; no masses, no organomegaly, normal abdominal bowel sounds; no pannus; no intertriginous candida. There is no rebound and no distention.  Fundal height is appropriate.  Rectal Exam: Not done EXTREMITIES: No bone or joint deformity; age-appropriate arthropathy of the hands and knees; no edema; no ulcerations.  There is no calf tenderness. Genitalia: not examined PULSES: 2+ and symmetric SKIN: Normal hydration no rash or ulceration CNS: Cranial nerves 2-12 grossly intact no focal lateralizing neurologic deficit.  Speech is fluent; uvula elevated with phonation, facial symmetry and tongue midline. DTR are normal bilaterally, cerebella exam is intact, barbinski is negative and strengths are equaled bilaterally.  No sensory loss.   Labs on Admission:  Basic Metabolic Panel:  Recent Labs Lab 09/10/15 0201  NA 137  K 3.3*  CL 107  CO2 22  GLUCOSE 101*  BUN 5*  CREATININE 0.57  CALCIUM 8.4*    Recent Labs Lab 09/10/15 0201  WBC 11.7*  NEUTROABS 8.2*  HGB 11.1*  HCT 32.9*  MCV 84.6  PLT 286    Radiological Exams on Admission:  EKG: Independently reviewed.   Assessment/Plan Present on Admission:  . Right ovarian cyst pyeloneprhtitis 15 week Pregnancy.  PLAN:  Will admit her for pyelonephritis in first trimester pregnancy female with hx of prior nephrolithiasis.  Will continue at the recommended dose of Rocephin 1 g IV Q 12 hours.  Have obtained Blood and urine culture.  Will follow her BP closely.  I have continued her outpatient medication including tylenol, advil, MVI, and phernergan PRN for nausea.   She is stable, full code, and will be admitted to [facility] service.  Thank you so much and Good day.   Other plans as per orders.  Code Status:FULL CODE.    Houston Siren, MD. [facility] Pager [phone] 7pm to 7am.  09/10/2015, 3:04 AM

## 2015-09-11 DIAGNOSIS — R109 Unspecified abdominal pain: Secondary | ICD-10-CM | POA: Diagnosis present

## 2015-09-11 DIAGNOSIS — D72829 Elevated white blood cell count, unspecified: Secondary | ICD-10-CM | POA: Diagnosis present

## 2015-09-11 DIAGNOSIS — D649 Anemia, unspecified: Secondary | ICD-10-CM | POA: Diagnosis present

## 2015-09-11 DIAGNOSIS — E876 Hypokalemia: Secondary | ICD-10-CM | POA: Diagnosis present

## 2015-09-11 HISTORY — DX: Anemia, unspecified: D64.9

## 2015-09-11 LAB — BASIC METABOLIC PANEL
ANION GAP: 4 — AB (ref 5–15)
BUN: <5 mg/dL — ABNORMAL LOW (ref 6–20)
CALCIUM: 7.5 mg/dL — AB (ref 8.9–10.3)
CO2: 21 mmol/L — AB (ref 22–32)
Chloride: 110 mmol/L (ref 101–111)
Creatinine, Ser: 0.47 mg/dL (ref 0.44–1.00)
Glucose, Bld: 96 mg/dL (ref 65–99)
Potassium: 3.8 mmol/L (ref 3.5–5.1)
SODIUM: 135 mmol/L (ref 135–145)

## 2015-09-11 LAB — CBC
HCT: 30.4 % — ABNORMAL LOW (ref 36.0–46.0)
HEMOGLOBIN: 10.4 g/dL — AB (ref 12.0–15.0)
MCH: 29.6 pg (ref 26.0–34.0)
MCHC: 34.2 g/dL (ref 30.0–36.0)
MCV: 86.6 fL (ref 78.0–100.0)
Platelets: 244 10*3/uL (ref 150–400)
RBC: 3.51 MIL/uL — AB (ref 3.87–5.11)
RDW: 13.7 % (ref 11.5–15.5)
WBC: 7.5 10*3/uL (ref 4.0–10.5)

## 2015-09-11 LAB — GC/CHLAMYDIA PROBE AMP (~~LOC~~) NOT AT ARMC
CHLAMYDIA, DNA PROBE: NEGATIVE
NEISSERIA GONORRHEA: NEGATIVE

## 2015-09-11 MED ORDER — MORPHINE SULFATE (PF) 2 MG/ML IV SOLN
2.0000 mg | Freq: Four times a day (QID) | INTRAVENOUS | Status: DC | PRN
Start: 2015-09-11 — End: 2015-09-12
  Administered 2015-09-11 – 2015-09-12 (×3): 2 mg via INTRAVENOUS
  Filled 2015-09-11 (×3): qty 1

## 2015-09-11 NOTE — Plan of Care (Signed)
Problem: Phase I Progression Outcomes Goal: Pain controlled with appropriate interventions Outcome: Not Progressing Pt states her pain is not changing much even with the use of pain medications.  Goal: Hemodynamically stable Outcome: Progressing See flowsheet.

## 2015-09-11 NOTE — Care Management Note (Signed)
Case Management Note  Patient Details  Name: [name] MRN: [id number] Date of Birth: 1981/10/08  Expected Discharge Date:   09/13/2015               Expected Discharge Plan:  Home/Self Care  In-House Referral:  NA  Discharge planning Services  CM Consult  Post Acute Care Choice:  NA Choice offered to:  NA  DME Arranged:    DME Agency:     HH Arranged:    HH Agency:     Status of Service:  Completed, signed off  Medicare Important Message Given:    Date Medicare IM Given:    Medicare IM give by:    Date Additional Medicare IM Given:    Additional Medicare Important Message give by:     If discussed at Long Length of Stay Meetings, dates discussed:    Additional Comments: Admitted with pyelo. Pt is from home with family and ind at baseline. Pt is pregnant. Pt has no HH services or DME needs prior to admission. Pt will DC home with self care. No CM needs noted.  Malcolm Metro, RN 09/11/2015, 12:39 PM

## 2015-09-11 NOTE — Progress Notes (Signed)
[facility] PROGRESS NOTE  Takela Varden Virgil ZOX:[id number] DOB: 1981-01-24 DOA: 09/09/2015 PCP: Lazaro Arms, MD  Assessment/Plan: 1. Right flank pain/abdominal pain: likely related to pyelonephritis. Reports some improvement this am. Good pain control with current regimen.  2. Pyelonephritis: hx of same. Leukocytosis resolved. She is afebrile and non-toxic appearing but appears somewhat weak. Continue rocephin day #2. Await urine culture.  Urine output good. Continue gentle IV fluids as oral intake unreliable still.  3. Pregnant: 15 weeks. Case discussed on admission with [facility] on call MD per ED MD. Recommendation to admit with and provide rocephin. Wet prep with many WBC otherwise unremarkable.  4. Hypokalemia: mild. Continue IV supplement. Monitor 5. Anemia: mild. No s/sx bleeding. OP follow up   Code Status: full Family Communication: husband at bedside Disposition Plan: home hopefully tomorrow   Consultants:  none  Procedures:  none  Antibiotics:  Rocephin 09/10/15>>  HPI/Subjective: Lying in bed. Reports continued right flank pain radiating to lower right abdomen but "a little better". Continued nausea but no vomiting. Able to eat small amounts  Objective: Filed Vitals:   09/11/15 0503  BP: 102/44  Pulse: 87  Temp: 98.4 F (36.9 C)  Resp: 20    Intake/Output Summary (Last 24 hours) at 09/11/15 1303 Last data filed at 09/11/15 0553  Gross per 24 hour  Intake 2218.83 ml  Output      0 ml  Net 2218.83 ml   Filed Weights   09/10/15 0007 09/10/15 0509  Weight: 68.947 kg (152 lb) 71.714 kg (158 lb 1.6 oz)    Exam:   General:  Well nourished no acute distress  Cardiovascular: RRR no MGR no LE edema  Respiratory: normal effort BS clear bilaterally no wheeze  Abdomen: non-distended, mild tenderness RLQ. No rebounding +BS throughout  Musculoskeletal: no clubbing or cyanosis   Data Reviewed: Basic Metabolic Panel:  Recent Labs Lab  09/10/15 0201 09/11/15 0716  NA 137 135  K 3.3* 3.8  CL 107 110  CO2 22 21*  GLUCOSE 101* 96  BUN 5* <5*  CREATININE 0.57 0.47  CALCIUM 8.4* 7.5*   Liver Function Tests: No results for input(s): AST, ALT, ALKPHOS, BILITOT, PROT, ALBUMIN in the last 168 hours. No results for input(s): LIPASE, AMYLASE in the last 168 hours. No results for input(s): AMMONIA in the last 168 hours. CBC:  Recent Labs Lab 09/10/15 0201 09/11/15 0716  WBC 11.7* 7.5  NEUTROABS 8.2*  --   HGB 11.1* 10.4*  HCT 32.9* 30.4*  MCV 84.6 86.6  PLT 286 244   Cardiac Enzymes: No results for input(s): CKTOTAL, CKMB, CKMBINDEX, TROPONINI in the last 168 hours. BNP (last 3 results) No results for input(s): BNP in the last 8760 hours.  ProBNP (last 3 results) No results for input(s): PROBNP in the last 8760 hours.  CBG: No results for input(s): GLUCAP in the last 168 hours.  Recent Results (from the past 240 hour(s))  Wet prep, genital     Status: Abnormal   Collection Time: 09/10/15  1:42 AM  Result Value Ref Range Status   Yeast Wet Prep HPF POC NONE SEEN NONE SEEN Final   Trich, Wet Prep NONE SEEN NONE SEEN Final   Clue Cells Wet Prep HPF POC NONE SEEN NONE SEEN Final   WBC, Wet Prep HPF POC MANY (A) NONE SEEN Final     Studies: US Renal  09/10/2015   CLINICAL DATA:  Fifteen weeks pregnant with pyelonephritis.  EXAM: RENAL / URINARY TRACT ULTRASOUND  COMPLETE  COMPARISON:  None.  FINDINGS: Right Kidney:  Length: 13.0 cm. Normal renal cortical thickness and echogenicity without focal lesions or hydronephrosis.  Left Kidney:  Length: 13.4 cm. Normal renal cortical thickness and echogenicity without focal lesions or hydronephrosis.  Bladder:  Appears normal for degree of bladder distention. Right ureteral jet noted.  IMPRESSION: Normal renal ultrasound examination.   Electronically Signed   By: Rudie Meyer M.D.   On: 09/10/2015 11:35    Scheduled Meds: . cefTRIAXone (ROCEPHIN)  IV  1 g Intravenous  Q12H  . famotidine  20 mg Oral Daily  . prenatal vitamin w/FE, FA  1 tablet Oral Q1200   Continuous Infusions: . 0.9 % NaCl with KCl 20 mEq / L 70 mL/hr at 09/11/15 0025    Principal Problem:   Pyelonephritis Active Problems:   Right ovarian cyst   Pregnancy   Abdominal pain   Rt flank pain   Hypokalemia   Leukocytosis   Anemia    Time spent: 30 minutes    [facility] M  [facility] Pager [phone]. If 7PM-7AM, please contact night-coverage at [email or web address], password [facility] 09/11/2015, 1:03 PM  LOS: 1 day

## 2015-09-12 DIAGNOSIS — N8320 Unspecified ovarian cysts: Secondary | ICD-10-CM

## 2015-09-12 LAB — CBC
HEMATOCRIT: 31.8 % — AB (ref 36.0–46.0)
HEMOGLOBIN: 10.2 g/dL — AB (ref 12.0–15.0)
MCH: 27.6 pg (ref 26.0–34.0)
MCHC: 32.1 g/dL (ref 30.0–36.0)
MCV: 85.9 fL (ref 78.0–100.0)
Platelets: 270 10*3/uL (ref 150–400)
RBC: 3.7 MIL/uL — AB (ref 3.87–5.11)
RDW: 13.7 % (ref 11.5–15.5)
WBC: 8.5 10*3/uL (ref 4.0–10.5)

## 2015-09-12 LAB — URINE CULTURE

## 2015-09-12 LAB — BASIC METABOLIC PANEL
ANION GAP: 5 (ref 5–15)
BUN: <5 mg/dL — ABNORMAL LOW (ref 6–20)
CALCIUM: 7.8 mg/dL — AB (ref 8.9–10.3)
CO2: 24 mmol/L (ref 22–32)
Chloride: 108 mmol/L (ref 101–111)
Creatinine, Ser: 0.44 mg/dL (ref 0.44–1.00)
Glucose, Bld: 90 mg/dL (ref 65–99)
POTASSIUM: 3.7 mmol/L (ref 3.5–5.1)
Sodium: 137 mmol/L (ref 135–145)

## 2015-09-12 MED ORDER — PRENATAL PLUS 27-1 MG PO TABS: 1.0000 | ORAL_TABLET | Freq: Every day | ORAL | Status: DC

## 2015-09-12 MED ORDER — CEFUROXIME AXETIL 500 MG PO TABS: 500.0000 mg | ORAL_TABLET | Freq: Two times a day (BID) | ORAL | Status: DC

## 2015-09-12 MED ORDER — PROMETHAZINE HCL 12.5 MG PO TABS: 12.5000 mg | ORAL_TABLET | Freq: Four times a day (QID) | ORAL | Status: DC | PRN

## 2015-09-12 MED ORDER — HYDROCODONE-ACETAMINOPHEN 5-325 MG PO TABS: 1.0000 | ORAL_TABLET | ORAL | Status: DC | PRN

## 2015-09-12 NOTE — Care Management Note (Signed)
Case Management Note  Patient Details  Name: ELISKA HAMIL MRN: [id number] Date of Birth: [birth year]  Expected Discharge Date:                  Expected Discharge Plan:  Home/Self Care  In-House Referral:  NA  Discharge planning Services  CM Consult  Post Acute Care Choice:  NA Choice offered to:  NA  DME Arranged:    DME Agency:     HH Arranged:    HH Agency:     Status of Service:  Completed, signed off  Medicare Important Message Given:    Date Medicare IM Given:    Medicare IM give by:    Date Additional Medicare IM Given:    Additional Medicare Important Message give by:     If discussed at Long Length of Stay Meetings, dates discussed:    Additional Comments: Pt discharged home today with self care. No CM needs.  Malcolm Metro, RN 09/12/2015, 11:34 AM

## 2015-09-12 NOTE — Progress Notes (Signed)
Discharge instructions and prescriptions given, verbalized understanding, out in stable condition via w/c with staff. 

## 2015-09-12 NOTE — Discharge Summary (Signed)
Physician Discharge Summary  [name] ZOX:[id number] DOB: 1981-02-14 DOA: 09/09/2015  PCP: Melissa Arms, [facility]  Admit date: 09/09/2015 Discharge date: 09/12/2015  Time spent: 40 minutes  Recommendations for Outpatient Follow-up:  1. Follow up with Dr [name] 1 week as scheduled   Discharge Diagnoses:  Principal Problem:   Pyelonephritis Active Problems:   Right ovarian cyst   Pregnancy   Abdominal pain   Rt flank pain   Hypokalemia   Leukocytosis   Anemia   Discharge Condition: stable  Diet recommendation: regular  Filed Weights   09/10/15 0007 09/10/15 0509  Weight: 68.947 kg (152 lb) 71.714 kg (158 lb 1.6 oz)    History of present illness:  [name] is a 34 y.o. female [redacted] week pregnant ([id number]), hx of prior nephrolithiasis with lipthotrypsy, hx of APPx, presented to the ER on 9/18 with right flank pain for about one week. She denied vaginal discharge, fever, chills, but had nausea and vomiting. She had numerous prior CTs but most did not show kidney stones. Evaluation in the ER showed UA with moderate leukocytes, 7-10 WBCs, and TNTC RBCs with many bacteria. Her WBC was 11.2K, Hb of 11 g per dL, and normal renal fx. EDP spoke with physician on call at [facility], and it was recommnended that she be admitted to [facility], and give Rocephin 1 g Q12 hours. She was not having any significant pain. Her pelvic exam was performed by [name], APP, and cultures were obtained.   [facility] Course:  1. Right flank pain/abdominal pain: likely related to pyelonephritis. Provided with antibiotic and analgesia with good relief. At discharge pain improved and managed with oral medication. Follow up with Dr [name] as scheduled.   2. Pyelonephritis: hx of same. Renal ultra sound unremarkable.  Leukocytosis resolved at discharge. She she remained afebrile and non-toxic appearing. Received rocephin. Urine culture reincubated at discharge. Will discharge with Ceftin for 7 days.    3. Pregnant: 15 weeks. Case discussed on admission with [name] per ED [facility]. Has appointment with Dr [name] next week. Wet prep with many WBC otherwise unremarkable.  4. Hypokalemia: mild. Resolved at discharge.  5. Anemia: mild. No s/sx bleeding. Stable at discharge. Resuming pre natal vitamins at discharge.  OP follow up   Procedures:  Renal ultrasound 09/10/15 Normal renal ultrasound examination  Consultations:  none  Discharge Exam: Filed Vitals:   09/12/15 0515  BP: 109/51  Pulse: 86  Temp: 98.5 F (36.9 C)  Resp: 18    General: well nourished no acute distress Cardiovascular: RRR no MGR no LE edema Respiratory: normal effort BS clear bilaterally no wheeze Abdomen: soft sluggish BS non-tender to palpation  Discharge Instructions   Discharge Instructions    Diet - low sodium heart healthy    Complete by:  As directed      Increase activity slowly    Complete by:  As directed           Current Discharge Medication List    START taking these medications   Details  cefUROXime (CEFTIN) 500 MG tablet Take 1 tablet (500 mg total) by mouth 2 (two) times daily with a meal. Qty: 14 tablet, Refills: 0    HYDROcodone-acetaminophen (NORCO/VICODIN) 5-325 MG per tablet Take 1 tablet by mouth every 4 (four) hours as needed for moderate pain or severe pain. Qty: 20 tablet, Refills: 0    promethazine (PHENERGAN) 12.5 MG tablet Take 1 tablet (12.5 mg total) by mouth every 6 (six)  hours as needed for nausea or vomiting. Qty: 20 tablet, Refills: 0      CONTINUE these medications which have CHANGED   Details  prenatal vitamin w/FE, FA (PRENATAL 1 + 1) 27-1 MG TABS tablet Take 1 tablet by mouth daily at 12 noon. Qty: 30 each, Refills: 0      CONTINUE these medications which have NOT CHANGED   Details  acetaminophen (TYLENOL) 500 MG tablet Take 500 mg by mouth every 6 (six) hours as needed for mild pain or moderate pain.    calcium carbonate (TUMS - DOSED IN MG  ELEMENTAL CALCIUM) 500 MG chewable tablet Chew 1 tablet by mouth daily as needed for indigestion or heartburn.    ibuprofen (ADVIL,MOTRIN) 200 MG tablet Take 200 mg by mouth every 6 (six) hours as needed for mild pain or moderate pain.       No Known Allergies Follow-up Information    Follow up with Melissa Arms, [facility].   Specialties:  Obstetrics and Gynecology, Radiology   Why:  has appointment next week   Contact information:   [address] [place] [address] [phone]        The results of significant diagnostics from this hospitalization (including imaging, microbiology, ancillary and laboratory) are listed below for reference.    Significant Diagnostic Studies: US Renal  09/10/2015   CLINICAL DATA:  Fifteen weeks pregnant with pyelonephritis.  EXAM: RENAL / URINARY TRACT ULTRASOUND COMPLETE  COMPARISON:  None.  FINDINGS: Right Kidney:  Length: 13.0 cm. Normal renal cortical thickness and echogenicity without focal lesions or hydronephrosis.  Left Kidney:  Length: 13.4 cm. Normal renal cortical thickness and echogenicity without focal lesions or hydronephrosis.  Bladder:  Appears normal for degree of bladder distention. Right ureteral jet noted.  IMPRESSION: Normal renal ultrasound examination.   Electronically Signed   By: [name] M.D.   On: 09/10/2015 11:35   US Renal  08/17/2015   CLINICAL DATA:  Right flank pain, gross hematuria.  EXAM: RENAL / URINARY TRACT ULTRASOUND COMPLETE  COMPARISON:  Ultrasound of July 27, 2015.  FINDINGS: Right Kidney:  Length: 11.8 cm. Echogenicity within normal limits. No mass or hydronephrosis visualized.  Left Kidney:  Length: 13.9 cm. Echogenicity within normal limits. No mass or hydronephrosis visualized.  Bladder:  Not well visualized as it is completely decompressed.  IMPRESSION: Normal renal ultrasound.   Electronically Signed   By: [name], M.D.   On: 08/17/2015 08:09   US Fetal Nuchal Translucency  Measurement  08/30/2015   NUCHAL TRANSLUCENCY FOR INTEGRATED TESTING   [name] is in the office for nuchal translucency sonogram  as part of an integrated screen.  She is a 34 y.o. year old G62P4004 with Estimated Date of Delivery: 02/29/16  by early ultrasound now at  [redacted]w[redacted]d weeks gestation. Thus far the pregnancy  has been uncomplicated.  GESTATION: SINGLETON  FETAL ACTIVITY:          Heart rate         155 bpm          The fetus is active.  AMNIOTIC FLUID: The amniotic fluid volume is  normal,  PLACENTA LOCALIZATION:  posterior GRADE 0  CERVIX: Appears closed  ADNEXA:  simple rt ov cyst 4 x 3.5 x 3.4cm,normal lt ov,  GESTATIONAL AGE AND  BIOMETRICS:  Gestational criteria: Estimated Date of Delivery: 02/29/16 by early  ultrasound now at [redacted]w[redacted]d  Previous Scans:1      CROWN  RUMP LENGTH           86.3 mm         14+3wks  NUCHAL TRANSLUCENCY  CRL is out of range for NT                                                                           AVERAGE EGA(BY THIS SCAN):   14+3wks     The fetal nasal bone is identified.    TECHNICIAN COMMENTS: [place] 13+6wks,crl 8.63cm out of range for NT,measurements c/w dates,simple rt  ov cyst 4 x 3.5 x 3.4cm,normal lt ov,fht 155bpm,NB present,post pl gr 0     Amber Flora Lipps 08/30/2015 12:13 PM                                                          Microbiology: Recent Results (from the past 240 hour(s))  Urine culture     Status: None (Preliminary result)   Collection Time: 09/10/15 12:05 AM  Result Value Ref Range Status   Specimen Description URINE, CATHETERIZED  Final   Special Requests NONE  Final   Culture   Final    CULTURE REINCUBATED FOR BETTER GROWTH Performed at [facility]    Report Status PENDING  Incomplete  Wet prep, genital     Status: Abnormal   Collection Time: 09/10/15  1:42 AM  Result Value Ref Range Status   Yeast Wet Prep HPF POC NONE SEEN NONE SEEN Final   Trich, Wet Prep NONE SEEN NONE SEEN Final   Clue Cells Wet Prep HPF POC NONE  SEEN NONE SEEN Final   WBC, Wet Prep HPF POC MANY (A) NONE SEEN Final     Labs: Basic Metabolic Panel:  Recent Labs Lab 09/10/15 0201 09/11/15 0716 09/12/15 0649  NA 137 135 137  K 3.3* 3.8 3.7  CL 107 110 108  CO2 22 21* 24  GLUCOSE 101* 96 90  BUN 5* <5* <5*  CREATININE 0.57 0.47 0.44  CALCIUM 8.4* 7.5* 7.8*   Liver Function Tests: No results for input(s): AST, ALT, ALKPHOS, BILITOT, PROT, ALBUMIN in the last 168 hours. No results for input(s): LIPASE, AMYLASE in the last 168 hours. No results for input(s): AMMONIA in the last 168 hours. CBC:  Recent Labs Lab 09/10/15 0201 09/11/15 0716 09/12/15 0649  WBC 11.7* 7.5 8.5  NEUTROABS 8.2*  --   --   HGB 11.1* 10.4* 10.2*  HCT 32.9* 30.4* 31.8*  MCV 84.6 86.6 85.9  PLT 286 244 270   Cardiac Enzymes: No results for input(s): CKTOTAL, CKMB, CKMBINDEX, TROPONINI in the last 168 hours. BNP: BNP (last 3 results) No results for input(s): BNP in the last 8760 hours.  ProBNP (last 3 results) No results for input(s): PROBNP in the last 8760 hours.  CBG: No results for input(s): GLUCAP in the last 168 hours.     SignedGwenyth Bender  [facility] 09/12/2015, 10:29 AM

## 2015-09-20 ENCOUNTER — Encounter: Payer: Medicaid Other | Admitting: Advanced Practice Midwife

## 2015-10-04 ENCOUNTER — Ambulatory Visit (INDEPENDENT_AMBULATORY_CARE_PROVIDER_SITE_OTHER): Payer: Medicaid Other | Admitting: Advanced Practice Midwife

## 2015-10-04 ENCOUNTER — Encounter: Payer: Self-pay | Admitting: Advanced Practice Midwife

## 2015-10-04 VITALS — BP 100/60 | HR 80 | Wt 161.4 lb

## 2015-10-04 DIAGNOSIS — Z23 Encounter for immunization: Secondary | ICD-10-CM

## 2015-10-04 DIAGNOSIS — Z331 Pregnant state, incidental: Secondary | ICD-10-CM

## 2015-10-04 DIAGNOSIS — Z3492 Encounter for supervision of normal pregnancy, unspecified, second trimester: Secondary | ICD-10-CM

## 2015-10-04 DIAGNOSIS — Z363 Encounter for antenatal screening for malformations: Secondary | ICD-10-CM

## 2015-10-04 DIAGNOSIS — Z3682 Encounter for antenatal screening for nuchal translucency: Secondary | ICD-10-CM

## 2015-10-04 DIAGNOSIS — Z1389 Encounter for screening for other disorder: Secondary | ICD-10-CM

## 2015-10-04 LAB — POCT URINALYSIS DIPSTICK
Glucose, UA: NEGATIVE
KETONES UA: NEGATIVE
NITRITE UA: NEGATIVE
RBC UA: NEGATIVE

## 2015-10-04 NOTE — Progress Notes (Signed)
[id number] 2126w6d Estimated Date of Delivery: 02/29/16  Blood pressure 100/60, pulse 80, weight 161 lb 6.4 oz (73.211 kg), last menstrual period 06/11/2015, not currently breastfeeding.   BP weight and urine results all reviewed and noted.  Please refer to the obstetrical flow sheet for the fundal height and fetal heart rate documentation:  Patient reports good fetal movement, denies any bleeding and no rupture of membranes symptoms or regular contractions. Patient is without complaints. Feels good! All questions were answered.  Orders Placed This Encounter  Procedures  . US OB Comp + 14 Wk  . AFP, Quad Screen  . POCT urinalysis dipstick    Plan:  Continued routine obstetrical care, AFP today  Return in about 1 week (around 10/11/2015) for YQ:MVHQIONS:Anatomy and 4 weeks for LROB.

## 2015-10-06 LAB — AFP, QUAD SCREEN
DIA MOM VALUE: 0.55
DIA VALUE (EIA): 94.83 pg/mL
DSR (By Age)    1 IN: 239
DSR (Second Trimester) 1 IN: 10000
GESTATIONAL AGE AFP: 18.9 wk
MATERNAL AGE AT EDD: 36 a
MSAFP Mom: 0.8
MSAFP: 35.8 ng/mL
MSHCG MOM: 2.07
MSHCG: 49152 m[IU]/mL
Osb Risk: 10000
T18 (By Age): 1:930 {titer}
TEST RESULTS AFP: NEGATIVE
UE3 VALUE: 2.21 ng/mL
Weight: 164 [lb_av]
uE3 Mom: 1.38

## 2015-10-11 ENCOUNTER — Ambulatory Visit (INDEPENDENT_AMBULATORY_CARE_PROVIDER_SITE_OTHER): Payer: Medicaid Other

## 2015-10-11 DIAGNOSIS — Z363 Encounter for antenatal screening for malformations: Secondary | ICD-10-CM

## 2015-10-11 DIAGNOSIS — Z36 Encounter for antenatal screening of mother: Secondary | ICD-10-CM | POA: Diagnosis not present

## 2015-10-11 NOTE — Progress Notes (Addendum)
US  19+6wks,measurements c/w dates,EFW 319g,fundal pl gr 0,simple corpus luteal cyst rt ov  3.6 x 3.2 x 3.7cm,normal lt ov ,svp of fluid 5.7cm,fhr 155 bpm,breech,limited view of spine,please have pt come back for additional images of the spine Drenda Freeze(Fran was notified about f/u),no obvious abn seen

## 2015-10-16 ENCOUNTER — Encounter: Payer: Self-pay | Admitting: Women's Health

## 2015-10-16 ENCOUNTER — Encounter (HOSPITAL_COMMUNITY): Payer: Self-pay | Admitting: *Deleted

## 2015-10-16 ENCOUNTER — Ambulatory Visit (INDEPENDENT_AMBULATORY_CARE_PROVIDER_SITE_OTHER): Payer: Medicaid Other | Admitting: Women's Health

## 2015-10-16 ENCOUNTER — Observation Stay (HOSPITAL_COMMUNITY)
Admission: EM | Admit: 2015-10-16 | Discharge: 2015-10-17 | Disposition: A | Discharge status: Home / Self Care | Payer: Medicaid Other | Attending: Obstetrics and Gynecology | Admitting: Obstetrics and Gynecology

## 2015-10-16 VITALS — BP 132/80 | HR 100 | Wt 166.0 lb

## 2015-10-16 DIAGNOSIS — R319 Hematuria, unspecified: Secondary | ICD-10-CM

## 2015-10-16 DIAGNOSIS — Z3A21 21 weeks gestation of pregnancy: Secondary | ICD-10-CM | POA: Insufficient documentation

## 2015-10-16 DIAGNOSIS — E876 Hypokalemia: Secondary | ICD-10-CM | POA: Diagnosis not present

## 2015-10-16 DIAGNOSIS — N390 Urinary tract infection, site not specified: Secondary | ICD-10-CM

## 2015-10-16 DIAGNOSIS — R197 Diarrhea, unspecified: Secondary | ICD-10-CM | POA: Insufficient documentation

## 2015-10-16 DIAGNOSIS — Z1389 Encounter for screening for other disorder: Secondary | ICD-10-CM

## 2015-10-16 DIAGNOSIS — Z331 Pregnant state, incidental: Secondary | ICD-10-CM

## 2015-10-16 DIAGNOSIS — O2342 Unspecified infection of urinary tract in pregnancy, second trimester: Principal | ICD-10-CM | POA: Insufficient documentation

## 2015-10-16 DIAGNOSIS — O212 Late vomiting of pregnancy: Secondary | ICD-10-CM | POA: Insufficient documentation

## 2015-10-16 DIAGNOSIS — Z3492 Encounter for supervision of normal pregnancy, unspecified, second trimester: Secondary | ICD-10-CM

## 2015-10-16 DIAGNOSIS — O99282 Endocrine, nutritional and metabolic diseases complicating pregnancy, second trimester: Secondary | ICD-10-CM | POA: Diagnosis not present

## 2015-10-16 DIAGNOSIS — R059 Cough, unspecified: Secondary | ICD-10-CM

## 2015-10-16 DIAGNOSIS — Z87442 Personal history of urinary calculi: Secondary | ICD-10-CM | POA: Diagnosis not present

## 2015-10-16 DIAGNOSIS — R05 Cough: Secondary | ICD-10-CM | POA: Insufficient documentation

## 2015-10-16 DIAGNOSIS — E860 Dehydration: Secondary | ICD-10-CM

## 2015-10-16 DIAGNOSIS — R0602 Shortness of breath: Secondary | ICD-10-CM | POA: Diagnosis not present

## 2015-10-16 DIAGNOSIS — O219 Vomiting of pregnancy, unspecified: Secondary | ICD-10-CM

## 2015-10-16 DIAGNOSIS — R1110 Vomiting, unspecified: Secondary | ICD-10-CM

## 2015-10-16 DIAGNOSIS — O9989 Other specified diseases and conditions complicating pregnancy, childbirth and the puerperium: Secondary | ICD-10-CM | POA: Diagnosis present

## 2015-10-16 DIAGNOSIS — N949 Unspecified condition associated with female genital organs and menstrual cycle: Secondary | ICD-10-CM

## 2015-10-16 LAB — BASIC METABOLIC PANEL
ANION GAP: 8 (ref 5–15)
BUN: 5 mg/dL — ABNORMAL LOW (ref 6–20)
CALCIUM: 9.9 mg/dL (ref 8.9–10.3)
CO2: 22 mmol/L (ref 22–32)
Chloride: 104 mmol/L (ref 101–111)
Creatinine, Ser: 0.56 mg/dL (ref 0.44–1.00)
GFR calc Af Amer: >60 mL/min (ref >60)
GFR calc non Af Amer: >60 mL/min (ref >60)
GLUCOSE: 93 mg/dL (ref 65–99)
Potassium: 3.1 mmol/L — ABNORMAL LOW (ref 3.5–5.1)
Sodium: 134 mmol/L — ABNORMAL LOW (ref 135–145)

## 2015-10-16 LAB — POCT URINALYSIS DIPSTICK
Blood, UA: 3
GLUCOSE UA: NEGATIVE
KETONES UA: NEGATIVE
LEUKOCYTES UA: NEGATIVE
Nitrite, UA: NEGATIVE

## 2015-10-16 LAB — CBC WITH DIFFERENTIAL/PLATELET
BASOS PCT: 0 %
Basophils Absolute: 0 10*3/uL (ref 0.0–0.1)
Eosinophils Absolute: 0.1 10*3/uL (ref 0.0–0.7)
Eosinophils Relative: 1 %
HEMATOCRIT: 31.2 % — AB (ref 36.0–46.0)
Hemoglobin: 10.3 g/dL — ABNORMAL LOW (ref 12.0–15.0)
LYMPHS PCT: 21 %
Lymphs Abs: 2.1 10*3/uL (ref 0.7–4.0)
MCH: 27.8 pg (ref 26.0–34.0)
MCHC: 33 g/dL (ref 30.0–36.0)
MCV: 84.3 fL (ref 78.0–100.0)
MONO ABS: 0.8 10*3/uL (ref 0.1–1.0)
MONOS PCT: 8 %
NEUTROS ABS: 7.1 10*3/uL (ref 1.7–7.7)
Neutrophils Relative %: 70 %
Platelets: 266 10*3/uL (ref 150–400)
RBC: 3.7 MIL/uL — ABNORMAL LOW (ref 3.87–5.11)
RDW: 13.7 % (ref 11.5–15.5)
WBC: 10.1 10*3/uL (ref 4.0–10.5)

## 2015-10-16 LAB — URINALYSIS, ROUTINE W REFLEX MICROSCOPIC
BILIRUBIN URINE: NEGATIVE
Glucose, UA: NEGATIVE mg/dL
Ketones, ur: NEGATIVE mg/dL
Nitrite: NEGATIVE
PH: 7.5 (ref 5.0–8.0)
PROTEIN: NEGATIVE mg/dL
SPECIFIC GRAVITY, URINE: 1.015 (ref 1.005–1.030)
Urobilinogen, UA: 0.2 mg/dL (ref 0.0–1.0)

## 2015-10-16 LAB — URINE MICROSCOPIC-ADD ON

## 2015-10-16 MED ORDER — DEXTROSE 5 % IV SOLN
1.0000 g | Freq: Once | INTRAVENOUS | Status: AC
  Administered 2015-10-16: 1 g via INTRAVENOUS
  Filled 2015-10-16: qty 10

## 2015-10-16 MED ORDER — ONDANSETRON HCL 4 MG/2ML IJ SOLN
4.0000 mg | Freq: Once | INTRAMUSCULAR | Status: AC
  Administered 2015-10-16: 4 mg via INTRAVENOUS
  Filled 2015-10-16: qty 2

## 2015-10-16 MED ORDER — POTASSIUM CHLORIDE CRYS ER 20 MEQ PO TBCR
40.0000 meq | EXTENDED_RELEASE_TABLET | Freq: Once | ORAL | Status: DC
  Filled 2015-10-16: qty 2

## 2015-10-16 MED ORDER — MORPHINE SULFATE (PF) 2 MG/ML IV SOLN
2.0000 mg | Freq: Once | INTRAVENOUS | Status: AC
  Administered 2015-10-16: 2 mg via INTRAVENOUS
  Filled 2015-10-16: qty 1

## 2015-10-16 MED ORDER — SODIUM CHLORIDE 0.9 % IV BOLUS (SEPSIS)
1000.0000 mL | Freq: Once | INTRAVENOUS | Status: AC
  Administered 2015-10-16: 1000 mL via INTRAVENOUS

## 2015-10-16 MED ORDER — ALBUTEROL SULFATE (2.5 MG/3ML) 0.083% IN NEBU
5.0000 mg | INHALATION_SOLUTION | Freq: Once | RESPIRATORY_TRACT | Status: AC
  Administered 2015-10-16: 5 mg via RESPIRATORY_TRACT
  Filled 2015-10-16: qty 6

## 2015-10-16 NOTE — H&P (Signed)
This 34 yr old female, G5P4004 at 3038w4d is placed in overnight observation for nausea and vomiting of pregnancy , mild dehydration, after being [facility], and found to have evidence of a uti, but without CVAT, fever or leukocytosis, and continued to have N&V during her ED visit even after antiemetics and hydration were started.  It is felt she will require more rehydration than can be reliably obtianed in the ED,and she is placed in obs overnight for rehydration and initiation of treatment for her uti.  See Dr wickline's notes for details of ED assessment.  .mec Melissa BurrowFERGUSON,Florina Glas V, MD

## 2015-10-16 NOTE — ED Provider Notes (Signed)
CSN: [id number]     Arrival date & time 10/16/15  2145 History   By signing my name below, I, Lyndel Safe, attest that this documentation has been prepared under the direction and in the presence of Zadie Rhine, MD. Electronically Signed: Lyndel Safe, ED Scribe. 10/16/2015. 10:24 PM.  Chief Complaint  Patient presents with  . Abdominal Pain   Patient is a 34 y.o. female presenting with abdominal pain. The history is provided by the patient. No language interpreter was used.  Abdominal Pain Pain location:  RLQ Pain radiates to:  R flank Pain severity:  Moderate Onset quality:  Gradual Duration:  4 days Timing:  Constant Progression:  Worsening Relieved by:  Nothing Ineffective treatments:  Acetaminophen Associated symptoms: cough, diarrhea, hematuria, nausea, shortness of breath and vomiting   Associated symptoms: no fever, no vaginal bleeding and no vaginal discharge   Risk factors: pregnancy    HPI Comments: [name] is a 35 y.o. female, with a PMhx of renal calculi and pyelonephritis, who is [redacted] weeks gestation, presents to the Emergency Department complaining of gradually worsening, constant right-sided flank pain that radiates to right, lower abdomen onset 4 days ago. Pt was admitted approximately 1 month ago for pyelonephritis after presenting to the ED for right flank pain with nausea and vomiting and she describes her current pain to be similar. Pt associates the development of diarrhea, hematuria, and vomiting since onset of her flank pain. She additionally c/o a cough, and SOB and epigastric pain that she attributes to vomiting. Pt has been taking tylenol without relief. She does not describe her current pain to be similar to past pain with kidney stones. Denies vaginal bleeding or vaginal discharge. OB GYN at [facility].    Past Medical History  Diagnosis Date  . Ovarian cyst   . Kidney stones   . Pregnant 08/09/2015   Past Surgical History   Procedure Laterality Date  . Kidney stents    . Lithotripsy    . Appendectomy     Family History  Problem Relation Age of Onset  . Cancer Mother     breast  . Diabetes Maternal Grandmother    Social History  Substance Use Topics  . Smoking status: Never Smoker   . Smokeless tobacco: Never Used  . Alcohol Use: No   OB History    Gravida Para Term Preterm AB TAB SAB Ectopic Multiple Living   Review of Systems  Constitutional: Negative for fever.  Respiratory: Positive for cough and shortness of breath.   Gastrointestinal: Positive for nausea, vomiting, abdominal pain ( RLQ) and diarrhea.  Genitourinary: Positive for hematuria and flank pain ( right). Negative for vaginal bleeding and vaginal discharge.  All other systems reviewed and are negative.  Allergies  Review of patient's allergies indicates no known allergies.  Home Medications   Prior to Admission medications   Medication Sig Start Date End Date Taking? Authorizing Provider  acetaminophen (TYLENOL) 500 MG tablet Take 500 mg by mouth every 6 (six) hours as needed for mild pain or moderate pain.    Historical Provider, MD  calcium carbonate (TUMS - DOSED IN MG ELEMENTAL CALCIUM) 500 MG chewable tablet Chew 1 tablet by mouth daily as needed for indigestion or heartburn.    Historical Provider, MD  prenatal vitamin w/FE, FA (PRENATAL 1 + 1) 27-1 MG TABS tablet Take 1 tablet by mouth daily at 12 noon.  09/12/15   Gwenyth Bender, NP   BP 145/93 mmHg  Pulse 107  Temp(Src) 98.8 F (37.1 C) (Oral)  Resp 24  Ht  (1.6 m)  Wt 165 lb (74.844 kg)  BMI 29.24 kg/m2  SpO2 100%  LMP 06/11/2015 (Approximate) Physical Exam CONSTITUTIONAL: Well developed/well nourished HEAD: Normocephalic/atraumatic EYES: EOMI/PERRL ENMT: Mucous membranes moist NECK: supple no meningeal signs SPINE/BACK:entire spine nontender CV: S1/S2 noted, no murmurs/rubs/gallops noted LUNGS: coarse breath sounds bilaterally, no  apparent distress ABDOMEN: soft, mild RLQ tenderness, no rebound or guarding, bowel sounds noted throughout abdomen, gravid  GU:no cva tenderness NEURO: Pt is awake/alert/appropriate, moves all extremitiesx4.  No facial droop.   EXTREMITIES: pulses normal/equal, full ROM SKIN: warm, color normal PSYCH: no abnormalities of mood noted, alert and oriented to situation  ED Course  Procedures  11:21 PM Pt at 20 weeks with vomiting, uti and abdominal pain She denies vag bleeding and +fetal heart tones noted She also has cough with some residual wheeze, albuterol was given She is still vomiting.  Concern that she would not tolerate PO keflex at home D/w dr Emelda Fear with OB He will come see patient for probable observation admit  Medications  potassium chloride SA (K-DUR,KLOR-CON) CR tablet 40 mEq (not administered)  cefTRIAXone (ROCEPHIN) 1 g in dextrose 5 % 50 mL IVPB (1 g Intravenous New Bag/Given 10/16/15 2315)  morphine 2 MG/ML injection 2 mg (not administered)  ondansetron (ZOFRAN) injection 4 mg (4 mg Intravenous Given 10/16/15 2232)  albuterol (PROVENTIL) (2.5 MG/3ML) 0.083% nebulizer solution 5 mg (5 mg Nebulization Given 10/16/15 2235)  sodium chloride 0.9 % bolus 1,000 mL (0 mLs Intravenous Stopped 10/16/15 2315)  ondansetron (ZOFRAN) injection 4 mg (4 mg Intravenous Given 10/16/15 2316)    DIAGNOSTIC STUDIES: Oxygen Saturation is 100% on RA, normal by my interpretation.    COORDINATION OF CARE: 10:20 PM Discussed treatment plan with pt at bedside and pt agreed to plan. Will order diagnostic labs.    Labs Review Labs Reviewed  BASIC METABOLIC PANEL - Abnormal; Notable for the following:    Sodium 134 (*)    Potassium 3.1 (*)    BUN 5 (*)    All other components within normal limits  CBC WITH DIFFERENTIAL/PLATELET - Abnormal; Notable for the following:    RBC 3.70 (*)    Hemoglobin 10.3 (*)    HCT 31.2 (*)    All other components within normal limits  URINALYSIS,  ROUTINE W REFLEX MICROSCOPIC (NOT AT [facility]) - Abnormal; Notable for the following:    APPearance HAZY (*)    Hgb urine dipstick LARGE (*)    Leukocytes, UA MODERATE (*)    All other components within normal limits  URINE MICROSCOPIC-ADD ON - Abnormal; Notable for the following:    Squamous Epithelial / LPF FEW (*)    Bacteria, UA MANY (*)    All other components within normal limits  URINE CULTURE    I have personally reviewed and evaluated these lab results as part of my medical decision-making.    MDM   Final diagnoses:  Dehydration  UTI (lower urinary tract infection)  Intractable vomiting with nausea, vomiting of unspecified type  Hypokalemia  Cough    Nursing notes including past medical history and social history reviewed and considered in documentation Labs/vital reviewed myself and considered during evaluation   I personally performed the services described in this documentation, which was scribed in my presence. The recorded information has been reviewed and is accurate.  Zadie Rhineonald Garek Schuneman, MD 10/16/15 2322

## 2015-10-16 NOTE — Progress Notes (Signed)
Work-in Low-risk OB appointment [id number] Estimated Date of Delivery: 02/29/16 BP 132/80 mmHg  Pulse 100  Wt 166 lb (75.297 kg)  LMP 06/11/2015 (Approximate)  BP, weight reviewed.  Wasn't able to void prior to visit. Refer to obstetrical flow sheet for FH & FHR.  Reports good fm.  Denies regular uc's, lof, or uti s/s. Began yesterday w/ intermittent uc's, had some spotting last night-none since- no recent sex, diarrhea this morning about 4 times- states she took otc diarrhea med which helped, denies n/v/fever/chills. Today pain has become more intense and is more constant on Rt lower abdomen going into pubic area- tender to touch. S/P appendectomy. Does have h/o nephrolithiasis w/ lithotripsy and stents. Reports she is not sure if this feels like her normal kidney stone pain. Has had 3 renal us this pregnancy; 8/3, 8/25, 9/18 all normal. Was admitted to hosp 9/18-9/20 for suspected pyelonephritis r/t Rt flank pain, afebrile, wbc 11.7, urine cx returned lactobacillus-received iv rocephin and states she was sent home on antibiotics which she has completed. Also has known Rt corpeus luteum cyst: 3.6x3.2x3.7cm @ 19.6wks.  No CVAT, some tenderness to palpation Rt lower back as well as Rt lower abdomen into suprapubic area Spec exam: cx visually closed, no bleeding, normal physiological discharge, nonodorous SVE: LTC Discussed all w/ LHE- no further imaging needed, likely round ligament pain Reviewed warning s/s to report. Per [facility] can get pregnancy belt, use heating pad, apap prn.  Plan:  Continue routine obstetrical care  F/U as scheduled for OB appointment  Voided after appt, rbc3+, to push water in case of stone- call us if worsens

## 2015-10-16 NOTE — Progress Notes (Signed)
Pt states that she is having shortness of breath, spotting and diarrhea, pain on her lower right side.

## 2015-10-16 NOTE — ED Notes (Signed)
Pt c/o right sided flank that radiates around to abdomen; pt states she has been vomiting

## 2015-10-17 MED ORDER — TRAMADOL HCL 50 MG PO TABS: 50.0000 mg | ORAL_TABLET | Freq: Four times a day (QID) | ORAL | Status: DC | PRN

## 2015-10-17 MED ORDER — CEFTRIAXONE SODIUM 1 G IJ SOLR
1.0000 g | INTRAMUSCULAR | Status: AC
  Administered 2015-10-17: 1 g via INTRAVENOUS
  Filled 2015-10-17: qty 10

## 2015-10-17 MED ORDER — DEXTROSE 5 % IV SOLN
2.0000 g | INTRAVENOUS | Status: DC
  Filled 2015-10-17: qty 2

## 2015-10-17 MED ORDER — PRENATAL MULTIVITAMIN CH
1.0000 | ORAL_TABLET | Freq: Every day | ORAL | Status: DC
  Administered 2015-10-17: 1 via ORAL
  Filled 2015-10-17 (×2): qty 1

## 2015-10-17 MED ORDER — PROMETHAZINE HCL 12.5 MG PO TABS: 25.0000 mg | ORAL_TABLET | Freq: Four times a day (QID) | ORAL | Status: DC | PRN

## 2015-10-17 MED ORDER — HYDROMORPHONE HCL 1 MG/ML IJ SOLN
0.5000 mg | INTRAMUSCULAR | Status: DC | PRN
  Administered 2015-10-17 (×4): 0.5 mg via INTRAVENOUS
  Filled 2015-10-17 (×4): qty 1

## 2015-10-17 MED ORDER — METOCLOPRAMIDE HCL 5 MG/ML IJ SOLN: 10.0000 mg | Freq: Four times a day (QID) | INTRAMUSCULAR | Status: DC | PRN

## 2015-10-17 MED ORDER — PROMETHAZINE HCL 25 MG RE SUPP: 25.0000 mg | Freq: Four times a day (QID) | RECTAL | Status: DC | PRN

## 2015-10-17 MED ORDER — PROMETHAZINE HCL 25 MG PO TABS: 25.0000 mg | ORAL_TABLET | Freq: Four times a day (QID) | ORAL | Status: DC | PRN

## 2015-10-17 MED ORDER — PROMETHAZINE HCL 25 MG/ML IJ SOLN
12.5000 mg | Freq: Four times a day (QID) | INTRAMUSCULAR | Status: DC | PRN
  Administered 2015-10-17 (×3): 12.5 mg via INTRAVENOUS
  Filled 2015-10-17 (×3): qty 1

## 2015-10-17 MED ORDER — SIMETHICONE 80 MG PO CHEW
80.0000 mg | CHEWABLE_TABLET | Freq: Once | ORAL | Status: AC
  Administered 2015-10-17: 80 mg via ORAL
  Filled 2015-10-17: qty 1

## 2015-10-17 MED ORDER — KCL IN DEXTROSE-NACL 20-5-0.45 MEQ/L-%-% IV SOLN
INTRAVENOUS | Status: DC
  Administered 2015-10-17 (×2): via INTRAVENOUS

## 2015-10-17 MED ORDER — DEXTROSE 5 % IV SOLN
INTRAVENOUS | Status: AC
  Filled 2015-10-17: qty 10

## 2015-10-17 NOTE — Progress Notes (Signed)
Patient with orders to be discharge home. Discharge instructions given, patient verbalized understanding. Prescriptions given. Patient stable. Patient left in private vehicle with spouse.  

## 2015-10-17 NOTE — Care Management Note (Signed)
Case Management Note  Patient Details  Name: Melissa DaviesChastity L Whitmill MRN: [id number] Date of Birth: 07/15/1981  Subjective/Objective:                  Pt admitted with nausea and vomiting. Pt lives with family and will return home at discharge. Pt is independent with ADL's.  Action/Plan: No CM needs noted. Pt for discharge home today.  Expected Discharge Date:                  Expected Discharge Plan:  Home/Self Care  In-House Referral:  NA  Discharge planning Services  CM Consult  Post Acute Care Choice:  NA Choice offered to:  NA  DME Arranged:    DME Agency:     HH Arranged:    HH Agency:     Status of Service:  Completed, signed off  Medicare Important Message Given:    Date Medicare IM Given:    Medicare IM give by:    Date Additional Medicare IM Given:    Additional Medicare Important Message give by:     If discussed at Long Length of Stay Meetings, dates discussed:    Additional Comments:  Cheryl FlashBlackwell, Yaritzel Stange Crowder, RN 10/17/2015, 2:05 PM

## 2015-10-18 LAB — URINE CULTURE

## 2015-10-27 ENCOUNTER — Telehealth: Payer: Self-pay | Admitting: Obstetrics and Gynecology

## 2015-10-27 DIAGNOSIS — Z3492 Encounter for supervision of normal pregnancy, unspecified, second trimester: Secondary | ICD-10-CM

## 2015-10-30 ENCOUNTER — Telehealth: Payer: Self-pay | Admitting: Obstetrics and Gynecology

## 2015-10-30 NOTE — Telephone Encounter (Signed)
Pt aware of lab results and advised to make sure to take her prenatal vit. Pt verbalized understanding.

## 2015-10-30 NOTE — Telephone Encounter (Signed)
Spoke with pt today about her lab results.

## 2015-10-31 ENCOUNTER — Ambulatory Visit (INDEPENDENT_AMBULATORY_CARE_PROVIDER_SITE_OTHER): Payer: Medicaid Other | Admitting: Women's Health

## 2015-10-31 ENCOUNTER — Encounter: Payer: Self-pay | Admitting: Women's Health

## 2015-10-31 VITALS — BP 120/74 | HR 88 | Wt 170.0 lb

## 2015-10-31 DIAGNOSIS — Z1389 Encounter for screening for other disorder: Secondary | ICD-10-CM

## 2015-10-31 DIAGNOSIS — Z331 Pregnant state, incidental: Secondary | ICD-10-CM

## 2015-10-31 DIAGNOSIS — Z363 Encounter for antenatal screening for malformations: Secondary | ICD-10-CM

## 2015-10-31 DIAGNOSIS — Z3492 Encounter for supervision of normal pregnancy, unspecified, second trimester: Secondary | ICD-10-CM

## 2015-10-31 NOTE — Discharge Summary (Signed)
Obstetric Discharge Summary Reason for Admission: Pain dehydration rule out UTI pregnancy [redacted] weeks gestation not delivered Prenatal Procedures:   IV fluid hydration  Complications-Operative and Postpartum: none HEMOGLOBIN  Date Value Ref Range Status  10/16/2015 10.3* 12.0 - 15.0 g/dL Final   HCT  Date Value Ref Range Status  10/16/2015 31.2* 36.0 - 46.0 % Final   HEMATOCRIT  Date Value Ref Range Status  08/09/2015 36.1 34.0 - 46.6 % Final    Physical Exam:  General: alert and cooperative Incision: Not applicable DVT Evaluation: No evidence of DVT seen on physical exam.  Discharge Diagnoses: Dehydration resolved chronic pain and pregnancy  Discharge Information: Date: 10/31/2015 Activity: unrestricted Diet: routine Medications: PNV, Vicodin and Phenergan Condition: stable Instructions: refer to practice specific booklet and Continue hydration follow-up 1 week or office Discharge to: home Follow-up Information    [facility] On 10/31/2015.   Specialty:  Obstetrics and Gynecology   Why:  Postoperative visit Nov. 8th at [facility]   Contact information:   [address] [place] [id number] [phone]      Truett PernaFERGUSON,Ferris Tally V 10/31/2015, 8:57 PM

## 2015-10-31 NOTE — Progress Notes (Signed)
Low-risk OB appointment [id number] Estimated Date of Delivery: 02/29/16 BP 120/74 mmHg  Pulse 88  Wt 170 lb (77.111 kg)  LMP 06/11/2015 (Approximate)  BP, weight,reviewed. Unable to void, will try again before she leaves. Refer to obstetrical flow sheet for FH & FHR.  Reports good fm.  Denies regular uc's, lof, vb, or uti s/s. No complaints. Feeling better since last visit. Went to ED that same day and was admitted for IV hydration and UTI, urine cx was neg.  Reviewed ptl s/s, fm. Plan:  Continue routine obstetrical care  F/U in 4wks for OB appointment, pn2, and f/u u/s to recheck spine

## 2015-10-31 NOTE — Patient Instructions (Signed)
You will have your sugar test next visit.  Please do not eat or drink anything after midnight the night before you come, not even water.  You will be here for at least two hours.     Call the office ([phone]) or go to [facility] if:  You begin to have strong, frequent contractions  Your water breaks.  Sometimes it is a big gush of fluid, sometimes it is just a trickle that keeps getting your panties wet or running down your legs  You have vaginal bleeding.  It is normal to have a small amount of spotting if your cervix was checked.   You don't feel your baby moving like normal.  If you don't, get you something to eat and drink and lay down and focus on feeling your baby move.   If your baby is still not moving like normal, you should call the office or go to [facility].  Second Trimester of Pregnancy The second trimester is from week 13 through week 28, months 4 through 6. The second trimester is often a time when you feel your best. Your body has also adjusted to being pregnant, and you begin to feel better physically. Usually, morning sickness has lessened or quit completely, you may have more energy, and you may have an increase in appetite. The second trimester is also a time when the fetus is growing rapidly. At the end of the sixth month, the fetus is about 9 inches long and weighs about 1 pounds. You will likely begin to feel the baby move (quickening) between 18 and 20 weeks of the pregnancy. BODY CHANGES Your body goes through many changes during pregnancy. The changes vary from woman to woman.   Your weight will continue to increase. You will notice your lower abdomen bulging out.  You may begin to get stretch marks on your hips, abdomen, and breasts.  You may develop headaches that can be relieved by medicines approved by your health care provider.  You may urinate more often because the fetus is pressing on your bladder.  You may develop or continue to have  heartburn as a result of your pregnancy.  You may develop constipation because certain hormones are causing the muscles that push waste through your intestines to slow down.  You may develop hemorrhoids or swollen, bulging veins (varicose veins).  You may have back pain because of the weight gain and pregnancy hormones relaxing your joints between the bones in your pelvis and as a result of a shift in weight and the muscles that support your balance.  Your breasts will continue to grow and be tender.  Your gums may bleed and may be sensitive to brushing and flossing.  Dark spots or blotches (chloasma, mask of pregnancy) may develop on your face. This will likely fade after the baby is born.  A dark line from your belly button to the pubic area (linea nigra) may appear. This will likely fade after the baby is born.  You may have changes in your hair. These can include thickening of your hair, rapid growth, and changes in texture. Some women also have hair loss during or after pregnancy, or hair that feels dry or thin. Your hair will most likely return to normal after your baby is born. WHAT TO EXPECT AT YOUR PRENATAL VISITS During a routine prenatal visit:  You will be weighed to make sure you and the fetus are growing normally.  Your blood pressure will be taken.    Your abdomen will be measured to track your baby's growth.  The fetal heartbeat will be listened to.  Any test results from the previous visit will be discussed. Your health care provider may ask you:  How you are feeling.  If you are feeling the baby move.  If you have had any abnormal symptoms, such as leaking fluid, bleeding, severe headaches, or abdominal cramping.  If you have any questions. Other tests that may be performed during your second trimester include:  Blood tests that check for:  Low iron levels (anemia).  Gestational diabetes (between 24 and 28 weeks).  Rh antibodies.  Urine tests to check  for infections, diabetes, or protein in the urine.  An ultrasound to confirm the proper growth and development of the baby.  An amniocentesis to check for possible genetic problems.  Fetal screens for spina bifida and Down syndrome. HOME CARE INSTRUCTIONS   Avoid all smoking, herbs, alcohol, and unprescribed drugs. These chemicals affect the formation and growth of the baby.  Follow your health care provider's instructions regarding medicine use. There are medicines that are either safe or unsafe to take during pregnancy.  Exercise only as directed by your health care provider. Experiencing uterine cramps is a good sign to stop exercising.  Continue to eat regular, healthy meals.  Wear a good support bra for breast tenderness.  Do not use hot tubs, steam rooms, or saunas.  Wear your seat belt at all times when driving.  Avoid raw meat, uncooked cheese, cat litter boxes, and soil used by cats. These carry germs that can cause birth defects in the baby.  Take your prenatal vitamins.  Try taking a stool softener (if your health care provider approves) if you develop constipation. Eat more high-fiber foods, such as fresh vegetables or fruit and whole grains. Drink plenty of fluids to keep your urine clear or pale yellow.  Take warm sitz baths to soothe any pain or discomfort caused by hemorrhoids. Use hemorrhoid cream if your health care provider approves.  If you develop varicose veins, wear support hose. Elevate your feet for 15 minutes, 3-4 times a day. Limit salt in your diet.  Avoid heavy lifting, wear low heel shoes, and practice good posture.  Rest with your legs elevated if you have leg cramps or low back pain.  Visit your dentist if you have not gone yet during your pregnancy. Use a soft toothbrush to brush your teeth and be gentle when you floss.  A sexual relationship may be continued unless your health care provider directs you otherwise.  Continue to go to all your  prenatal visits as directed by your health care provider. SEEK MEDICAL CARE IF:   You have dizziness.  You have mild pelvic cramps, pelvic pressure, or nagging pain in the abdominal area.  You have persistent nausea, vomiting, or diarrhea.  You have a bad smelling vaginal discharge.  You have pain with urination. SEEK IMMEDIATE MEDICAL CARE IF:   You have a fever.  You are leaking fluid from your vagina.  You have spotting or bleeding from your vagina.  You have severe abdominal cramping or pain.  You have rapid weight gain or loss.  You have shortness of breath with chest pain.  You notice sudden or extreme swelling of your face, hands, ankles, feet, or legs.  You have not felt your baby move in over an hour.  You have severe headaches that do not go away with medicine.  You have vision changes.   Document Released: 12/03/2001 Document Revised: 12/14/2013 Document Reviewed: 02/09/2013 Exit[employer] Patient Information 2015 [employer]. This information is not intended to replace advice given to you by your health care provider. Make sure you discuss any questions you have with your health care provider.     

## 2015-11-01 ENCOUNTER — Encounter: Payer: Medicaid Other | Admitting: Women's Health

## 2015-11-20 ENCOUNTER — Encounter: Payer: Self-pay | Admitting: Women's Health

## 2015-11-20 ENCOUNTER — Ambulatory Visit (INDEPENDENT_AMBULATORY_CARE_PROVIDER_SITE_OTHER): Payer: Medicaid Other | Admitting: Obstetrics and Gynecology

## 2015-11-20 VITALS — BP 158/80 | HR 106 | Wt 174.0 lb

## 2015-11-20 DIAGNOSIS — B3731 Acute candidiasis of vulva and vagina: Secondary | ICD-10-CM | POA: Insufficient documentation

## 2015-11-20 DIAGNOSIS — R109 Unspecified abdominal pain: Secondary | ICD-10-CM

## 2015-11-20 DIAGNOSIS — Z3482 Encounter for supervision of other normal pregnancy, second trimester: Secondary | ICD-10-CM

## 2015-11-20 DIAGNOSIS — Z3A25 25 weeks gestation of pregnancy: Secondary | ICD-10-CM

## 2015-11-20 DIAGNOSIS — B373 Candidiasis of vulva and vagina: Secondary | ICD-10-CM | POA: Insufficient documentation

## 2015-11-20 DIAGNOSIS — Z1389 Encounter for screening for other disorder: Secondary | ICD-10-CM

## 2015-11-20 DIAGNOSIS — O2392 Unspecified genitourinary tract infection in pregnancy, second trimester: Secondary | ICD-10-CM

## 2015-11-20 DIAGNOSIS — Z331 Pregnant state, incidental: Secondary | ICD-10-CM

## 2015-11-20 LAB — POCT URINALYSIS DIPSTICK
Ketones, UA: NEGATIVE
NITRITE UA: NEGATIVE
PROTEIN UA: NEGATIVE

## 2015-11-20 MED ORDER — FLUCONAZOLE 150 MG PO TABS: 150.0000 mg | ORAL_TABLET | Freq: Once | ORAL | Status: DC

## 2015-11-20 MED ORDER — MICONAZOLE NITRATE 2 % VA CREA: 1.0000 | TOPICAL_CREAM | Freq: Every day | VAGINAL | Status: DC

## 2015-11-20 NOTE — Progress Notes (Signed)
Patient ID: Melissa DaviesChastity L Johnston, female   DOB: 06/01/1981, 34 y.o.   MRN: [id number] [facility] [id number] Estimated Date of Delivery: 02/29/16  Blood pressure 158/80, pulse 106, weight 174 lb (78.926 kg), last menstrual period 06/11/2015, not currently breastfeeding.   refer to the ob flow sheet for FH and FHR, also BP, Wt, Urine results:notable for 3+ blood, 3+ leukocytes, and trace glucose; otherwise negative   Patient reports too early for fetal movement, denies any bleeding and no rupture of membranes symptoms or regular contractions. Patient complaints: Patient complains of suprapubic pressure and right-sided pain. Patient states she does not want to have any more children.   Physical Exam Pelvic exam: normal external genitalia Vagina: Yellow, thickened discharge per vagina consistent with yeast Cervix: long and closed  FHR: 155 bpm fundal height not documented.   Questions were answered. Assessment:    1. Pregnancy 1983w4d, LROB   2. WORK-IN APPOINTMENT FOR SUPRAPUBIC PRESSURE AND PAIN.    3. Yeast Infection.   4. Possible UTI.    5. Consider BTL for future contraception.  Plan:    1. Continued routine obstetrical care,   2. Pt reassured that her pains may be due to normal abdominal ligament strain due to pregnancy. Advised pt to purchase support maternity support belt/cradle.  3. Rx Monistat, Diflucan.   4. Will send urine culture.   F/u as scheduled   By signing my name below, I, Ronney LionSuzanne Le, attest that this documentation has been prepared under the direction and in the presence of Tilda BurrowJohn V Jenina Moening, MD. Electronically Signed: Ronney LionSuzanne Le, ED Scribe. 11/20/2015. 1:00 PM.  I personally performed the services described in this documentation, which was SCRIBED in my presence. The recorded information has been reviewed and considered accurate. It has been edited as necessary during review. Tilda BurrowFERGUSON,Jarmar Rousseau V, MD  (scribe attestation statement)

## 2015-11-28 ENCOUNTER — Other Ambulatory Visit: Payer: Self-pay | Admitting: Women's Health

## 2015-11-28 DIAGNOSIS — Z0489 Encounter for examination and observation for other specified reasons: Secondary | ICD-10-CM

## 2015-11-28 DIAGNOSIS — IMO0002 Reserved for concepts with insufficient information to code with codable children: Secondary | ICD-10-CM

## 2015-11-29 ENCOUNTER — Other Ambulatory Visit: Payer: Medicaid Other

## 2015-11-29 ENCOUNTER — Encounter: Payer: Self-pay | Admitting: Advanced Practice Midwife

## 2015-11-29 ENCOUNTER — Ambulatory Visit (INDEPENDENT_AMBULATORY_CARE_PROVIDER_SITE_OTHER): Payer: Medicaid Other

## 2015-11-29 ENCOUNTER — Ambulatory Visit (INDEPENDENT_AMBULATORY_CARE_PROVIDER_SITE_OTHER): Payer: Medicaid Other | Admitting: Advanced Practice Midwife

## 2015-11-29 VITALS — BP 128/76 | HR 92 | Wt 176.0 lb

## 2015-11-29 DIAGNOSIS — Z0489 Encounter for examination and observation for other specified reasons: Secondary | ICD-10-CM

## 2015-11-29 DIAGNOSIS — Z3482 Encounter for supervision of other normal pregnancy, second trimester: Secondary | ICD-10-CM

## 2015-11-29 DIAGNOSIS — Z369 Encounter for antenatal screening, unspecified: Secondary | ICD-10-CM

## 2015-11-29 DIAGNOSIS — Z3A27 27 weeks gestation of pregnancy: Secondary | ICD-10-CM | POA: Diagnosis not present

## 2015-11-29 DIAGNOSIS — Z331 Pregnant state, incidental: Secondary | ICD-10-CM

## 2015-11-29 DIAGNOSIS — Z131 Encounter for screening for diabetes mellitus: Secondary | ICD-10-CM

## 2015-11-29 DIAGNOSIS — O3482 Maternal care for other abnormalities of pelvic organs, second trimester: Secondary | ICD-10-CM | POA: Diagnosis not present

## 2015-11-29 DIAGNOSIS — Z3492 Encounter for supervision of normal pregnancy, unspecified, second trimester: Secondary | ICD-10-CM

## 2015-11-29 DIAGNOSIS — Z1389 Encounter for screening for other disorder: Secondary | ICD-10-CM

## 2015-11-29 DIAGNOSIS — IMO0002 Reserved for concepts with insufficient information to code with codable children: Secondary | ICD-10-CM

## 2015-11-29 DIAGNOSIS — Z36 Encounter for antenatal screening of mother: Secondary | ICD-10-CM

## 2015-11-29 LAB — POCT URINALYSIS DIPSTICK
Blood, UA: NEGATIVE
Ketones, UA: NEGATIVE
Leukocytes, UA: NEGATIVE
Nitrite, UA: NEGATIVE
Protein, UA: NEGATIVE

## 2015-11-29 MED ORDER — ASPIRIN EC 81 MG PO TBEC: 81.0000 mg | DELAYED_RELEASE_TABLET | Freq: Every day | ORAL | Status: DC

## 2015-11-29 NOTE — Patient Instructions (Signed)
Unisom for sleep 

## 2015-11-29 NOTE — Progress Notes (Signed)
[id number] 7189w6d Estimated Date of Delivery: 02/29/16  Blood pressure 128/76, pulse 92, weight 176 lb (79.833 kg), last menstrual period 06/11/2015, not currently breastfeeding.   BP weight and urine results all reviewed and noted.  Please refer to the obstetrical flow sheet for the fundal height and fetal heart rate documentation:  Patient reports good fetal movement, denies any bleeding and no rupture of membranes symptoms or regular contractions.  Pt states she had preeclampsia with "one of my babies". Chart review of last pregnancy shows GHTN only.  However, pt thinks at some pregnancy she was on MgSO4  Patient has same c.o pressure and right sided pain as she has had for several months.  US today:  Follow up anatomy at 26+[redacted] weeks GA. Single female fetus in a cephalic presentation. FHR 149 bpm. Cervix is closed and measures 4.0 cm. Fluid is normal with 6.0 cm SVP. Fundal Gr 0 placenta. Right ovarian cyst persists at 4 x 3.3 x 3.2 cm. Left ovary appears normal. EFW today of 1037 g which is consistent with dating (56%). Spine visualized today and appears normal. Anatomy is now complete.  All questions were answered.  Orders Placed This Encounter  Procedures  . POCT urinalysis dipstick    Plan:  Continued routine obstetrical care, Unisom for sleep.  Rx ASA 81mg  (may not help at this point, but won't hurt)  Return in about 3 weeks (around 12/20/2015) for LROB.

## 2015-11-29 NOTE — Progress Notes (Signed)
Follow up anatomy at 26+[redacted] weeks GA.  Single female fetus in a cephalic presentation. FHR  149 bpm. Cervix is closed and measures 4.0 cm. Fluid is normal with 6.0 cm SVP. Fundal Gr 0 placenta. Right ovarian cyst persists at 4 x 3.3 x 3.2 cm.  Left ovary appears normal. EFW today of 1037 g which is consistent with dating (56%). Spine visualized today and appears normal.  Anatomy is now complete.

## 2015-11-30 LAB — CBC
Hematocrit: 28.3 % — ABNORMAL LOW (ref 34.0–46.6)
Hemoglobin: 9.2 g/dL — ABNORMAL LOW (ref 11.1–15.9)
MCH: 26.6 pg (ref 26.6–33.0)
MCHC: 32.5 g/dL (ref 31.5–35.7)
MCV: 82 fL (ref 79–97)
PLATELETS: 258 10*3/uL (ref 150–379)
RBC: 3.46 x10E6/uL — ABNORMAL LOW (ref 3.77–5.28)
RDW: 14.5 % (ref 12.3–15.4)
WBC: 7.8 10*3/uL (ref 3.4–10.8)

## 2015-11-30 LAB — GLUCOSE TOLERANCE, 2 HOURS W/ 1HR
GLUCOSE, 1 HOUR: 198 mg/dL — AB (ref 65–179)
GLUCOSE, 2 HOUR: 122 mg/dL (ref 65–152)
GLUCOSE, FASTING: 127 mg/dL — AB (ref 65–91)

## 2015-11-30 LAB — HIV ANTIBODY (ROUTINE TESTING W REFLEX): HIV SCREEN 4TH GENERATION: NONREACTIVE

## 2015-11-30 LAB — RPR: RPR Ser Ql: NONREACTIVE

## 2015-11-30 LAB — ANTIBODY SCREEN: Antibody Screen: NEGATIVE

## 2015-12-20 ENCOUNTER — Emergency Department (HOSPITAL_COMMUNITY)
Admission: AD | Admit: 2015-12-20 | Discharge: 2015-12-21 | Disposition: A | Discharge status: Home / Self Care | Payer: Medicaid Other | Source: Ambulatory Visit | Attending: Obstetrics & Gynecology | Admitting: Obstetrics & Gynecology

## 2015-12-20 ENCOUNTER — Encounter (HOSPITAL_COMMUNITY): Payer: Self-pay | Admitting: Emergency Medicine

## 2015-12-20 ENCOUNTER — Encounter (HOSPITAL_COMMUNITY): Payer: Self-pay | Admitting: *Deleted

## 2015-12-20 ENCOUNTER — Encounter: Payer: Self-pay | Admitting: Advanced Practice Midwife

## 2015-12-20 ENCOUNTER — Inpatient Hospital Stay (EMERGENCY_DEPARTMENT_HOSPITAL)
Admission: AD | Admit: 2015-12-20 | Discharge: 2015-12-20 | Disposition: A | Discharge status: Home / Self Care | Payer: Medicaid Other | Source: Ambulatory Visit | Attending: Obstetrics & Gynecology | Admitting: Obstetrics & Gynecology

## 2015-12-20 ENCOUNTER — Inpatient Hospital Stay (HOSPITAL_COMMUNITY): Payer: Medicaid Other

## 2015-12-20 ENCOUNTER — Ambulatory Visit (INDEPENDENT_AMBULATORY_CARE_PROVIDER_SITE_OTHER): Payer: Medicaid Other | Admitting: Advanced Practice Midwife

## 2015-12-20 VITALS — BP 170/100 | HR 90 | Wt 176.5 lb

## 2015-12-20 DIAGNOSIS — O219 Vomiting of pregnancy, unspecified: Secondary | ICD-10-CM

## 2015-12-20 DIAGNOSIS — O09523 Supervision of elderly multigravida, third trimester: Secondary | ICD-10-CM

## 2015-12-20 DIAGNOSIS — O133 Gestational [pregnancy-induced] hypertension without significant proteinuria, third trimester: Secondary | ICD-10-CM

## 2015-12-20 DIAGNOSIS — Y998 Other external cause status: Secondary | ICD-10-CM | POA: Insufficient documentation

## 2015-12-20 DIAGNOSIS — S3991XA Unspecified injury of abdomen, initial encounter: Secondary | ICD-10-CM | POA: Insufficient documentation

## 2015-12-20 DIAGNOSIS — O9A219 Injury, poisoning and certain other consequences of external causes complicating pregnancy, unspecified trimester: Secondary | ICD-10-CM

## 2015-12-20 DIAGNOSIS — O9A213 Injury, poisoning and certain other consequences of external causes complicating pregnancy, third trimester: Secondary | ICD-10-CM | POA: Insufficient documentation

## 2015-12-20 DIAGNOSIS — Z8742 Personal history of other diseases of the female genital tract: Secondary | ICD-10-CM | POA: Insufficient documentation

## 2015-12-20 DIAGNOSIS — Z331 Pregnant state, incidental: Secondary | ICD-10-CM

## 2015-12-20 DIAGNOSIS — S3993XA Unspecified injury of pelvis, initial encounter: Secondary | ICD-10-CM | POA: Insufficient documentation

## 2015-12-20 DIAGNOSIS — Z87442 Personal history of urinary calculi: Secondary | ICD-10-CM | POA: Insufficient documentation

## 2015-12-20 DIAGNOSIS — S79921A Unspecified injury of right thigh, initial encounter: Secondary | ICD-10-CM | POA: Insufficient documentation

## 2015-12-20 DIAGNOSIS — Y9389 Activity, other specified: Secondary | ICD-10-CM | POA: Insufficient documentation

## 2015-12-20 DIAGNOSIS — O139 Gestational [pregnancy-induced] hypertension without significant proteinuria, unspecified trimester: Secondary | ICD-10-CM

## 2015-12-20 DIAGNOSIS — W182XXA Fall in (into) shower or empty bathtub, initial encounter: Secondary | ICD-10-CM | POA: Insufficient documentation

## 2015-12-20 DIAGNOSIS — Y9289 Other specified places as the place of occurrence of the external cause: Secondary | ICD-10-CM | POA: Insufficient documentation

## 2015-12-20 DIAGNOSIS — N83209 Unspecified ovarian cyst, unspecified side: Secondary | ICD-10-CM

## 2015-12-20 DIAGNOSIS — O24410 Gestational diabetes mellitus in pregnancy, diet controlled: Secondary | ICD-10-CM

## 2015-12-20 DIAGNOSIS — Z3A29 29 weeks gestation of pregnancy: Secondary | ICD-10-CM | POA: Insufficient documentation

## 2015-12-20 DIAGNOSIS — R102 Pelvic and perineal pain: Secondary | ICD-10-CM

## 2015-12-20 DIAGNOSIS — R319 Hematuria, unspecified: Secondary | ICD-10-CM | POA: Insufficient documentation

## 2015-12-20 DIAGNOSIS — Z1389 Encounter for screening for other disorder: Secondary | ICD-10-CM

## 2015-12-20 DIAGNOSIS — O3483 Maternal care for other abnormalities of pelvic organs, third trimester: Secondary | ICD-10-CM

## 2015-12-20 DIAGNOSIS — Z3493 Encounter for supervision of normal pregnancy, unspecified, third trimester: Secondary | ICD-10-CM

## 2015-12-20 DIAGNOSIS — Z3A30 30 weeks gestation of pregnancy: Secondary | ICD-10-CM

## 2015-12-20 DIAGNOSIS — O26899 Other specified pregnancy related conditions, unspecified trimester: Secondary | ICD-10-CM

## 2015-12-20 DIAGNOSIS — Z3483 Encounter for supervision of other normal pregnancy, third trimester: Secondary | ICD-10-CM

## 2015-12-20 HISTORY — DX: Gestational diabetes mellitus in pregnancy, diet controlled: O24.410

## 2015-12-20 HISTORY — DX: Gestational (pregnancy-induced) hypertension without significant proteinuria, unspecified trimester: O13.9

## 2015-12-20 LAB — URINALYSIS, ROUTINE W REFLEX MICROSCOPIC
BILIRUBIN URINE: NEGATIVE
Bilirubin Urine: NEGATIVE
GLUCOSE, UA: NEGATIVE mg/dL
Glucose, UA: NEGATIVE mg/dL
HGB URINE DIPSTICK: NEGATIVE
KETONES UR: NEGATIVE mg/dL
KETONES UR: NEGATIVE mg/dL
Leukocytes, UA: NEGATIVE
NITRITE: NEGATIVE
Nitrite: NEGATIVE
PH: 7.5 (ref 5.0–8.0)
PH: 7.5 (ref 5.0–8.0)
Protein, ur: NEGATIVE mg/dL
Protein, ur: NEGATIVE mg/dL
Specific Gravity, Urine: 1.015 (ref 1.005–1.030)
Specific Gravity, Urine: <1.005 — ABNORMAL LOW (ref 1.005–1.030)

## 2015-12-20 LAB — COMPREHENSIVE METABOLIC PANEL
ALT: 16 U/L (ref 14–54)
ANION GAP: 13 (ref 5–15)
AST: 25 U/L (ref 15–41)
Albumin: 2.7 g/dL — ABNORMAL LOW (ref 3.5–5.0)
Alkaline Phosphatase: 85 U/L (ref 38–126)
BUN: 7 mg/dL (ref 6–20)
CHLORIDE: 100 mmol/L — AB (ref 101–111)
CO2: 22 mmol/L (ref 22–32)
Calcium: 8.9 mg/dL (ref 8.9–10.3)
Creatinine, Ser: 0.79 mg/dL (ref 0.44–1.00)
GFR calc non Af Amer: >60 mL/min (ref >60)
Glucose, Bld: 174 mg/dL — ABNORMAL HIGH (ref 65–99)
Potassium: 3 mmol/L — ABNORMAL LOW (ref 3.5–5.1)
SODIUM: 135 mmol/L (ref 135–145)
Total Bilirubin: 0.3 mg/dL (ref 0.3–1.2)
Total Protein: 6.8 g/dL (ref 6.5–8.1)

## 2015-12-20 LAB — POCT URINALYSIS DIPSTICK
Glucose, UA: NEGATIVE
KETONES UA: NEGATIVE
LEUKOCYTES UA: NEGATIVE
Nitrite, UA: NEGATIVE
Protein, UA: NEGATIVE

## 2015-12-20 LAB — PROTEIN / CREATININE RATIO, URINE
Creatinine, Urine: 51 mg/dL
PROTEIN CREATININE RATIO: 0.18 mg/mg{creat} — AB (ref 0.00–0.15)
TOTAL PROTEIN, URINE: 9 mg/dL

## 2015-12-20 LAB — CBC
HCT: 29.1 % — ABNORMAL LOW (ref 36.0–46.0)
Hemoglobin: 9.3 g/dL — ABNORMAL LOW (ref 12.0–15.0)
MCH: 25.4 pg — AB (ref 26.0–34.0)
MCHC: 32 g/dL (ref 30.0–36.0)
MCV: 79.5 fL (ref 78.0–100.0)
PLATELETS: 249 10*3/uL (ref 150–400)
RBC: 3.66 MIL/uL — ABNORMAL LOW (ref 3.87–5.11)
RDW: 14.5 % (ref 11.5–15.5)
WBC: 9.5 10*3/uL (ref 4.0–10.5)

## 2015-12-20 LAB — FETAL FIBRONECTIN: Fetal Fibronectin: NEGATIVE

## 2015-12-20 LAB — URINE MICROSCOPIC-ADD ON

## 2015-12-20 MED ORDER — ACETAMINOPHEN 500 MG PO TABS
1000.0000 mg | ORAL_TABLET | Freq: Once | ORAL | Status: AC
  Administered 2015-12-20: 1000 mg via ORAL
  Filled 2015-12-20: qty 2

## 2015-12-20 MED ORDER — ONDANSETRON 8 MG PO TBDP
8.0000 mg | ORAL_TABLET | Freq: Once | ORAL | Status: AC
  Administered 2015-12-20: 8 mg via ORAL
  Filled 2015-12-20: qty 1

## 2015-12-20 MED ORDER — PROMETHAZINE HCL 25 MG PO TABS
25.0000 mg | ORAL_TABLET | Freq: Four times a day (QID) | ORAL | Status: DC | PRN
Start: 2015-12-20 — End: 2016-01-09

## 2015-12-20 MED ORDER — FAMOTIDINE IN NACL 20-0.9 MG/50ML-% IV SOLN
20.0000 mg | Freq: Once | INTRAVENOUS | Status: AC
  Administered 2015-12-20: 20 mg via INTRAVENOUS
  Filled 2015-12-20: qty 50

## 2015-12-20 MED ORDER — PROMETHAZINE HCL 25 MG/ML IJ SOLN
25.0000 mg | Freq: Four times a day (QID) | INTRAMUSCULAR | Status: DC | PRN
  Administered 2015-12-20: 25 mg via INTRAVENOUS
  Filled 2015-12-20: qty 1

## 2015-12-20 MED ORDER — LACTATED RINGERS IV BOLUS (SEPSIS)
2000.0000 mL | Freq: Once | INTRAVENOUS | Status: AC
  Administered 2015-12-20: 1000 mL via INTRAVENOUS

## 2015-12-20 NOTE — MAU Provider Note (Signed)
MAU HISTORY AND PHYSICAL  Chief Complaint:  Hypertension   [name] is a 34 y.o.  [id number] with IUP at [redacted]w[redacted]d presenting for abd paina nd htn.  Since Sunday pain and discomfort low back. With dizziness and vomiting. Last night getting out of tub slipped and fell, hitting side of tub, hit crotch/thigh, did not hit abdomen. No vaginal bleeding. Having pain right groin where hit tub. Had some contractions last night.   Last pregnancy induced for ghtn. Today had severe-range BP at clinic. Denies ha but, sees occasional floater but no other vision change, no sob or ruq pain  Per pt cervix closed at prenatal exam today.  Admit earlier this pregnancy with n/v.  Denies contractions, LOF, or vaginal bleeding.  Past Medical History  Diagnosis Date  . Ovarian cyst   . Kidney stones   . Pregnant 08/09/2015  . Pregnancy induced hypertension     Past Surgical History  Procedure Laterality Date  . Kidney stents    . Lithotripsy    . Appendectomy      Family History  Problem Relation Age of Onset  . Cancer Mother     breast  . Diabetes Maternal Grandmother     Social History  Substance Use Topics  . Smoking status: Never Smoker   . Smokeless tobacco: Never Used  . Alcohol Use: No    No Known Allergies  Prescriptions prior to admission  Medication Sig Dispense Refill Last Dose  . acetaminophen (TYLENOL) 500 MG tablet Take 500 mg by mouth every 6 (six) hours as needed for mild pain or moderate pain.   12/19/2015 at Unknown time  . Cimetidine (HEARTBURN RELIEF PO) Take 1 tablet by mouth daily as needed (for heartburn).   12/20/2015 at Unknown time  . aspirin EC 81 MG tablet Take 1 tablet (81 mg total) by mouth daily. (Patient not taking: Reported on 12/20/2015) 30 tablet 6 Taking  . prenatal vitamin w/FE, FA (PRENATAL 1 + 1) 27-1 MG TABS tablet Take 1 tablet by mouth daily at 12 noon. (Patient not taking: Reported on 12/20/2015) 30 each 0 Taking    Review of Systems -  Negative except for what is mentioned in HPI.  Physical Exam  Blood pressure 120/56, pulse 96, temperature 98.7 F (37.1 C), temperature source Oral, resp. rate 16, last menstrual period 06/11/2015, not currently breastfeeding. GENERAL: Well-developed, well-nourished female in no acute distress.  LUNGS: Clear to auscultation bilaterally.  HEART: Regular rate and rhythm. ABDOMEN: Soft, nontender, nondistended, gravid.  EXTREMITIES: Nontender, no edema, 2+ distal pulses. Cervical Exam: deferred   Labs: Results for orders placed or performed during the hospital encounter of 12/20/15 (from the past 24 hour(s))  Fetal fibronectin   Collection Time: 12/20/15 10:35 AM  Result Value Ref Range   Fetal Fibronectin NEGATIVE NEGATIVE  Protein / creatinine ratio, urine   Collection Time: 12/20/15  1:35 PM  Result Value Ref Range   Creatinine, Urine 51.00 mg/dL   Total Protein, Urine 9 mg/dL   Protein Creatinine Ratio 0.18 (H) 0.00 - 0.15 mg/mg[Cre]  Urinalysis, Routine w reflex microscopic (not at [facility])   Collection Time: 12/20/15  1:35 PM  Result Value Ref Range   Color, Urine YELLOW YELLOW   APPearance CLEAR CLEAR   Specific Gravity, Urine 1.015 1.005 - 1.030   pH 7.5 5.0 - 8.0   Glucose, UA NEGATIVE NEGATIVE mg/dL   Hgb urine dipstick NEGATIVE NEGATIVE   Bilirubin Urine NEGATIVE NEGATIVE   Ketones, ur NEGATIVE  NEGATIVE mg/dL   Protein, ur NEGATIVE NEGATIVE mg/dL   Nitrite NEGATIVE NEGATIVE   Leukocytes, UA NEGATIVE NEGATIVE  CBC   Collection Time: 12/20/15  1:51 PM  Result Value Ref Range   WBC 9.5 4.0 - 10.5 K/uL   RBC 3.66 (L) 3.87 - 5.11 MIL/uL   Hemoglobin 9.3 (L) 12.0 - 15.0 g/dL   HCT 16.1 (L) 09.6 - 04.5 %   MCV 79.5 78.0 - 100.0 fL   MCH 25.4 (L) 26.0 - 34.0 pg   MCHC 32.0 30.0 - 36.0 g/dL   RDW 40.9 81.1 - 91.4 %   Platelets 249 150 - 400 K/uL  Comprehensive metabolic panel   Collection Time: 12/20/15  1:51 PM  Result Value Ref Range   Sodium 135 135 - 145 mmol/L    Potassium 3.0 (L) 3.5 - 5.1 mmol/L   Chloride 100 (L) 101 - 111 mmol/L   CO2 22 22 - 32 mmol/L   Glucose, Bld 174 (H) 65 - 99 mg/dL   BUN 7 6 - 20 mg/dL   Creatinine, Ser 7.82 0.44 - 1.00 mg/dL   Calcium 8.9 8.9 - 95.6 mg/dL   Total Protein 6.8 6.5 - 8.1 g/dL   Albumin 2.7 (L) 3.5 - 5.0 g/dL   AST 25 15 - 41 U/L   ALT 16 14 - 54 U/L   Alkaline Phosphatase 85 38 - 126 U/L   Total Bilirubin 0.3 0.3 - 1.2 mg/dL   GFR calc non Af Amer >60 >60 mL/min   GFR calc Af Amer >60 >60 mL/min   Anion gap 13 5 - 15  Results for orders placed or performed in visit on 12/20/15 (from the past 24 hour(s))  POCT Urinalysis Dipstick   Collection Time: 12/20/15 10:06 AM  Result Value Ref Range   Color, UA     Clarity, UA     Glucose, UA neg    Bilirubin, UA     Ketones, UA neg    Spec Grav, UA     Blood, UA 3+    pH, UA     Protein, UA neg    Urobilinogen, UA     Nitrite, UA neg    Leukocytes, UA Negative Negative    Imaging Studies:  US Ob Follow Up  11/29/2015  FOLLOW UP SONOGRAM [name] is in the office for a follow up sonogram for F/U spine anatomy not seen on previous ultrasound. She is a 34 y.o. year old [id number] with Estimated Date of Delivery: 02/29/16 by early ultrasound now at  [redacted]w[redacted]d weeks gestation. Thus far the pregnancy has been complicated by hx of pyelonephritis,rt ov cyst.. GESTATION: SINGLETON PRESENTATION: cephalic FETAL ACTIVITY:          Heart rate         149 bpm          The fetus is active. AMNIOTIC FLUID: The amniotic fluid volume is  normal, 6 cm. PLACENTA LOCALIZATION:  posterior/fundal GRADE 0 CERVIX: Measures 4 cm ADNEXA: Right ovarian cyst persists at 4 x 3.3 x 3.2 cm. Left ovary appears normal GESTATIONAL AGE AND  BIOMETRICS: Gestational criteria: Estimated Date of Delivery: 02/29/16 by early ultrasound now at [redacted]w[redacted]d Previous Scans:3          BIPARIETAL DIAMETER           7.02 cm         28+1 weeks HEAD CIRCUMFERENCE           26.29 cm  28+4 weeks  ABDOMINAL CIRCUMFERENCE           22.39 cm         26+6 weeks FEMUR LENGTH           5.08 cm         27+2 weeks                                                       AVERAGE EGA(BY THIS SCAN):  27+5 weeks                                                 ESTIMATED FETAL WEIGHT:       1037  grams, 56 % ANATOMICAL SURVEY                                                                            COMMENTS CEREBRAL VENTRICLES yes normal  CHOROID PLEXUS yes normal  CEREBELLUM    CISTERNA MAGNA    NUCHAL REGION yes normal  ORBITS yes normal  NASAL BONE    NOSE/LIP yes normal  FACIAL PROFILE yes normal  4 CHAMBERED HEART yes normal  OUTFLOW TRACTS yes normal  DIAPHRAGM yes normal  STOMACH yes normal  RENAL REGION yes normal  BLADDER yes normal  CORD INSERTION yes normal  3 VESSEL CORD    SPINE yes normal  ARMS/HANDS    LEGS/FEET    GENITALIA yes normal female     SUSPECTED ABNORMALITIES:  no QUALITY OF SCAN: satisfactory TECHNICIAN COMMENTS: Follow up anatomy at 26+[redacted] weeks GA. Single female fetus in a cephalic presentation. FHR 149 bpm. Cervix is closed and measures 4.0 cm. Fluid is normal with 6.0 cm SVP. Fundal Gr 0 placenta. Right ovarian cyst persists at 4 x 3.3 x 3.2 cm. Left ovary appears normal. EFW today of 1037 g which is consistent with dating (56%). Spine visualized today and appears normal. Anatomy is now complete.    A copy of this report including all images has been saved and backed up to a second source for retrieval if needed. All measures and details of the anatomical scan, placentation, fluid volume and pelvic anatomy are contained in that report. Amber Flora Lipps 11/29/2015 10:20 AM Clinical Impression and recommendations: I have reviewed the sonogram results above, combined with the patient's current clinical course, below are my impressions and any appropriate recommendations for management based on the sonographic findings. 1.  [id number] Estimated Date of Delivery: 02/29/16 by  early ultrasound, midtrimester  ultrasound and confirmed by today's sonographic dating 2.  Normal fetal sonographic findings, specifically normal detailed anatomical evaluation,      no abnormalities noted, specifically normal spine anatomy 3.  Normal general sonographic findings with stable right ovarian cyst, simple Recommend routine prenatal care based on this sonogram or as clinically indicated EURE,LUTHER H 11/29/2015 7:49 PM    Assessment/Plan: Caydance L Callari is  34  y.o. [id number] at 2658w6d presents with:  # Abdominal trauma, pregnancy - occurred > 24 hours ago. Irritability on toco, no contractions felt by patient. Some RLQ tenderness, NST reactive, bpp 8/8, no signs abruption on sono and no vaginal bleeding. Cervix closed at check in clinic today. - abruption return precautions  # Nausea/vomiting of pregnancy - hospitalized once this pregnancy. Ran out of meds at home. Urinalysis and CMP not suggestive of significant dehydration. Gave 2 L LR here and phenergan with moderate relief of symptoms - phenergan - push fluids - dehydration return precautions  # Gestational hypertension - one documented severe-range pressure at clinic today. Here BPs trended, found to be normotensive to mildly hypertensive. Hx ghtn last pregnancy, so do think it appears patient has recurrence. No symptoms of severe disease, and laboratory analysis unremarkable. - home with collection bottle for 24 hr urine collection - preeclampsia return precautions - clinic f/u next week for bp check - given mild-range BPs not starting antihypertensive at this time   Silvano Bilisoah B Rhys Anchondo 12/28/20165:31 PM

## 2015-12-20 NOTE — MAU Note (Signed)
[facility], pt is dizzy & sees some spots, denies HA.  Has R lower abd & pelvic pain due to a fall last night.  Has clear mucus discharge but denies bleeding or LOF.

## 2015-12-20 NOTE — MAU Note (Signed)
Pt back from U/S, pt states her heartburn & nausea are better but abd pain is still the same.

## 2015-12-20 NOTE — MAU Note (Signed)
Pt states she just vomited a small amount, states her nausea seems to be getting better.

## 2015-12-20 NOTE — Progress Notes (Addendum)
[id number] 1230w6d Estimated Date of Delivery: 02/29/16  Blood pressure 170/100, pulse 90, weight 176 lb 8 oz (80.06 kg), last menstrual period 06/11/2015, not currently breastfeeding.   BP weight and urine results all reviewed and noted.  Pt states she fell last night in the bathtub, hitting her groin/lower abd area.  No bleeding, + FM.  States that she has had some nausea and dizzy spells the past day or so.  States that she is contracting q 5-10 minutes  NST reactive, some square "contractions" on EFM q 4-5 minutes.  No contractions during exam/face -to-face discussion. [facility] Cx outer os multiparous, inner os closed   Please refer to the obstetrical flow sheet for the fundal height and fetal heart rate documentation:  Patient reports good fetal movement, denies any bleeding and no rupture of membranes symptoms All questions were answered.  Orders Placed This Encounter  Procedures  . Referral to Nutrition and Diabetes Services  . POCT Urinalysis Dipstick    Plan:  Continued routine obstetrical care, to MAU for eval of BP, ctx, fall Dr Debroah LoopArnold notified  Return in about 3 weeks (around 01/10/2016) for LROB.

## 2015-12-20 NOTE — MAU Note (Signed)
Pt vomited immediately after taking zofran & tylenol.

## 2015-12-20 NOTE — ED Notes (Signed)
Pt placed on fetal monitor and contacted [facility] to have monitoring initiated

## 2015-12-20 NOTE — ED Notes (Signed)
Last night pt states she was getting out of bathtub- had 1 foot in tub and 1 foot out and when she fell the edge of the tub struck her between her legs- [facility] and monitored due to her BP - Ultrasound was performed , and pt was released today around 1700- however pt states that the pain she was having in her lower abdo has been increasing, Has been feeling the baby moving

## 2015-12-20 NOTE — ED Provider Notes (Signed)
CSN: [id number]     Arrival date & time 12/20/15  2216 History  By signing my name below, I, [name], attest that this documentation has been prepared under the direction and in the presence of Gilda Crease, MD. Electronically Signed: Bethel Johnston, ED Scribe. 12/21/2015. 12:20 AM   Chief Complaint  Patient presents with  . Fall    The history is provided by the patient. No language interpreter was used.   [name] is a 34 y.o. female who is [redacted] weeks pregnant presenting to the Emergency Department with a chief complaint of worsening, constant, lower abdominal pain with onset yesterday after a fall.  She slipped getting out of a bath and fell such that she straddled the tub.The pain, that she describes as sore,  started in the RLQ and right thigh but is now bilateral. Pt states that she was discharged from [facility] around 5 PM after being referred by her OB following the fall. She states that she and her fetus were observed all day and she was told to return for increasing pain. This pain does not feel like contractions. Pt denies vaginal bleeding.   Past Medical History  Diagnosis Date  . Ovarian cyst   . Kidney stones   . Pregnant 08/09/2015  . Pregnancy induced hypertension    Past Surgical History  Procedure Laterality Date  . Kidney stents    . Lithotripsy    . Appendectomy     Family History  Problem Relation Age of Onset  . Cancer Mother     breast  . Diabetes Maternal Grandmother    Social History  Substance Use Topics  . Smoking status: Never Smoker   . Smokeless tobacco: Never Used  . Alcohol Use: No   OB History    Gravida Para Term Preterm AB TAB SAB Ectopic Multiple Living   Review of Systems  Gastrointestinal: Positive for abdominal pain.  Genitourinary: Negative for vaginal bleeding.  All other systems reviewed and are negative.  Allergies  Review of patient's allergies indicates no known  allergies.  Home Medications   Prior to Admission medications   Medication Sig Start Date End Date Taking? Authorizing Provider  acetaminophen (TYLENOL) 500 MG tablet Take 500 mg by mouth every 6 (six) hours as needed for mild pain or moderate pain.    Historical Provider, MD  aspirin EC 81 MG tablet Take 1 tablet (81 mg total) by mouth daily. Patient not taking: Reported on 12/20/2015 11/29/15   Jacklyn Shell, CNM  Cimetidine (HEARTBURN RELIEF PO) Take 1 tablet by mouth daily as needed (for heartburn).    Historical Provider, MD  prenatal vitamin w/FE, FA (PRENATAL 1 + 1) 27-1 MG TABS tablet Take 1 tablet by mouth daily at 12 noon. Patient not taking: Reported on 12/20/2015 09/12/15   Gwenyth Bender, NP  promethazine (PHENERGAN) 25 MG tablet Take 1 tablet (25 mg total) by mouth every 6 (six) hours as needed for nausea or vomiting. 12/20/15   Kathrynn Running, MD   BP 125/64 mmHg  Pulse 97  Temp(Src) 97.9 F (36.6 C) (Oral)  Resp 18  Ht  (1.6 m)  Wt 172 lb (78.019 kg)  BMI 30.48 kg/m2  SpO2 97%  LMP 06/11/2015 (Approximate) Physical Exam  Constitutional: She is oriented to person, place, and time. She appears well-developed and well-nourished. No distress.  HENT:  Head: Normocephalic and  atraumatic.  Right Ear: Hearing normal.  Left Ear: Hearing normal.  Nose: Nose normal.  Mouth/Throat: Oropharynx is clear and moist and mucous membranes are normal.  Eyes: Conjunctivae and EOM are normal. Pupils are equal, round, and reactive to light.  Neck: Normal range of motion. Neck supple.  Cardiovascular: Regular rhythm, S1 normal and S2 normal.  Exam reveals no gallop and no friction rub.   No murmur heard. Pulmonary/Chest: Effort normal and breath sounds normal. No respiratory distress. She exhibits no tenderness.  Abdominal: Soft. Normal appearance and bowel sounds are normal. There is no hepatosplenomegaly. There is tenderness. There is no rebound, no guarding, no  tenderness at McBurney's point and negative Murphy's sign. No hernia.  Mild lower abdominal tenderness  Musculoskeletal: Normal range of motion.  Neurological: She is alert and oriented to person, place, and time. She has normal strength. No cranial nerve deficit or sensory deficit. Coordination normal. GCS eye subscore is 4. GCS verbal subscore is 5. GCS motor subscore is 6.  Skin: Skin is warm, dry and intact. No rash noted. No cyanosis.  Psychiatric: She has a normal mood and affect. Her speech is normal and behavior is normal. Thought content normal.  Nursing note and vitals reviewed.   ED Course  Procedures (including critical care time) DIAGNOSTIC STUDIES: Oxygen Saturation is 97% on RA,  normal by my interpretation.    COORDINATION OF CARE: 11:05 PM Discussed treatment plan with pt at bedside and pt agreed to plan.  Labs Review Labs Reviewed  URINALYSIS, ROUTINE W REFLEX MICROSCOPIC (NOT AT [facility]) - Abnormal; Notable for the following:    Color, Urine STRAW (*)    Specific Gravity, Urine <1.005 (*)    Hgb urine dipstick LARGE (*)    Leukocytes, UA TRACE (*)    All other components within normal limits  URINE MICROSCOPIC-ADD ON - Abnormal; Notable for the following:    Squamous Epithelial / LPF 6-30 (*)    Bacteria, UA FEW (*)    All other components within normal limits  URINE CULTURE    Imaging Review No results found.   EKG Interpretation None      MDM   Final diagnoses:  None  pelvic pain hematuria  Presents to the emergency department with complaints of lower abdominal and pelvic pain. Patient had a fall yesterday, straddling the bathtub when she fell. Patient is [redacted] weeks pregnant. She was seen at [facility] earlier today. She had ultrasound which did not show any evidence of placenta previa or abruption. She was monitored for an extended period of time and was felt that the fetus was in no distress. She was discharged home after she has been monitored  for elevated blood pressure. Blood pressure improved without intervention and no indications were initiated. She will be closely monitored as an outpatient for her blood pressure.  Patient came to the ER tonight because she is having increased pain. Pain initially was on the right side of her pelvis, now is bilateral. She has also noticed some blood in her urine. She has not had any vaginal bleeding. Patient had a pelvic exam earlier today, will not repeat. Patient has been monitored on fetal heart monitor and there is no sign of distress. She has been cleared from an OB standpoint once again. Urinalysis did show small amount of blood in her urine, but reviewing her records reveals all of her past urine samples have had blood in them. Cultures have been negative, will reculture. Patient reassured, needs  to follow-up with her OB/GYN, Dr. Emelda FearFerguson in the office this week. We'll give a single dose of analgesia here in the ER, then recommend Tylenol for further pain.  I personally performed the services described in this documentation, which was scribed in my presence. The recorded information has been reviewed and is accurate.    Gilda Creasehristopher J Charley Miske, MD 12/21/15 [phone]

## 2015-12-20 NOTE — Patient Instructions (Signed)
Gestational Diabetes Mellitus  Gestational diabetes mellitus, often simply referred to as gestational diabetes, is a type of diabetes that some women develop during pregnancy. In gestational diabetes, the pancreas does not make enough insulin (a hormone), the cells are less responsive to the insulin that is made (insulin resistance), or both. Normally, insulin moves sugars from food into the tissue cells. The tissue cells use the sugars for energy. The lack of insulin or the lack of normal response to insulin causes excess sugars to build up in the blood instead of going into the tissue cells. As a result, high blood sugar (hyperglycemia) develops. The effect of high sugar (glucose) levels can cause many problems.   RISK FACTORS  You have an increased chance of developing gestational diabetes if you have a family history of diabetes and also have one or more of the following risk factors:  · A body mass index over 30 (obesity).  · A previous pregnancy with gestational diabetes.  · An older age at the time of pregnancy.  If blood glucose levels are kept in the normal range during pregnancy, women can have a healthy pregnancy. If your blood glucose levels are not well controlled, there may be risks to you, your unborn baby (fetus), your labor and delivery, or your newborn baby.   SYMPTOMS   If symptoms are experienced, they are much like symptoms you would normally expect during pregnancy. The symptoms of gestational diabetes include:   · Increased thirst (polydipsia).  · Increased urination (polyuria).  · Increased urination during the night (nocturia).  · Weight loss. This weight loss may be rapid.  · Frequent, recurring infections.  · Tiredness (fatigue).  · Weakness.  · Vision changes, such as blurred vision.  · Fruity smell to your breath.  · Abdominal pain.  DIAGNOSIS  Diabetes is diagnosed when blood glucose levels are increased. Your blood glucose level may be checked by one or more of the following blood  tests:  · A fasting blood glucose test. You will not be allowed to eat for at least 8 hours before a blood sample is taken.  · A random blood glucose test. Your blood glucose is checked at any time of the day regardless of when you ate.  · An oral glucose tolerance test (OGTT). Your blood glucose is measured after you have not eaten (fasted) for 1-3 hours and then after you drink a glucose-containing beverage. Since the hormones that cause insulin resistance are highest at about 24-28 weeks of a pregnancy, an OGTT is usually performed during that time. If you have risk factors, you may be screened for undiagnosed type 2 diabetes at your first prenatal visit.  TREATMENT   Gestational diabetes should be managed first with diet and exercise. Medicines may be added only if they are needed.  · You will need to take diabetes medicine or insulin daily to keep blood glucose levels in the desired range.  · You will need to match insulin dosing with exercise and healthy food choices.  If you have gestational diabetes, your treatment goal is to maintain the following blood glucose levels:  · Before meals (preprandial): at or below 95 mg/dL.  · After meals (postprandial):    One hour after a meal: at or below 140 mg/dL.    Two hours after a meal: at or below 120 mg/dL.  If you have pre-existing type 1 or type 2 diabetes, your treatment goal is to maintain the following blood glucose levels:  · Before   meals, at bedtime, and overnight: 60-99 mg/dL.  · After meals: peak of 100-129 mg/dL.  HOME CARE INSTRUCTIONS   · Have your hemoglobin A1c level checked twice a year.  · Perform daily blood glucose monitoring as directed by your health care provider. It is common to perform frequent blood glucose monitoring.  · Monitor urine ketones when you are ill and as directed by your health care provider.  · Take your diabetes medicine and insulin as directed by your health care provider to maintain your blood glucose level in the desired  range.  ¨ Never run out of diabetes medicine or insulin. It is needed every day.  ¨ Adjust insulin based on your intake of carbohydrates. Carbohydrates can raise blood glucose levels but need to be included in your diet. Carbohydrates provide vitamins, minerals, and fiber which are an essential part of a healthy diet. Carbohydrates are found in fruits, vegetables, whole grains, dairy products, legumes, and foods containing added sugars.  · Eat healthy foods. Alternate 3 meals with 3 snacks.  · Maintain a healthy weight gain. The usual total expected weight gain varies according to your prepregnancy body mass index (BMI).  · Carry a medical alert card or wear your medical alert jewelry.  · Carry a 15-gram carbohydrate snack with you at all times to treat low blood glucose (hypoglycemia). Some examples of 15-gram carbohydrate snacks include:  ¨ Glucose tablets, 3 or 4.  ¨ Glucose gel, 15-gram tube.  ¨ Raisins, 2 tablespoons (24 g).  ¨ Jelly beans, 6.  ¨ Animal crackers, 8.  ¨ Fruit juice, regular soda, or low-fat milk, 4 ounces (120 mL).  ¨ Gummy treats, 9.  · Recognize hypoglycemia. Hypoglycemia during pregnancy occurs with blood glucose levels of 60 mg/dL and below. The risk for hypoglycemia increases when fasting or skipping meals, during or after intense exercise, and during sleep. Hypoglycemia symptoms can include:  ¨ Tremors or shakes.  ¨ Decreased ability to concentrate.  ¨ Sweating.  ¨ Increased heart rate.  ¨ Headache.  ¨ Dry mouth.  ¨ Hunger.  ¨ Irritability.  ¨ Anxiety.  ¨ Restless sleep.  ¨ Altered speech or coordination.  ¨ Confusion.  · Treat hypoglycemia promptly. If you are alert and able to safely swallow, follow the 15:15 rule:  ¨ Take 15-20 grams of rapid-acting glucose or carbohydrate. Rapid-acting options include glucose gel, glucose tablets, or 4 ounces (120 mL) of fruit juice, regular soda, or low-fat milk.  ¨ Check your blood glucose level 15 minutes after taking the glucose.  ¨ Take 15-20  grams more of glucose if the repeat blood glucose level is still 70 mg/dL or below.  ¨ Eat a meal or snack within 1 hour once blood glucose levels return to normal.  · Be alert to polyuria (excess urination) and polydipsia (excess thirst) which are early signs of hyperglycemia. An early awareness of hyperglycemia allows for prompt treatment. Treat hyperglycemia as directed by your health care provider.  · Engage in at least 30 minutes of physical activity a day or as directed by your health care provider. Ten minutes of physical activity timed 30 minutes after each meal is encouraged to control postprandial blood glucose levels.  · Adjust your insulin dosing and food intake as needed if you start a new exercise or sport.  · Follow your sick-day plan at any time you are unable to eat or drink as usual.  · Avoid tobacco and alcohol use.  · Keep all follow-up visits as directed   by your health care provider.  · Follow the advice of your health care provider regarding your prenatal and post-delivery (postpartum) appointments, meal planning, exercise, medicines, vitamins, blood tests, other medical tests, and physical activities.  · Perform daily skin and foot care. Examine your skin and feet daily for cuts, bruises, redness, nail problems, bleeding, blisters, or sores.  · Brush your teeth and gums at least twice a day and floss at least once a day. Follow up with your dentist regularly.  · Schedule an eye exam during the first trimester of your pregnancy or as directed by your health care provider.  · Share your diabetes management plan with your workplace or school.  · Stay up-to-date with immunizations.  · Learn to manage stress.  · Obtain ongoing diabetes education and support as needed.  · Learn about and consider breastfeeding your baby.  · You should have your blood sugar level checked 6-12 weeks after delivery. This is done with an oral glucose tolerance test (OGTT).  SEEK MEDICAL CARE IF:   · You are unable to  eat food or drink fluids for more than 6 hours.  · You have nausea and vomiting for more than 6 hours.  · You have a blood glucose level of 200 mg/dL and you have ketones in your urine.  · There is a change in mental status.  · You develop vision problems.  · You have a persistent headache.  · You have upper abdominal pain or discomfort.  · You develop an additional serious illness.  · You have diarrhea for more than 6 hours.  · You have been sick or have had a fever for a couple of days and are not getting better.  SEEK IMMEDIATE MEDICAL CARE IF:   · You have difficulty breathing.  · You no longer feel the baby moving.  · You are bleeding or have discharge from your vagina.  · You start having premature contractions or labor.  MAKE SURE YOU:  · Understand these instructions.  · Will watch your condition.  · Will get help right away if you are not doing well or get worse.     This information is not intended to replace advice given to you by your health care provider. Make sure you discuss any questions you have with your health care provider.     Document Released: 03/17/2001 Document Revised: 12/30/2014 Document Reviewed: 07/07/2012  Elsevier Interactive Patient Education ©2016 [employer]

## 2015-12-21 MED ORDER — OXYCODONE-ACETAMINOPHEN 5-325 MG PO TABS
1.0000 | ORAL_TABLET | Freq: Once | ORAL | Status: AC
  Administered 2015-12-21: 1 via ORAL
  Filled 2015-12-21: qty 1

## 2015-12-21 NOTE — ED Notes (Signed)
Pt states understanding of care given and follow up instructions 

## 2015-12-21 NOTE — Discharge Instructions (Signed)
Pelvic Pain, Female °Pelvic pain is pain felt below the belly button and between your hips. It can be caused by many different things. It is important to get help right away. This is especially true for severe, sharp, or unusual pain that comes on suddenly.  °HOME CARE °· Only take medicine as told by your doctor. °· Rest as told by your doctor. °· Eat a healthy diet, such as fruits, vegetables, and lean meats. °· Drink enough fluids to keep your pee (urine) clear or pale yellow, or as told. °· Avoid sex (intercourse) if it causes pain. °· Apply warm or cold packs to your lower belly (abdomen). Use the type of pack that helps the pain. °· Avoid situations that cause you stress. °· Keep a journal to track your pain. Write down: °¨ When the pain started. °¨ Where it is located. °¨ If there are things that seem to be related to the pain, such as food or your period. °· Follow up with your doctor as told. °GET HELP RIGHT AWAY IF:  °· You have heavy bleeding from the vagina. °· You have more pelvic pain. °· You feel lightheaded or pass out (faint). °· You have chills. °· You have pain when you pee or have blood in your pee. °· You cannot stop having watery poop (diarrhea). °· You cannot stop throwing up (vomiting). °· You have a fever or lasting symptoms for more than 3 days. °· You have a fever and your symptoms suddenly get worse. °· You are being physically or sexually abused. °· Your medicine does not help your pain. °· You have fluid (discharge) coming from your vagina that is not normal. °MAKE SURE YOU: °· Understand these instructions. °· Will watch your condition. °· Will get help if you are not doing well or get worse. °  °This information is not intended to replace advice given to you by your health care provider. Make sure you discuss any questions you have with your health care provider. °  °Document Released: 05/27/2008 Document Revised: 12/30/2014 Document Reviewed: 03/30/2012 °Elsevier Interactive Patient  Education ©2016 [employer] ° °

## 2015-12-22 LAB — URINE CULTURE

## 2015-12-24 NOTE — L&D Delivery Note (Signed)
Patient is 35 y.o. [id number] [redacted]w[redacted]d admitted in active labor, hx of pre-eclampsia, A1DM, threatened PTL   Delivery Note At  a viable female was delivered via  (Presentation: cephalic; LOA).  APGAR: 6, 8; weight  pending.   Placenta status: intact.  Cord: 3 vessels with the following complications: none.    Anesthesia:  IV  Episiotomy:  none Lacerations:  none Suture Repair: n/a Est. Blood Loss (mL):  350cc  Mom to postpartum.  Baby to NICU.  Marvin Grabill 01/13/2016, 10:54 AM     Upon arrival patient was complete and pushing. She pushed with good maternal effort to deliver a healthy baby boy. Baby delivered without difficulty, was noted to have good tone and place on maternal abdomen for oral suctioning, drying and stimulation. Delayed cord clamping for 1 minute performed. Placenta delivered intact with 3V cord. Vaginal canal and perineum was inspected and no lacerations present; hemostatic. Pitocin was started and uterus massaged until bleeding slowed. Counts of sharps, instruments, and lap pads were all correct.   Wynne Dust, MD, PGY-1

## 2015-12-25 ENCOUNTER — Encounter (HOSPITAL_COMMUNITY): Payer: Self-pay | Admitting: *Deleted

## 2015-12-25 ENCOUNTER — Inpatient Hospital Stay (HOSPITAL_COMMUNITY)
Admission: AD | Admit: 2015-12-25 | Discharge: 2015-12-25 | Disposition: A | Discharge status: Home Health | Payer: Medicaid Other | Source: Ambulatory Visit | Attending: Family Medicine | Admitting: Family Medicine

## 2015-12-25 DIAGNOSIS — O9989 Other specified diseases and conditions complicating pregnancy, childbirth and the puerperium: Secondary | ICD-10-CM | POA: Insufficient documentation

## 2015-12-25 DIAGNOSIS — R110 Nausea: Secondary | ICD-10-CM | POA: Diagnosis not present

## 2015-12-25 DIAGNOSIS — Z3A30 30 weeks gestation of pregnancy: Secondary | ICD-10-CM | POA: Insufficient documentation

## 2015-12-25 DIAGNOSIS — Z3493 Encounter for supervision of normal pregnancy, unspecified, third trimester: Secondary | ICD-10-CM

## 2015-12-25 DIAGNOSIS — O2243 Hemorrhoids in pregnancy, third trimester: Secondary | ICD-10-CM | POA: Insufficient documentation

## 2015-12-25 DIAGNOSIS — O219 Vomiting of pregnancy, unspecified: Secondary | ICD-10-CM | POA: Diagnosis not present

## 2015-12-25 DIAGNOSIS — K641 Second degree hemorrhoids: Secondary | ICD-10-CM

## 2015-12-25 DIAGNOSIS — O24410 Gestational diabetes mellitus in pregnancy, diet controlled: Secondary | ICD-10-CM

## 2015-12-25 LAB — URINALYSIS, ROUTINE W REFLEX MICROSCOPIC
Bilirubin Urine: NEGATIVE
GLUCOSE, UA: NEGATIVE mg/dL
Ketones, ur: NEGATIVE mg/dL
NITRITE: NEGATIVE
PROTEIN: NEGATIVE mg/dL
Specific Gravity, Urine: 1.01 (ref 1.005–1.030)
pH: 7.5 (ref 5.0–8.0)

## 2015-12-25 LAB — URINE MICROSCOPIC-ADD ON

## 2015-12-25 MED ORDER — ONDANSETRON 8 MG PO TBDP
8.0000 mg | ORAL_TABLET | Freq: Once | ORAL | Status: AC
  Administered 2015-12-25: 8 mg via ORAL
  Filled 2015-12-25: qty 1

## 2015-12-25 NOTE — MAU Note (Signed)
Pt states she has been feeling pressure since last night, had some spotting, headache and vomitting. Denies any diarrhea.

## 2015-12-25 NOTE — MAU Provider Note (Signed)
  History   G5P4 at 30.4 wks in with c/o hemorrhoids and nausea.  CSN: [id number]  Arrival date and time: 12/25/15 [id number]   None     Chief Complaint  Patient presents with  . Abdominal Pain  . Emesis  . Headache  . Vaginal Bleeding   HPI  OB History    Gravida Para Term Preterm AB TAB SAB Ectopic Multiple Living   5 4 4       4       Past Medical History  Diagnosis Date  . Ovarian cyst   . Kidney stones   . Pregnant 08/09/2015  . Pregnancy induced hypertension     Past Surgical History  Procedure Laterality Date  . Kidney stents    . Lithotripsy    . Appendectomy      Family History  Problem Relation Age of Onset  . Cancer Mother     breast  . Diabetes Maternal Grandmother     Social History  Substance Use Topics  . Smoking status: Never Smoker   . Smokeless tobacco: Never Used  . Alcohol Use: No    Allergies: No Known Allergies  Prescriptions prior to admission  Medication Sig Dispense Refill Last Dose  . acetaminophen (TYLENOL) 500 MG tablet Take 500 mg by mouth every 6 (six) hours as needed for mild pain or moderate pain.   12/19/2015 at Unknown time  . aspirin EC 81 MG tablet Take 1 tablet (81 mg total) by mouth daily. (Patient not taking: Reported on 12/20/2015) 30 tablet 6 Taking  . Cimetidine (HEARTBURN RELIEF PO) Take 1 tablet by mouth daily as needed (for heartburn).   12/20/2015 at Unknown time  . prenatal vitamin w/FE, FA (PRENATAL 1 + 1) 27-1 MG TABS tablet Take 1 tablet by mouth daily at 12 noon. (Patient not taking: Reported on 12/20/2015) 30 each 0 Taking  . promethazine (PHENERGAN) 25 MG tablet Take 1 tablet (25 mg total) by mouth every 6 (six) hours as needed for nausea or vomiting. 60 tablet 2     Review of Systems  Constitutional: Negative.   HENT: Negative.   Cardiovascular: Negative.   Gastrointestinal: Positive for nausea and constipation.  Genitourinary: Negative.   Musculoskeletal: Negative.   Skin: Negative.   Neurological:  Negative.   Endo/Heme/Allergies: Negative.   Psychiatric/Behavioral: Negative.    Physical Exam   Blood pressure 146/86, pulse 90, temperature 98.3 F (36.8 C), temperature source Oral, resp. rate 20, last menstrual period 06/11/2015, not currently breastfeeding.  Physical Exam  Constitutional: She is oriented to person, place, and time. She appears well-developed and well-nourished.  HENT:  Head: Normocephalic.  Right Ear: External ear normal.  Eyes: Pupils are equal, round, and reactive to light.  Neck: Normal range of motion. Neck supple.  Cardiovascular: Normal rate, normal heart sounds and intact distal pulses.   Respiratory: Effort normal and breath sounds normal.  GI: Soft. Bowel sounds are normal.  Genitourinary: Vagina normal and uterus normal.  Musculoskeletal: Normal range of motion.  Neurological: She is alert and oriented to person, place, and time. She has normal reflexes.  Skin: Skin is warm and dry.  Psychiatric: She has a normal mood and affect. Her behavior is normal. Judgment and thought content normal.    MAU Course  Procedures  MDM Hemorrhoids Nausea without dehyration  Assessment and Plan  SVE cl/th/post/high. Non thrombosed hemorrhoids present. FHR pattern reassuring. Will d/c home  [name], [name] 12/25/2015, 10:28 AM

## 2015-12-25 NOTE — MAU Note (Signed)
Urine in lab 

## 2015-12-26 ENCOUNTER — Encounter: Payer: Medicaid Other | Admitting: Obstetrics and Gynecology

## 2015-12-26 IMAGING — US US RENAL
1 series · 14 of 25 positions shown · non-contrast
Comparison: Ultrasound July 27, 2015.

CLINICAL DATA: Right flank pain, gross hematuria.

EXAM:
RENAL / URINARY TRACT ULTRASOUND COMPLETE

[Series 1: us renal · 0.21mm/px · 14 of 25 slices shown]
[im 1/25]
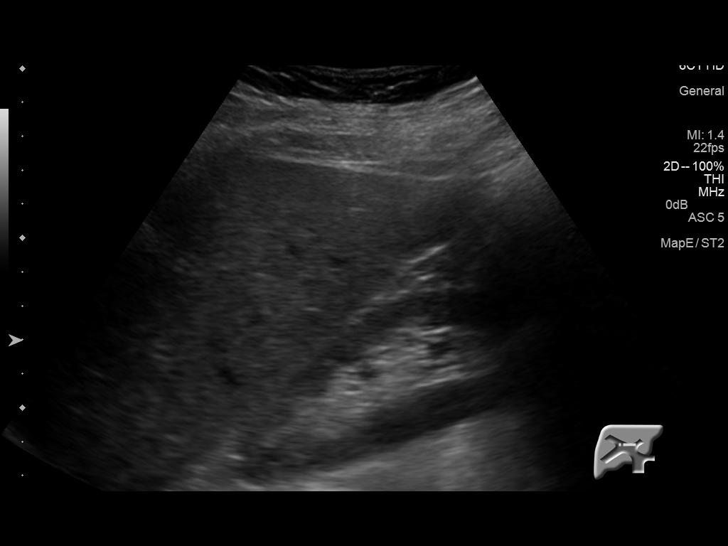
[im 3/25]
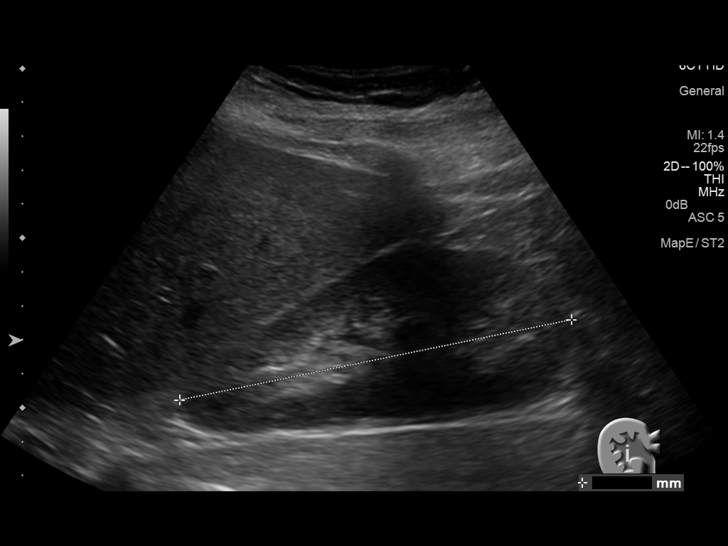
[im 5/25]
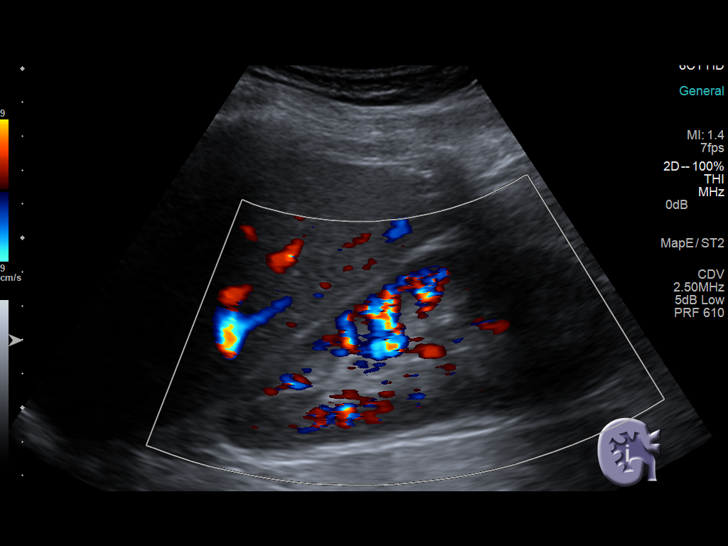
[im 7/25]
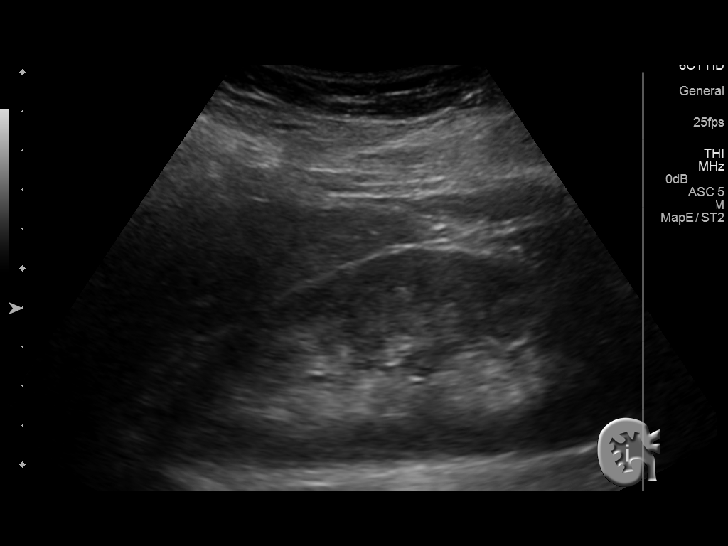
[im 9/25]
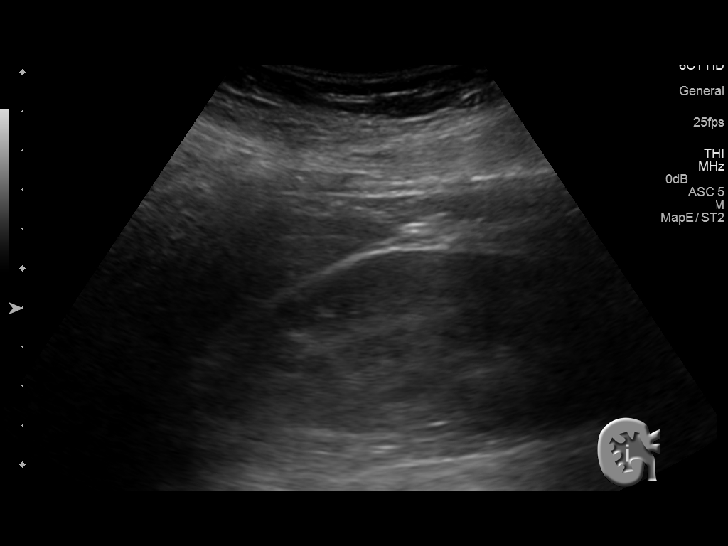
[im 10/25]
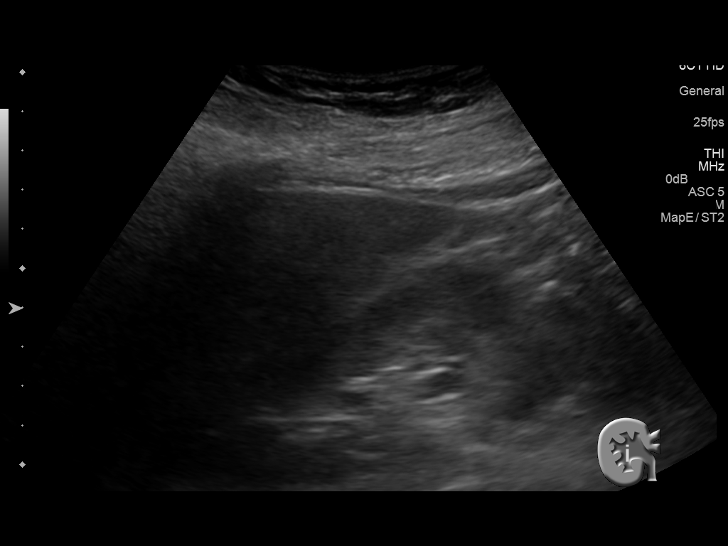
[im 12/25]
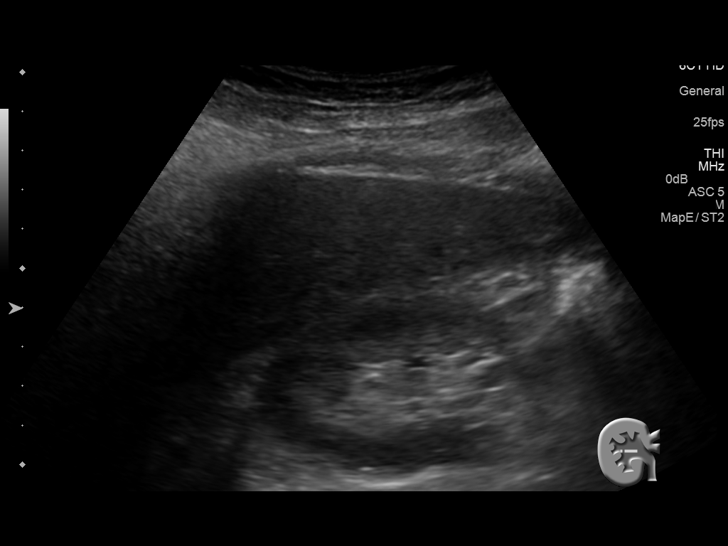
[im 14/25]
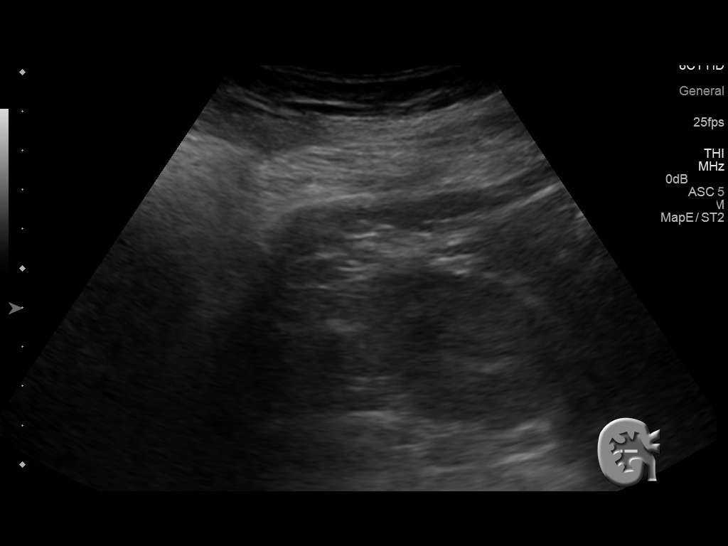
[im 16/25]
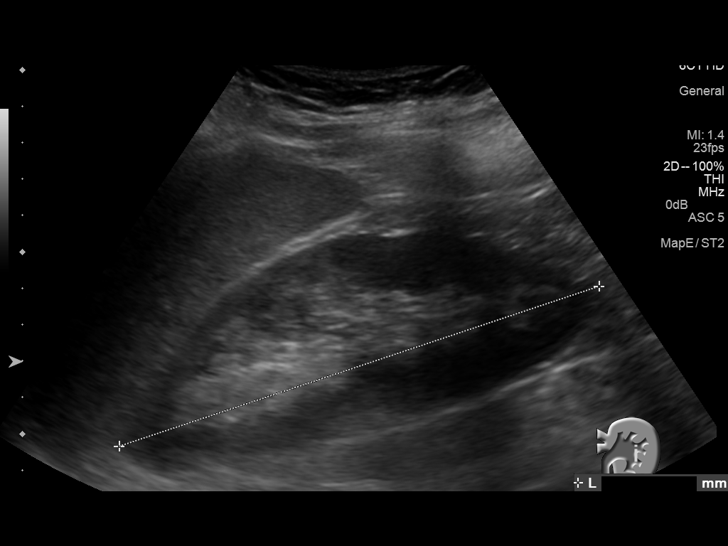
[im 17/25]
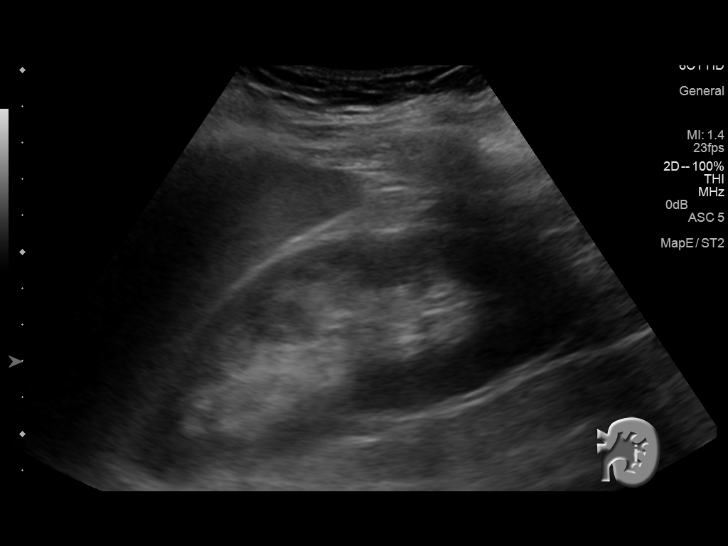
[im 19/25]
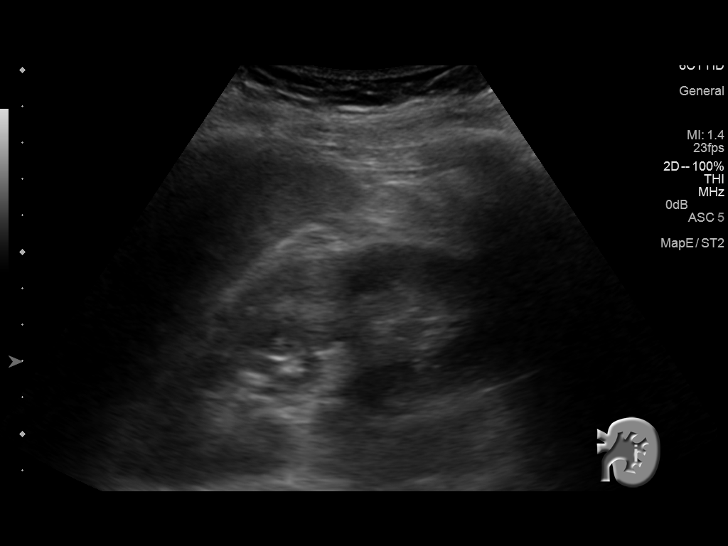
[im 21/25]
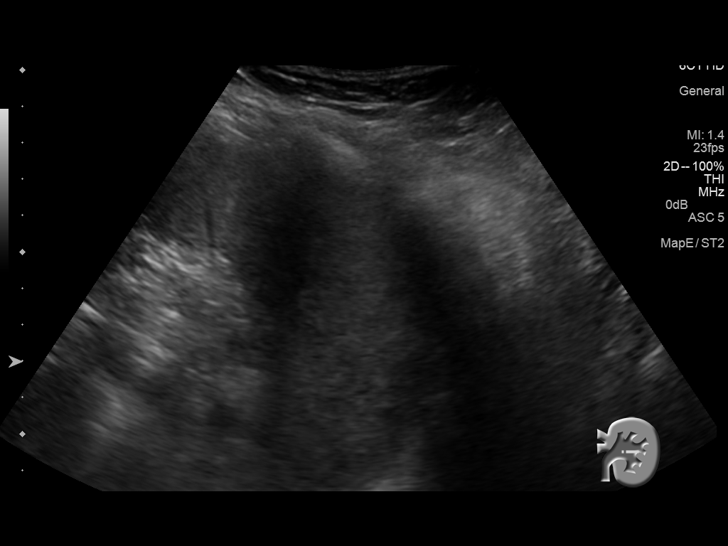
[im 23/25]
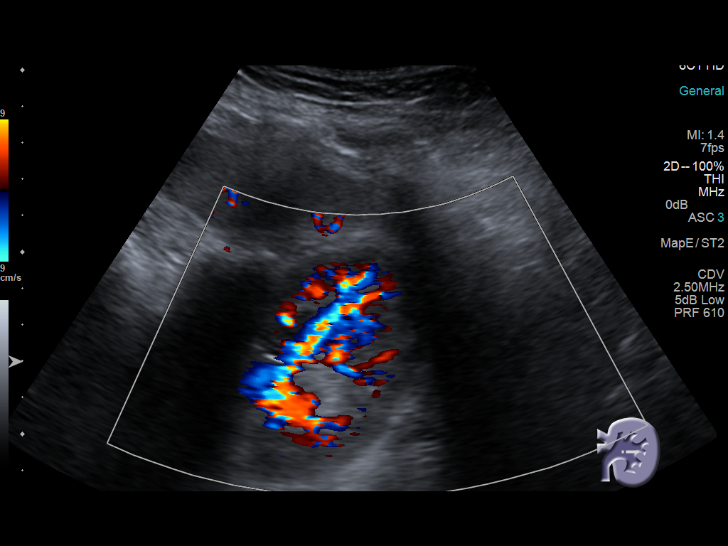
[im 25/25]
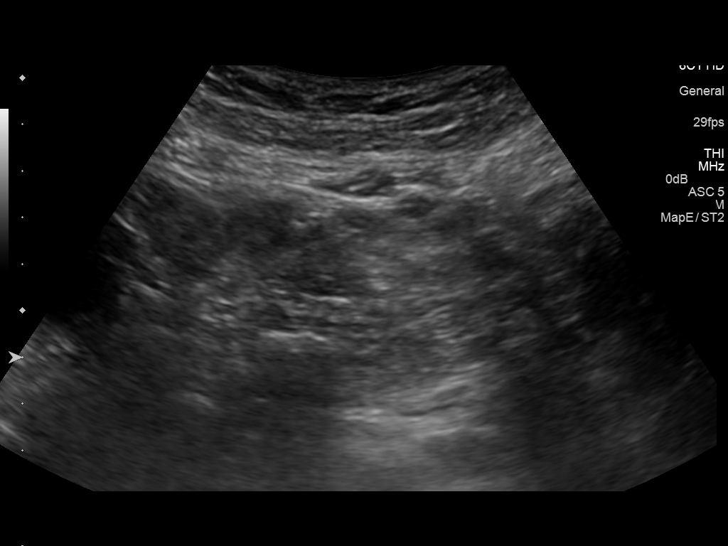

[14 of 25 positions shown; findings below may reference images not displayed]

FINDINGS: Right Kidney:

Length: 11.8 cm. Echogenicity within normal limits. No mass or
hydronephrosis visualized.

Left Kidney:

Length: 13.9 cm. Echogenicity within normal limits. No mass or
hydronephrosis visualized.

Bladder:

Not well visualized as it is completely decompressed.
IMPRESSION: Normal renal ultrasound.

## 2016-01-03 ENCOUNTER — Encounter: Payer: Medicaid Other | Attending: Advanced Practice Midwife

## 2016-01-07 ENCOUNTER — Encounter (HOSPITAL_COMMUNITY): Payer: Self-pay

## 2016-01-07 ENCOUNTER — Inpatient Hospital Stay (HOSPITAL_COMMUNITY)
Admission: AD | Admit: 2016-01-07 | Discharge: 2016-01-09 | DRG: 778 | Disposition: A | Discharge status: Home / Self Care | Payer: Medicaid Other | Source: Ambulatory Visit | Attending: Obstetrics & Gynecology | Admitting: Obstetrics & Gynecology

## 2016-01-07 DIAGNOSIS — Z3A32 32 weeks gestation of pregnancy: Secondary | ICD-10-CM | POA: Diagnosis not present

## 2016-01-07 DIAGNOSIS — O24410 Gestational diabetes mellitus in pregnancy, diet controlled: Secondary | ICD-10-CM

## 2016-01-07 DIAGNOSIS — O10913 Unspecified pre-existing hypertension complicating pregnancy, third trimester: Secondary | ICD-10-CM | POA: Diagnosis present

## 2016-01-07 DIAGNOSIS — Z833 Family history of diabetes mellitus: Secondary | ICD-10-CM

## 2016-01-07 DIAGNOSIS — O163 Unspecified maternal hypertension, third trimester: Secondary | ICD-10-CM

## 2016-01-07 DIAGNOSIS — O09213 Supervision of pregnancy with history of pre-term labor, third trimester: Secondary | ICD-10-CM

## 2016-01-07 DIAGNOSIS — O1400 Mild to moderate pre-eclampsia, unspecified trimester: Secondary | ICD-10-CM | POA: Diagnosis present

## 2016-01-07 LAB — URINALYSIS, ROUTINE W REFLEX MICROSCOPIC
Bilirubin Urine: NEGATIVE
GLUCOSE, UA: NEGATIVE mg/dL
HGB URINE DIPSTICK: NEGATIVE
KETONES UR: NEGATIVE mg/dL
LEUKOCYTES UA: NEGATIVE
Nitrite: NEGATIVE
PROTEIN: NEGATIVE mg/dL
Specific Gravity, Urine: <1.005 — ABNORMAL LOW (ref 1.005–1.030)
pH: 7 (ref 5.0–8.0)

## 2016-01-07 LAB — CBC
HCT: 31.1 % — ABNORMAL LOW (ref 36.0–46.0)
HEMOGLOBIN: 9.8 g/dL — AB (ref 12.0–15.0)
MCH: 24.7 pg — AB (ref 26.0–34.0)
MCHC: 31.5 g/dL (ref 30.0–36.0)
MCV: 78.5 fL (ref 78.0–100.0)
PLATELETS: 287 10*3/uL (ref 150–400)
RBC: 3.96 MIL/uL (ref 3.87–5.11)
RDW: 15.2 % (ref 11.5–15.5)
WBC: 10.6 10*3/uL — ABNORMAL HIGH (ref 4.0–10.5)

## 2016-01-07 LAB — COMPREHENSIVE METABOLIC PANEL
ALBUMIN: 2.9 g/dL — AB (ref 3.5–5.0)
ALT: 19 U/L (ref 14–54)
ANION GAP: 11 (ref 5–15)
AST: 30 U/L (ref 15–41)
Alkaline Phosphatase: 105 U/L (ref 38–126)
BUN: 6 mg/dL (ref 6–20)
CALCIUM: 9.8 mg/dL (ref 8.9–10.3)
CHLORIDE: 102 mmol/L (ref 101–111)
CO2: 24 mmol/L (ref 22–32)
Creatinine, Ser: 0.83 mg/dL (ref 0.44–1.00)
GFR calc non Af Amer: >60 mL/min (ref >60)
GLUCOSE: 99 mg/dL (ref 65–99)
POTASSIUM: 3 mmol/L — AB (ref 3.5–5.1)
SODIUM: 137 mmol/L (ref 135–145)
Total Bilirubin: 0.5 mg/dL (ref 0.3–1.2)
Total Protein: 7.1 g/dL (ref 6.5–8.1)

## 2016-01-07 LAB — PROTEIN / CREATININE RATIO, URINE
Creatinine, Urine: 34 mg/dL
PROTEIN CREATININE RATIO: 0.24 mg/mg{creat} — AB (ref 0.00–0.15)
TOTAL PROTEIN, URINE: 8 mg/dL

## 2016-01-07 LAB — TYPE AND SCREEN
ABO/RH(D): O POS
Antibody Screen: NEGATIVE

## 2016-01-07 LAB — FETAL FIBRONECTIN: FETAL FIBRONECTIN: POSITIVE — AB

## 2016-01-07 MED ORDER — MAGNESIUM SULFATE 50 % IJ SOLN
2.0000 g/h | INTRAVENOUS | Status: AC
  Administered 2016-01-07 – 2016-01-08 (×2): 2 g/h via INTRAVENOUS
  Filled 2016-01-07 (×2): qty 80

## 2016-01-07 MED ORDER — NIFEDIPINE 10 MG PO CAPS
10.0000 mg | ORAL_CAPSULE | Freq: Four times a day (QID) | ORAL | Status: DC
  Administered 2016-01-08 – 2016-01-09 (×4): 10 mg via ORAL
  Filled 2016-01-07 (×5): qty 1

## 2016-01-07 MED ORDER — CITRIC ACID-SODIUM CITRATE 334-500 MG/5ML PO SOLN: 30.0000 mL | Freq: Once | ORAL | Status: DC

## 2016-01-07 MED ORDER — DOCUSATE SODIUM 100 MG PO CAPS
100.0000 mg | ORAL_CAPSULE | Freq: Every day | ORAL | Status: DC
  Administered 2016-01-08 – 2016-01-09 (×2): 100 mg via ORAL
  Filled 2016-01-07 (×4): qty 1

## 2016-01-07 MED ORDER — TERBUTALINE SULFATE 1 MG/ML IJ SOLN
0.2500 mg | Freq: Once | INTRAMUSCULAR | Status: AC
  Administered 2016-01-07: 0.25 mg via SUBCUTANEOUS

## 2016-01-07 MED ORDER — FENTANYL CITRATE (PF) 100 MCG/2ML IJ SOLN
100.0000 ug | INTRAMUSCULAR | Status: DC | PRN
  Administered 2016-01-07 – 2016-01-09 (×4): 100 ug via INTRAVENOUS
  Filled 2016-01-07 (×4): qty 2

## 2016-01-07 MED ORDER — BETAMETHASONE SOD PHOS & ACET 6 (3-3) MG/ML IJ SUSP
12.0000 mg | INTRAMUSCULAR | Status: AC
Start: 2016-01-07 — End: 2016-01-08
  Administered 2016-01-07 – 2016-01-08 (×2): 12 mg via INTRAMUSCULAR
  Filled 2016-01-07 (×3): qty 2

## 2016-01-07 MED ORDER — MAGNESIUM SULFATE BOLUS VIA INFUSION
4.0000 g | Freq: Once | INTRAVENOUS | Status: AC
  Administered 2016-01-07: 4 g via INTRAVENOUS
  Filled 2016-01-07: qty 500

## 2016-01-07 MED ORDER — PROMETHAZINE HCL 25 MG/ML IJ SOLN
25.0000 mg | Freq: Once | INTRAMUSCULAR | Status: AC
  Administered 2016-01-07: 25 mg via INTRAVENOUS
  Filled 2016-01-07: qty 1

## 2016-01-07 MED ORDER — LACTATED RINGERS IV SOLN
INTRAVENOUS | Status: DC
Start: 2016-01-07 — End: 2016-01-09
  Administered 2016-01-07 – 2016-01-09 (×6): via INTRAVENOUS

## 2016-01-07 MED ORDER — ACETAMINOPHEN 325 MG PO TABS
650.0000 mg | ORAL_TABLET | ORAL | Status: DC | PRN
  Administered 2016-01-07 – 2016-01-09 (×6): 650 mg via ORAL
  Filled 2016-01-07 (×6): qty 2

## 2016-01-07 MED ORDER — ONDANSETRON HCL 4 MG/2ML IJ SOLN
4.0000 mg | Freq: Three times a day (TID) | INTRAMUSCULAR | Status: DC | PRN
  Administered 2016-01-07 – 2016-01-09 (×2): 4 mg via INTRAVENOUS
  Filled 2016-01-07 (×3): qty 2

## 2016-01-07 MED ORDER — CITRIC ACID-SODIUM CITRATE 334-500 MG/5ML PO SOLN
ORAL | Status: AC
  Administered 2016-01-07: 30 mL
  Filled 2016-01-07: qty 15

## 2016-01-07 MED ORDER — FAMOTIDINE IN NACL 20-0.9 MG/50ML-% IV SOLN
20.0000 mg | Freq: Once | INTRAVENOUS | Status: AC
Start: 2016-01-07 — End: 2016-01-07
  Administered 2016-01-07: 20 mg via INTRAVENOUS
  Filled 2016-01-07: qty 50

## 2016-01-07 MED ORDER — NIFEDIPINE 10 MG PO CAPS: 10.0000 mg | ORAL_CAPSULE | Freq: Once | ORAL | Status: DC

## 2016-01-07 MED ORDER — CALCIUM CARBONATE ANTACID 500 MG PO CHEW
2.0000 | CHEWABLE_TABLET | ORAL | Status: DC | PRN
  Administered 2016-01-07 – 2016-01-09 (×7): 400 mg via ORAL
  Filled 2016-01-07 (×10): qty 2

## 2016-01-07 MED ORDER — TERBUTALINE SULFATE 1 MG/ML IJ SOLN
INTRAMUSCULAR | Status: AC
  Filled 2016-01-07: qty 1

## 2016-01-07 MED ORDER — ZOLPIDEM TARTRATE 5 MG PO TABS: 5.0000 mg | ORAL_TABLET | Freq: Every evening | ORAL | Status: DC | PRN

## 2016-01-07 MED ORDER — PRENATAL MULTIVITAMIN CH
1.0000 | ORAL_TABLET | Freq: Every day | ORAL | Status: DC
  Administered 2016-01-08 – 2016-01-09 (×2): 1 via ORAL
  Filled 2016-01-07 (×4): qty 1

## 2016-01-07 MED ORDER — NIFEDIPINE 10 MG PO CAPS
20.0000 mg | ORAL_CAPSULE | Freq: Once | ORAL | Status: AC
  Administered 2016-01-07: 20 mg via ORAL
  Filled 2016-01-07: qty 2

## 2016-01-07 NOTE — Progress Notes (Signed)
Notified Melissa BourgeoisMarie Williams, CNM pt not responding to medication for contractions.  Plans for starting Mag.Marland Kitchen..Marland Kitchen

## 2016-01-07 NOTE — Progress Notes (Signed)
Notified of pt arrival in MAU and complaint. Will come see pt 

## 2016-01-07 NOTE — MAU Note (Signed)
Contractions off and on since Friday night. Some lower back pain as well. Vaginal burning. Unable to keep down food or flds all day Sat.

## 2016-01-07 NOTE — MAU Note (Signed)
[facility] Will call back when pt can come to labor and delivery

## 2016-01-07 NOTE — Progress Notes (Signed)
Called regarding contractions and pt discomfort. Expressed concern about pt cervical exam with contraction pattern and preterm gestation. Dr. Rhys MartiniParkers state M. Williams aware and waiting of fetal fibronectin and is coming to see pt

## 2016-01-07 NOTE — H&P (Signed)
HPI:  [name] is a 35 y.o. [id number] at 7553w3d who presents with back pain and contractions. Pain is located in her lower back, most on the right side radiating towards her abdomen and has been present for the past few days. Intermittent in nature. She has tried tylenol which has not helped. Last night, she started noticing increased contractions with vaginal burning. No fevers or chills. Has had nausea and vomiting for the past several weeks. No constipation or diarrhea. Patient has a history of preterm labor and had her last baby at 34 weeks. No dysuria. Has had headache for the past few days as well.  Prenatal History/Complications:   [facility]  Initiated Care at  10 weeks  FOB [facility]ie Fielding[facility] Torres  Dating By 9 week US  Pap 05/2013  Normal with Neg HPV  GC/CT Initial:  -/-              36+wks:  Genetic Screen NT/IT: too big to measure   AFP: neg  CF screen Neg 2014  Anatomic US Normal, limited spine, repeat at 28wks: normal  Flu vaccine 10/04/15  Tdap Recommended ~ 28wks  Glucose Screen  2 hr   127/198/122  GBS   Feed Preference bottle  Contraception BTL   Consent:   Circumcision Yes, at [facility]  Childbirth Classes declined  Pediatrician [facility]     Past Medical History: Past Medical History  Diagnosis Date  . Ovarian cyst   . Kidney stones   . Pregnant 08/09/2015  . Pregnancy induced hypertension     Past Surgical History: Past Surgical History  Procedure Laterality Date  . Kidney stents    . Lithotripsy    . Appendectomy      Obstetrical History: OB History    Gravida Para Term Preterm AB TAB SAB Ectopic Multiple Living   5 4 4       4       Gynecological History: OB History    Gravida Para Term Preterm AB TAB SAB Ectopic Multiple Living   5 4 4       4       Social History: Social History   Social History  . Marital Status: Married    Spouse Name: N/A  . Number of Children: N/A  . Years of Education: N/A   Social History Main  Topics  . Smoking status: Never Smoker   . Smokeless tobacco: Never Used  . Alcohol Use: No  . Drug Use: No  . Sexual Activity: Yes    Birth Control/ Protection: None   Other Topics Concern  . None   Social History Narrative    Family History: Family History  Problem Relation Age of Onset  . Cancer Mother     breast  . Diabetes Maternal Grandmother     Allergies: No Known Allergies  Prescriptions prior to admission  Medication Sig Dispense Refill Last Dose  . acetaminophen (TYLENOL) 500 MG tablet Take 500 mg by mouth every 6 (six) hours as needed for mild pain or moderate pain.   01/07/2016 at Unknown time  . calcium carbonate (TUMS - DOSED IN MG ELEMENTAL CALCIUM) 500 MG chewable tablet Chew 1 tablet by mouth daily.   01/06/2016 at Unknown time  . prenatal vitamin w/FE, FA (PRENATAL 1 + 1) 27-1 MG TABS tablet Take 1 tablet by mouth daily at 12 noon. 30 each 0 01/06/2016 at Unknown time  . aspirin EC 81 MG tablet Take 1 tablet (81 mg total)  by mouth daily. (Patient not taking: Reported on 12/20/2015) 30 tablet 6 Taking  . promethazine (PHENERGAN) 25 MG tablet Take 1 tablet (25 mg total) by mouth every 6 (six) hours as needed for nausea or vomiting. (Patient not taking: Reported on 01/07/2016) 60 tablet 2      Review of Systems  Gastrointestinal: Positive for nausea, vomiting and abdominal pain.  Musculoskeletal: Positive for back pain.  Neurological: Positive for headaches.  All other systems reviewed and are negative.  Physical Exam  Blood pressure 156/92, pulse 92, temperature 97.8 F (36.6 C), resp. rate 20, height 5\' 3"  (1.6 m), weight 81.738 kg (180 lb 3.2 oz), last menstrual period 06/11/2015, not currently breastfeeding. Constitutional: She is oriented to person, place, and time. She appears well-developed and well-nourished.  HENT:  Head: Normocephalic and atraumatic.  Eyes: EOM are normal. Pupils are equal, round, and reactive to light.  Neck: Normal range of  motion. Neck supple.  Cardiovascular: Normal rate, regular rhythm and normal heart sounds.  Respiratory: Effort normal and breath sounds normal. No respiratory distress.  GI: Soft. Bowel sounds are normal. She exhibits no distension. There is no tenderness. There is no rebound and no guarding.  Genitourinary: Vagina normal.  Musculoskeletal: Normal range of motion.  Neurological: She is alert and oriented to person, place, and time. No cranial nerve deficit.  Skin: Skin is warm and dry.  Psychiatric: She has a normal mood and affect. Her behavior is normal.   FHT: FHR: 150 bpm, variability: moderate, accelerations: present, decelerations: Absent UC: Every 4 minutes  Dilation: 3 Effacement (%): 50 Cervical Position: Posterior Station: Ballotable Exam by:: Dr. Jimmey Ralph    Results for orders placed or performed during the hospital encounter of 01/07/16 (from the past 24 hour(s))  Urinalysis, Routine w reflex microscopic (not at [facility])   Collection Time: 01/07/16  5:20 AM  Result Value Ref Range   Color, Urine YELLOW YELLOW   APPearance CLEAR CLEAR   Specific Gravity, Urine <1.005 (L) 1.005 - 1.030   pH 7.0 5.0 - 8.0   Glucose, UA NEGATIVE NEGATIVE mg/dL   Hgb urine dipstick NEGATIVE NEGATIVE   Bilirubin Urine NEGATIVE NEGATIVE   Ketones, ur NEGATIVE NEGATIVE mg/dL   Protein, ur NEGATIVE NEGATIVE mg/dL   Nitrite NEGATIVE NEGATIVE   Leukocytes, UA NEGATIVE NEGATIVE  Protein / creatinine ratio, urine   Collection Time: 01/07/16  5:20 AM  Result Value Ref Range   Creatinine, Urine 34.00 mg/dL   Total Protein, Urine 8 mg/dL   Protein Creatinine Ratio 0.24 (H) 0.00 - 0.15 mg/mg[Cre]  Fetal fibronectin   Collection Time: 01/07/16  6:09 AM  Result Value Ref Range   Fetal Fibronectin POSITIVE (A) NEGATIVE  CBC   Collection Time: 01/07/16  6:24 AM  Result Value Ref Range   WBC 10.6 (H) 4.0 - 10.5 K/uL   RBC 3.96 3.87 - 5.11 MIL/uL   Hemoglobin 9.8 (L) 12.0 - 15.0 g/dL   HCT  16.1 (L) 09.6 - 46.0 %   MCV 78.5 78.0 - 100.0 fL   MCH 24.7 (L) 26.0 - 34.0 pg   MCHC 31.5 30.0 - 36.0 g/dL   RDW 04.5 40.9 - 81.1 %   Platelets 287 150 - 400 K/uL  Comprehensive metabolic panel   Collection Time: 01/07/16  6:24 AM  Result Value Ref Range   Sodium 137 135 - 145 mmol/L   Potassium 3.0 (L) 3.5 - 5.1 mmol/L   Chloride 102 101 - 111 mmol/L   CO2  24 22 - 32 mmol/L   Glucose, Bld 99 65 - 99 mg/dL   BUN 6 6 - 20 mg/dL   Creatinine, Ser 4.09 0.44 - 1.00 mg/dL   Calcium 9.8 8.9 - 81.1 mg/dL   Total Protein 7.1 6.5 - 8.1 g/dL   Albumin 2.9 (L) 3.5 - 5.0 g/dL   AST 30 15 - 41 U/L   ALT 19 14 - 54 U/L   Alkaline Phosphatase 105 38 - 126 U/L   Total Bilirubin 0.5 0.3 - 1.2 mg/dL   GFR calc non Af Amer >60 >60 mL/min   GFR calc Af Amer >60 >60 mL/min   Anion gap 11 5 - 15  Type and screen [facility]   Collection Time: 01/07/16  6:24 AM  Result Value Ref Range   ABO/RH(D) O POS    Antibody Screen NEG    Sample Expiration 01/10/2016     Assessment: Maurisha L Boorman is a 35 y.o. [id number] at [redacted]w[redacted]d here with preterm labor and elevated BP.  # Preterm labor - Admit, start tocolysis with magnesium - Give first dose betamethasone today - Tocometry, fetal monitoring  # Elevated BP, rule out preeclampsia - Check CBC, CMP, UPC - Labetalol as needed for BP >160/110  Jacquiline Doe 01/07/2016, 7:38 AM  Seen and examined by me also Agree with note Patient is very uncomfortable with contractions Per MD, will try a dose of Procardia and if it does not work will start MagSO4 Procardia did not help, so MGSO4 started Betamethasone ordered Aviva Signs, CNM

## 2016-01-07 NOTE — Progress Notes (Signed)
Transfer to 173 via wheel chair with primary RN

## 2016-01-08 MED ORDER — PROMETHAZINE HCL 25 MG/ML IJ SOLN
25.0000 mg | Freq: Four times a day (QID) | INTRAMUSCULAR | Status: DC | PRN
  Administered 2016-01-08 – 2016-01-09 (×3): 25 mg via INTRAVENOUS
  Filled 2016-01-08 (×3): qty 1

## 2016-01-08 MED ORDER — NIFEDIPINE 10 MG PO CAPS
10.0000 mg | ORAL_CAPSULE | Freq: Once | ORAL | Status: AC
  Administered 2016-01-08: 10 mg via ORAL
  Filled 2016-01-08: qty 1

## 2016-01-08 MED ORDER — CYCLOBENZAPRINE HCL 10 MG PO TABS
10.0000 mg | ORAL_TABLET | Freq: Once | ORAL | Status: AC
  Administered 2016-01-08: 10 mg via ORAL
  Filled 2016-01-08: qty 1

## 2016-01-08 NOTE — Progress Notes (Signed)
Patient ID: Melissa DaviesChastity L Johnston, female   DOB: 08/14/1981, 35 y.o.   MRN: [id number] FACULTY PRACTICE ANTEPARTUM(COMPREHENSIVE) NOTE  [name] is a 35 y.o. [id number] with Estimated Date of Delivery: 02/29/16   By  early ultrasound 4343w4d  who is admitted for Preterm labor.    Fetal presentation is cephalic. Length of Stay:  1  Days  Date of admission:01/07/2016  Subjective:  Patient reports the fetal movement as active. Patient reports uterine contraction  activity as irregular, every 15 minutes. Patient reports  vaginal bleeding as none. Patient describes fluid per vagina as None.  Vitals:  Blood pressure 128/75, pulse 97, temperature 97.9 F (36.6 C), temperature source Oral, resp. rate 16, height 5\' 3"  (1.6 m), weight 180 lb (81.647 kg), last menstrual period 06/11/2015, SpO2 98 %, not currently breastfeeding. Filed Vitals:   01/07/16 1908 01/07/16 2228 01/08/16 0305 01/08/16 0700  BP: 140/88 121/68 129/70 128/75  Pulse: 109 103 100 97  Temp: 98 F (36.7 C)  97.9 F (36.6 C) 97.9 F (36.6 C)  TempSrc: Oral  Oral Oral  Resp: 16 16 16 16   Height:      Weight:      SpO2:       Physical Examination:  General appearance - alert, well appearing, and in no distress Abdomen - soft, nontender, nondistended, no masses or organomegaly Fundal Height:  size equals dates Fetal Monitoring:  Baseline: 140 bpm, Variability: Good {> 6 bpm) and Accelerations: Reactive   reactive  Labs:  No results found for this or any previous visit (from the past 24 hour(s)).  Imaging Studies:     Medications:  Scheduled . citric acid-sodium citrate  30 mL Oral Once  . docusate sodium  100 mg Oral Daily  . NIFEdipine  10 mg Oral 4 times per day  . prenatal multivitamin  1 tablet Oral Q1200   I have reviewed the patient's current medications.  ASSESSMENT: [id number] 8943w4d Estimated Date of Delivery: 02/29/16  Preterm Labor   Patient Active Problem List   Diagnosis Date Noted  . Preterm  labor 01/07/2016  . Preterm labor in third trimester without delivery 01/07/2016  . Hypertension complicating pregnancy 01/07/2016  . Gestational diabetes mellitus, class A1 12/20/2015  . Monilial vaginitis 11/20/2015  . Nausea and vomiting during pregnancy prior to [redacted] weeks gestation 10/16/2015  . Abdominal pain 09/11/2015  . Rt flank pain 09/11/2015  . Hypokalemia 09/11/2015  . Leukocytosis 09/11/2015  . Anemia 09/11/2015  . Pyelonephritis 09/10/2015  . Right ovarian cyst 08/30/2015  . Supervision of normal pregnancy 08/09/2015    PLAN: Magnesium sulfate, hold procardia while on magnesium 2nd dose of betamethasone  Ayshia Gramlich H 01/08/2016,7:44 AM

## 2016-01-08 NOTE — Progress Notes (Signed)
Pt asked RN to eval area on perineum, states something is bulging out.  RN noted base of left labia swollen with lump.  Will ask MD to eval.

## 2016-01-08 NOTE — Progress Notes (Signed)
Progress Note [name] is a 35 y.o. [id number] at 4968w4d presented for PTL  S: Patient reports feeling pressure, esp when she going to the bathroom and "feels something coming out". + FM, no LOF, no VB. Reports intermittent severe back pain that feels like "back labor" and "feels him moving around her spine."  O:  BP 141/76 mmHg  Pulse 99  Temp(Src) 97.6 F (36.4 C) (Oral)  Resp 18  Ht 5\' 3"  (1.6 m)  Wt 180 lb (81.647 kg)  BMI 31.89 kg/m2  SpO2 98%  LMP 06/11/2015 (Approximate) EFM: 135/mod/+accels Toco: irritability or contractions every 8 minutes  CVE: Dilation: 3 Effacement (%): 50 Cervical Position: Posterior Station: Ballotable Presentation: Vertex Exam by:: Alvester MorinNewton MD   A&P: 35 y.o. [id number] 5968w4d here for PTL #PTL:  On Procardia 30 ml XL BID. Will try additional procardia IR dose now #Pain: patient has pain seeking behavior. Encouraged phenergan use and discussed that we will try to stop/lessen contractions.  #FWB: s/p betamethasone  #dispo: home tomorrow if no ctx and stable cervical exam.  Federico FlakeKimberly Niles Newton, MD 5:01 PM

## 2016-01-09 DIAGNOSIS — O163 Unspecified maternal hypertension, third trimester: Secondary | ICD-10-CM

## 2016-01-09 MED ORDER — NIFEDIPINE ER 30 MG PO TB24: 30.0000 mg | ORAL_TABLET | Freq: Two times a day (BID) | ORAL | Status: DC

## 2016-01-09 MED ORDER — NIFEDIPINE ER OSMOTIC RELEASE 30 MG PO TB24
30.0000 mg | ORAL_TABLET | Freq: Two times a day (BID) | ORAL | Status: DC
  Administered 2016-01-09: 30 mg via ORAL
  Filled 2016-01-09: qty 1

## 2016-01-09 NOTE — Discharge Instructions (Signed)
Preterm Labor Information °Preterm labor is when labor starts before you are [redacted] weeks pregnant. The normal length of pregnancy is 39 to 41 weeks.  °CAUSES  °The cause of preterm labor is not often known. The most common known cause is infection. °RISK FACTORS °· Having a history of preterm labor. °· Having your water break before it should. °· Having a placenta that covers the opening of the cervix. °· Having a placenta that breaks away from the uterus. °· Having a cervix that is too weak to hold the baby in the uterus. °· Having too much fluid in the amniotic sac. °· Taking drugs or smoking while pregnant. °· Not gaining enough weight while pregnant. °· Being younger than 18 and older than 35 years old. °· Having a low income. °· Being African American. °SYMPTOMS °· Period-like cramps, belly (abdominal) pain, or back pain. °· Contractions that are regular, as often as six in an hour. They may be mild or painful. °· Contractions that start at the top of the belly. They then move to the lower belly and back. °· Lower belly pressure that seems to get stronger. °· Bleeding from the vagina. °· Fluid leaking from the vagina. °TREATMENT  °Treatment depends on: °· Your condition. °· The condition of your baby. °· How many weeks pregnant you are. °Your doctor may have you: °· Take medicine to stop contractions. °· Stay in bed except to use the restroom (bed rest). °· Stay in the hospital. °WHAT SHOULD YOU DO IF YOU THINK YOU ARE IN PRETERM LABOR? °Call your doctor right away. You need to go to the hospital right away.  °HOW CAN YOU PREVENT PRETERM LABOR IN FUTURE PREGNANCIES? °· Stop smoking, if you smoke. °· Maintain healthy weight gain. °· Do not take drugs or be around chemicals that are not needed. °· Tell your doctor if you think you have an infection. °· Tell your doctor if you had a preterm labor before. °  °This information is not intended to replace advice given to you by your health care provider. Make sure you  discuss any questions you have with your health care provider. °  °Document Released: 03/07/2009 Document Revised: 04/25/2015 Document Reviewed: 01/11/2013 °Elsevier Interactive Patient Education ©2016 [employer] ° °

## 2016-01-09 NOTE — Discharge Summary (Signed)
OB Discharge Summary     Patient Name: [name] DOB: 07/16/1981 MRN: [id number]  Date of admission: 01/07/2016 Delivering MD: This patient has no babies on file.  Date of discharge: 01/09/2016  Admitting diagnosis: 33wks, pain, pressure, vomiting, dizziness, and lower back pain Intrauterine pregnancy: [redacted]w[redacted]d     Secondary diagnosis:  Active Problems:   Preterm labor   Preterm labor in third trimester without delivery   Hypertension complicating pregnancy     Discharge diagnosis: same                                                                                                Hospital course:  Patient was seen and admitted for threatened preterm labor.  She was given tocolytics and betamethasone.  She was transitioned from magnesium to procardia.  Her contractions are now mild and have spaced out and are infrequent.  She will be discharged to follow up with her primary OB in 2-3 days.  She was counseled to return if contractions increase or has spontaneous rupture of membranes, no prolonged standing or walking, no heavy lifting.  Physical exam  Filed Vitals:   01/08/16 1604 01/08/16 1920 01/08/16 2310 01/09/16 0305  BP: 141/76 113/55 115/68 113/59  Pulse: 99 116 90 78  Temp: 97.6 F (36.4 C) 98.3 F (36.8 C) 98 F (36.7 C) 97.8 F (36.6 C)  TempSrc: Oral Oral Oral Oral  Resp: Height:      Weight:      SpO2:       General: alert and no distress Heart: regular rate, no murmur Lungs: clear to auscultation bilaterally, no wheezing. Abdomen: soft, nontender, nondistended.  Gravid. DVT Evaluation: No evidence of DVT seen on physical exam. Negative Homan's sign. Labs: Lab Results  Component Value Date   WBC 10.6* 01/07/2016   HGB 9.8* 01/07/2016   HCT 31.1* 01/07/2016   MCV 78.5 01/07/2016   PLT 287 01/07/2016   CMP Latest Ref Rng 01/07/2016  Glucose 65 - 99 mg/dL 99  BUN 6 - 20 mg/dL 6  Creatinine 1.91 - 4.78 mg/dL 2.95  Sodium 621 - 308  mmol/L 137  Potassium 3.5 - 5.1 mmol/L 3.0(L)  Chloride 101 - 111 mmol/L 102  CO2 22 - 32 mmol/L 24  Calcium 8.9 - 10.3 mg/dL 9.8  Total Protein 6.5 - 8.1 g/dL 7.1  Total Bilirubin 0.3 - 1.2 mg/dL 0.5  Alkaline Phos 38 - 126 U/L 105  AST 15 - 41 U/L 30  ALT 14 - 54 U/L 19    Discharge instruction: per After Visit Summary and "Baby and Me Booklet".  After visit meds:    Medication List    STOP taking these medications        promethazine 25 MG tablet  Commonly known as:  PHENERGAN      TAKE these medications        acetaminophen 500 MG tablet  Commonly known as:  TYLENOL  Take 500 mg by mouth every 6 (six) hours as needed for mild pain or moderate pain.  aspirin EC 81 MG tablet  Take 1 tablet (81 mg total) by mouth daily.     calcium carbonate 500 MG chewable tablet  Commonly known as:  TUMS - dosed in mg elemental calcium  Chew 1 tablet by mouth daily.     NIFEdipine 30 MG 24 hr tablet  Commonly known as:  PROCARDIA-XL/ADALAT CC  Take 1 tablet (30 mg total) by mouth 2 (two) times daily.     prenatal vitamin w/FE, FA 27-1 MG Tabs tablet  Take 1 tablet by mouth daily at 12 noon.        Diet: routine diet  Activity: Advance as tolerated.   Outpatient follow up: 2-3 days Follow up Appt:Future Appointments Date Time Provider Department Center  01/10/2016 10:00 AM Jacklyn Shell, CNM [facility]   Follow up Visit:No Follow-up on file.   01/09/2016 STINSON, JACOB JEHIEL, DO

## 2016-01-10 ENCOUNTER — Other Ambulatory Visit: Payer: Self-pay | Admitting: Advanced Practice Midwife

## 2016-01-10 ENCOUNTER — Encounter (HOSPITAL_COMMUNITY): Payer: Self-pay

## 2016-01-10 ENCOUNTER — Other Ambulatory Visit: Payer: Medicaid Other

## 2016-01-10 ENCOUNTER — Ambulatory Visit (INDEPENDENT_AMBULATORY_CARE_PROVIDER_SITE_OTHER): Payer: Medicaid Other | Admitting: Advanced Practice Midwife

## 2016-01-10 ENCOUNTER — Inpatient Hospital Stay (HOSPITAL_COMMUNITY)
Admission: AD | Admit: 2016-01-10 | Discharge: 2016-01-10 | Disposition: A | Discharge status: Home / Self Care | Payer: Medicaid Other | Source: Ambulatory Visit | Attending: Obstetrics and Gynecology | Admitting: Obstetrics and Gynecology

## 2016-01-10 VITALS — BP 144/90 | HR 72 | Wt 190.0 lb

## 2016-01-10 DIAGNOSIS — Z87442 Personal history of urinary calculi: Secondary | ICD-10-CM | POA: Insufficient documentation

## 2016-01-10 DIAGNOSIS — Z3A33 33 weeks gestation of pregnancy: Secondary | ICD-10-CM

## 2016-01-10 DIAGNOSIS — O133 Gestational [pregnancy-induced] hypertension without significant proteinuria, third trimester: Secondary | ICD-10-CM

## 2016-01-10 DIAGNOSIS — Z1389 Encounter for screening for other disorder: Secondary | ICD-10-CM

## 2016-01-10 DIAGNOSIS — R51 Headache: Secondary | ICD-10-CM | POA: Diagnosis not present

## 2016-01-10 DIAGNOSIS — O09893 Supervision of other high risk pregnancies, third trimester: Secondary | ICD-10-CM

## 2016-01-10 DIAGNOSIS — Z3A32 32 weeks gestation of pregnancy: Secondary | ICD-10-CM | POA: Insufficient documentation

## 2016-01-10 DIAGNOSIS — O1403 Mild to moderate pre-eclampsia, third trimester: Secondary | ICD-10-CM | POA: Insufficient documentation

## 2016-01-10 DIAGNOSIS — O24410 Gestational diabetes mellitus in pregnancy, diet controlled: Secondary | ICD-10-CM

## 2016-01-10 DIAGNOSIS — Z331 Pregnant state, incidental: Secondary | ICD-10-CM

## 2016-01-10 LAB — CBC
HEMATOCRIT: 30.3 % — AB (ref 36.0–46.0)
HEMOGLOBIN: 9.5 g/dL — AB (ref 12.0–15.0)
MCH: 24.6 pg — AB (ref 26.0–34.0)
MCHC: 31.4 g/dL (ref 30.0–36.0)
MCV: 78.5 fL (ref 78.0–100.0)
Platelets: 291 10*3/uL (ref 150–400)
RBC: 3.86 MIL/uL — AB (ref 3.87–5.11)
RDW: 15.4 % (ref 11.5–15.5)
WBC: 12.5 10*3/uL — ABNORMAL HIGH (ref 4.0–10.5)

## 2016-01-10 LAB — COMPREHENSIVE METABOLIC PANEL
ALK PHOS: 101 U/L (ref 38–126)
ALT: 22 U/L (ref 14–54)
AST: 32 U/L (ref 15–41)
Albumin: 2.6 g/dL — ABNORMAL LOW (ref 3.5–5.0)
Anion gap: 10 (ref 5–15)
BILIRUBIN TOTAL: 0.4 mg/dL (ref 0.3–1.2)
BUN: 8 mg/dL (ref 6–20)
CO2: 20 mmol/L — ABNORMAL LOW (ref 22–32)
CREATININE: 0.77 mg/dL (ref 0.44–1.00)
Calcium: 8.7 mg/dL — ABNORMAL LOW (ref 8.9–10.3)
Chloride: 108 mmol/L (ref 101–111)
Glucose, Bld: 118 mg/dL — ABNORMAL HIGH (ref 65–99)
Potassium: 4 mmol/L (ref 3.5–5.1)
Sodium: 138 mmol/L (ref 135–145)
Total Protein: 6.6 g/dL (ref 6.5–8.1)

## 2016-01-10 LAB — POCT URINALYSIS DIPSTICK
Glucose, UA: NEGATIVE
KETONES UA: NEGATIVE
Leukocytes, UA: NEGATIVE
Nitrite, UA: NEGATIVE
PROTEIN UA: NEGATIVE
RBC UA: NEGATIVE

## 2016-01-10 LAB — PROTEIN / CREATININE RATIO, URINE
CREATININE, URINE: 36 mg/dL
Protein Creatinine Ratio: 0.39 mg/mg{Cre} — ABNORMAL HIGH (ref 0.00–0.15)
TOTAL PROTEIN, URINE: 14 mg/dL

## 2016-01-10 MED ORDER — BUTALBITAL-APAP-CAFFEINE 50-325-40 MG PO TABS: 1.0000 | ORAL_TABLET | Freq: Four times a day (QID) | ORAL | Status: DC | PRN

## 2016-01-10 MED ORDER — FENTANYL CITRATE (PF) 100 MCG/2ML IJ SOLN
100.0000 ug | Freq: Once | INTRAMUSCULAR | Status: AC
  Administered 2016-01-10: 100 ug via INTRAVENOUS
  Filled 2016-01-10: qty 2

## 2016-01-10 MED ORDER — CALCIUM CARBONATE ANTACID 500 MG PO CHEW
2.0000 | CHEWABLE_TABLET | Freq: Every day | ORAL | Status: DC
  Administered 2016-01-10: 400 mg via ORAL
  Filled 2016-01-10 (×2): qty 2

## 2016-01-10 MED ORDER — NIFEDIPINE ER OSMOTIC RELEASE 30 MG PO TB24
30.0000 mg | ORAL_TABLET | Freq: Once | ORAL | Status: AC
  Administered 2016-01-10: 30 mg via ORAL
  Filled 2016-01-10: qty 1

## 2016-01-10 MED ORDER — SODIUM CHLORIDE 0.9 % IV SOLN
8.0000 mg | Freq: Once | INTRAVENOUS | Status: AC
  Administered 2016-01-10: 8 mg via INTRAVENOUS
  Filled 2016-01-10: qty 4

## 2016-01-10 NOTE — Discharge Instructions (Signed)
Your labs show that you have mild pre-eclampsia which is a slight worsening of your gestational hypertension (elevated blood pressure during pregnancy). Your blood pressure was initially elevated but has come down nicely. We gave you some medications for your nausea and pain while you were in the MAU. Baby looked good on the monitor while you were in the MAU. Please keep your appointment at [facility] tomorrow for close follow up which is especially important as we need to closely monitor your blood pressure and closely monitor baby. Below is some information about pre-eclampsia. If you start having severe headaches that are not relieved by Tylenol, vision changes, right-sided upper belly pain that is constant, persistent vomiting please seek medical care immediately.   You will need to be seen at [facility] twice a week now due to the new diagnosis.   Preeclampsia and Eclampsia Preeclampsia is a serious condition that develops only during pregnancy. It is also called toxemia of pregnancy. This condition causes high blood pressure along with other symptoms, such as swelling and headaches. These may develop as the condition gets worse. Preeclampsia may occur 20 weeks or later into your pregnancy.  Diagnosing and treating preeclampsia early is very important. If not treated early, it can cause serious problems for you and your baby. One problem it can lead to is eclampsia, which is a condition that causes muscle jerking or shaking (convulsions) in the mother. Delivering your baby is the best treatment for preeclampsia or eclampsia.  RISK FACTORS The cause of preeclampsia is not known. You may be more likely to develop preeclampsia if you have certain risk factors. These include:   Being pregnant for the first time.  Having preeclampsia in a past pregnancy.  Having a family history of preeclampsia.  Having high blood pressure.  Being pregnant with twins or triplets.  Being 78 or  older.  Being African American.  Having kidney disease or diabetes.  Having medical conditions such as lupus or blood diseases.  Being very overweight (obese). SIGNS AND SYMPTOMS  The earliest signs of preeclampsia are:  High blood pressure.  Increased protein in your urine. Your health care provider will check for this at every prenatal visit. Other symptoms that can develop include:   Severe headaches.  Sudden weight gain.  Swelling of your hands, face, legs, and feet.  Feeling sick to your stomach (nauseous) and throwing up (vomiting).  Vision problems (blurred or double vision).  Numbness in your face, arms, legs, and feet.  Dizziness.  Slurred speech.  Sensitivity to bright lights.  Abdominal pain. DIAGNOSIS  There are no screening tests for preeclampsia. Your health care provider will ask you about symptoms and check for signs of preeclampsia during your prenatal visits. You may also have tests, including:  Urine testing.  Blood testing.  Checking your baby's heart rate.  Checking the health of your baby and your placenta using images created with sound waves (ultrasound). TREATMENT  You can work out the best treatment approach together with your health care provider. It is very important to keep all prenatal appointments. If you have an increased risk of preeclampsia, you may need more frequent prenatal exams.  Your health care provider may prescribe bed rest.  You may have to eat as little salt as possible.  You may need to take medicine to lower your blood pressure if the condition does not respond to more conservative measures.  You may need to stay in the hospital if your condition is  severe. There, treatment will focus on controlling your blood pressure and fluid retention. You may also need to take medicine to prevent seizures.  If the condition gets worse, your baby may need to be delivered early to protect you and the baby. You may have your  labor started with medicine (be induced), or you may have a cesarean delivery.  Preeclampsia usually goes away after the baby is born. HOME CARE INSTRUCTIONS   Only take over-the-counter or prescription medicines as directed by your health care provider.  Lie on your left side while resting. This keeps pressure off your baby.  Elevate your feet while resting.  Get regular exercise. Ask your health care provider what type of exercise is safe for you.  Avoid caffeine and alcohol.  Do not smoke.  Drink 6-8 glasses of water every day.  Eat a balanced diet that is low in salt. Do not add salt to your food.  Avoid stressful situations as much as possible.  Get plenty of rest and sleep.  Keep all prenatal appointments and tests as scheduled. SEEK MEDICAL CARE IF:  You are gaining more weight than expected.  You have any headaches, abdominal pain, or nausea.  You are bruising more than usual.  You feel dizzy or light-headed. SEEK IMMEDIATE MEDICAL CARE IF:   You develop sudden or severe swelling anywhere in your body. This usually happens in the legs.  You gain 5 lb (2.3 kg) or more in a week.  You have a severe headache, dizziness, problems with your vision, or confusion.  You have severe abdominal pain.  You have lasting nausea or vomiting.  You have a seizure.  You have trouble moving any part of your body.  You develop numbness in your body.  You have trouble speaking.  You have any abnormal bleeding.  You develop a stiff neck.  You pass out. MAKE SURE YOU:   Understand these instructions.  Will watch your condition.  Will get help right away if you are not doing well or get worse.   This information is not intended to replace advice given to you by your health care provider. Make sure you discuss any questions you have with your health care provider.   Document Released: 12/06/2000 Document Revised: 12/14/2013 Document Reviewed: 10/01/2013 Elsevier  Interactive Patient Education [employer]

## 2016-01-10 NOTE — MAU Provider Note (Signed)
History     CSN: [id number]  Arrival date and time: 01/10/16 2027   Chief Complaint  Patient presents with  . Contractions   HPI [name] is a 35yo [id number] at [redacted]w[redacted]d by 9wk [place] presenting with contractions and the urge to push. She also started having emesis on presentation to MAU.   Patient reports she started having contractions and the urge to push about two hours prior to presentation. She also note of vaginal burning during these contractions. She states that earlier this evening she noticed a gush of fluid from her vagina while taking a shower. She has not had any LOF since then. Also notes of light vaginal spotting that started today.   Patient is diagnosed with gestational HTN during this pregnancy. Patient notes of constant right sided headache not relieved by tylenol, vision changes in her right visual field, and sharp RUQ pain that began yesterday. Also reports of new swelling in her right leg and hand, and her face which she noticed yesterday.   No fevers or chills. No history of gall stones. Notes of history of kidney stones. No sick contacts.   Per chart review, patient was admitted on 1/15 (3 days stay) for threatened preterm labor. She was given tocolytics and betamethasone, and was transitioned from Magnesium to Procardia. She was discharged with Procardia. Patient states she has been taking Procardia since discharge.   She was seen in clinic this morning 1/18. Per chart review she reported of HA behind L eye for several days which is not a new problem for her. She was prescribed Fioricet.   Past Medical History  Diagnosis Date  . Ovarian cyst   . Kidney stones   . Pregnant 08/09/2015  . Pregnancy induced hypertension     Past Surgical History  Procedure Laterality Date  . Kidney stents    . Lithotripsy    . Appendectomy      Family History  Problem Relation Age of Onset  . Cancer Mother     breast  . Diabetes Maternal Grandmother     Social  History  Substance Use Topics  . Smoking status: Never Smoker   . Smokeless tobacco: Never Used  . Alcohol Use: No    Allergies: No Known Allergies  Prescriptions prior to admission  Medication Sig Dispense Refill Last Dose  . acetaminophen (TYLENOL) 500 MG tablet Take 500 mg by mouth every 6 (six) hours as needed for mild pain or moderate pain.   Taking  . aspirin EC 81 MG tablet Take 1 tablet (81 mg total) by mouth daily. 30 tablet 6 Taking  . butalbital-acetaminophen-caffeine (FIORICET) 50-325-40 MG tablet Take 1-2 tablets by mouth every 6 (six) hours as needed for headache. 20 tablet 0   . calcium carbonate (TUMS - DOSED IN MG ELEMENTAL CALCIUM) 500 MG chewable tablet Chew 1 tablet by mouth daily.   Taking  . NIFEdipine (PROCARDIA-XL/ADALAT CC) 30 MG 24 hr tablet Take 1 tablet (30 mg total) by mouth 2 (two) times daily. 60 tablet 1 Taking  . prenatal vitamin w/FE, FA (PRENATAL 1 + 1) 27-1 MG TABS tablet Take 1 tablet by mouth daily at 12 noon. 30 each 0 Taking    ROS: as noted above Physical Exam   Blood pressure 152/76, pulse 87, temperature 98.3 F (36.8 C), temperature source Oral, resp. rate 26, last menstrual period 06/11/2015, not currently breastfeeding.  Physical Exam Gen: in distress due to pain and emesis Heart: RRR, no m/r/g Lungs: CTAB, no  crackles or wheezing Abdomen: reports tenderness to palpation of RUQ, no guarding  Extremities: no LE edema; brisk patellar reflexes bilaterally   FHT: 150 bpm baseline, mod variability, + accelerations.   MAU Course  Procedures  MDM:  Need to rule out preterm labor and pre-eclampsia. Patient's blood pressure was initially elevated on presentation to 152/76. - IV Zofran and Fentanyl for nausea and pain - CBC, CMP, Urine Pr/Cr ratio ordered  - Speculum exam: no pooling of fluid in posterior fossa. Fern test negative.  - CBC shows normal platelets and stable hemoglobin. Mildly elevated WBC.  - CMP shows LFTs within normal  limits. Creatinine is stable - Protein/Creatinine Ratio elevated at 0.39  - Emesis resolved with medication; will give evening dose of Procardia; Tums   - repeat blood pressure 130s/70s.   Assessment and Plan  New diagnosis of mild pre-eclampsia with elevated protein-creatinine ratio. CBC unremarkable, CMP with normal creatinine. Blood pressure has improved since presentation. Patient's emesis has resolved. Preterm labor unlikely as cervical exam has not changed. PPROM not likely as fern test negative and no pooling noted on speculum exam. Discussed patient with Dr. Emelda Fear, who is comfortable with discharging patient with close follow up at [facility] on 01/10/15. Patient will need twice weekly clinic visits for NSTs and serial BP monitoring. Return precautions given Patient states she has anti-nausea medication at home  Palma Holter 01/10/2016, 9:48 PM   CNM attestation:  I have seen and examined this patient; I agree with above documentation in the resident's note.   [name] is a 35 y.o. [id number] reporting N/V, H/A, and ctx +FM, denies VB, vaginal discharge.  PE:  BPs: 131/79, 131/73, 133/76 BP 151/90 mmHg  Pulse 72  Temp(Src) 98.3 F (36.8 C) (Oral)  Resp 26  LMP 06/11/2015 (Approximate) Gen: calm comfortable, NAD Resp: normal effort, no distress Abd: gravid  ROS, labs, PMH reviewed NST reactive; intermittent ctx  Urine P/C ratio: 0.39  Plan: - fetal kick counts reinforced, preterm labor precautions - mild preeclampsia - continue routine follow up in OB clinic on 01/11/16  Cam Hai, CNM 6:01 AM  01/11/2016

## 2016-01-10 NOTE — Progress Notes (Signed)
Fetal Surveillance Testing today:  doppler   High Risk Pregnancy Diagnosis(es):   GHTN, A1DM  [id number] [redacted]w[redacted]d Estimated Date of Delivery: 02/29/16  Blood pressure 144/90, pulse 72, weight 190 lb (86.183 kg), last menstrual period 06/11/2015, not currently breastfeeding.  Urinalysis: Negative  HPI: The patient is being seen today for ongoing management of GHTN. She was DC'd from [facility] yesterday after a 3 day stay for threatened PTL.  cx was 3/50/ballot. She received MgS04 and BMZ. Contractions are "still there, but not any worse"   She also had pain medicine seeking behavior, c/w prior pregnancies. Never went to nutrition class, doesn't have meter  (got counseling in hospital first admission)   Today she reports HA behind left eye for several days. This is not a new problem for her  BP weight and urine results all reviewed and noted. Patient reports good fetal movement, denies any bleeding and no rupture of membranes symptoms  Fetal Heart rate:  150 Edema:  No. Pt reports swelling in right ankle and hand, but none today. Cervix:  Outer os 4, inner os more like 2-3; thick/ -1/vtx  Patient is without complaints other than noted in her HPI. All questions were answered.  All lab and sonogram results have been reviewed. Comments: none   Assessment:  1.  Pregnancy at [redacted]w[redacted]d,  Estimated Date of Delivery: 02/29/16 :                          2.  GHTN                        3.  Threatened PTL   4. Gestational diabetes  Medication(s) Plans:  Continue procardia Xl  BID. Continue ASA . Rx Fioricet  Treatment Plan:  Start twice weekly testing (US/NST).  Probably IOL at 37 weeks.  Rx for glucometer and QID testing discussed.  To bring log to every visit  Return in 1 day (on 01/11/2016) for HROB, US:BPP. for appointment for high risk OB care  Meds ordered this encounter  Medications  . butalbital-acetaminophen-caffeine (FIORICET) 50-325-40 MG tablet    Sig: Take 1-2 tablets by mouth every 6  (six) hours as needed for headache.    Dispense:  20 tablet    Refill:  0    Order Specific Question:  Supervising Provider    Answer:  Duane Lope H [[id number]]   Orders Placed This Encounter  Procedures  . US OB Follow Up  . US FETAL BPP WO NON STRESS  . POCT urinalysis dipstick

## 2016-01-10 NOTE — MAU Note (Signed)
Pt presents stating that she feels pressure and needs to push. Denies leaking or bleeding. Reports good fetal movement.

## 2016-01-11 ENCOUNTER — Other Ambulatory Visit (INDEPENDENT_AMBULATORY_CARE_PROVIDER_SITE_OTHER): Payer: Medicaid Other

## 2016-01-11 ENCOUNTER — Encounter: Payer: Medicaid Other | Admitting: Obstetrics & Gynecology

## 2016-01-11 ENCOUNTER — Encounter: Payer: Self-pay | Admitting: Advanced Practice Midwife

## 2016-01-11 ENCOUNTER — Other Ambulatory Visit: Payer: Self-pay | Admitting: Obstetrics and Gynecology

## 2016-01-11 ENCOUNTER — Other Ambulatory Visit: Payer: Medicaid Other | Admitting: Obstetrics & Gynecology

## 2016-01-11 DIAGNOSIS — O10913 Unspecified pre-existing hypertension complicating pregnancy, third trimester: Secondary | ICD-10-CM

## 2016-01-11 DIAGNOSIS — O24419 Gestational diabetes mellitus in pregnancy, unspecified control: Secondary | ICD-10-CM

## 2016-01-11 DIAGNOSIS — O24410 Gestational diabetes mellitus in pregnancy, diet controlled: Secondary | ICD-10-CM

## 2016-01-11 DIAGNOSIS — O1403 Mild to moderate pre-eclampsia, third trimester: Secondary | ICD-10-CM

## 2016-01-11 DIAGNOSIS — O133 Gestational [pregnancy-induced] hypertension without significant proteinuria, third trimester: Secondary | ICD-10-CM

## 2016-01-11 DIAGNOSIS — O09893 Supervision of other high risk pregnancies, third trimester: Secondary | ICD-10-CM

## 2016-01-11 NOTE — Progress Notes (Signed)
[place] 33wks,cephalic,post pl gr 3,simple cyst rt ov n/c,normal lt ov,fhr 167 bpm,afi 14.3cm,RI .58,.54,efw 2296g 60%,BPP 8/8

## 2016-01-13 ENCOUNTER — Inpatient Hospital Stay (HOSPITAL_COMMUNITY)
Admission: AD | Admit: 2016-01-13 | Discharge: 2016-01-15 | DRG: 775 | Disposition: A | Discharge status: Home / Self Care | Payer: Medicaid Other | Source: Ambulatory Visit | Attending: Obstetrics & Gynecology | Admitting: Obstetrics & Gynecology

## 2016-01-13 ENCOUNTER — Encounter (HOSPITAL_COMMUNITY): Payer: Self-pay | Admitting: *Deleted

## 2016-01-13 DIAGNOSIS — O24410 Gestational diabetes mellitus in pregnancy, diet controlled: Secondary | ICD-10-CM

## 2016-01-13 DIAGNOSIS — O09893 Supervision of other high risk pregnancies, third trimester: Secondary | ICD-10-CM

## 2016-01-13 DIAGNOSIS — O1404 Mild to moderate pre-eclampsia, complicating childbirth: Secondary | ICD-10-CM | POA: Diagnosis present

## 2016-01-13 DIAGNOSIS — D649 Anemia, unspecified: Secondary | ICD-10-CM | POA: Diagnosis not present

## 2016-01-13 DIAGNOSIS — O9081 Anemia of the puerperium: Secondary | ICD-10-CM | POA: Diagnosis not present

## 2016-01-13 DIAGNOSIS — Z3A33 33 weeks gestation of pregnancy: Secondary | ICD-10-CM

## 2016-01-13 DIAGNOSIS — Z833 Family history of diabetes mellitus: Secondary | ICD-10-CM

## 2016-01-13 DIAGNOSIS — O24429 Gestational diabetes mellitus in childbirth, unspecified control: Secondary | ICD-10-CM | POA: Diagnosis present

## 2016-01-13 DIAGNOSIS — O1403 Mild to moderate pre-eclampsia, third trimester: Secondary | ICD-10-CM

## 2016-01-13 LAB — WET PREP, GENITAL
CLUE CELLS WET PREP: NONE SEEN
Sperm: NONE SEEN
Trich, Wet Prep: NONE SEEN
Yeast Wet Prep HPF POC: NONE SEEN

## 2016-01-13 LAB — COMPREHENSIVE METABOLIC PANEL
ALK PHOS: 100 U/L (ref 38–126)
ALT: 31 U/L (ref 14–54)
AST: 54 U/L — AB (ref 15–41)
Albumin: 2.6 g/dL — ABNORMAL LOW (ref 3.5–5.0)
Anion gap: 12 (ref 5–15)
BUN: 7 mg/dL (ref 6–20)
CALCIUM: 8.5 mg/dL — AB (ref 8.9–10.3)
CHLORIDE: 106 mmol/L (ref 101–111)
CO2: 20 mmol/L — AB (ref 22–32)
CREATININE: 0.74 mg/dL (ref 0.44–1.00)
GFR calc Af Amer: >60 mL/min (ref >60)
GFR calc non Af Amer: >60 mL/min (ref >60)
GLUCOSE: 106 mg/dL — AB (ref 65–99)
Potassium: 3.6 mmol/L (ref 3.5–5.1)
SODIUM: 138 mmol/L (ref 135–145)
Total Bilirubin: 0.3 mg/dL (ref 0.3–1.2)
Total Protein: 6.7 g/dL (ref 6.5–8.1)

## 2016-01-13 LAB — CBC
HEMATOCRIT: 31.5 % — AB (ref 36.0–46.0)
HEMOGLOBIN: 10.2 g/dL — AB (ref 12.0–15.0)
MCH: 25 pg — ABNORMAL LOW (ref 26.0–34.0)
MCHC: 32.4 g/dL (ref 30.0–36.0)
MCV: 77.2 fL — ABNORMAL LOW (ref 78.0–100.0)
Platelets: 288 10*3/uL (ref 150–400)
RBC: 4.08 MIL/uL (ref 3.87–5.11)
RDW: 15.8 % — AB (ref 11.5–15.5)
WBC: 13.3 10*3/uL — AB (ref 4.0–10.5)

## 2016-01-13 LAB — TYPE AND SCREEN
ABO/RH(D): O POS
ANTIBODY SCREEN: NEGATIVE

## 2016-01-13 LAB — GROUP B STREP BY PCR: GROUP B STREP BY PCR: NEGATIVE

## 2016-01-13 MED ORDER — CITRIC ACID-SODIUM CITRATE 334-500 MG/5ML PO SOLN: 30.0000 mL | ORAL | Status: DC | PRN

## 2016-01-13 MED ORDER — ONDANSETRON HCL 4 MG/2ML IJ SOLN
4.0000 mg | INTRAMUSCULAR | Status: DC | PRN
  Administered 2016-01-13: 4 mg via INTRAVENOUS
  Filled 2016-01-13: qty 2

## 2016-01-13 MED ORDER — LACTATED RINGERS IV SOLN: 500.0000 mL | INTRAVENOUS | Status: DC | PRN

## 2016-01-13 MED ORDER — IBUPROFEN 600 MG PO TABS
600.0000 mg | ORAL_TABLET | Freq: Four times a day (QID) | ORAL | Status: DC
  Administered 2016-01-13 – 2016-01-15 (×8): 600 mg via ORAL
  Filled 2016-01-13 (×8): qty 1

## 2016-01-13 MED ORDER — PHENYLEPHRINE 40 MCG/ML (10ML) SYRINGE FOR IV PUSH (FOR BLOOD PRESSURE SUPPORT)
80.0000 ug | PREFILLED_SYRINGE | INTRAVENOUS | Status: DC | PRN
  Filled 2016-01-13: qty 2

## 2016-01-13 MED ORDER — FENTANYL 2.5 MCG/ML BUPIVACAINE 1/10 % EPIDURAL INFUSION (WH - ANES): 14.0000 mL/h | INTRAMUSCULAR | Status: DC | PRN

## 2016-01-13 MED ORDER — SIMETHICONE 80 MG PO CHEW: 80.0000 mg | CHEWABLE_TABLET | ORAL | Status: DC | PRN

## 2016-01-13 MED ORDER — EPHEDRINE 5 MG/ML INJ
10.0000 mg | INTRAVENOUS | Status: DC | PRN
  Filled 2016-01-13: qty 2

## 2016-01-13 MED ORDER — SODIUM CHLORIDE 0.9 % IV SOLN
2.0000 g | Freq: Four times a day (QID) | INTRAVENOUS | Status: DC
  Administered 2016-01-13: 2 g via INTRAVENOUS
  Filled 2016-01-13 (×3): qty 2000

## 2016-01-13 MED ORDER — FENTANYL CITRATE (PF) 100 MCG/2ML IJ SOLN
100.0000 ug | INTRAMUSCULAR | Status: DC | PRN
  Administered 2016-01-13: 100 ug via INTRAVENOUS
  Filled 2016-01-13: qty 2

## 2016-01-13 MED ORDER — LANOLIN HYDROUS EX OINT
TOPICAL_OINTMENT | CUTANEOUS | Status: DC | PRN
Start: 2016-01-13 — End: 2016-01-15

## 2016-01-13 MED ORDER — OXYTOCIN 10 UNIT/ML IJ SOLN
2.5000 [IU]/h | INTRAVENOUS | Status: DC
  Administered 2016-01-13: 11:00:00 via INTRAVENOUS
  Filled 2016-01-13: qty 4

## 2016-01-13 MED ORDER — LACTATED RINGERS IV SOLN
INTRAVENOUS | Status: DC
  Administered 2016-01-13: 10:00:00 via INTRAVENOUS

## 2016-01-13 MED ORDER — WITCH HAZEL-GLYCERIN EX PADS: 1.0000 "application " | MEDICATED_PAD | CUTANEOUS | Status: DC | PRN

## 2016-01-13 MED ORDER — LIDOCAINE HCL (PF) 1 % IJ SOLN
30.0000 mL | INTRAMUSCULAR | Status: DC | PRN
  Filled 2016-01-13: qty 30

## 2016-01-13 MED ORDER — ONDANSETRON HCL 4 MG PO TABS
4.0000 mg | ORAL_TABLET | ORAL | Status: DC | PRN
  Administered 2016-01-13 – 2016-01-14 (×2): 4 mg via ORAL
  Filled 2016-01-13 (×2): qty 1

## 2016-01-13 MED ORDER — DIPHENHYDRAMINE HCL 50 MG/ML IJ SOLN: 12.5000 mg | INTRAMUSCULAR | Status: DC | PRN

## 2016-01-13 MED ORDER — OXYCODONE-ACETAMINOPHEN 5-325 MG PO TABS
2.0000 | ORAL_TABLET | ORAL | Status: DC | PRN
  Administered 2016-01-14 – 2016-01-15 (×5): 2 via ORAL
  Filled 2016-01-13 (×5): qty 2

## 2016-01-13 MED ORDER — DIBUCAINE 1 % RE OINT
1.0000 | TOPICAL_OINTMENT | RECTAL | Status: DC | PRN
Start: 2016-01-13 — End: 2016-01-15

## 2016-01-13 MED ORDER — ONDANSETRON HCL 4 MG/2ML IJ SOLN: 4.0000 mg | Freq: Four times a day (QID) | INTRAMUSCULAR | Status: DC | PRN

## 2016-01-13 MED ORDER — PROMETHAZINE HCL 25 MG/ML IJ SOLN
12.5000 mg | Freq: Four times a day (QID) | INTRAMUSCULAR | Status: DC | PRN
  Administered 2016-01-13 (×2): 12.5 mg via INTRAVENOUS
  Filled 2016-01-13 (×2): qty 1

## 2016-01-13 MED ORDER — NIFEDIPINE ER 30 MG PO TB24
30.0000 mg | ORAL_TABLET | ORAL | Status: AC
  Administered 2016-01-13: 30 mg via ORAL
  Filled 2016-01-13: qty 1

## 2016-01-13 MED ORDER — OXYTOCIN BOLUS FROM INFUSION
500.0000 mL | INTRAVENOUS | Status: DC
  Administered 2016-01-13: 500 mL via INTRAVENOUS

## 2016-01-13 MED ORDER — BETAMETHASONE SOD PHOS & ACET 6 (3-3) MG/ML IJ SUSP
12.0000 mg | Freq: Once | INTRAMUSCULAR | Status: AC
  Administered 2016-01-13: 12 mg via INTRAMUSCULAR
  Filled 2016-01-13: qty 2

## 2016-01-13 MED ORDER — PROMETHAZINE HCL 25 MG/ML IJ SOLN: 12.5000 mg | Freq: Four times a day (QID) | INTRAMUSCULAR | Status: DC | PRN

## 2016-01-13 MED ORDER — ZOLPIDEM TARTRATE 5 MG PO TABS
5.0000 mg | ORAL_TABLET | Freq: Every evening | ORAL | Status: DC | PRN
  Administered 2016-01-14: 5 mg via ORAL
  Filled 2016-01-13: qty 1

## 2016-01-13 MED ORDER — ACETAMINOPHEN 325 MG PO TABS: 650.0000 mg | ORAL_TABLET | ORAL | Status: DC | PRN

## 2016-01-13 MED ORDER — BENZOCAINE-MENTHOL 20-0.5 % EX AERO
1.0000 | INHALATION_SPRAY | CUTANEOUS | Status: DC | PRN
Start: 2016-01-13 — End: 2016-01-15

## 2016-01-13 MED ORDER — PRENATAL MULTIVITAMIN CH
1.0000 | ORAL_TABLET | Freq: Every day | ORAL | Status: DC
  Administered 2016-01-13 – 2016-01-14 (×2): 1 via ORAL
  Filled 2016-01-13 (×2): qty 1

## 2016-01-13 MED ORDER — SENNOSIDES-DOCUSATE SODIUM 8.6-50 MG PO TABS
2.0000 | ORAL_TABLET | ORAL | Status: DC
  Administered 2016-01-13 – 2016-01-14 (×2): 2 via ORAL
  Filled 2016-01-13 (×2): qty 2

## 2016-01-13 MED ORDER — TETANUS-DIPHTH-ACELL PERTUSSIS 5-2.5-18.5 LF-MCG/0.5 IM SUSP: 0.5000 mL | Freq: Once | INTRAMUSCULAR | Status: DC

## 2016-01-13 MED ORDER — DIPHENHYDRAMINE HCL 25 MG PO CAPS: 25.0000 mg | ORAL_CAPSULE | Freq: Four times a day (QID) | ORAL | Status: DC | PRN

## 2016-01-13 MED ORDER — OXYCODONE-ACETAMINOPHEN 5-325 MG PO TABS
1.0000 | ORAL_TABLET | ORAL | Status: DC | PRN
  Administered 2016-01-13 (×5): 1 via ORAL
  Filled 2016-01-13 (×5): qty 1

## 2016-01-13 MED ORDER — AMPICILLIN SODIUM 1 G IJ SOLR: 1.0000 g | Freq: Four times a day (QID) | INTRAMUSCULAR | Status: DC

## 2016-01-13 NOTE — H&P (Signed)
LABOR ADMISSION HISTORY AND PHYSICAL  [name] is a 35 y.o. female [id number] with IUP at [redacted]w[redacted]d by u/s presenting in active labor.  Pt began experiencing painful contractions around 4:30AM this morning.  They became more frequent, every 2 minutes and she came to hospital.  Denies VB, LOF.  She reports good FM.  No blurry vision, headaches, peripheral edema, or RUQ pain.  PT had recent hospital admission for threatened PTL on 1/15.  Received Betamethasone x2 and magnesium sulfate before dc.  She plans on bottle feeding. She request interval BTL for birth control.  Dating: By Gaetana Michaelis u/s --->  Estimated Date of Delivery: 02/29/16  Sono:    @ 33wks,cephalic,post pl gr 3,simple cyst rt ov n/c,normal lt ov,fhr 167 bpm,afi 14.3cm,RI .58,.54,efw 2296g 60%,BPP 8/8   Prenatal History/Complications: Pre-eclampsia A1DM - hasn't started checking blood sugars Threatened PTL  Past Medical History: Past Medical History  Diagnosis Date  . Ovarian cyst   . Kidney stones   . Pregnant 08/09/2015  . Pregnancy induced hypertension     Past Surgical History: Past Surgical History  Procedure Laterality Date  . Kidney stents    . Lithotripsy    . Appendectomy      Obstetrical History: OB History    Gravida Para Term Preterm AB TAB SAB Ectopic Multiple Living   Social History: Social History   Social History  . Marital Status: Married    Spouse Name: N/A  . Number of Children: N/A  . Years of Education: N/A   Social History Main Topics  . Smoking status: Never Smoker   . Smokeless tobacco: Never Used  . Alcohol Use: No  . Drug Use: No  . Sexual Activity: Yes    Birth Control/ Protection: None   Other Topics Concern  . None   Social History Narrative    Family History: Family History  Problem Relation Age of Onset  . Cancer Mother     breast  . Diabetes Maternal Grandmother     Allergies: No Known Allergies  Prescriptions prior to admission   Medication Sig Dispense Refill Last Dose  . aspirin EC 81 MG tablet Take 1 tablet (81 mg total) by mouth daily. 30 tablet 6 Past Week at Unknown time  . butalbital-acetaminophen-caffeine (FIORICET) 50-325-40 MG tablet Take 1-2 tablets by mouth every 6 (six) hours as needed for headache. 20 tablet 0 01/10/2016 at Unknown time  . calcium carbonate (TUMS - DOSED IN MG ELEMENTAL CALCIUM) 500 MG chewable tablet Chew 1 tablet by mouth daily.   01/10/2016 at Unknown time  . NIFEdipine (PROCARDIA-XL/ADALAT CC) 30 MG 24 hr tablet Take 1 tablet (30 mg total) by mouth 2 (two) times daily. 60 tablet 1 01/10/2016 at Unknown time  . prenatal vitamin w/FE, FA (PRENATAL 1 + 1) 27-1 MG TABS tablet Take 1 tablet by mouth daily at 12 noon. 30 each 0 01/10/2016 at Unknown time     Review of Systems   All systems reviewed and negative except as stated in HPI  BP 181/103 mmHg  Pulse 126  Temp(Src) 98.1 F (36.7 C) (Oral)  Resp 24  LMP 06/11/2015 (Approximate) General appearance: alert, appears stated age, moderate distress and in pain from ctx Lungs: clear to auscultation bilaterally Heart: regular rate and rhythm Abdomen: soft, non-tender; bowel sounds normal Pelvic: adequate Extremities: Homans sign is negative, no sign of DVT, edema DTR's nl Presentation: cephalic  Fetal monitoringBaseline: 140 bpm, Variability: Good {> 6 bpm) and Accelerations: Reactive Uterine activityFrequency: Every 2 minutes Dilation: 10 Effacement (%): 100 Station: Crowning Exam by:: Hmitchell,rnc-ob   Prenatal labs: ABO, Rh: --/--/O POS (01/15 1610) Antibody: NEG (01/15 0624) Rubella: !Error! immune RPR: Non Reactive (12/07 0909)  HBsAg: Negative (08/17 1203)  HIV: Non Reactive (12/07 0909)  GBS:   too early  2 hr Glucola 127/198/122 Genetic screening AFP neg Anatomy US nl  Prenatal Transfer Tool  Maternal Diabetes: A1DM Genetic Screening: Normal Maternal Ultrasounds/Referrals: Normal Fetal Ultrasounds or other  Referrals:  None Maternal Substance Abuse:  No Significant Maternal Medications:  None Significant Maternal Lab Results: None  Results for orders placed or performed during the hospital encounter of 01/13/16 (from the past 24 hour(s))  Wet prep, genital   Collection Time: 01/13/16 10:15 AM  Result Value Ref Range   Yeast Wet Prep HPF POC NONE SEEN NONE SEEN   Trich, Wet Prep NONE SEEN NONE SEEN   Clue Cells Wet Prep HPF POC NONE SEEN NONE SEEN   WBC, Wet Prep HPF POC MANY (A) NONE SEEN   Sperm NONE SEEN   CBC   Collection Time: 01/13/16 10:25 AM  Result Value Ref Range   WBC 13.3 (H) 4.0 - 10.5 K/uL   RBC 4.08 3.87 - 5.11 MIL/uL   Hemoglobin 10.2 (L) 12.0 - 15.0 g/dL   HCT 96.0 (L) 45.4 - 09.8 %   MCV 77.2 (L) 78.0 - 100.0 fL   MCH 25.0 (L) 26.0 - 34.0 pg   MCHC 32.4 30.0 - 36.0 g/dL   RDW 11.9 (H) 14.7 - 82.9 %   Platelets 288 150 - 400 K/uL    Patient Active Problem List   Diagnosis Date Noted  . Active preterm labor 01/13/2016  . Preterm labor 01/07/2016  . Preterm labor in third trimester without delivery 01/07/2016  . Mild preeclampsia 01/07/2016  . Gestational diabetes mellitus, class A1 12/20/2015  . Abdominal pain 09/11/2015  . Rt flank pain 09/11/2015  . Hypokalemia 09/11/2015  . Leukocytosis 09/11/2015  . Anemia 09/11/2015  . Pyelonephritis 09/10/2015  . Right ovarian cyst 08/30/2015  . Supervision of other high-risk pregnancy 08/09/2015    Assessment: [name] is a 35 y.o. F6O1308 at [redacted]w[redacted]d here for active labor.  In MAU cervical exam was 6cm dilation with ctx q67m. Pt was immediately moved to [employer].   # Labor: active labor  -admit to L&D with routine orders -anticipate precipitous delivery  #Pre-eclampsia: recent diagnosis, prior to admit -continue to monitor BP -CBC, CMP, Pr/Cr pending   #Pre-term labor:  -procardia, ampicillin, betamethasone    # Fetal Wellbeing: fetus well appearing, Category 1 -Continue monitoring    # Pain Control:  -analgesia prn  # GBS: unknown -ampicillin  -collected GBS sample; along with wet prep and GC probe  # Postpartum plan:  -Feeding: bottle -Contraception: interval BTL  Wynne Dust, MD, PGY-1 OB  I spoke with and examined patient and agree with resident/PA/SNM's note and plan of care.  Active PTL, BMZ x 1 given stat prior to being able to review chart- pt recently admitted 1/15-1/17 for threatened ptl and received bmz and mag at that time. Wet prep, gc/ct, and gbs collected. Ampcillin ordered for gbs unknown/advanced labor.  GHTN dx earlier in pregnancy, recently dx w/ pre-e w/o severe features on 1/18 d/t p:c ratio 0.39. Pre-e labs pending. Denies any s/s.  A1DM, per 1/18 note- never went to class, was rx'd  glucometer/supplies 1/18- reports she hasn't started checking sugars.  Discussed w/ Dr. Macon Large on admit- will hold off on mag for now, will give 1 dose of procardia for attempt at tocolysis.  Plans bottlefeeding, interval salpingectomy, OP circ Cheral Marker, CNM, [facility] 01/13/2016 11:32 AM

## 2016-01-13 NOTE — MAU Note (Signed)
Pt crying, in a lot of pain.  States uc's started around 0430, denies bleeding or LOF.

## 2016-01-13 NOTE — MAU Note (Signed)
Notified NICU of impending delivery.

## 2016-01-14 LAB — RPR: RPR: NONREACTIVE

## 2016-01-14 LAB — HIV ANTIBODY (ROUTINE TESTING W REFLEX): HIV SCREEN 4TH GENERATION: NONREACTIVE

## 2016-01-14 NOTE — Plan of Care (Signed)
Problem: Life Cycle: Goal: Risk for postpartum hemorrhage will decrease Outcome: Completed/Met Date Met:  01/14/16 Vaginal bleeding has been minimal. Goal: Chance of risk for complications during the postpartum period will decrease Outcome: Progressing Patient has to be encouraged to ambulate.  Problem: Role Relationship: Goal: Ability to demonstrate positive interaction with newborn will improve Outcome: Completed/Met Date Met:  01/14/16 Patient and family visit NICU frequently.  Problem: Pain Management: Goal: General experience of comfort will improve and pain level will decrease Outcome: Not Progressing Patient continues to state that her pain is around a 7 but can tolerate a 5.

## 2016-01-14 NOTE — Progress Notes (Signed)
Post Partum Day 1 s/p preterm SVD at [redacted]w[redacted]d Subjective: no complaints, up ad lib, voiding, tolerating PO and + flatus.  Baby is doing well in NICU.   Objective: Blood pressure 128/75, pulse 85, temperature 97.9 F (36.6 C), temperature source Oral, resp. rate 16, last menstrual period 06/11/2015, SpO2 99 %, unknown if currently breastfeeding.  Physical Exam:  General: alert and no distress Lochia: appropriate Uterine Fundus: firm DVT Evaluation: No evidence of DVT seen on physical exam. Negative Homan's sign.   Recent Labs  01/13/16 1025  HGB 10.2*  HCT 31.5*    Assessment/Plan: Plan for discharge tomorrow, Breastfeeding and Contraception BTS  Routine postpartum care   LOS: 1 day   Nashonda Limberg A, MD 01/14/2016, 7:39 AM

## 2016-01-15 ENCOUNTER — Other Ambulatory Visit: Payer: Medicaid Other

## 2016-01-15 ENCOUNTER — Other Ambulatory Visit: Payer: Medicaid Other | Admitting: Obstetrics & Gynecology

## 2016-01-15 ENCOUNTER — Encounter: Payer: Medicaid Other | Admitting: Obstetrics & Gynecology

## 2016-01-15 ENCOUNTER — Telehealth: Payer: Self-pay | Admitting: Advanced Practice Midwife

## 2016-01-15 LAB — GC/CHLAMYDIA PROBE AMP (~~LOC~~) NOT AT ARMC
Chlamydia: NEGATIVE
Neisseria Gonorrhea: NEGATIVE

## 2016-01-15 MED ORDER — IBUPROFEN 600 MG PO TABS: 600.0000 mg | ORAL_TABLET | Freq: Four times a day (QID) | ORAL | Status: DC

## 2016-01-15 NOTE — Telephone Encounter (Signed)
Patient called stating that she had her baby on Saturday and is having a little bit of anxiety, or something with her nervous. Pt states that fran told her if she starts feeling nervous or anything to give her a call and we could call her in something. Please contact pt

## 2016-01-15 NOTE — Discharge Summary (Signed)
OB Discharge Summary     Patient Name: [name] DOB: Mar 07, 1981 MRN: [id number]  Date of admission: 01/13/2016 Delivering MD: Jaynie Collins A   Date of discharge: 01/15/2016  Admitting diagnosis: 33 WK ACTIVE LABOR Intrauterine pregnancy: [redacted]w[redacted]d     Secondary diagnosis:  Principal Problem:   Active preterm labor Active Problems:   Preterm delivery, delivered     Discharge diagnosis: Preterm Pregnancy Delivered, Preeclampsia (mild) and Anemia                                                                                                Complications: None  Hospital course:  Onset of Labor With Vaginal Delivery     35 y.o. yo [id number] at [redacted]w[redacted]d was admitted in preterm Active Labor on 01/13/2016. Patient had an uncomplicated labor course as follows:  Membrane Rupture Time/Date: 10:30 AM ,01/13/2016   Intrapartum Procedures: Episiotomy: None [1]                                         Lacerations:  None [1]  Patient had a delivery of a Viable infant. 01/13/2016  Information for the patient's newborn:  Pinkie, Manger [[id number]]  Delivery Method: Vaginal, Spontaneous Delivery (Filed from Delivery Summary)    Pateint had an uncomplicated postpartum course.  Pt c/o spots in her eyes. Her BP's are stable and in normal range and she has no other s/sx. She is ambulating, tolerating a regular diet, passing flatus, and urinating well. Patient is discharged home in stable condition on 01/15/2016.    Physical exam  Filed Vitals:   01/14/16 1304 01/14/16 1710 01/14/16 2131 01/15/16 0600  BP: 150/91 147/81 123/69 135/80  Pulse: 100 99 88 90  Temp: 98.3 F (36.8 C) 98.5 F (36.9 C) 98 F (36.7 C) 98 F (36.7 C)  TempSrc: Oral Oral Oral Oral  Resp: Height:      Weight:      SpO2: 100% 100% 100% 98%   General: alert, cooperative and no distress Lochia: appropriate Uterine Fundus: firm Incision: N/A DVT Evaluation: No evidence of DVT seen on  physical exam. Labs: Lab Results  Component Value Date   WBC 13.3* 01/13/2016   HGB 10.2* 01/13/2016   HCT 31.5* 01/13/2016   MCV 77.2* 01/13/2016   PLT 288 01/13/2016   CMP Latest Ref Rng 01/13/2016  Glucose 65 - 99 mg/dL 130(Q)  BUN 6 - 20 mg/dL 7  Creatinine 6.57 - 8.46 mg/dL 9.62  Sodium 952 - 841 mmol/L 138  Potassium 3.5 - 5.1 mmol/L 3.6  Chloride 101 - 111 mmol/L 106  CO2 22 - 32 mmol/L 20(L)  Calcium 8.9 - 10.3 mg/dL 3.2(G)  Total Protein 6.5 - 8.1 g/dL 6.7  Total Bilirubin 0.3 - 1.2 mg/dL 0.3  Alkaline Phos 38 - 126 U/L 100  AST 15 - 41 U/L 54(H)  ALT 14 - 54 U/L 31    Discharge instruction: per After Visit Summary and "Baby and  Me Booklet".  After visit meds:    Medication List    STOP taking these medications        aspirin EC 81 MG tablet     calcium carbonate 500 MG chewable tablet  Commonly known as:  TUMS - dosed in mg elemental calcium     NIFEdipine 30 MG 24 hr tablet  Commonly known as:  PROCARDIA-XL/ADALAT CC      TAKE these medications        butalbital-acetaminophen-caffeine 50-325-40 MG tablet  Commonly known as:  FIORICET  Take 1-2 tablets by mouth every 6 (six) hours as needed for headache.     ibuprofen 600 MG tablet  Commonly known as:  ADVIL,MOTRIN  Take 1 tablet (600 mg total) by mouth every 6 (six) hours.     prenatal vitamin w/FE, FA 27-1 MG Tabs tablet  Take 1 tablet by mouth daily at 12 noon.        Diet: routine diet  Activity: Advance as tolerated. Pelvic rest for 6 weeks.   Outpatient follow up:2 weeks  Postpartum contraception: interval tubal planned at FT  Newborn Data: Live born female  Birth Weight: 5 lb 1.5 oz (2310 g) APGAR: 6, 8  Baby Feeding: Bottle Disposition:NICU   01/15/2016 Willodean Rosenthal, MD

## 2016-01-15 NOTE — Discharge Instructions (Signed)
Breast Pumping Tips °If you are breastfeeding, there may be times when you cannot feed your baby directly. Returning to work or going on a trip are common examples. Pumping allows you to store breast milk and feed it to your baby later.  °You may not get much milk when you first start to pump. Your breasts should start to make more after a few days. If you pump at the times you usually feed your baby, you may be able to keep making enough milk to feed your baby without also using formula. The more often you pump, the more milk you will produce.  °WHEN SHOULD I PUMP?  °· You can begin to pump soon after delivery. However, some experts recommend waiting about 4 weeks before giving your infant a bottle to make sure breastfeeding is going well.  °· If you plan to return to work, begin pumping a few weeks before. This will help you develop techniques that work best for you. It also lets you build up a supply of breast milk.   °· When you are with your infant, feed on demand and pump after each feeding.   °· When you are away from your infant for several hours, pump for about 15 minutes every 2-3 hours. Pump both breasts at the same time if you can.   °· If your infant has a formula feeding, make sure to pump around the same time.     °· If you drink any alcohol, wait 2 hours before pumping.   °HOW DO I PREPARE TO PUMP? °Your let-down reflex is the natural reaction to stimulation that makes your breast milk flow. It is easier to stimulate this reflex when you are relaxed. Find relaxation techniques that work for you. If you have difficulty with your let-down reflex, try these methods:  °· Smell one of your infant's blankets or an item of clothing.   °· Look at a picture or video of your infant.   °· Sit in a quiet, private space.   °· Massage the breast you plan to pump.   °· Place soothing warmth on the breast.   °· Play relaxing music.   °WHAT ARE SOME GENERAL BREAST PUMPING TIPS? °· Wash your hands before you pump. You  do not need to wash your nipples or breasts. °· There are three ways to pump. °¨ You can use your hand to massage and compress your breast. °¨ You can use a handheld manual pump. °¨ You can use an electric pump.   °· Make sure the suction cup (flange) on the breast pump is the right size. Place the flange directly over the nipple. If it is the wrong size or placed the wrong way, it may be painful and cause nipple damage.   °· If pumping is uncomfortable, apply a small amount of purified or modified lanolin to your nipple and areola. °· If you are using an electric pump, adjust the speed and suction power to be more comfortable. °· If pumping is painful or if you are not getting very much milk, you may need a different type of pump. A lactation consultant can help you determine what type of pump to use.   °· Keep a full water bottle near you at all times. Drinking lots of fluid helps you make more milk.  °· You can store your milk to use later. Pumped breast milk can be stored in a sealable, sterile container or plastic bag. Label all stored breast milk with the date you pumped it. °¨ Milk can stay out at room temperature for up to 8 hours. °¨   You can store your milk in the refrigerator for up to 8 days. °¨ You can store your milk in the freezer for 3 months. Thaw frozen milk using warm water. Do not put it in the microwave. °· Do not smoke. Smoking can lower your milk supply and harm your infant. If you need help quitting, ask your health care provider to recommend a program.   °WHEN SHOULD I CALL MY HEALTH CARE PROVIDER OR A LACTATION CONSULTANT? °· You are having trouble pumping. °· You are concerned that you are not making enough milk. °· You have nipple pain, soreness, or redness. °· You want to use birth control. Birth control pills may lower your milk supply. Talk to your health care provider about your options. °  °This information is not intended to replace advice given to you by your health care provider.  Make sure you discuss any questions you have with your health care provider. °  °Document Released: 05/29/2010 Document Revised: 12/14/2013 Document Reviewed: 10/01/2013 °Elsevier Interactive Patient Education ©2016 Elsevier Inc. ° °

## 2016-01-15 NOTE — Progress Notes (Addendum)
Pt verbalizes understanding of d/c instructions, medications, follow up appts, medications, when to seek medical attention, including postpartum depression- baby blues lasting longer than normal or to the point that she feels lack of connection with baby or thoughts of harming herself or others, also reviewed belongings policy. Pt has no questions at this time. Pt has no IV at this time. Pt is waiting to go to rounds in NICU this afternoon before leaving. Sheryn Bison

## 2016-01-15 NOTE — Progress Notes (Signed)
Pt d/c. She came to desk stating that she was taking her belongings to her car and that she was planning to go, per NT. NT escorted pt and her husband to their car with belongings. [name]

## 2016-01-16 ENCOUNTER — Other Ambulatory Visit: Payer: Self-pay | Admitting: Advanced Practice Midwife

## 2016-01-16 ENCOUNTER — Encounter: Payer: Self-pay | Admitting: Advanced Practice Midwife

## 2016-01-16 DIAGNOSIS — F1411 Cocaine abuse, in remission: Secondary | ICD-10-CM | POA: Insufficient documentation

## 2016-01-16 DIAGNOSIS — F1490 Cocaine use, unspecified, uncomplicated: Secondary | ICD-10-CM | POA: Insufficient documentation

## 2016-01-16 NOTE — Telephone Encounter (Signed)
I did not say this.  Pt will need referral to psychiatrist for antianxiety medication.  Let me know if she wants one to [facility] in [facility]

## 2016-01-17 ENCOUNTER — Telehealth: Payer: Self-pay | Admitting: Women's Health

## 2016-01-17 NOTE — Telephone Encounter (Signed)
Pt c/o hemorrhoids since delivery, states OTC meds not helping. Pt given an appt for tomorrow for evaluation.

## 2016-01-17 NOTE — Telephone Encounter (Signed)
Pt has an appt for tomorrow for evaluation of hemorrhoids post delivery. Informed pt will needs to discuss with Drenda Freeze antianxiety meds at this appt.

## 2016-01-18 ENCOUNTER — Other Ambulatory Visit: Payer: Medicaid Other | Admitting: Obstetrics & Gynecology

## 2016-01-18 ENCOUNTER — Ambulatory Visit (INDEPENDENT_AMBULATORY_CARE_PROVIDER_SITE_OTHER): Payer: Medicaid Other | Admitting: Advanced Practice Midwife

## 2016-01-18 ENCOUNTER — Other Ambulatory Visit: Payer: Medicaid Other

## 2016-01-18 ENCOUNTER — Other Ambulatory Visit: Payer: Self-pay | Admitting: Obstetrics and Gynecology

## 2016-01-18 ENCOUNTER — Encounter: Payer: Self-pay | Admitting: Advanced Practice Midwife

## 2016-01-18 ENCOUNTER — Encounter: Payer: Medicaid Other | Admitting: Advanced Practice Midwife

## 2016-01-18 VITALS — BP 170/96 | HR 96 | Ht 63.0 in | Wt 174.5 lb

## 2016-01-18 DIAGNOSIS — K645 Perianal venous thrombosis: Secondary | ICD-10-CM

## 2016-01-18 MED ORDER — HYDROCORTISONE ACE-PRAMOXINE 1-1 % RE CREA: 1.0000 "application " | TOPICAL_CREAM | Freq: Two times a day (BID) | RECTAL | Status: DC

## 2016-01-18 NOTE — Progress Notes (Signed)
 [facility] Visit  Patient name: [name] MRN [id number]  Date of birth: 08/30/1981  CC & HPI:  [name] is a 34 y.o. Hispanic female presenting today for hemorrhoids. She had a SVD 5 days ago.  Has not had a BM, takes stool softeners. Tries to push them back up into her rectum, but they pop right out. Cannot sit down. They bleed sometimes.  Has tried OTC creams, sitz baths, ice.   Had mild preeclampsia, was not dc'd home on any antihypertensives  Had some initial pp anxiety, but now baby is getting better and her anxiety is decreasing.     Baby had cocaine in its urine after birth.  States she "touched it with her mouth".   Filed Vitals:   01/18/16 1348  BP: 170/96  Pulse: 96    158/98   Pertinent History Reviewed:  Medical & Surgical Hx:   Past Medical History  Diagnosis Date  . Ovarian cyst   . Kidney stones   . Pregnant 08/09/2015  . Pregnancy induced hypertension    Past Surgical History  Procedure Laterality Date  . Kidney stents    . Lithotripsy    . Appendectomy     Family History  Problem Relation Age of Onset  . Cancer Mother     breast  . Diabetes Maternal Grandmother     Current outpatient prescriptions:  .  acetaminophen (TYLENOL) 325 MG tablet, Take 650 mg by mouth every 6 (six) hours as needed., Disp: , Rfl:  .  pramoxine-hydrocortisone (PROCTOCREAM-HC) 1-1 % rectal cream, Place 1 application rectally 2 (two) times daily., Disp: 30 g, Rfl: 1 Social History: Reviewed -  reports that she has never smoked. She has never used smokeless tobacco.  Review of Systems:   Constitutional: Negative for fever and chills Eyes: Negative for visual disturbances Respiratory: Negative for shortness of breath, dyspnea Cardiovascular: Negative for chest pain or palpitations  Gastrointestinal: Negative for vomiting, diarrhea and constipation; no abdominal pain Genitourinary: Negative for dysuria and urgency, vaginal irritation  or itching Musculoskeletal: Negative for back pain, joint pain, myalgias  Neurological: Negative for dizziness and headaches    Objective Findings:    Physical Examination: General appearance - well appearing, and in no distress Mental status - alert, oriented to person, place, and time Chest:  Normal respiratory effort Heart - normal rate and regular rhythm Abdomen:  Soft, tender right of umbilicus.   Pelvic: golf ball sized external hemorrhoids, 2 of the deeper ones appear to be thrombosed.  Co exam with Dr. Jenesis Suchy, with pt unable to tolerate exam sufficient to allow consideration of office I&D , even under cryo. Musculoskeletal:  Normal range of motion without pain Extremities:  No edema    No results found for this or any previous visit (from the past 24 hour(s)).    Assessment & Plan:  A:   Thrombosed hemorrhoids            Postpartum residual gestational hypertension P:  Rx Norvasc 5mg daily  Dr. Erron Wengert arranging consult for surgery    Return in about 3 weeks (around 02/08/2016), or postpartum checkup.TO DC norvasc 2 days beofre appt  CRESENZO-DISHMAN,FRANCES CNM 01/18/2016 1:57 PM  Patient counselled over proposed I & D of thrombosed external hemorrhoids, with arrangement made for surgery in a.m.  Incision and drainage reviewed, with patient, who prefers tomorrow morning surgery , as she is to visit her Infant in Neonatal ICU today. Surgery scheduled for 11:30   tomorrow.     

## 2016-01-18 NOTE — Patient Instructions (Signed)

## 2016-01-19 ENCOUNTER — Ambulatory Visit (HOSPITAL_COMMUNITY): Payer: Medicaid Other | Admitting: Anesthesiology

## 2016-01-19 ENCOUNTER — Encounter (HOSPITAL_COMMUNITY)
Admission: AD | Disposition: A | Discharge status: Home / Self Care | Payer: Self-pay | Source: Ambulatory Visit | Attending: General Surgery

## 2016-01-19 ENCOUNTER — Encounter (HOSPITAL_COMMUNITY): Payer: Self-pay | Admitting: *Deleted

## 2016-01-19 ENCOUNTER — Ambulatory Visit (HOSPITAL_COMMUNITY)
Admission: AD | Admit: 2016-01-19 | Discharge: 2016-01-19 | Disposition: A | Discharge status: Home / Self Care | Payer: Medicaid Other | Source: Ambulatory Visit | Attending: General Surgery | Admitting: General Surgery

## 2016-01-19 ENCOUNTER — Ambulatory Visit (HOSPITAL_COMMUNITY)
Admission: RE | Admit: 2016-01-19 | Payer: Medicaid Other | Source: Ambulatory Visit | Attending: Obstetrics and Gynecology | Admitting: Obstetrics and Gynecology

## 2016-01-19 DIAGNOSIS — K648 Other hemorrhoids: Secondary | ICD-10-CM | POA: Insufficient documentation

## 2016-01-19 HISTORY — PX: HEMORRHOID SURGERY: SHX153

## 2016-01-19 LAB — CBC
HEMATOCRIT: 33.2 % — AB (ref 36.0–46.0)
Hemoglobin: 10.3 g/dL — ABNORMAL LOW (ref 12.0–15.0)
MCH: 24.9 pg — AB (ref 26.0–34.0)
MCHC: 31 g/dL (ref 30.0–36.0)
MCV: 80.2 fL (ref 78.0–100.0)
Platelets: 337 10*3/uL (ref 150–400)
RBC: 4.14 MIL/uL (ref 3.87–5.11)
RDW: 16.7 % — AB (ref 11.5–15.5)
WBC: 9.4 10*3/uL (ref 4.0–10.5)

## 2016-01-19 LAB — GLUCOSE, CAPILLARY: Glucose-Capillary: 86 mg/dL (ref 65–99)

## 2016-01-19 SURGERY — HEMORRHOIDECTOMY
Anesthesia: General

## 2016-01-19 SURGERY — INCISION AND DRAINAGE, ABSCESS
Anesthesia: Choice

## 2016-01-19 MED ORDER — LIDOCAINE VISCOUS 2 % MT SOLN
OROMUCOSAL | Status: DC | PRN
  Administered 2016-01-19: 1 via OROMUCOSAL

## 2016-01-19 MED ORDER — ONDANSETRON HCL 4 MG/2ML IJ SOLN
INTRAMUSCULAR | Status: AC
Start: 2016-01-19 — End: 2016-01-19
  Filled 2016-01-19: qty 2

## 2016-01-19 MED ORDER — FENTANYL CITRATE (PF) 100 MCG/2ML IJ SOLN
INTRAMUSCULAR | Status: DC | PRN
  Administered 2016-01-19: 100 ug via INTRAVENOUS
  Administered 2016-01-19 (×4): 50 ug via INTRAVENOUS

## 2016-01-19 MED ORDER — ONDANSETRON HCL 4 MG/2ML IJ SOLN
4.0000 mg | Freq: Once | INTRAMUSCULAR | Status: AC
  Administered 2016-01-19: 4 mg via INTRAVENOUS

## 2016-01-19 MED ORDER — LIDOCAINE HCL (CARDIAC) 20 MG/ML IV SOLN
INTRAVENOUS | Status: DC | PRN
  Administered 2016-01-19: 50 mg via INTRAVENOUS

## 2016-01-19 MED ORDER — BUPIVACAINE HCL (PF) 0.5 % IJ SOLN
INTRAMUSCULAR | Status: AC
  Filled 2016-01-19: qty 30

## 2016-01-19 MED ORDER — OXYCODONE-ACETAMINOPHEN 7.5-325 MG PO TABS: 1.0000 | ORAL_TABLET | ORAL | Status: DC | PRN

## 2016-01-19 MED ORDER — FENTANYL CITRATE (PF) 100 MCG/2ML IJ SOLN
25.0000 ug | INTRAMUSCULAR | Status: AC
Start: 2016-01-19 — End: 2016-01-19

## 2016-01-19 MED ORDER — MIDAZOLAM HCL 2 MG/2ML IJ SOLN
INTRAMUSCULAR | Status: AC
  Filled 2016-01-19: qty 2

## 2016-01-19 MED ORDER — ONDANSETRON HCL 4 MG/2ML IJ SOLN: 4.0000 mg | Freq: Once | INTRAMUSCULAR | Status: DC | PRN

## 2016-01-19 MED ORDER — CEFAZOLIN SODIUM-DEXTROSE 2-3 GM-% IV SOLR
2.0000 g | INTRAVENOUS | Status: AC
  Administered 2016-01-19: 2 g via INTRAVENOUS

## 2016-01-19 MED ORDER — FENTANYL CITRATE (PF) 250 MCG/5ML IJ SOLN
INTRAMUSCULAR | Status: AC
  Filled 2016-01-19: qty 5

## 2016-01-19 MED ORDER — MIDAZOLAM HCL 5 MG/5ML IJ SOLN
INTRAMUSCULAR | Status: DC | PRN
  Administered 2016-01-19: 2 mg via INTRAVENOUS

## 2016-01-19 MED ORDER — PROPOFOL 10 MG/ML IV BOLUS
INTRAVENOUS | Status: AC
  Filled 2016-01-19: qty 20

## 2016-01-19 MED ORDER — PROPOFOL 10 MG/ML IV BOLUS
INTRAVENOUS | Status: DC | PRN
  Administered 2016-01-19: 150 mg via INTRAVENOUS

## 2016-01-19 MED ORDER — FENTANYL CITRATE (PF) 100 MCG/2ML IJ SOLN
25.0000 ug | Freq: Once | INTRAMUSCULAR | Status: AC
  Administered 2016-01-19: 25 ug via INTRAVENOUS

## 2016-01-19 MED ORDER — LACTATED RINGERS IV SOLN
INTRAVENOUS | Status: DC
  Administered 2016-01-19: 12:00:00 via INTRAVENOUS

## 2016-01-19 MED ORDER — LACTATED RINGERS IV SOLN
INTRAVENOUS | Status: DC | PRN
  Administered 2016-01-19 (×2): via INTRAVENOUS

## 2016-01-19 MED ORDER — LIDOCAINE VISCOUS 2 % MT SOLN
OROMUCOSAL | Status: AC
  Filled 2016-01-19: qty 15

## 2016-01-19 MED ORDER — BUPIVACAINE HCL (PF) 0.5 % IJ SOLN
INTRAMUSCULAR | Status: DC | PRN
  Administered 2016-01-19: 10 mL

## 2016-01-19 MED ORDER — SUCCINYLCHOLINE CHLORIDE 20 MG/ML IJ SOLN
INTRAMUSCULAR | Status: DC | PRN
  Administered 2016-01-19: 120 mg via INTRAVENOUS

## 2016-01-19 MED ORDER — LIDOCAINE HCL (PF) 1 % IJ SOLN
INTRAMUSCULAR | Status: AC
  Filled 2016-01-19: qty 10

## 2016-01-19 MED ORDER — FENTANYL CITRATE (PF) 100 MCG/2ML IJ SOLN
INTRAMUSCULAR | Status: AC
  Filled 2016-01-19: qty 2

## 2016-01-19 MED ORDER — FENTANYL CITRATE (PF) 100 MCG/2ML IJ SOLN
25.0000 ug | Freq: Once | INTRAMUSCULAR | Status: AC
Start: 2016-01-19 — End: 2016-01-19
  Administered 2016-01-19: 25 ug via INTRAVENOUS

## 2016-01-19 MED ORDER — SODIUM CHLORIDE 0.9 % IR SOLN
Status: DC | PRN
  Administered 2016-01-19: 300 mL

## 2016-01-19 MED ORDER — FENTANYL CITRATE (PF) 100 MCG/2ML IJ SOLN
25.0000 ug | INTRAMUSCULAR | Status: DC | PRN
  Administered 2016-01-19 (×2): 50 ug via INTRAVENOUS
  Filled 2016-01-19: qty 2

## 2016-01-19 MED ORDER — CEFAZOLIN SODIUM-DEXTROSE 2-3 GM-% IV SOLR
INTRAVENOUS | Status: AC
  Filled 2016-01-19: qty 50

## 2016-01-19 MED ORDER — KETOROLAC TROMETHAMINE 30 MG/ML IJ SOLN
30.0000 mg | Freq: Once | INTRAMUSCULAR | Status: AC
  Administered 2016-01-19: 30 mg via INTRAVENOUS
  Filled 2016-01-19: qty 1

## 2016-01-19 MED ORDER — MIDAZOLAM HCL 2 MG/2ML IJ SOLN
1.0000 mg | INTRAMUSCULAR | Status: DC | PRN
  Administered 2016-01-19: 2 mg via INTRAVENOUS

## 2016-01-19 SURGICAL SUPPLY — 31 items
BAG HAMPER (MISCELLANEOUS) ×3 IMPLANT
CLOTH BEACON ORANGE TIMEOUT ST (SAFETY) ×3 IMPLANT
COVER LIGHT HANDLE STERIS (MISCELLANEOUS) ×6 IMPLANT
COVER MAYO STAND XLG (DRAPE) ×3 IMPLANT
DECANTER SPIKE VIAL GLASS SM (MISCELLANEOUS) ×3 IMPLANT
DRAPE PROXIMA HALF (DRAPES) ×3 IMPLANT
ELECT REM PT RETURN 9FT ADLT (ELECTROSURGICAL) ×3
ELECTRODE REM PT RTRN 9FT ADLT (ELECTROSURGICAL) ×1 IMPLANT
FORMALIN 10 PREFIL 120ML (MISCELLANEOUS) ×3 IMPLANT
GAUZE SPONGE 4X4 12PLY STRL (GAUZE/BANDAGES/DRESSINGS) ×3 IMPLANT
GLOVE BIOGEL PI IND STRL 7.0 (GLOVE) ×1 IMPLANT
GLOVE BIOGEL PI INDICATOR 7.0 (GLOVE) ×2
GLOVE SURG SS PI 7.5 STRL IVOR (GLOVE) ×6 IMPLANT
GOWN STRL REUS W/ TWL XL LVL3 (GOWN DISPOSABLE) ×1 IMPLANT
GOWN STRL REUS W/TWL LRG LVL3 (GOWN DISPOSABLE) ×3 IMPLANT
GOWN STRL REUS W/TWL XL LVL3 (GOWN DISPOSABLE) ×3
HEMOSTAT SURGICEL 4X8 (HEMOSTASIS) ×3 IMPLANT
KIT ROOM TURNOVER AP CYSTO (KITS) ×3 IMPLANT
LIGASURE IMPACT 36 18CM CVD LR (INSTRUMENTS) IMPLANT
MANIFOLD NEPTUNE II (INSTRUMENTS) ×3 IMPLANT
NDL HYPO 25X1 1.5 SAFETY (NEEDLE) ×1 IMPLANT
NEEDLE HYPO 25X1 1.5 SAFETY (NEEDLE) ×3 IMPLANT
NS IRRIG 1000ML POUR BTL (IV SOLUTION) ×3 IMPLANT
PACK PERI GYN (CUSTOM PROCEDURE TRAY) ×3 IMPLANT
PAD ARMBOARD 7.5X6 YLW CONV (MISCELLANEOUS) ×3 IMPLANT
SET BASIN LINEN APH (SET/KITS/TRAYS/PACK) ×3 IMPLANT
SURGILUBE 3G PEEL PACK STRL (MISCELLANEOUS) ×3 IMPLANT
SUT CHROMIC 3 0 SH 27 (SUTURE) ×2 IMPLANT
SUT SILK 0 FSL (SUTURE) ×3 IMPLANT
SUT VIC AB 2-0 CT2 27 (SUTURE) IMPLANT
SYRINGE 10CC LL (SYRINGE) ×3 IMPLANT

## 2016-01-19 NOTE — H&P (View-Only) (Signed)
[facility] Visit  Patient name: [name] MRN [id number]  Date of birth: May 01, 1981  CC & HPI:  [name] is a 35 y.o. Hispanic female presenting today for hemorrhoids. She had a SVD 5 days ago.  Has not had a BM, takes stool softeners. Tries to push them back up into her rectum, but they pop right out. Cannot sit down. They bleed sometimes.  Has tried OTC creams, sitz baths, ice.   Had mild preeclampsia, was not dc'd home on any antihypertensives  Had some initial pp anxiety, but now baby is getting better and her anxiety is decreasing.     Baby had cocaine in its urine after birth.  States she "touched it with her mouth".   Filed Vitals:   01/18/16 1348  BP: 170/96  Pulse: 96    158/98   Pertinent History Reviewed:  Medical & Surgical Hx:   Past Medical History  Diagnosis Date  . Ovarian cyst   . Kidney stones   . Pregnant 08/09/2015  . Pregnancy induced hypertension    Past Surgical History  Procedure Laterality Date  . Kidney stents    . Lithotripsy    . Appendectomy     Family History  Problem Relation Age of Onset  . Cancer Mother     breast  . Diabetes Maternal Grandmother     Current outpatient prescriptions:  .  acetaminophen (TYLENOL) 325 MG tablet, Take 650 mg by mouth every 6 (six) hours as needed., Disp: , Rfl:  .  pramoxine-hydrocortisone (PROCTOCREAM-HC) 1-1 % rectal cream, Place 1 application rectally 2 (two) times daily., Disp: 30 g, Rfl: 1 Social History: Reviewed -  reports that she has never smoked. She has never used smokeless tobacco.  Review of Systems:   Constitutional: Negative for fever and chills Eyes: Negative for visual disturbances Respiratory: Negative for shortness of breath, dyspnea Cardiovascular: Negative for chest pain or palpitations  Gastrointestinal: Negative for vomiting, diarrhea and constipation; no abdominal pain Genitourinary: Negative for dysuria and urgency, vaginal irritation  or itching Musculoskeletal: Negative for back pain, joint pain, myalgias  Neurological: Negative for dizziness and headaches    Objective Findings:    Physical Examination: General appearance - well appearing, and in no distress Mental status - alert, oriented to person, place, and time Chest:  Normal respiratory effort Heart - normal rate and regular rhythm Abdomen:  Soft, tender right of umbilicus.   Pelvic: golf ball sized external hemorrhoids, 2 of the deeper ones appear to be thrombosed.  Co exam with Dr. Emelda Fear, with pt unable to tolerate exam sufficient to allow consideration of office I&D , even under cryo. Musculoskeletal:  Normal range of motion without pain Extremities:  No edema    No results found for this or any previous visit (from the past 24 hour(s)).    Assessment & Plan:  A:   Thrombosed hemorrhoids            Postpartum residual gestational hypertension P:  Rx Norvasc  daily  Dr. Emelda Fear arranging consult for surgery    Return in about 3 weeks (around 02/08/2016), or postpartum checkup.TO DC norvasc 2 days beofre appt  CRESENZO-DISHMAN,FRANCES CNM 01/18/2016 1:57 PM  Patient counselled over proposed I & D of thrombosed external hemorrhoids, with arrangement made for surgery in a.m.  Incision and drainage reviewed, with patient, who prefers tomorrow morning surgery , as she is to visit her Infant in Neonatal ICU today. Surgery scheduled for 11:30  tomorrow.

## 2016-01-19 NOTE — Anesthesia Postprocedure Evaluation (Signed)
Anesthesia Post Note  Patient: [name]  Procedure(s) Performed: Procedure(s) (LRB): EXTENSIVE HEMORRHOIDECTOMY (N/A)  Patient location during evaluation: PACU Anesthesia Type: General Level of consciousness: awake and alert and patient cooperative Pain management: pain level controlled Vital Signs Assessment: post-procedure vital signs reviewed and stable Respiratory status: nonlabored ventilation and patient connected to face mask oxygen Cardiovascular status: blood pressure returned to baseline Postop Assessment: no signs of nausea or vomiting Anesthetic complications: no    Last Vitals:  Filed Vitals:   01/19/16 1215 01/19/16 1326  BP: 135/72 155/87  Pulse:    Temp:  37.2 C  Resp: 27     Last Pain:  Filed Vitals:   01/19/16 1328  PainSc: 6                  Kento Gossman J

## 2016-01-19 NOTE — Op Note (Signed)
Patient:  [name]  DOB:  1981/10/02  MRN:  [id number]   Preop Diagnosis:  Thrombosed and prolapsing hemorrhoidal disease  Postop Diagnosis:  Same  Procedure:  Extensive hemorrhoidectomy  Surgeon:  Franky Macho, M.D.  Anes:  Gen.  Indications:  Patient is a 35 year old female who recently underwent vaginal delivery of her baby. She has postpartum 6 days. She started developing significant rectal pain. She was seen by Dr. Emelda Fear in his office and was noted have significant hemorrhoidal disease. The patient now comes the operating room for extensive hemorrhoidectomy. The risks and benefits of the procedure including bleeding, infection, and the possibility of needing further surgery given the extensiveness of the hemorrhoidal disease was fully explained to the patient, who gave informed consent.  Procedure note:  The patient was placed in the lithotomy position after general anesthesia was administered. The perineum was prepped and draped using usual sterile technique with Betadine. Surgical site confirmation was performed.  On examination, she had significant prolapsing hemorrhoidal tissue as well as mucosa circumferentially, though the thrombotic in mildly necrotic area was noted along the lower half of the anus from the 3:00 position to the 10:00 position. This was all excised to its base circumferentially using the LigaSure. Care was taken to avoid the external sphincter mechanism. There was still hemorrhoidal tissue mucosal prolapsing anteriorly, though this was not thrombotic or necrotic. A small area at the 4:00 position outside the anal verge was noted to have superficial skin separation. It was brought back together using interrupted 3-0 chromic gut sutures. Again, the external sphincter mechanism was noted to be intact. 0.5% Sensorcaine was instilled into the surrounding wound. Surgicel and Viscous Xylocaine rectal packing was then placed.  All tape and needle counts  were correct at the end of the procedure. Patient was awakened and transferred to PACU in stable condition.  Complications:  None  EBL:  Minimal  Specimen:  Hemorrhoids

## 2016-01-19 NOTE — Transfer of Care (Signed)
Immediate Anesthesia Transfer of Care Note  Patient: [name]  Procedure(s) Performed: Procedure(s): EXTENSIVE HEMORRHOIDECTOMY (N/A)  Patient Location: PACU  Anesthesia Type:General  Level of Consciousness: awake, alert , oriented and patient cooperative  Airway & Oxygen Therapy: Patient Spontanous Breathing and Patient connected to face mask oxygen  Post-op Assessment: Report given to RN, Post -op Vital signs reviewed and stable and Patient moving all extremities  Post vital signs: Reviewed and stable  Last Vitals:  Filed Vitals:   01/19/16 1210 01/19/16 1215  BP: 137/83 135/72  Pulse:    Temp:    Resp: 70 27    Complications: No apparent anesthesia complications

## 2016-01-19 NOTE — Anesthesia Preprocedure Evaluation (Signed)
Anesthesia Evaluation  Patient identified by MRN, date of birth, ID band Patient awake    Reviewed: Allergy & Precautions, NPO status , Patient's Chart, lab work & pertinent test results  Airway Mallampati: I  TM Distance: >3 FB     Dental  (+) Dental Advisory Given, Teeth Intact   Pulmonary neg pulmonary ROS,    breath sounds clear to auscultation       Cardiovascular hypertension (Hx PIH - off meds now),  Rhythm:Regular Rate:Normal     Neuro/Psych negative neurological ROS     GI/Hepatic neg GERD  ,  Endo/Other    Renal/GU      Musculoskeletal   Abdominal   Peds  Hematology   Anesthesia Other Findings Post Partum  Reproductive/Obstetrics                             Anesthesia Physical Anesthesia Plan  ASA: II  Anesthesia Plan: General   Post-op Pain Management:    Induction: Intravenous, Rapid sequence and Cricoid pressure planned  Airway Management Planned: Oral ETT  Additional Equipment:   Intra-op Plan:   Post-operative Plan: Extubation in OR  Informed Consent: I have reviewed the patients History and Physical, chart, labs and discussed the procedure including the risks, benefits and alternatives for the proposed anesthesia with the patient or authorized representative who has indicated his/her understanding and acceptance.     Plan Discussed with:   Anesthesia Plan Comments:         Anesthesia Quick Evaluation

## 2016-01-19 NOTE — Interval H&P Note (Signed)
History and Physical Interval Note: Patient seen, chart reviewed. We will proceed with hemorrhoidectomy. Risks and benefits of the procedure including bleeding, infection, and recurrence of hemorrhoidal disease were fully explained to the patient, who gave informed consent. 01/19/2016 11:44 AM  [name]  has presented today for surgery, with the diagnosis of thrombosed external hemorrhoids  The various methods of treatment have been discussed with the patient and family. After consideration of risks, benefits and other options for treatment, the patient has consented to  Procedure(s): HEMORRHOIDECTOMY (N/A) as a surgical intervention .  The patient's history has been reviewed, patient examined, no change in status, stable for surgery.  I have reviewed the patient's chart and labs.  Questions were answered to the patient's satisfaction.     Franky Macho A

## 2016-01-19 NOTE — Anesthesia Postprocedure Evaluation (Signed)
Anesthesia Post Note Late Entry  Patient: [name]  Procedure(s) Performed: Procedure(s) (LRB): EXTENSIVE HEMORRHOIDECTOMY (N/A)  Patient location during evaluation: PACU Anesthesia Type: General Level of consciousness: awake and alert and oriented Pain management: pain level controlled Vital Signs Assessment: post-procedure vital signs reviewed and stable Respiratory status: respiratory function stable and spontaneous breathing Cardiovascular status: stable Postop Assessment: no signs of nausea or vomiting Anesthetic complications: no    Last Vitals:  Filed Vitals:   01/19/16 1415 01/19/16 1436  BP: 161/63 158/86  Pulse:  82  Temp:  36.6 C  Resp:  20    Last Pain:  Filed Vitals:   01/19/16 1438  PainSc: 2                  ADAMS, AMY A

## 2016-01-19 NOTE — Discharge Instructions (Signed)
Surgical Procedures for Hemorrhoids, Care After °Refer to this sheet in the next few weeks. These instructions provide you with information about caring for yourself after your procedure. Your health care provider may also give you more specific instructions. Your treatment has been planned according to current medical practices, but problems sometimes occur. Call your health care provider if you have any problems or questions after your procedure. °WHAT TO EXPECT AFTER THE PROCEDURE °After your procedure, it is common to have: °· Rectal pain. °· Pain when you are having a bowel movement. °· Slight rectal bleeding. °HOME CARE INSTRUCTIONS °Medicines °· Take over-the-counter and prescription medicines only as told by your health care provider. °· Do not drive or operate heavy machinery while taking prescription pain medicine. °· Use a stool softener or a bulk laxative as told by your health care provider. °Activities °· Rest at home. Return to your normal activities as told by your health care provider. °· Do not lift anything that is heavier than 10 lb (4.5 kg). °· Do not sit for long periods of time. Take a walk every day or as told by your health care provider. °· Do not strain to have a bowel movement. Do not spend a long time sitting on the toilet. °Eating and Drinking °· Eat foods that contain fiber, such as whole grains, beans, nuts, fruits, and vegetables. °· Drink enough fluid to keep your urine clear or pale yellow. °General Instructions °· Sit in a warm bath 2-3 times per day to relieve soreness or itching. °· Keep all follow-up visits as told by your health care provider. This is important. °SEEK MEDICAL CARE IF: °· Your pain medicine is not helping. °· You have a fever or chills. °· You become constipated. °· You have trouble passing urine. °SEEK IMMEDIATE MEDICAL CARE IF: °· You have very bad rectal pain. °· You have heavy bleeding from your rectum. °  °This information is not intended to replace advice  given to you by your health care provider. Make sure you discuss any questions you have with your health care provider. °  °Document Released: 02/29/2004 Document Revised: 08/30/2015 Document Reviewed: 03/06/2015 °Elsevier Interactive Patient Education ©2016 Elsevier Inc. ° °

## 2016-01-19 NOTE — Anesthesia Procedure Notes (Signed)
Procedure Name: Intubation Date/Time: 01/19/2016 12:33 PM Performed by: Pernell Dupre, AMY A Pre-anesthesia Checklist: Patient identified, Patient being monitored, Timeout performed, Emergency Drugs available and Suction available Patient Re-evaluated:Patient Re-evaluated prior to inductionOxygen Delivery Method: Circle System Utilized Preoxygenation: Pre-oxygenation with 100% oxygen Intubation Type: IV induction, Rapid sequence and Cricoid Pressure applied Laryngoscope Size: 3 and Miller Grade View: Grade I Tube type: Oral Tube size: 7.0 mm Number of attempts: 1 Airway Equipment and Method: Stylet Placement Confirmation: ETT inserted through vocal cords under direct vision,  positive ETCO2 and breath sounds checked- equal and bilateral Secured at: 21 cm Tube secured with: Tape Dental Injury: Teeth and Oropharynx as per pre-operative assessment

## 2016-01-22 ENCOUNTER — Encounter: Payer: Medicaid Other | Admitting: Obstetrics & Gynecology

## 2016-01-22 ENCOUNTER — Encounter (HOSPITAL_COMMUNITY): Payer: Self-pay | Admitting: General Surgery

## 2016-01-22 ENCOUNTER — Other Ambulatory Visit: Payer: Medicaid Other

## 2016-01-22 ENCOUNTER — Other Ambulatory Visit: Payer: Medicaid Other | Admitting: Obstetrics & Gynecology

## 2016-01-25 ENCOUNTER — Other Ambulatory Visit: Payer: Medicaid Other

## 2016-01-25 ENCOUNTER — Other Ambulatory Visit: Payer: Medicaid Other | Admitting: Obstetrics & Gynecology

## 2016-01-25 ENCOUNTER — Encounter: Payer: Medicaid Other | Admitting: Obstetrics & Gynecology

## 2016-07-30 ENCOUNTER — Encounter (HOSPITAL_COMMUNITY): Payer: Self-pay | Admitting: *Deleted

## 2016-07-30 ENCOUNTER — Emergency Department (HOSPITAL_COMMUNITY): Payer: Medicaid Other

## 2016-07-30 ENCOUNTER — Observation Stay (HOSPITAL_COMMUNITY)
Admission: EM | Admit: 2016-07-30 | Discharge: 2016-08-01 | Disposition: A | Discharge status: Home / Self Care | Payer: Medicaid Other | Attending: Internal Medicine | Admitting: Internal Medicine

## 2016-07-30 DIAGNOSIS — R531 Weakness: Secondary | ICD-10-CM

## 2016-07-30 DIAGNOSIS — Z87442 Personal history of urinary calculi: Secondary | ICD-10-CM | POA: Diagnosis not present

## 2016-07-30 DIAGNOSIS — E876 Hypokalemia: Secondary | ICD-10-CM | POA: Diagnosis not present

## 2016-07-30 DIAGNOSIS — N12 Tubulo-interstitial nephritis, not specified as acute or chronic: Secondary | ICD-10-CM | POA: Insufficient documentation

## 2016-07-30 DIAGNOSIS — G459 Transient cerebral ischemic attack, unspecified: Secondary | ICD-10-CM | POA: Insufficient documentation

## 2016-07-30 DIAGNOSIS — D649 Anemia, unspecified: Secondary | ICD-10-CM | POA: Insufficient documentation

## 2016-07-30 DIAGNOSIS — R7303 Prediabetes: Secondary | ICD-10-CM | POA: Insufficient documentation

## 2016-07-30 DIAGNOSIS — Z833 Family history of diabetes mellitus: Secondary | ICD-10-CM | POA: Diagnosis not present

## 2016-07-30 DIAGNOSIS — R200 Anesthesia of skin: Secondary | ICD-10-CM

## 2016-07-30 DIAGNOSIS — E785 Hyperlipidemia, unspecified: Secondary | ICD-10-CM | POA: Diagnosis not present

## 2016-07-30 DIAGNOSIS — Z7982 Long term (current) use of aspirin: Secondary | ICD-10-CM | POA: Diagnosis not present

## 2016-07-30 DIAGNOSIS — F1490 Cocaine use, unspecified, uncomplicated: Secondary | ICD-10-CM | POA: Diagnosis present

## 2016-07-30 DIAGNOSIS — N83201 Unspecified ovarian cyst, right side: Secondary | ICD-10-CM | POA: Diagnosis not present

## 2016-07-30 DIAGNOSIS — K645 Perianal venous thrombosis: Secondary | ICD-10-CM | POA: Diagnosis not present

## 2016-07-30 DIAGNOSIS — IMO0001 Reserved for inherently not codable concepts without codable children: Secondary | ICD-10-CM

## 2016-07-30 DIAGNOSIS — Z823 Family history of stroke: Secondary | ICD-10-CM | POA: Diagnosis not present

## 2016-07-30 DIAGNOSIS — Z803 Family history of malignant neoplasm of breast: Secondary | ICD-10-CM | POA: Diagnosis not present

## 2016-07-30 DIAGNOSIS — R030 Elevated blood-pressure reading, without diagnosis of hypertension: Secondary | ICD-10-CM

## 2016-07-30 DIAGNOSIS — I10 Essential (primary) hypertension: Secondary | ICD-10-CM | POA: Diagnosis not present

## 2016-07-30 DIAGNOSIS — F1410 Cocaine abuse, uncomplicated: Secondary | ICD-10-CM | POA: Insufficient documentation

## 2016-07-30 DIAGNOSIS — F1411 Cocaine abuse, in remission: Secondary | ICD-10-CM | POA: Diagnosis present

## 2016-07-30 DIAGNOSIS — R079 Chest pain, unspecified: Secondary | ICD-10-CM | POA: Diagnosis not present

## 2016-07-30 DIAGNOSIS — Z8632 Personal history of gestational diabetes: Secondary | ICD-10-CM | POA: Diagnosis not present

## 2016-07-30 DIAGNOSIS — O09293 Supervision of pregnancy with other poor reproductive or obstetric history, third trimester: Secondary | ICD-10-CM | POA: Diagnosis present

## 2016-07-30 HISTORY — DX: Weakness: R53.1

## 2016-07-30 LAB — URINALYSIS, ROUTINE W REFLEX MICROSCOPIC
BILIRUBIN URINE: NEGATIVE
GLUCOSE, UA: NEGATIVE mg/dL
HGB URINE DIPSTICK: NEGATIVE
KETONES UR: NEGATIVE mg/dL
Leukocytes, UA: NEGATIVE
NITRITE: NEGATIVE
PH: 6.5 (ref 5.0–8.0)
Protein, ur: NEGATIVE mg/dL
Specific Gravity, Urine: 1.01 (ref 1.005–1.030)

## 2016-07-30 LAB — I-STAT CHEM 8, ED
BUN: 4 mg/dL — AB (ref 6–20)
CHLORIDE: 100 mmol/L — AB (ref 101–111)
Calcium, Ion: 1.18 mmol/L (ref 1.13–1.30)
Creatinine, Ser: 0.8 mg/dL (ref 0.44–1.00)
Glucose, Bld: 111 mg/dL — ABNORMAL HIGH (ref 65–99)
HEMATOCRIT: 42 % (ref 36.0–46.0)
Hemoglobin: 14.3 g/dL (ref 12.0–15.0)
Potassium: 3.8 mmol/L (ref 3.5–5.1)
SODIUM: 140 mmol/L (ref 135–145)
TCO2: 26 mmol/L (ref 0–100)

## 2016-07-30 LAB — RAPID URINE DRUG SCREEN, HOSP PERFORMED
AMPHETAMINES: NOT DETECTED
BENZODIAZEPINES: NOT DETECTED
Barbiturates: NOT DETECTED
Cocaine: POSITIVE — AB
OPIATES: NOT DETECTED
TETRAHYDROCANNABINOL: NOT DETECTED

## 2016-07-30 LAB — DIFFERENTIAL
Basophils Absolute: 0 10*3/uL (ref 0.0–0.1)
Basophils Relative: 0 %
Eosinophils Absolute: 0.2 10*3/uL (ref 0.0–0.7)
Eosinophils Relative: 2 %
LYMPHS PCT: 28 %
Lymphs Abs: 2.6 10*3/uL (ref 0.7–4.0)
Monocytes Absolute: 0.5 10*3/uL (ref 0.1–1.0)
Monocytes Relative: 6 %
NEUTROS ABS: 6.1 10*3/uL (ref 1.7–7.7)
NEUTROS PCT: 64 %

## 2016-07-30 LAB — COMPREHENSIVE METABOLIC PANEL
ALBUMIN: 4.2 g/dL (ref 3.5–5.0)
ALK PHOS: 86 U/L (ref 38–126)
ALT: 22 U/L (ref 14–54)
ANION GAP: 8 (ref 5–15)
AST: 24 U/L (ref 15–41)
BUN: 6 mg/dL (ref 6–20)
CALCIUM: 8.8 mg/dL — AB (ref 8.9–10.3)
CHLORIDE: 100 mmol/L — AB (ref 101–111)
CO2: 26 mmol/L (ref 22–32)
Creatinine, Ser: 0.85 mg/dL (ref 0.44–1.00)
GFR calc Af Amer: >60 mL/min (ref >60)
GFR calc non Af Amer: >60 mL/min (ref >60)
GLUCOSE: 112 mg/dL — AB (ref 65–99)
Potassium: 3.7 mmol/L (ref 3.5–5.1)
SODIUM: 134 mmol/L — AB (ref 135–145)
Total Bilirubin: 0.2 mg/dL — ABNORMAL LOW (ref 0.3–1.2)
Total Protein: 8 g/dL (ref 6.5–8.1)

## 2016-07-30 LAB — CBC
HCT: 40.7 % (ref 36.0–46.0)
Hemoglobin: 12.8 g/dL (ref 12.0–15.0)
MCH: 25.5 pg — ABNORMAL LOW (ref 26.0–34.0)
MCHC: 31.4 g/dL (ref 30.0–36.0)
MCV: 81.1 fL (ref 78.0–100.0)
PLATELETS: 354 10*3/uL (ref 150–400)
RBC: 5.02 MIL/uL (ref 3.87–5.11)
RDW: 15.3 % (ref 11.5–15.5)
WBC: 9.5 10*3/uL (ref 4.0–10.5)

## 2016-07-30 LAB — PROTIME-INR
INR: 0.95
PROTHROMBIN TIME: 12.6 s (ref 11.4–15.2)

## 2016-07-30 LAB — I-STAT TROPONIN, ED: Troponin i, poc: 0 ng/mL (ref 0.00–0.08)

## 2016-07-30 LAB — APTT: APTT: 26 s (ref 24–36)

## 2016-07-30 LAB — TROPONIN I

## 2016-07-30 LAB — HCG, SERUM, QUALITATIVE: PREG SERUM: NEGATIVE

## 2016-07-30 LAB — ETHANOL: Alcohol, Ethyl (B): <5 mg/dL (ref <5)

## 2016-07-30 MED ORDER — ASPIRIN 325 MG PO TABS
325.0000 mg | ORAL_TABLET | Freq: Once | ORAL | Status: AC
  Administered 2016-07-30: 325 mg via ORAL
  Filled 2016-07-30: qty 1

## 2016-07-30 MED ORDER — IOPAMIDOL (ISOVUE-370) INJECTION 76%
100.0000 mL | Freq: Once | INTRAVENOUS | Status: AC | PRN
  Administered 2016-07-30: 100 mL via INTRAVENOUS

## 2016-07-30 NOTE — Progress Notes (Signed)
1930 CALL FROM TRIAGE RN [phone] ON TABLE 1938 BEEPER 1941 COMPLETED CALLED [facility] AND SENT TO [facility]

## 2016-07-30 NOTE — ED Notes (Signed)
[facility] paged @ [phone]

## 2016-07-30 NOTE — ED Provider Notes (Signed)
Emergency Department Provider Note   I have reviewed the triage vital signs and the nursing notes.   HISTORY  Chief Complaint Code Stroke   HPI [name] is a 35 y.o. female with PMH of HTN presents to the emergency department with acute onset left arm and leg numbness with left arm weakness. Time of onset was one hour prior to arrival in the emergency department. The patient notes pain in her chest with heavy breathing. She does have a history of anxiety reports that this feels different. He states he continues to have pain chest and the left arm and left leg feel entirely numb to her. She's not appreciate any speech changes or difficulty swallowing. His severe headaches. No associated fever or shaking chills. She has no prior history of similar symptoms. She reports her mom had a stroke when she was 50. Symptoms are persistent and not improved with deep breathing or distraction.   Past Medical History:  Diagnosis Date  . Kidney stones   . Ovarian cyst   . Pregnancy induced hypertension   . Pregnant 08/09/2015    Patient Active Problem List   Diagnosis Date Noted  . Thrombosed external hemorrhoids 01/18/2016  . Preterm delivery, delivered 01/14/2016  . Preterm labor 01/07/2016  . Preterm labor in third trimester without delivery 01/07/2016  . Abdominal pain 09/11/2015  . Rt flank pain 09/11/2015  . Hypokalemia 09/11/2015  . Leukocytosis 09/11/2015  . Anemia 09/11/2015  . Pyelonephritis 09/10/2015  . Right ovarian cyst 08/30/2015    Past Surgical History:  Procedure Laterality Date  . APPENDECTOMY    . HEMORRHOID SURGERY N/A 01/19/2016   Procedure: EXTENSIVE HEMORRHOIDECTOMY;  Surgeon: Franky MachoMark Jenkins, MD;  Location: [facility];  Service: General;  Laterality: N/A;  . kidney stents    . LITHOTRIPSY      Current Outpatient Rx  . Order #: [id number] Class: Historical Med  . Order #: [id number] Class: Print    Allergies Review of patient's allergies indicates  no known allergies.  Family History  Problem Relation Age of Onset  . Cancer Mother     breast  . Diabetes Maternal Grandmother     Social History Social History  Substance Use Topics  . Smoking status: Never Smoker  . Smokeless tobacco: Never Used  . Alcohol use No    Review of Systems  Constitutional: No fever/chills Eyes: No visual changes. ENT: No sore throat. Cardiovascular: Positive chest pain. Respiratory: Positive shortness of breath. Gastrointestinal: No abdominal pain.  No nausea, no vomiting.  No diarrhea.  No constipation. Genitourinary: Negative for dysuria. Musculoskeletal: Negative for back pain. Skin: Negative for rash. Neurological: Negative for headaches. Patient with LUE weakness and left side numbness.   10-point ROS otherwise negative.  ____________________________________________   PHYSICAL EXAM:  VITAL SIGNS: Temp: 98.5 F RR: 28 SpO2: 100% HR: 110 BP: 156/84  Constitutional: Alert and oriented. Patient is tearful and breathing heavily.  Eyes: Conjunctivae are normal. PERRL. EOMI. Head: Atraumatic. Nose: No congestion/rhinnorhea. Mouth/Throat: Mucous membranes are moist.  Oropharynx non-erythematous. Neck: No stridor.  No meningeal signs.   Cardiovascular: Sinus tachycardia. Good peripheral circulation. Grossly normal heart sounds.   Respiratory: Increased respiratory effort.  No retractions. Lungs CTAB. Gastrointestinal: Soft and nontender. No distention.  Musculoskeletal: No lower extremity tenderness nor edema. No gross deformities of extremities. Neurologic:  Normal speech and language. 4+/5 LUE and LLE strength. Decreased sensation to light touch over left arm and leg. No facial asymmetry. Normal CN exam.  Skin:  Skin is warm, dry and intact. No rash noted. Psychiatric: Mood and affect are slightly agitated. Speech and behavior are normal.  ____________________________________________   LABS (all labs ordered are listed, but only  abnormal results are displayed)  Labs Reviewed  CBC - Abnormal; Notable for the following:       Result Value   MCH 25.5 (*)    All other components within normal limits  COMPREHENSIVE METABOLIC PANEL - Abnormal; Notable for the following:    Sodium 134 (*)    Chloride 100 (*)    Glucose, Bld 112 (*)    Calcium 8.8 (*)    Total Bilirubin 0.2 (*)    All other components within normal limits  URINE RAPID DRUG SCREEN, HOSP PERFORMED - Abnormal; Notable for the following:    Cocaine POSITIVE (*)    All other components within normal limits  I-STAT CHEM 8, ED - Abnormal; Notable for the following:    Chloride 100 (*)    BUN 4 (*)    Glucose, Bld 111 (*)    All other components within normal limits  MRSA PCR SCREENING  ETHANOL  PROTIME-INR  APTT  DIFFERENTIAL  URINALYSIS, ROUTINE W REFLEX MICROSCOPIC (NOT AT [facility])  TROPONIN I  HCG, SERUM, QUALITATIVE  I-STAT TROPOININ, ED  I-STAT TROPOININ, ED   ____________________________________________  EKG  Reviewed. No STEMI.  ____________________________________________  RADIOLOGY  Ct Head Wo Contrast  Result Date: 07/30/2016 CLINICAL DATA:  Came home from dinner started having cp and heart racing then started having left side weakness; no known injury EXAM: CT HEAD WITHOUT CONTRAST TECHNIQUE: Contiguous axial images were obtained from the base of the skull through the vertex without intravenous contrast. COMPARISON:  11/09/2008 FINDINGS: The ventricles are normal in size and configuration. There are no parenchymal masses or mass effect, no evidence of an infarct, no extra-axial masses or abnormal fluid collections and no intracranial hemorrhage. The visualized sinuses and mastoid air cells are clear. No skull lesion. No change from the prior study. IMPRESSION: 1. Normal unenhanced CT scan the brain. These results were called by telephone at the time of interpretation on 07/30/2016 at 7:46 pm to Dr. Alona Bene , who verbally acknowledged  these results. Electronically Signed   By: Amie Portland M.D.   On: 07/30/2016 19:46   Ct Angio Chest/abd/pel For Dissection W And/or Wo Contrast  Result Date: 07/30/2016 CLINICAL DATA:  Acute onset of generalized chest pressure and left-sided numbness and weakness. Initial encounter. EXAM: CT ANGIOGRAPHY CHEST, ABDOMEN AND PELVIS TECHNIQUE: Multidetector CT imaging through the chest, abdomen and pelvis was performed using the standard protocol during bolus administration of intravenous contrast. Multiplanar reconstructed images and MIPs were obtained and reviewed to evaluate the vascular anatomy. CONTRAST:  100 mL of Isovue 370 IV contrast COMPARISON:  Pelvic ultrasound from 01/11/2016 FINDINGS: CTA CHEST FINDINGS There is no evidence of aortic dissection. There is no evidence of aneurysmal dilatation. No calcific atherosclerotic disease is seen. The great vessels are grossly unremarkable in appearance. There is no evidence of pulmonary embolus. Minimal left basilar atelectasis is noted. The lungs are otherwise clear. There is no evidence of significant focal consolidation, pleural effusion or pneumothorax. No masses are identified; no abnormal focal contrast enhancement is seen. The mediastinum is unremarkable in appearance. No mediastinal lymphadenopathy is seen. No pericardial effusion is identified. The great vessels are grossly unremarkable in appearance. No axillary lymphadenopathy is seen. The visualized portions of the thyroid gland are unremarkable in appearance. No acute osseous  abnormalities are seen. Review of the MIP images confirms the above findings. CTA ABDOMEN AND PELVIS FINDINGS There is no evidence of aortic dissection. There is no evidence of aneurysmal dilatation. No calcific atherosclerotic disease is seen. The celiac trunk, superior mesenteric artery, bilateral renal arteries and inferior mesenteric artery appear fully patent. The inferior vena cava is grossly unremarkable in appearance.  The liver and spleen are unremarkable in appearance. The gallbladder is within normal limits. The pancreas and adrenal glands are unremarkable. The kidneys are unremarkable in appearance. There is no evidence of hydronephrosis. No renal or ureteral stones are seen. No perinephric stranding is appreciated. No free fluid is identified. The small bowel is unremarkable in appearance. The stomach is within normal limits. No acute vascular abnormalities are seen. The patient is status post appendectomy. The colon is grossly unremarkable in appearance. The bladder is mildly distended and grossly unremarkable. The uterus is unremarkable in appearance. A 4.1 cm right ovarian cyst is likely physiologic in nature. No suspicious adnexal masses are seen. No inguinal lymphadenopathy is seen. No acute osseous abnormalities are identified. Review of the MIP images confirms the above findings. IMPRESSION: 1. No evidence of aortic dissection. No evidence of aneurysmal dilatation. No calcified atherosclerotic disease seen. 2. No evidence of pulmonary embolus. 3. Minimal left basilar atelectasis noted.  Lungs otherwise clear. 4. 4.1 cm right ovarian cyst is likely physiologic in nature. Electronically Signed   By: Roanna Raider M.D.   On: 07/30/2016 22:07    ____________________________________________   PROCEDURES  Procedure(s) performed:   Procedures  None ____________________________________________   INITIAL IMPRESSION / ASSESSMENT AND PLAN / ED COURSE  Pertinent labs & imaging results that were available during my care of the patient were reviewed by me and considered in my medical decision making (see chart for details).  Patient presents to the emergency department for evaluation of acute onset left upper and left lower extremity weakness and numbness. She has associated chest pain. The patient is breathing heavily and does have a history of anxiety. He also has history of high blood pressure and mom had a  stroke at age 46. With focal neurological deficits on my exam I activated code stroke. Plan for emergent CT head and consultation with neurology. Will obtain labs and reassess.   08:10 PM Spoke with Neurologist Dr. Laurell Josephs. He has some concern for CVA but with the patient's chest pain and neurological deficits he would like more evaluation prior to tPA. Patient does have a history of HTN. Plan for CTA of the chest, abdomen, and pelvis to r/o dissection. He will continue to assess for any contraindications to tPA and reassess after CT angio. Have placed this order and will continue to reassess frequently.   10:10 PM Neuro symptoms seem to be resolving but not completely gone. Patient tested positive for cocaine on urine drug screen. She reports possibly coming into contact with cocaine on Saturday while cleaning a client's house but denies intentional use. Denies use today. Paged Neurology to discuss case and update on CTA.   10:20 PM Spoke with Dr. Laurell Josephs. Normal CTA. Symptoms improving. With positive cocaine will not give tPA at this time. Plan for admission and MRI in the AM. Patient given ASA in the ED. Appreciate Neurology assistance.   Discussed patient's case with hospitalist, Dr. Onalee Hua.  Recommend admission to obs, tele bed.  I will place holding orders per their request. Patient and family (if present) updated with plan. Care transferred to hospitalist service.  I  reviewed all nursing notes, vitals, pertinent old records, EKGs, labs, imaging (as available).  ____________________________________________  FINAL CLINICAL IMPRESSION(S) / ED DIAGNOSES  Final diagnoses:  Numbness  Chest pain, unspecified chest pain type     MEDICATIONS GIVEN DURING THIS VISIT:  Medications  aspirin chewable tablet 81 mg (81 mg Oral Given 07/31/16 1017)  iopamidol (ISOVUE-370) 76 % injection 100 mL (100 mLs Intravenous Contrast Given 07/30/16 2101)  aspirin tablet 325 mg (325 mg Oral Given 07/30/16 2224)    LORazepam (ATIVAN) injection 1 mg (1 mg Intravenous Given 07/31/16 0841)     NEW OUTPATIENT MEDICATIONS STARTED DURING THIS VISIT:  None   Note:  This document was prepared using Dragon voice recognition software and may include unintentional dictation errors.  Alona Bene, MD Emergency Medicine   Maia Plan, MD 07/31/16 [phone]

## 2016-07-30 NOTE — ED Notes (Signed)
Pt states she feels about the same.

## 2016-07-30 NOTE — ED Notes (Signed)
[facility] @ [phone]

## 2016-07-30 NOTE — ED Notes (Signed)
Pt ambulated to restroom wife husband at her side to collect a urine sample. Minimal assist needed.

## 2016-07-30 NOTE — ED Notes (Signed)
Pt states arm & legs feeling about the same. States chest pressure remains.

## 2016-07-30 NOTE — ED Notes (Signed)
Pt returned from CT. Pt states numbness in arm & leg is better, but still having chest pressure.

## 2016-07-30 NOTE — ED Notes (Signed)
Pt states she had a pressure to chest while eating around 1730. Pt states under a lot of stress lately, & she can normally "walk it off". Tonight she developed numbness weakness to her left side.

## 2016-07-30 NOTE — ED Notes (Signed)
Pt arrive in room

## 2016-07-31 ENCOUNTER — Observation Stay (HOSPITAL_COMMUNITY): Payer: Medicaid Other

## 2016-07-31 ENCOUNTER — Encounter (HOSPITAL_COMMUNITY): Payer: Self-pay | Admitting: *Deleted

## 2016-07-31 ENCOUNTER — Observation Stay (HOSPITAL_BASED_OUTPATIENT_CLINIC_OR_DEPARTMENT_OTHER): Payer: Medicaid Other

## 2016-07-31 DIAGNOSIS — R079 Chest pain, unspecified: Secondary | ICD-10-CM

## 2016-07-31 DIAGNOSIS — M6289 Other specified disorders of muscle: Secondary | ICD-10-CM | POA: Diagnosis not present

## 2016-07-31 DIAGNOSIS — F1410 Cocaine abuse, uncomplicated: Secondary | ICD-10-CM | POA: Diagnosis not present

## 2016-07-31 DIAGNOSIS — R200 Anesthesia of skin: Secondary | ICD-10-CM

## 2016-07-31 LAB — ECHOCARDIOGRAM COMPLETE
HEIGHTINCHES: 63 in
Weight: 2518.54 oz

## 2016-07-31 LAB — VITAMIN B12: VITAMIN B 12: 199 pg/mL (ref 180–914)

## 2016-07-31 LAB — TSH: TSH: 0.488 u[IU]/mL (ref 0.350–4.500)

## 2016-07-31 LAB — MRSA PCR SCREENING: MRSA BY PCR: NEGATIVE

## 2016-07-31 MED ORDER — LORAZEPAM 2 MG/ML IJ SOLN
1.0000 mg | Freq: Once | INTRAMUSCULAR | Status: AC
  Administered 2016-07-31: 1 mg via INTRAVENOUS
  Filled 2016-07-31: qty 1

## 2016-07-31 MED ORDER — ASPIRIN 81 MG PO CHEW
81.0000 mg | CHEWABLE_TABLET | Freq: Every day | ORAL | Status: DC
  Administered 2016-07-31 – 2016-08-01 (×2): 81 mg via ORAL
  Filled 2016-07-31 (×2): qty 1

## 2016-07-31 MED ORDER — LORAZEPAM 0.5 MG PO TABS
0.5000 mg | ORAL_TABLET | Freq: Once | ORAL | Status: AC
  Administered 2016-07-31: 0.5 mg via ORAL
  Filled 2016-07-31: qty 1

## 2016-07-31 NOTE — Evaluation (Signed)
Occupational Therapy Evaluation Patient Details Name: SHAVONTA GOSSEN MRN: [id number] DOB: 10-06-1981 Today's Date: 07/31/2016    History of Present Illness Takasha L Rackers is a 35 y.o. female with medical history significant of gestational HTN comes in with substernal chest pain earlier today associated with left arm and leg numbness and weakness.  Code stroke was called and teleneuro evaluation recommended no tpa, only aspirin.  Her symptoms have resolved quickly in the ED.  She denies any fevers.  No n/v/d.  Pt cocaine is positive on her drug screen.  She says she accidentally got some on her fingers while cleaning a house and she licked her finger and that's how she has cocaine in her system, she did not know the white substance was cocaine.  She denies snorting or actively doing any drugs, denies any ivda also.  Pt work up grossly normal in the ED, referred for admission for further work up of her chest pain and stroke like symptoms. MRI pending.    Clinical Impression   Pt awake, alert, oriented x4 this am, husband present for evaluation. Pt reports initial symptoms have resolved. Upon evaluation pt demonstrates BUE strength 4/5, coordination and sensation are intact. Pt demonstrates mod independence with ADL completion, increased time required due to fatigue. Pt reports visual disturbance with initial symptoms lasting "for a few minutes." Pt appears tired/lethargic during evaluation, however is at baseline with ADL tasks. No further OT services required at this time.     Follow Up Recommendations  No OT follow up    Equipment Recommendations  None recommended by OT       Precautions / Restrictions Precautions Precautions: None Restrictions Weight Bearing Restrictions: No      Mobility Bed Mobility Overal bed mobility: Independent                Transfers Overall transfer level: Modified independent Equipment used: None                        ADL Overall ADL's : Modified independent                                       General ADL Comments: Pt performs ADL tasks with mod independence, increased time required due to fatigue     Vision Vision Assessment?: Yes Eye Alignment: Within Functional Limits Ocular Range of Motion: Within Functional Limits Alignment/Gaze Preference: Within Defined Limits Tracking/Visual Pursuits: Able to track stimulus in all quads without difficulty Saccades: Within functional limits Convergence: Within functional limits Visual Fields: No apparent deficits          Pertinent Vitals/Pain Pain Assessment: No/denies pain     Hand Dominance Right   Extremity/Trunk Assessment Upper Extremity Assessment Upper Extremity Assessment: Overall WFL for tasks assessed   Lower Extremity Assessment Lower Extremity Assessment: Defer to PT evaluation   Cervical / Trunk Assessment Cervical / Trunk Assessment: Normal   Communication Communication Communication: No difficulties   Cognition Arousal/Alertness: Awake/alert Behavior During Therapy: WFL for tasks assessed/performed Overall Cognitive Status: Within Functional Limits for tasks assessed                                Home Living Family/patient expects to be discharged to:: Private residence Living Arrangements: Spouse/significant other Available Help at Discharge: Family;Available PRN/intermittently Type of Home:  House             Bathroom Shower/Tub: Chief Strategy OfficerTub/shower unit   Bathroom Toilet: Standard     Home Equipment: None   Additional Comments: Pt has 5 children      Prior Functioning/Environment Level of Independence: Independent              End of Session    Activity Tolerance: Patient tolerated treatment well Patient left: in bed;with call bell/phone within reach;with family/visitor present   Time: 1610-96040816-0833 OT Time Calculation (min): 17 min Charges:  OT General Charges $OT  Visit: 1 Procedure OT Evaluation $OT Eval Low Complexity: 1 Procedure G-Codes: OT G-codes **NOT FOR INPATIENT CLASS** Functional Assessment Tool Used: clinical judgement Functional Limitation: Self care Self Care Current Status (V4098(G8987): At least 1 percent but less than 20 percent impaired, limited or restricted Self Care Goal Status (J1914(G8988): At least 1 percent but less than 20 percent impaired, limited or restricted Self Care Discharge Status [phone](G8989): At least 1 percent but less than 20 percent impaired, limited or restricted    Ezra SitesLeslie Loistine Eberlin, OTR/L  [phone] 07/31/2016, 8:42 AM

## 2016-07-31 NOTE — Evaluation (Signed)
Physical Therapy Evaluation Patient Details Name: [name] MRN: [id number] DOB: 01/25/1981 Today's Date: 07/31/2016   History of Present Illness  35 yo F admitted with chest pain, L sided numbness, and weakness, (+) cocaine on drug screen.  Head CT (-) for acute changes.   PMH: HTN, kidney stones, ovarian cyst, pregnancy induced HTN, appendectomy.   Clinical Impression  Pt received in bed, husband present, and pt is agreeable to PT evaluation.  She is normally independent with ambulation and all ADL's.  Eval limited due to transport arriving to take pt for MRI.  Pt demonstrated R LE weakness, as well as unsteadiness with transfer bed<>w/c. Pt is recommended for OPPT upon d/c, and uncertain if pt will need any DME at this time.  Will address after gait assessment has been performed.     Follow Up Recommendations Outpatient PT    Equipment Recommendations  Other (comment) (TBD)    Recommendations for Other Services       Precautions / Restrictions Precautions Precautions: None Restrictions Weight Bearing Restrictions: No      Mobility  Bed Mobility Overal bed mobility: Independent                Transfers Overall transfer level: Needs assistance Equipment used: None Transfers: Sit to/from Stand;Stand Pivot Transfers Sit to Stand: Min guard Stand pivot transfers: Min assist       General transfer comment: Pt is unsteady - holding UE's in high guard position during transfer.  Pt assisted into w/c with transport, who arrived during PT Eval to take pt down for MRI.    Ambulation/Gait                Stairs            Wheelchair Mobility    Modified Rankin (Stroke Patients Only)       Balance Overall balance assessment: Needs assistance Sitting-balance support: No upper extremity supported;Feet supported Sitting balance-Leahy Scale: Good     Standing balance support: No upper extremity supported Standing balance-Leahy Scale:  Fair Standing balance comment: Pt unsteady during transfer.                              Pertinent Vitals/Pain Pain Assessment: No/denies pain    Home Living Family/patient expects to be discharged to:: Private residence Living Arrangements: Spouse/significant other;Children (5 children) Available Help at Discharge: Family;Available PRN/intermittently Type of Home: Mobile home (with an addition) Home Access: Stairs to enter   Entrance Stairs-Number of Steps: 2 into the kitchen Home Layout: One level Home Equipment: None Additional Comments: Pt has 5 children    Prior Function Level of Independence: Independent               Hand Dominance   Dominant Hand: Right    Extremity/Trunk Assessment   Upper Extremity Assessment: Defer to OT evaluation           Lower Extremity Assessment: RLE deficits/detail RLE Deficits / Details: Knee extension: 3-/5, DF: 3-/5    Cervical / Trunk Assessment: Normal  Communication   Communication: No difficulties  Cognition Arousal/Alertness: Awake/alert Behavior During Therapy: WFL for tasks assessed/performed Overall Cognitive Status: Within Functional Limits for tasks assessed                      General Comments      Exercises        Assessment/Plan    PT  Assessment Patient needs continued PT services  PT Diagnosis Difficulty walking;Abnormality of gait;Generalized weakness   PT Problem List Decreased strength;Decreased activity tolerance;Decreased balance;Decreased mobility;Decreased coordination  PT Treatment Interventions Gait training;Functional mobility training;Therapeutic activities;Therapeutic exercise;Balance training;Neuromuscular re-education;Patient/family education;DME instruction;Stair training   PT Goals (Current goals can be found in the Care Plan section) Acute Rehab PT Goals Patient Stated Goal: Pt wants to get stronger PT Goal Formulation: With patient/family Time For Goal  Achievement: 08/07/16 Potential to Achieve Goals: Good    Frequency 7X/week   Barriers to discharge        Co-evaluation               End of Session Equipment Utilized During Treatment: Gait belt Activity Tolerance: Patient tolerated treatment well Patient left: Other (comment) (in w/c with transport, going for MRI. )           Time: 1610-9604 PT Time Calculation (min) (ACUTE ONLY): 9 min   Charges:   PT Evaluation $PT Eval Low Complexity: 1 Procedure     PT G Codes:        Beth Jaivian Battaglini, PT, DPT X: [phone]

## 2016-07-31 NOTE — Progress Notes (Signed)
Patient seen and examined. Admitted after midnight secondary to CP and left side weakness/numbness. Patient with negative troponin and essentially resolution of her numbness/weakness. No SOB, diaphoresis, nausea or vomiting. Hemodynamically stable. CT head neg for acute intracranial abnormalities. Please refer to H&P written by Dr. Onalee Huaavid for further info/details on admission.  Plan: -will complete work up with 2-D echo, Lipid panel, A1C, TSH, B12 and folic acid -counseling regarding UDS abnormalities provided -anticipate discharge home in am.  Vassie LollMadera, Hilda Wexler [phone]

## 2016-07-31 NOTE — Progress Notes (Signed)
*  PRELIMINARY RESULTS* Echocardiogram 2D Echocardiogram has been performed.  [name], [name] 07/31/2016, 3:14 PM

## 2016-07-31 NOTE — Evaluation (Signed)
Clinical/Bedside Swallow Evaluation Patient Details  Name: [name] MRN: [id number] Date of Birth: 03/23/1981  Today's Date: 07/31/2016 Time: SLP Start Time (ACUTE ONLY): 1453 SLP Stop Time (ACUTE ONLY): 1501 SLP Time Calculation (min) (ACUTE ONLY): 8 min  Past Medical History:  Past Medical History:  Diagnosis Date  . Hypertension   . Kidney stones   . Ovarian cyst   . Pregnancy induced hypertension   . Pregnant 08/09/2015   Past Surgical History:  Past Surgical History:  Procedure Laterality Date  . APPENDECTOMY    . HEMORRHOID SURGERY N/A 01/19/2016   Procedure: EXTENSIVE HEMORRHOIDECTOMY;  Surgeon: Franky MachoMark Jenkins, MD;  Location: [facility];  Service: General;  Laterality: N/A;  . kidney stents    . LITHOTRIPSY     HPI:  [name] a 35 y.o.femalewith medical history significant of gestational HTN comes in with substernal chest pain earlier today associated with left arm and leg numbness and weakness. Code stroke was called and teleneuro evaluation recommended no tpa, only aspirin. Her symptoms have resolved quickly in the ED. She denies any fevers. No n/v/d. Pt cocaine is positive on her drug screen. She says she accidentally got some on her fingers while cleaning a house and she licked her finger and that's how she has cocaine in her system, she did not know the white substance was cocaine. She denies snorting or actively doing any drugs, denies any ivda also. CT of  head negative for acute intracranial abnormality   Assessment / Plan / Recommendation Clinical Impression  Pts swallow presents within functional limits. Oral motor exam unremarkable. Recommend regular thin diet initiation with medicines whole. No further ST needs identified.     Aspiration Risk  No limitations    Diet Recommendation     Medication Administration: Whole meds with liquid    Other  Recommendations Oral Care Recommendations: Oral care BID   Follow up  Recommendations       Frequency and Duration            Prognosis        Swallow Study   General Date of Onset: 07/30/16 HPI: [name] a 35 y.o.femalewith medical history significant of gestational HTN comes in with substernal chest pain earlier today associated with left arm and leg numbness and weakness. Code stroke was called and teleneuro evaluation recommended no tpa, only aspirin. Her symptoms have resolved quickly in the ED. She denies any fevers. No n/v/d. Pt cocaine is positive on her drug screen. She says she accidentally got some on her fingers while cleaning a house and she licked her finger and that's how she has cocaine in her system, she did not know the white substance was cocaine. She denies snorting or actively doing any drugs, denies any ivda also. CT of  head negative for acute intracranial abnormality Type of Study: Bedside Swallow Evaluation Diet Prior to this Study: NPO Temperature Spikes Noted: No Respiratory Status: Room air History of Recent Intubation: No Behavior/Cognition: Alert;Cooperative;Pleasant mood Oral Cavity Assessment: Within Functional Limits Oral Cavity - Dentition: Adequate natural dentition Vision: Functional for self-feeding Self-Feeding Abilities: Able to feed self Patient Positioning: Upright in bed Baseline Vocal Quality: Low vocal intensity Volitional Cough: Strong Volitional Swallow: Able to elicit    Oral/Motor/Sensory Function Overall Oral Motor/Sensory Function: Within functional limits   Ice Chips Ice chips: Not tested   Thin Liquid Thin Liquid: Within functional limits Presentation: Cup    Nectar Thick Nectar Thick Liquid: Not tested  Honey Thick Honey Thick Liquid: Not tested   Puree Puree: Within functional limits   Solid   GO   Solid: Within functional limits    Functional Assessment Tool Used: skilled observation Functional Limitations: Swallowing Swallow Current Status (W0981): 0  percent impaired, limited or restricted Swallow Goal Status (X9147): 0 percent impaired, limited or restricted Swallow Discharge Status [phone]): 0 percent impaired, limited or restricted      Marcene Duos MA, CCC-SLP Acute Care Speech Language Pathologist    Kennieth Rad 07/31/2016,3:08 PM

## 2016-07-31 NOTE — H&P (Signed)
History and Physical    [name] ZOX:[id number] DOB: Apr 30, 1981 DOA: 07/30/2016  PCP: Lazaro Arms, MD  Patient coming from: home  Chief Complaint:   Chest pain and left sided numbness and weakness  HPI: [name] is a 35 y.o. female with medical history significant of gestational HTN comes in with substernal chest pain earlier today associated with left arm and leg numbness and weakness.  Code stroke was called and teleneuro evaluation recommended no tpa, only aspirin.  Her symptoms have resolved quickly in the ED.  She denies any fevers.  No n/v/d.  Pt cocaine is positive on her drug screen.  She says she accidentally got some on her fingers while cleaning a house and she licked her finger and that's how she has cocaine in her system, she did not know the white substance was cocaine.  She denies snorting or actively doing any drugs, denies any ivda also.  Pt work up grossly normal in the ED, referred for admission for further work up of her chest pain and stroke like symptoms.   Review of Systems: As per HPI otherwise 10 point review of systems negative.   Past Medical History:  Diagnosis Date  . Hypertension   . Kidney stones   . Ovarian cyst   . Pregnancy induced hypertension   . Pregnant 08/09/2015    Past Surgical History:  Procedure Laterality Date  . APPENDECTOMY    . HEMORRHOID SURGERY N/A 01/19/2016   Procedure: EXTENSIVE HEMORRHOIDECTOMY;  Surgeon: Franky Macho, MD;  Location: [facility];  Service: General;  Laterality: N/A;  . kidney stents    . LITHOTRIPSY       reports that she has never smoked. She has never used smokeless tobacco. She reports that she does not drink alcohol or use drugs.  No Known Allergies  Family History  Problem Relation Age of Onset  . Cancer Mother     breast  . Diabetes Maternal Grandmother     Prior to Admission medications   Medication Sig Start Date End Date Taking? Authorizing Provider  acetaminophen  (TYLENOL) 325 MG tablet Take 650 mg by mouth every 6 (six) hours as needed.   Yes Historical Provider, MD    Physical Exam: Vitals:   07/30/16 2130 07/30/16 2145 07/30/16 2200 07/30/16 2215  BP: 149/89 139/87 141/88 147/93  Pulse: 90 90 93 92  Resp: 18 18 14 22   Temp:      TempSrc:      SpO2: 100% 100% 100% 100%  Weight:      Height:          Constitutional: NAD, calm, comfortable Vitals:   07/30/16 2130 07/30/16 2145 07/30/16 2200 07/30/16 2215  BP: 149/89 139/87 141/88 147/93  Pulse: 90 90 93 92  Resp: 18 18 14 22   Temp:      TempSrc:      SpO2: 100% 100% 100% 100%  Weight:      Height:       Eyes: PERRL, lids and conjunctivae normal ENMT: Mucous membranes are moist. Posterior pharynx clear of any exudate or lesions.Normal dentition.  Neck: normal, supple, no masses, no thyromegaly Respiratory: clear to auscultation bilaterally, no wheezing, no crackles. Normal respiratory effort. No accessory muscle use.  Cardiovascular: Regular rate and rhythm, no murmurs / rubs / gallops. No extremity edema. 2+ pedal pulses. No carotid bruits.  Abdomen: no tenderness, no masses palpated. No hepatosplenomegaly. Bowel sounds positive.  Musculoskeletal: no clubbing / cyanosis. No joint  deformity upper and lower extremities. Good ROM, no contractures. Normal muscle tone.  Skin: no rashes, lesions, ulcers. No induration Neurologic: CN 2-12 grossly intact. Sensation intact, DTR normal. Strength 5/5 in all 4.  Psychiatric: Normal judgment and insight. Alert and oriented x 3. Normal mood.    Labs on Admission: I have personally reviewed following labs and imaging studies  CBC:  Recent Labs Lab 07/30/16 1942 07/30/16 1952  WBC 9.5  --   NEUTROABS 6.1  --   HGB 12.8 14.3  HCT 40.7 42.0  MCV 81.1  --   PLT 354  --    Basic Metabolic Panel:  Recent Labs Lab 07/30/16 1942 07/30/16 1952  NA 134* 140  K 3.7 3.8  CL 100* 100*  CO2 26  --   GLUCOSE 112* 111*  BUN 6 4*    CREATININE 0.85 0.80  CALCIUM 8.8*  --    GFR: Estimated Creatinine Clearance: 91.8 mL/min (by C-G formula based on SCr of 0.8 mg/dL). Liver Function Tests:  Recent Labs Lab 07/30/16 1942  AST 24  ALT 22  ALKPHOS 86  BILITOT 0.2*  PROT 8.0  ALBUMIN 4.2   Coagulation Profile:  Recent Labs Lab 07/30/16 1942  INR 0.95   Cardiac Enzymes:  Recent Labs Lab 07/30/16 1942  TROPONINI <0.03   Urine analysis:    Component Value Date/Time   COLORURINE YELLOW 07/30/2016 1920   APPEARANCEUR CLEAR 07/30/2016 1920   APPEARANCEUR Cloudy (A) 08/09/2015 1203   LABSPEC 1.010 07/30/2016 1920   PHURINE 6.5 07/30/2016 1920   GLUCOSEU NEGATIVE 07/30/2016 1920   HGBUR NEGATIVE 07/30/2016 1920   BILIRUBINUR NEGATIVE 07/30/2016 1920   BILIRUBINUR Negative 08/09/2015 1203   KETONESUR NEGATIVE 07/30/2016 1920   PROTEINUR NEGATIVE 07/30/2016 1920   UROBILINOGEN 0.2 10/16/2015 2236   NITRITE NEGATIVE 07/30/2016 1920   LEUKOCYTESUR NEGATIVE 07/30/2016 1920   LEUKOCYTESUR Negative 08/09/2015 1203   Radiological Exams on Admission: Ct Head Wo Contrast  Result Date: 07/30/2016 CLINICAL DATA:  Came home from dinner started having cp and heart racing then started having left side weakness; no known injury EXAM: CT HEAD WITHOUT CONTRAST TECHNIQUE: Contiguous axial images were obtained from the base of the skull through the vertex without intravenous contrast. COMPARISON:  11/09/2008 FINDINGS: The ventricles are normal in size and configuration. There are no parenchymal masses or mass effect, no evidence of an infarct, no extra-axial masses or abnormal fluid collections and no intracranial hemorrhage. The visualized sinuses and mastoid air cells are clear. No skull lesion. No change from the prior study. IMPRESSION: 1. Normal unenhanced CT scan the brain. These results were called by telephone at the time of interpretation on 07/30/2016 at 7:46 pm to Dr. Alona BeneJOSHUA LONG , who verbally acknowledged these  results. Electronically Signed   By: Amie Portlandavid  Ormond M.D.   On: 07/30/2016 19:46   Ct Angio Chest/abd/pel For Dissection W And/or Wo Contrast  Result Date: 07/30/2016 CLINICAL DATA:  Acute onset of generalized chest pressure and left-sided numbness and weakness. Initial encounter. EXAM: CT ANGIOGRAPHY CHEST, ABDOMEN AND PELVIS TECHNIQUE: Multidetector CT imaging through the chest, abdomen and pelvis was performed using the standard protocol during bolus administration of intravenous contrast. Multiplanar reconstructed images and MIPs were obtained and reviewed to evaluate the vascular anatomy. CONTRAST:  100 mL of Isovue 370 IV contrast COMPARISON:  Pelvic ultrasound from 01/11/2016 FINDINGS: CTA CHEST FINDINGS There is no evidence of aortic dissection. There is no evidence of aneurysmal dilatation. No calcific atherosclerotic disease is  seen. The great vessels are grossly unremarkable in appearance. There is no evidence of pulmonary embolus. Minimal left basilar atelectasis is noted. The lungs are otherwise clear. There is no evidence of significant focal consolidation, pleural effusion or pneumothorax. No masses are identified; no abnormal focal contrast enhancement is seen. The mediastinum is unremarkable in appearance. No mediastinal lymphadenopathy is seen. No pericardial effusion is identified. The great vessels are grossly unremarkable in appearance. No axillary lymphadenopathy is seen. The visualized portions of the thyroid gland are unremarkable in appearance. No acute osseous abnormalities are seen. Review of the MIP images confirms the above findings. CTA ABDOMEN AND PELVIS FINDINGS There is no evidence of aortic dissection. There is no evidence of aneurysmal dilatation. No calcific atherosclerotic disease is seen. The celiac trunk, superior mesenteric artery, bilateral renal arteries and inferior mesenteric artery appear fully patent. The inferior vena cava is grossly unremarkable in appearance. The  liver and spleen are unremarkable in appearance. The gallbladder is within normal limits. The pancreas and adrenal glands are unremarkable. The kidneys are unremarkable in appearance. There is no evidence of hydronephrosis. No renal or ureteral stones are seen. No perinephric stranding is appreciated. No free fluid is identified. The small bowel is unremarkable in appearance. The stomach is within normal limits. No acute vascular abnormalities are seen. The patient is status post appendectomy. The colon is grossly unremarkable in appearance. The bladder is mildly distended and grossly unremarkable. The uterus is unremarkable in appearance. A 4.1 cm right ovarian cyst is likely physiologic in nature. No suspicious adnexal masses are seen. No inguinal lymphadenopathy is seen. No acute osseous abnormalities are identified. Review of the MIP images confirms the above findings. IMPRESSION: 1. No evidence of aortic dissection. No evidence of aneurysmal dilatation. No calcified atherosclerotic disease seen. 2. No evidence of pulmonary embolus. 3. Minimal left basilar atelectasis noted.  Lungs otherwise clear. 4. 4.1 cm right ovarian cyst is likely physiologic in nature. Electronically Signed   By: Roanna Raider M.D.   On: 07/30/2016 22:07    EKG: Independently reviewed. nsr no acute issues   Assessment/Plan 35 yo female with cocaine use, chest pain and left sided weakness  Principal Problem:   Acute left-sided weakness-  cta neg, ct head neg.  Obtain mri brain in the am.  Cont on aspirin.  freq neuro checks.  Active Problems:   Cocaine abuse- noted   HTN (hypertension)- noted   Chest pain- serial troponin overnight.  Cardiac echo in the am    obs on tele.  Written orders due to epic downtime   DVT prophylaxis:   scds Code Status:   Full code.  Salimata Christenson A MD [facility]  If 7PM-7AM, please contact night-coverage [email or web address] Password [email or web address]  07/31/2016, 1:36 AM

## 2016-08-01 DIAGNOSIS — R079 Chest pain, unspecified: Secondary | ICD-10-CM | POA: Diagnosis not present

## 2016-08-01 DIAGNOSIS — IMO0001 Reserved for inherently not codable concepts without codable children: Secondary | ICD-10-CM

## 2016-08-01 DIAGNOSIS — R7303 Prediabetes: Secondary | ICD-10-CM | POA: Diagnosis not present

## 2016-08-01 DIAGNOSIS — M6289 Other specified disorders of muscle: Secondary | ICD-10-CM | POA: Diagnosis not present

## 2016-08-01 DIAGNOSIS — E785 Hyperlipidemia, unspecified: Secondary | ICD-10-CM

## 2016-08-01 DIAGNOSIS — R7309 Other abnormal glucose: Secondary | ICD-10-CM

## 2016-08-01 DIAGNOSIS — R030 Elevated blood-pressure reading, without diagnosis of hypertension: Secondary | ICD-10-CM | POA: Diagnosis not present

## 2016-08-01 DIAGNOSIS — M6281 Muscle weakness (generalized): Secondary | ICD-10-CM

## 2016-08-01 LAB — FOLATE RBC
Folate, Hemolysate: 322.5 ng/mL
Folate, RBC: 838 ng/mL (ref >498)
Hematocrit: 38.5 % (ref 34.0–46.6)

## 2016-08-01 LAB — BASIC METABOLIC PANEL
Anion gap: 9 (ref 5–15)
BUN: 9 mg/dL (ref 6–20)
CALCIUM: 8.7 mg/dL — AB (ref 8.9–10.3)
CO2: 24 mmol/L (ref 22–32)
CREATININE: 0.69 mg/dL (ref 0.44–1.00)
Chloride: 101 mmol/L (ref 101–111)
GFR calc Af Amer: >60 mL/min (ref >60)
GLUCOSE: 154 mg/dL — AB (ref 65–99)
Potassium: 3.3 mmol/L — ABNORMAL LOW (ref 3.5–5.1)
Sodium: 134 mmol/L — ABNORMAL LOW (ref 135–145)

## 2016-08-01 LAB — LIPID PANEL
Cholesterol: 234 mg/dL — ABNORMAL HIGH (ref 0–200)
HDL: 38 mg/dL — AB (ref >40)
LDL CALC: 160 mg/dL — AB (ref 0–99)
TRIGLYCERIDES: 180 mg/dL — AB (ref <150)
Total CHOL/HDL Ratio: 6.2 RATIO
VLDL: 36 mg/dL (ref 0–40)

## 2016-08-01 LAB — HEMOGLOBIN A1C
HEMOGLOBIN A1C: 5.8 % — AB (ref 4.8–5.6)
Mean Plasma Glucose: 120 mg/dL

## 2016-08-01 MED ORDER — FISH OIL 1000 MG PO CAPS: 1000.0000 mg | ORAL_CAPSULE | Freq: Two times a day (BID) | ORAL | 2 refills | Status: DC

## 2016-08-01 NOTE — Discharge Summary (Signed)
Physician Discharge Summary  [name] ZHY:[id number] DOB: 1981/02/02 DOA: 07/30/2016  PCP: Lazaro Arms, MD  Admit date: 07/30/2016 Discharge date: 08/01/2016  Time spent: 35 minutes  Recommendations for Outpatient Follow-up:  Reassess BP and if needed initiate treatment with antihypertensive drugs  Close follow up to patient's CBG's (prediabetic with A1C 5.8) Repeat CMET and lipid panel in 3 months; if needed initiate treatment with statins   Discharge Diagnoses:  Acute left-sided weakness HLD Cocaine use Pain in the chest Elevated BP Prediabetes   Discharge Condition: stable and improved. Discharge home with follow up in 10 days with PCP.  Diet recommendation: heart healthy and low carbohydrates diet   Filed Weights   07/30/16 1944 07/31/16 0030 07/31/16 0500  Weight: 70.8 kg (156 lb) 71.4 kg (157 lb 6.5 oz) 71.4 kg (157 lb 6.5 oz)    History of present illness:  As per H&P written by Dr. Onalee Hua on 07/31/16 35 y.o. female with medical history significant of gestational HTN comes in with substernal chest pain earlier today associated with left arm and leg numbness and weakness.  Code stroke was called and teleneuro evaluation recommended no tpa, only aspirin.  Her symptoms have resolved quickly in the ED.  She denies any fevers.  No n/v/d.  Pt cocaine is positive on her drug screen.  She says she accidentally got some on her fingers while cleaning a house and she licked her finger and that's how she has cocaine in her system, she did not know the white substance was cocaine.  She denies snorting or actively doing any drugs, denies any ivda also.  Pt work up grossly normal in the ED, referred for admission for further work up of her chest pain and stroke like symptoms.  Hospital Course:  1-chest pain: presumed to be secondary to vasospasm with cocaine (according to patient non-intentional use) -troponin neg X 3 and no acute ischemic changes on EKG -telemetry w/o  abnormalities -echo reassuring with preserved EF and no wall motion abnormalities  -patient advise to stop any exposure to recreational drugs and to follow heart healthy diet  2-HLD: -advise to follow heart healthy diet and to use fish oil BID -will benefit of reassessment of lipid profile in 3 months -further treatment to be decided base on future labs results  3-elevated BP: BP up on admission  -most likely due to exposure to recreational drug -since then remains stable -advise to follow heart healthy diet -reassessment on BP on follow up visit  4-Pre-diabetes: -A1C 5.8 -had gestational diabetes -advise to follow strict control of carbohydrates intake -will need close follow up and initiation of hypoglycemic regimen depending on further fluctuation of her CBG's -patient advise to keep herself well hydrated  5-positive UDS: per patient secondary to non intentional exposure to cocaine -counseling provided  6-left side weakness/numbness: -associated with cocaine use most likely -symptoms resolved -no abnormalities on CT head -MRI unable to be completed as per patient request and hx of claustrophobia   Procedures:  See below for x-ray reports   2-D echo - Left ventricle: The cavity size was normal. Wall thickness was   increased in a pattern of mild LVH. Systolic function was normal.   The estimated ejection fraction was in the range of 60% to 65%.   Wall motion was normal; there were no regional wall motion   abnormalities. Doppler parameters are consistent with abnormal   left ventricular relaxation (grade 1 diastolic dysfunction). - Aortic valve: Valve area (VTI): 2.67 cm^2.  Valve area (Vmax):   2.54 cm^2. - Technically adequate study.   Consultations:  None   Discharge Exam: Vitals:   07/31/16 2133 08/01/16 0520  BP: (!) 135/101 126/74  Pulse: 96 78  Resp: 20 20  Temp: 99.1 F (37.3 C) 97.7 F (36.5 C)    General: afebrile, denies any further CP, SOB  or any weakness. AAOX3 Cardiovascular: S1 and S2, no rubs, no gallops, no murmurs appreciated on exam Respiratory: CTA bilaterally  Abd: soft, NT, ND, positive BS Extremities: no edema or cyanosis   Discharge Instructions   Discharge Instructions    Diet - low sodium heart healthy    Complete by:  As directed   Discharge instructions    Complete by:  As directed   Take medications as prescribed Follow up with PCP in 10 days Please follow low carbohydrates diet and low fat diet Keep yourself well hydrated Avoid exposure to any recreational drugs   Increase activity slowly    Complete by:  As directed     Current Discharge Medication List    START taking these medications   Details  Omega-3 Fatty Acids (FISH OIL) 1000 MG CAPS Take 1 capsule (1,000 mg total) by mouth 2 (two) times daily. Qty: 60 capsule, Refills: 2      CONTINUE these medications which have NOT CHANGED   Details  acetaminophen (TYLENOL) 325 MG tablet Take 650 mg by mouth every 6 (six) hours as needed.       No Known Allergies Follow-up Information    Lazaro Arms, MD. Schedule an appointment as soon as possible for a visit in 10 day(s).   Specialties:  Obstetrics and Gynecology, Radiology Contact information: [address] [place] [address] [phone]           The results of significant diagnostics from this hospitalization (including imaging, microbiology, ancillary and laboratory) are listed below for reference.    Significant Diagnostic Studies: Ct Head Wo Contrast  Result Date: 07/30/2016 CLINICAL DATA:  Came home from dinner started having cp and heart racing then started having left side weakness; no known injury EXAM: CT HEAD WITHOUT CONTRAST TECHNIQUE: Contiguous axial images were obtained from the base of the skull through the vertex without intravenous contrast. COMPARISON:  11/09/2008 FINDINGS: The ventricles are normal in size and configuration. There are no parenchymal  masses or mass effect, no evidence of an infarct, no extra-axial masses or abnormal fluid collections and no intracranial hemorrhage. The visualized sinuses and mastoid air cells are clear. No skull lesion. No change from the prior study. IMPRESSION: 1. Normal unenhanced CT scan the brain. These results were called by telephone at the time of interpretation on 07/30/2016 at 7:46 pm to Dr. Alona Bene , who verbally acknowledged these results. Electronically Signed   By: Amie Portland M.D.   On: 07/30/2016 19:46   Ct Angio Chest/abd/pel For Dissection W And/or Wo Contrast  Result Date: 07/30/2016 CLINICAL DATA:  Acute onset of generalized chest pressure and left-sided numbness and weakness. Initial encounter. EXAM: CT ANGIOGRAPHY CHEST, ABDOMEN AND PELVIS TECHNIQUE: Multidetector CT imaging through the chest, abdomen and pelvis was performed using the standard protocol during bolus administration of intravenous contrast. Multiplanar reconstructed images and MIPs were obtained and reviewed to evaluate the vascular anatomy. CONTRAST:  100 mL of Isovue 370 IV contrast COMPARISON:  Pelvic ultrasound from 01/11/2016 FINDINGS: CTA CHEST FINDINGS There is no evidence of aortic dissection. There is no evidence of aneurysmal dilatation. No calcific  atherosclerotic disease is seen. The great vessels are grossly unremarkable in appearance. There is no evidence of pulmonary embolus. Minimal left basilar atelectasis is noted. The lungs are otherwise clear. There is no evidence of significant focal consolidation, pleural effusion or pneumothorax. No masses are identified; no abnormal focal contrast enhancement is seen. The mediastinum is unremarkable in appearance. No mediastinal lymphadenopathy is seen. No pericardial effusion is identified. The great vessels are grossly unremarkable in appearance. No axillary lymphadenopathy is seen. The visualized portions of the thyroid gland are unremarkable in appearance. No acute osseous  abnormalities are seen. Review of the MIP images confirms the above findings. CTA ABDOMEN AND PELVIS FINDINGS There is no evidence of aortic dissection. There is no evidence of aneurysmal dilatation. No calcific atherosclerotic disease is seen. The celiac trunk, superior mesenteric artery, bilateral renal arteries and inferior mesenteric artery appear fully patent. The inferior vena cava is grossly unremarkable in appearance. The liver and spleen are unremarkable in appearance. The gallbladder is within normal limits. The pancreas and adrenal glands are unremarkable. The kidneys are unremarkable in appearance. There is no evidence of hydronephrosis. No renal or ureteral stones are seen. No perinephric stranding is appreciated. No free fluid is identified. The small bowel is unremarkable in appearance. The stomach is within normal limits. No acute vascular abnormalities are seen. The patient is status post appendectomy. The colon is grossly unremarkable in appearance. The bladder is mildly distended and grossly unremarkable. The uterus is unremarkable in appearance. A 4.1 cm right ovarian cyst is likely physiologic in nature. No suspicious adnexal masses are seen. No inguinal lymphadenopathy is seen. No acute osseous abnormalities are identified. Review of the MIP images confirms the above findings. IMPRESSION: 1. No evidence of aortic dissection. No evidence of aneurysmal dilatation. No calcified atherosclerotic disease seen. 2. No evidence of pulmonary embolus. 3. Minimal left basilar atelectasis noted.  Lungs otherwise clear. 4. 4.1 cm right ovarian cyst is likely physiologic in nature. Electronically Signed   By: Roanna Raider M.D.   On: 07/30/2016 22:07    Microbiology: Recent Results (from the past 240 hour(s))  MRSA PCR Screening     Status: None   Collection Time: 07/31/16  6:09 AM  Result Value Ref Range Status   MRSA by PCR NEGATIVE NEGATIVE Final    Comment:        The GeneXpert MRSA Assay  (FDA approved for NASAL specimens only), is one component of a comprehensive MRSA colonization surveillance program. It is not intended to diagnose MRSA infection nor to guide or monitor treatment for MRSA infections.      Labs: Basic Metabolic Panel:  Recent Labs Lab 07/30/16 1942 07/30/16 1952 08/01/16 0523  NA 134* 140 134*  K 3.7 3.8 3.3*  CL 100* 100* 101  CO2 26  --  24  GLUCOSE 112* 111* 154*  BUN 6 4* 9  CREATININE 0.85 0.80 0.69  CALCIUM 8.8*  --  8.7*   Liver Function Tests:  Recent Labs Lab 07/30/16 1942  AST 24  ALT 22  ALKPHOS 86  BILITOT 0.2*  PROT 8.0  ALBUMIN 4.2   CBC:  Recent Labs Lab 07/30/16 1942 07/30/16 1952  WBC 9.5  --   NEUTROABS 6.1  --   HGB 12.8 14.3  HCT 40.7 42.0  MCV 81.1  --   PLT 354  --    Cardiac Enzymes:  Recent Labs Lab 07/30/16 1942  TROPONINI <0.03    Signed:  Vassie Loll MD.  [facility] 08/01/2016, 11:10 AM

## 2016-08-01 NOTE — Progress Notes (Signed)
Patient being d/c home with instructions. IV cath removed and intact. No c/o pain /swelling at site and at this time.

## 2017-12-23 NOTE — L&D Delivery Note (Addendum)
Delivery Note  Patient: [name] MRN: [id number]  GBS status: Negative  Patient is a 37 y.o. now [id number] s/p NSVD at [redacted]w[redacted]d, who was admitted for IOL due to pre-eclampsia with severe features. AROM 1h 16m prior to delivery with clear fluid.   A  viable female infant was delivered via  (Presentation: Right OA).  APGAR: 9,9 weight pending.   Placenta status: Spontaneous, intact  Cord: 3 vessel, intact with the following complications: None  Anesthesia: Epidural   Episiotomy: No  Lacerations: None  Suture Repair: N/A Est. Blood Loss (mL): 408   Head delivered right OA. No nuchal cord present. Shoulder and body delivered in usual fashion. Infant with spontaneous cry, placed on mother's abdomen, dried and bulb suctioned. Cord clamped x 2 after 1-minute delay, and cut by family member. Cord blood drawn. Placenta delivered spontaneously with gentle cord traction. Fundus firm with massage and Pitocin. Perineum inspected and found to have no lacerations. Noted a fluctuant 0.5 x 0.5 cm cyst-like mass with associated labial swelling on the left labia majora at the posterior introitus. No associated pain, erythema, or drainage in the area.    Mom to postpartum (antepartum floor for continued Mg infusion).  Baby to Couplet care / Skin to Skin.  Allayne Stack 10/03/2018, 4:22 PM  Attestation: I have seen this patient and agree with the resident's documentation. I was present for the entire delivery. I counseled the patient regarding risks/benefits of BTL. She initially elected to proceed with BTL and then changed her mind. Is deciding between IUD/Nexplanon and Depo shot.   Cristal Deer. Earlene Plater, DO OB/GYN Fellow

## 2018-03-17 ENCOUNTER — Ambulatory Visit: Payer: Self-pay

## 2018-03-17 DIAGNOSIS — N912 Amenorrhea, unspecified: Secondary | ICD-10-CM

## 2018-03-17 DIAGNOSIS — Z3201 Encounter for pregnancy test, result positive: Secondary | ICD-10-CM

## 2018-03-17 LAB — POCT URINE PREGNANCY: Preg Test, Ur: POSITIVE — AB

## 2018-03-17 NOTE — Progress Notes (Signed)
I have reviewed this chart and agree with the RN/CMA assessment and management.    K. Meryl Davis, M.D. Attending Obstetrician & Gynecologist, [facility],  [facility]  

## 2018-03-17 NOTE — Progress Notes (Signed)
Pt here for pregnancy test. Test today is positive. LMP 01/12/18. EDD 10/19/18

## 2018-04-07 ENCOUNTER — Encounter: Payer: Self-pay | Admitting: Obstetrics & Gynecology

## 2018-04-07 ENCOUNTER — Ambulatory Visit (INDEPENDENT_AMBULATORY_CARE_PROVIDER_SITE_OTHER): Payer: Medicaid Other | Admitting: Obstetrics & Gynecology

## 2018-04-07 VITALS — BP 119/82 | HR 94 | Temp 97.7°F | Wt 158.4 lb

## 2018-04-07 DIAGNOSIS — O09529 Supervision of elderly multigravida, unspecified trimester: Secondary | ICD-10-CM

## 2018-04-07 DIAGNOSIS — O09899 Supervision of other high risk pregnancies, unspecified trimester: Secondary | ICD-10-CM

## 2018-04-07 DIAGNOSIS — Z3689 Encounter for other specified antenatal screening: Secondary | ICD-10-CM

## 2018-04-07 DIAGNOSIS — Z1151 Encounter for screening for human papillomavirus (HPV): Secondary | ICD-10-CM

## 2018-04-07 DIAGNOSIS — Z113 Encounter for screening for infections with a predominantly sexual mode of transmission: Secondary | ICD-10-CM

## 2018-04-07 DIAGNOSIS — Z124 Encounter for screening for malignant neoplasm of cervix: Secondary | ICD-10-CM | POA: Diagnosis not present

## 2018-04-07 DIAGNOSIS — Z3480 Encounter for supervision of other normal pregnancy, unspecified trimester: Secondary | ICD-10-CM

## 2018-04-07 DIAGNOSIS — O0990 Supervision of high risk pregnancy, unspecified, unspecified trimester: Secondary | ICD-10-CM

## 2018-04-07 DIAGNOSIS — N760 Acute vaginitis: Secondary | ICD-10-CM | POA: Diagnosis not present

## 2018-04-07 DIAGNOSIS — O09219 Supervision of pregnancy with history of pre-term labor, unspecified trimester: Secondary | ICD-10-CM

## 2018-04-07 DIAGNOSIS — Z3481 Encounter for supervision of other normal pregnancy, first trimester: Secondary | ICD-10-CM

## 2018-04-07 DIAGNOSIS — R300 Dysuria: Secondary | ICD-10-CM

## 2018-04-07 HISTORY — DX: Supervision of elderly multigravida, unspecified trimester: O09.529

## 2018-04-07 HISTORY — DX: Supervision of high risk pregnancy, unspecified, unspecified trimester: O09.90

## 2018-04-07 LAB — POCT URINALYSIS DIP (MANUAL ENTRY)
BILIRUBIN UA: NEGATIVE mg/dL
Bilirubin, UA: NEGATIVE
Glucose, UA: NEGATIVE mg/dL
Nitrite, UA: NEGATIVE
PROTEIN UA: NEGATIVE mg/dL
SPEC GRAV UA: 1.01 (ref 1.010–1.025)
Urobilinogen, UA: 0.2 E.U./dL
pH, UA: 7 (ref 5.0–8.0)

## 2018-04-07 MED ORDER — ASPIRIN EC 81 MG PO TBEC: 81.0000 mg | DELAYED_RELEASE_TABLET | Freq: Every day | ORAL | 2 refills | Status: DC

## 2018-04-07 NOTE — Patient Instructions (Signed)
First Trimester of Pregnancy The first trimester of pregnancy is from week 1 until the end of week 13 (months 1 through 3). A week after a sperm fertilizes an egg, the egg will implant on the wall of the uterus. This embryo will begin to develop into a baby. Genes from you and your partner will form the baby. The female genes will determine whether the baby will be a boy or a girl. At 6-8 weeks, the eyes and face will be formed, and the heartbeat can be seen on ultrasound. At the end of 12 weeks, all the baby's organs will be formed. Now that you are pregnant, you will want to do everything you can to have a healthy baby. Two of the most important things are to get good prenatal care and to follow your health care provider's instructions. Prenatal care is all the medical care you receive before the baby's birth. This care will help prevent, find, and treat any problems during the pregnancy and childbirth. Body changes during your first trimester Your body goes through many changes during pregnancy. The changes vary from woman to woman.  You may gain or lose a couple of pounds at first.  You may feel sick to your stomach (nauseous) and you may throw up (vomit). If the vomiting is uncontrollable, call your health care provider.  You may tire easily.  You may develop headaches that can be relieved by medicines. All medicines should be approved by your health care provider.  You may urinate more often. Painful urination may mean you have a bladder infection.  You may develop heartburn as a result of your pregnancy.  You may develop constipation because certain hormones are causing the muscles that push stool through your intestines to slow down.  You may develop hemorrhoids or swollen veins (varicose veins).  Your breasts may begin to grow larger and become tender. Your nipples may stick out more, and the tissue that surrounds them (areola) may become darker.  Your gums may bleed and may be  sensitive to brushing and flossing.  Dark spots or blotches (chloasma, mask of pregnancy) may develop on your face. This will likely fade after the baby is born.  Your menstrual periods will stop.  You may have a loss of appetite.  You may develop cravings for certain kinds of food.  You may have changes in your emotions from day to day, such as being excited to be pregnant or being concerned that something may go wrong with the pregnancy and baby.  You may have more vivid and strange dreams.  You may have changes in your hair. These can include thickening of your hair, rapid growth, and changes in texture. Some women also have hair loss during or after pregnancy, or hair that feels dry or thin. Your hair will most likely return to normal after your baby is born.  What to expect at prenatal visits During a routine prenatal visit:  You will be weighed to make sure you and the baby are growing normally.  Your blood pressure will be taken.  Your abdomen will be measured to track your baby's growth.  The fetal heartbeat will be listened to between weeks 10 and 14 of your pregnancy.  Test results from any previous visits will be discussed.  Your health care provider may ask you:  How you are feeling.  If you are feeling the baby move.  If you have had any abnormal symptoms, such as leaking fluid, bleeding, severe headaches,   or abdominal cramping.  If you are using any tobacco products, including cigarettes, chewing tobacco, and electronic cigarettes.  If you have any questions.  Other tests that may be performed during your first trimester include:  Blood tests to find your blood type and to check for the presence of any previous infections. The tests will also be used to check for low iron levels (anemia) and protein on red blood cells (Rh antibodies). Depending on your risk factors, or if you previously had diabetes during pregnancy, you may have tests to check for high blood  sugar that affects pregnant women (gestational diabetes).  Urine tests to check for infections, diabetes, or protein in the urine.  An ultrasound to confirm the proper growth and development of the baby.  Fetal screens for spinal cord problems (spina bifida) and Down syndrome.  HIV (human immunodeficiency virus) testing. Routine prenatal testing includes screening for HIV, unless you choose not to have this test.  You may need other tests to make sure you and the baby are doing well.  Follow these instructions at home: Medicines  Follow your health care provider's instructions regarding medicine use. Specific medicines may be either safe or unsafe to take during pregnancy.  Take a prenatal vitamin that contains at least 600 micrograms (mcg) of folic acid.  If you develop constipation, try taking a stool softener if your health care provider approves. Eating and drinking  Eat a balanced diet that includes fresh fruits and vegetables, whole grains, good sources of protein such as meat, eggs, or tofu, and low-fat dairy. Your health care provider will help you determine the amount of weight gain that is right for you.  Avoid raw meat and uncooked cheese. These carry germs that can cause birth defects in the baby.  Eating four or five small meals rather than three large meals a day may help relieve nausea and vomiting. If you start to feel nauseous, eating a few soda crackers can be helpful. Drinking liquids between meals, instead of during meals, also seems to help ease nausea and vomiting.  Limit foods that are high in fat and processed sugars, such as fried and sweet foods.  To prevent constipation: ? Eat foods that are high in fiber, such as fresh fruits and vegetables, whole grains, and beans. ? Drink enough fluid to keep your urine clear or pale yellow. Activity  Exercise only as directed by your health care provider. Most women can continue their usual exercise routine during  pregnancy. Try to exercise for 30 minutes at least 5 days a week. Exercising will help you: ? Control your weight. ? Stay in shape. ? Be prepared for labor and delivery.  Experiencing pain or cramping in the lower abdomen or lower back is a good sign that you should stop exercising. Check with your health care provider before continuing with normal exercises.  Try to avoid standing for long periods of time. Move your legs often if you must stand in one place for a long time.  Avoid heavy lifting.  Wear low-heeled shoes and practice good posture.  You may continue to have sex unless your health care provider tells you not to. Relieving pain and discomfort  Wear a good support bra to relieve breast tenderness.  Take warm sitz baths to soothe any pain or discomfort caused by hemorrhoids. Use hemorrhoid cream if your health care provider approves.  Rest with your legs elevated if you have leg cramps or low back pain.  If you develop   varicose veins in your legs, wear support hose. Elevate your feet for 15 minutes, 3-4 times a day. Limit salt in your diet. Prenatal care  Schedule your prenatal visits by the twelfth week of pregnancy. They are usually scheduled monthly at first, then more often in the last 2 months before delivery.  Write down your questions. Take them to your prenatal visits.  Keep all your prenatal visits as told by your health care provider. This is important. Safety  Wear your seat belt at all times when driving.  Make a list of emergency phone numbers, including numbers for family, friends, the hospital, and police and fire departments. General instructions  Ask your health care provider for a referral to a local prenatal education class. Begin classes no later than the beginning of month 6 of your pregnancy.  Ask for help if you have counseling or nutritional needs during pregnancy. Your health care provider can offer advice or refer you to specialists for help  with various needs.  Do not use hot tubs, steam rooms, or saunas.  Do not douche or use tampons or scented sanitary pads.  Do not cross your legs for long periods of time.  Avoid cat litter boxes and soil used by cats. These carry germs that can cause birth defects in the baby and possibly loss of the fetus by miscarriage or stillbirth.  Avoid all smoking, herbs, alcohol, and medicines not prescribed by your health care provider. Chemicals in these products affect the formation and growth of the baby.  Do not use any products that contain nicotine or tobacco, such as cigarettes and e-cigarettes. If you need help quitting, ask your health care provider. You may receive counseling support and other resources to help you quit.  Schedule a dentist appointment. At home, brush your teeth with a soft toothbrush and be gentle when you floss. Contact a health care provider if:  You have dizziness.  You have mild pelvic cramps, pelvic pressure, or nagging pain in the abdominal area.  You have persistent nausea, vomiting, or diarrhea.  You have a bad smelling vaginal discharge.  You have pain when you urinate.  You notice increased swelling in your face, hands, legs, or ankles.  You are exposed to fifth disease or chickenpox.  You are exposed to German measles (rubella) and have never had it. Get help right away if:  You have a fever.  You are leaking fluid from your vagina.  You have spotting or bleeding from your vagina.  You have severe abdominal cramping or pain.  You have rapid weight gain or loss.  You vomit blood or material that looks like coffee grounds.  You develop a severe headache.  You have shortness of breath.  You have any kind of trauma, such as from a fall or a car accident. Summary  The first trimester of pregnancy is from week 1 until the end of week 13 (months 1 through 3).  Your body goes through many changes during pregnancy. The changes vary from  woman to woman.  You will have routine prenatal visits. During those visits, your health care provider will examine you, discuss any test results you may have, and talk with you about how you are feeling. This information is not intended to replace advice given to you by your health care provider. Make sure you discuss any questions you have with your health care provider. Document Released: 12/03/2001 Document Revised: 11/20/2016 Document Reviewed: 11/20/2016 Elsevier Interactive Patient Education  2018 [employer]  

## 2018-04-07 NOTE — Progress Notes (Signed)
Duplicate note in error

## 2018-04-07 NOTE — Progress Notes (Signed)
Subjective:    [name] is a [id number] 9039w1d being seen today for her first obstetrical visit.  Her obstetrical history is significant for advanced maternal age, pre-eclampsia and preterm delivery last pregnancy. Patient does intend to breast feed. Pregnancy history fully reviewed.  Patient reports no complaints.  Vitals:   04/07/18 0955  BP: 119/82  Pulse: 94  Temp: 97.7 F (36.5 C)  Weight: 158 lb 6.4 oz (71.8 kg)    HISTORY: OB History  Gravida Para Term Preterm AB Living  6 5 4 1   5   SAB TAB Ectopic Multiple Live Births        0 5    # Outcome Date GA Lbr Len/2nd Weight Sex Delivery Anes PTL Lv  6 Current           5 Preterm 01/13/16 4661w2d 05:59 / 00:04 5 lb 1.5 oz (2.31 kg) M Vag-Spont None  LIV  4 Term 01/10/14 1376w1d 09:00 / 04:13 7 lb 11.3 oz (3.496 kg) M Vag-Spont EPI  LIV     Birth Comments: Mom 37 y/o G4P4. Labs neg. Gestational HTN on [place]. Baby 37 w, NSVD, L hydronephrosis which was normal at 36 w U/S. Had phototherapy due to 10 bili at 24 hrs. Remained at 10. Requested repeat today.  Mom O+/ baby O+.   3 Term 01/10/06 3137w0d  7 lb 8 oz (3.402 kg) F Vag-Spont EPI  LIV  2 Term 09/02/01 7937w0d  9 lb 2 oz (4.139 kg) M Vag-Spont EPI  LIV  1 Term 01/24/97 4537w0d  6 lb 4 oz (2.835 kg) F Vag-Spont EPI  LIV   Past Medical History:  Diagnosis Date  . Hypertension   . Kidney stones   . Ovarian cyst   . Pregnancy induced hypertension   . Pregnant 08/09/2015   Past Surgical History:  Procedure Laterality Date  . APPENDECTOMY    . HEMORRHOID SURGERY N/A 01/19/2016   Procedure: EXTENSIVE HEMORRHOIDECTOMY;  Surgeon: Franky MachoMark Jenkins, MD;  Location: [facility];  Service: General;  Laterality: N/A;  . kidney stents    . LITHOTRIPSY     Family History  Problem Relation Age of Onset  . Cancer Mother        breast  . Diabetes Maternal Grandmother      Exam    Uterus:  Fundal Height: 22 cm  Pelvic Exam:    Perineum: No Hemorrhoids   Vulva: normal   Vagina:  normal mucosa   pH:     Cervix: no lesions   Adnexa: not evaluated   Bony Pelvis: gynecoid  System: Breast:  normal appearance, no masses or tenderness   Skin: normal coloration and turgor, no rashes    Neurologic: oriented, normal mood   Extremities: normal strength, tone, and muscle mass   HEENT PERRLA, neck supple with midline trachea and thyroid without masses   Mouth/Teeth mucous membranes moist, pharynx normal without lesions and dental hygiene good   Neck supple and no masses   Cardiovascular: regular rate and rhythm, no murmurs or gallops   Respiratory:  appears well, vitals normal, no respiratory distress, acyanotic, normal RR, neck free of mass or lymphadenopathy, chest clear, no wheezing, crepitations, rhonchi, normal symmetric air entry   Abdomen: gravid   Urinary: urethral meatus normal      Assessment:    Pregnancy: A5W0981G6P4105 Patient Active Problem List   Diagnosis Date Noted  . Supervision of other normal pregnancy, antepartum 04/07/2018  . Advanced maternal age in multigravida 04/07/2018  .  Elevated BP   . Prediabetes   . HLD (hyperlipidemia)   . Numbness   . TIA (transient ischemic attack) 07/30/2016  . Pain in the chest 07/30/2016  . Acute left-sided weakness 07/30/2016  . Thrombosed external hemorrhoids 01/18/2016  . Cocaine use 01/16/2016  . Preterm delivery, delivered 01/14/2016  . Abdominal pain 09/11/2015  . Rt flank pain 09/11/2015  . Hypokalemia 09/11/2015  . Leukocytosis 09/11/2015  . Anemia 09/11/2015  . Pyelonephritis 09/10/2015  . Right ovarian cyst 08/30/2015  . HTN (hypertension) 01/09/2014        Plan:     Initial labs drawn. Prenatal vitamins. Problem list reviewed and updated. Genetic Screening discussed ordered.  Ultrasound discussed; fetal survey: ordered.  Follow up in 2 weeks. 50% of 30 min visit spent on counseling and coordination of care.    [name] 04/07/2018

## 2018-04-08 LAB — CERVICOVAGINAL ANCILLARY ONLY
BACTERIAL VAGINITIS: POSITIVE — AB
Candida vaginitis: NEGATIVE
Chlamydia: NEGATIVE
NEISSERIA GONORRHEA: NEGATIVE
TRICH (WINDOWPATH): NEGATIVE

## 2018-04-09 LAB — CULTURE, OB URINE

## 2018-04-09 LAB — URINE CULTURE, OB REFLEX

## 2018-04-10 LAB — OBSTETRIC PANEL, INCLUDING HIV
Antibody Screen: NEGATIVE
BASOS ABS: 0 10*3/uL (ref 0.0–0.2)
Basos: 0 %
EOS (ABSOLUTE): 0.1 10*3/uL (ref 0.0–0.4)
Eos: 1 %
HEMOGLOBIN: 11.4 g/dL (ref 11.1–15.9)
HIV Screen 4th Generation wRfx: NONREACTIVE
Hematocrit: 36.2 % (ref 34.0–46.6)
Hepatitis B Surface Ag: NEGATIVE
IMMATURE GRANS (ABS): 0 10*3/uL (ref 0.0–0.1)
IMMATURE GRANULOCYTES: 0 %
LYMPHS ABS: 1.7 10*3/uL (ref 0.7–3.1)
LYMPHS: 19 %
MCH: 26.5 pg — AB (ref 26.6–33.0)
MCHC: 31.5 g/dL (ref 31.5–35.7)
MCV: 84 fL (ref 79–97)
MONOS ABS: 0.5 10*3/uL (ref 0.1–0.9)
Monocytes: 6 %
NEUTROS PCT: 74 %
Neutrophils Absolute: 6.5 10*3/uL (ref 1.4–7.0)
PLATELETS: 312 10*3/uL (ref 150–379)
RBC: 4.31 x10E6/uL (ref 3.77–5.28)
RDW: 15.5 % — AB (ref 12.3–15.4)
RPR Ser Ql: NONREACTIVE
Rh Factor: POSITIVE
Rubella Antibodies, IGG: 1.66 index (ref >0.99)
WBC: 8.9 10*3/uL (ref 3.4–10.8)

## 2018-04-10 LAB — CYTOLOGY - PAP
DIAGNOSIS: NEGATIVE
HPV (WINDOPATH): NOT DETECTED

## 2018-04-10 LAB — HEMOGLOBINOPATHY EVALUATION
HEMOGLOBIN F QUANTITATION: 0 % (ref 0.0–2.0)
HGB C: 0 %
HGB S: 0 %
HGB VARIANT: 0 %
Hemoglobin A2 Quantitation: 2.1 % (ref 1.8–3.2)
Hgb A: 97.9 % (ref 96.4–98.8)

## 2018-04-14 ENCOUNTER — Other Ambulatory Visit: Payer: Self-pay | Admitting: Obstetrics & Gynecology

## 2018-04-14 ENCOUNTER — Ambulatory Visit (HOSPITAL_COMMUNITY)
Admission: RE | Admit: 2018-04-14 | Discharge: 2018-04-14 | Disposition: A | Discharge status: Home / Self Care | Payer: Medicaid Other | Source: Ambulatory Visit | Attending: Obstetrics & Gynecology | Admitting: Obstetrics & Gynecology

## 2018-04-14 DIAGNOSIS — O26841 Uterine size-date discrepancy, first trimester: Secondary | ICD-10-CM

## 2018-04-14 DIAGNOSIS — Z3480 Encounter for supervision of other normal pregnancy, unspecified trimester: Secondary | ICD-10-CM | POA: Diagnosis present

## 2018-04-14 DIAGNOSIS — Z3A11 11 weeks gestation of pregnancy: Secondary | ICD-10-CM | POA: Diagnosis not present

## 2018-04-14 DIAGNOSIS — O09521 Supervision of elderly multigravida, first trimester: Secondary | ICD-10-CM

## 2018-04-14 DIAGNOSIS — O09211 Supervision of pregnancy with history of pre-term labor, first trimester: Secondary | ICD-10-CM

## 2018-04-21 ENCOUNTER — Other Ambulatory Visit: Payer: Self-pay | Admitting: Obstetrics & Gynecology

## 2018-04-21 ENCOUNTER — Ambulatory Visit (INDEPENDENT_AMBULATORY_CARE_PROVIDER_SITE_OTHER): Payer: Self-pay | Admitting: Obstetrics & Gynecology

## 2018-04-21 ENCOUNTER — Other Ambulatory Visit: Payer: Self-pay

## 2018-04-21 VITALS — BP 126/73 | HR 89 | Wt 161.0 lb

## 2018-04-21 DIAGNOSIS — K645 Perianal venous thrombosis: Secondary | ICD-10-CM

## 2018-04-21 DIAGNOSIS — N760 Acute vaginitis: Principal | ICD-10-CM

## 2018-04-21 DIAGNOSIS — B9689 Other specified bacterial agents as the cause of diseases classified elsewhere: Secondary | ICD-10-CM

## 2018-04-21 DIAGNOSIS — Z3480 Encounter for supervision of other normal pregnancy, unspecified trimester: Secondary | ICD-10-CM

## 2018-04-21 DIAGNOSIS — N83201 Unspecified ovarian cyst, right side: Secondary | ICD-10-CM

## 2018-04-21 MED ORDER — PANTOPRAZOLE SODIUM 40 MG PO TBEC: 40.0000 mg | DELAYED_RELEASE_TABLET | Freq: Every day | ORAL | 6 refills | Status: DC

## 2018-04-21 MED ORDER — METRONIDAZOLE 500 MG PO TABS: 500.0000 mg | ORAL_TABLET | Freq: Two times a day (BID) | ORAL | 0 refills | Status: AC

## 2018-04-21 NOTE — Progress Notes (Signed)
   PRENATAL VISIT NOTE  Subjective:  [name] is a 37 y.o. Z6X0960 at [redacted]w[redacted]d being seen today for ongoing prenatal care.  She is currently monitored for the following issues for this high-risk pregnancy and has HTN (hypertension); Hypokalemia; Anemia; Cocaine use; TIA (transient ischemic attack); Acute left-sided weakness; Elevated BP; Prediabetes; HLD (hyperlipidemia); Supervision of other normal pregnancy, antepartum; and Advanced maternal age in multigravida on their problem list.  Patient reports heartburn and nausea.  Contractions: Not present. Vag. Bleeding: None.   . Denies leaking of fluid.   The following portions of the patient's history were reviewed and updated as appropriate: allergies, current medications, past family history, past medical history, past social history, past surgical history and problem list. Problem list updated.  Objective:   Vitals:   04/21/18 0927  BP: 126/73  Pulse: 89  Weight: 161 lb (73 kg)    Fetal Status: Fetal Heart Rate (bpm): 155         General:  Alert, oriented and cooperative. Patient is in no acute distress.  Skin: Skin is warm and dry. No rash noted.   Cardiovascular: Normal heart rate noted  Respiratory: Normal respiratory effort, no problems with respiration noted  Abdomen: Soft, gravid, appropriate for gestational age.  Pain/Pressure: Present     Pelvic: Cervical exam deferred        Extremities: Normal range of motion.  Edema: None  Mental Status: Normal mood and affect. Normal behavior. Normal judgment and thought content.   Assessment and Plan:  Pregnancy: A5W0981 at [redacted]w[redacted]d  1. Supervision of other normal pregnancy, antepartum reflux - pantoprazole (PROTONIX) 40 MG tablet; Take 1 tablet (40 mg total) by mouth daily.  Dispense: 30 tablet; Refill: 6  2. Right ovarian cyst resolved  3. Thrombosed external hemorrhoids Not present  Preterm labor symptoms and general obstetric precautions including but not limited  to vaginal bleeding, contractions, leaking of fluid and fetal movement were reviewed in detail with the patient. Please refer to After Visit Summary for other counseling recommendations.  Return in about 1 month (around 05/19/2018). Genetic tests reviewed and are normal. Sx c/w round ligament pain No future appointments.  [name], MD

## 2018-04-21 NOTE — Patient Instructions (Signed)
Second Trimester of Pregnancy The second trimester is from week 13 through week 28, month 4 through 6. This is often the time in pregnancy that you feel your best. Often times, morning sickness has lessened or quit. You may have more energy, and you may get hungry more often. Your unborn baby (fetus) is growing rapidly. At the end of the sixth month, he or she is about 9 inches long and weighs about 1 pounds. You will likely feel the baby move (quickening) between 18 and 20 weeks of pregnancy. Follow these instructions at home:  Avoid all smoking, herbs, and alcohol. Avoid drugs not approved by your doctor.  Do not use any tobacco products, including cigarettes, chewing tobacco, and electronic cigarettes. If you need help quitting, ask your doctor. You may get counseling or other support to help you quit.  Only take medicine as told by your doctor. Some medicines are safe and some are not during pregnancy.  Exercise only as told by your doctor. Stop exercising if you start having cramps.  Eat regular, healthy meals.  Wear a good support bra if your breasts are tender.  Do not use hot tubs, steam rooms, or saunas.  Wear your seat belt when driving.  Avoid raw meat, uncooked cheese, and liter boxes and soil used by cats.  Take your prenatal vitamins.  Take 1500-2000 milligrams of calcium daily starting at the 20th week of pregnancy until you deliver your baby.  Try taking medicine that helps you poop (stool softener) as needed, and if your doctor approves. Eat more fiber by eating fresh fruit, vegetables, and whole grains. Drink enough fluids to keep your pee (urine) clear or pale yellow.  Take warm water baths (sitz baths) to soothe pain or discomfort caused by hemorrhoids. Use hemorrhoid cream if your doctor approves.  If you have puffy, bulging veins (varicose veins), wear support hose. Raise (elevate) your feet for 15 minutes, 3-4 times a day. Limit salt in your diet.  Avoid heavy  lifting, wear low heals, and sit up straight.  Rest with your legs raised if you have leg cramps or low back pain.  Visit your dentist if you have not gone during your pregnancy. Use a soft toothbrush to brush your teeth. Be gentle when you floss.  You can have sex (intercourse) unless your doctor tells you not to.  Go to your doctor visits. Get help if:  You feel dizzy.  You have mild cramps or pressure in your lower belly (abdomen).  You have a nagging pain in your belly area.  You continue to feel sick to your stomach (nauseous), throw up (vomit), or have watery poop (diarrhea).  You have bad smelling fluid coming from your vagina.  You have pain with peeing (urination). Get help right away if:  You have a fever.  You are leaking fluid from your vagina.  You have spotting or bleeding from your vagina.  You have severe belly cramping or pain.  You lose or gain weight rapidly.  You have trouble catching your breath and have chest pain.  You notice sudden or extreme puffiness (swelling) of your face, hands, ankles, feet, or legs.  You have not felt the baby move in over an hour.  You have severe headaches that do not go away with medicine.  You have vision changes. This information is not intended to replace advice given to you by your health care provider. Make sure you discuss any questions you have with your health care   provider. Document Released: 03/05/2010 Document Revised: 05/16/2016 Document Reviewed: 02/09/2013 Elsevier Interactive Patient Education  2017 Elsevier Inc.  

## 2018-04-21 NOTE — Progress Notes (Signed)
C/o left pelvic pain 7/10 x 3 days

## 2018-04-28 ENCOUNTER — Telehealth: Payer: Self-pay

## 2018-04-28 NOTE — Telephone Encounter (Signed)
S/w pt and advised of safe OTC meds to take during pregnancy for a cough.

## 2018-05-19 ENCOUNTER — Encounter: Payer: Medicaid Other | Admitting: Obstetrics and Gynecology

## 2018-05-25 ENCOUNTER — Ambulatory Visit (INDEPENDENT_AMBULATORY_CARE_PROVIDER_SITE_OTHER): Payer: Medicaid Other | Admitting: Obstetrics and Gynecology

## 2018-05-25 ENCOUNTER — Encounter: Payer: Self-pay | Admitting: Obstetrics and Gynecology

## 2018-05-25 DIAGNOSIS — O09522 Supervision of elderly multigravida, second trimester: Secondary | ICD-10-CM

## 2018-05-25 DIAGNOSIS — Z3480 Encounter for supervision of other normal pregnancy, unspecified trimester: Secondary | ICD-10-CM

## 2018-05-25 MED ORDER — COMFORT FIT MATERNITY SUPP MED MISC: 0 refills | Status: DC

## 2018-05-25 NOTE — Progress Notes (Signed)
   PRENATAL VISIT NOTE  Subjective:  [name] is a 37 y.o. [id number] at 7241w3d being seen today for ongoing prenatal care.  She is currently monitored for the following issues for this high-risk pregnancy and has HTN (hypertension); Hypokalemia; Anemia; Cocaine use; TIA (transient ischemic attack); Acute left-sided weakness; Elevated BP; Prediabetes; HLD (hyperlipidemia); Supervision of other normal pregnancy, antepartum; and Advanced maternal age in multigravida on their problem list.  Patient reports right groin pain with ambulation.  Contractions: Not present. Vag. Bleeding: None.  Movement: Present. Denies leaking of fluid.   The following portions of the patient's history were reviewed and updated as appropriate: allergies, current medications, past family history, past medical history, past social history, past surgical history and problem list. Problem list updated.  Objective:   Vitals:   05/25/18 1125  BP: 128/75  Pulse: 99  Weight: 165 lb 11.2 oz (75.2 kg)    Fetal Status: Fetal Heart Rate (bpm): 162   Movement: Present     General:  Alert, oriented and cooperative. Patient is in no acute distress.  Skin: Skin is warm and dry. No rash noted.   Cardiovascular: Normal heart rate noted  Respiratory: Normal respiratory effort, no problems with respiration noted  Abdomen: Soft, gravid, appropriate for gestational age.  Pain/Pressure: Present     Pelvic: Cervical exam deferred        Extremities: Normal range of motion.  Edema: None  Mental Status: Normal mood and affect. Normal behavior. Normal judgment and thought content.   Assessment and Plan:  Pregnancy: [id number] at 2241w3d  1. Supervision of other normal pregnancy, antepartum Patient is doing well Rx maternity support belt provided to help relieve groin pain Anatomy ultrasound ordered AFP today - US MFM OB DETAIL +14 WK; Future - AFP, Serum, Open Spina Bifida  2. Multigravida of advanced maternal age in  second trimester Low risk NIPS - AFP, Serum, Open Spina Bifida  Preterm labor symptoms and general obstetric precautions including but not limited to vaginal bleeding, contractions, leaking of fluid and fetal movement were reviewed in detail with the patient. Please refer to After Visit Summary for other counseling recommendations.  Return in about 1 month (around 06/22/2018) for ROB.  No future appointments.  Catalina AntiguaPeggy Eliabeth Shoff, MD

## 2018-05-27 LAB — AFP, SERUM, OPEN SPINA BIFIDA
AFP MOM: 0.75
AFP VALUE AFPOSL: 27.2 ng/mL
Gest. Age on Collection Date: 17.3 weeks
MATERNAL AGE AT EDD: 38.6 a
OSBR Risk 1 IN: 10000
TEST RESULTS AFP: NEGATIVE
WEIGHT: 165 [lb_av]

## 2018-06-08 ENCOUNTER — Encounter (HOSPITAL_COMMUNITY): Payer: Self-pay

## 2018-06-15 ENCOUNTER — Other Ambulatory Visit: Payer: Self-pay | Admitting: Obstetrics and Gynecology

## 2018-06-15 ENCOUNTER — Encounter (HOSPITAL_COMMUNITY): Payer: Self-pay

## 2018-06-15 ENCOUNTER — Ambulatory Visit (HOSPITAL_COMMUNITY)
Admission: RE | Admit: 2018-06-15 | Discharge: 2018-06-15 | Disposition: A | Discharge status: Home / Self Care | Payer: Medicaid Other | Source: Ambulatory Visit | Attending: Obstetrics and Gynecology | Admitting: Obstetrics and Gynecology

## 2018-06-15 DIAGNOSIS — O09212 Supervision of pregnancy with history of pre-term labor, second trimester: Secondary | ICD-10-CM | POA: Diagnosis present

## 2018-06-15 DIAGNOSIS — O09522 Supervision of elderly multigravida, second trimester: Secondary | ICD-10-CM | POA: Insufficient documentation

## 2018-06-15 DIAGNOSIS — O09292 Supervision of pregnancy with other poor reproductive or obstetric history, second trimester: Secondary | ICD-10-CM | POA: Diagnosis not present

## 2018-06-15 DIAGNOSIS — Z3A20 20 weeks gestation of pregnancy: Secondary | ICD-10-CM | POA: Insufficient documentation

## 2018-06-15 DIAGNOSIS — Z3480 Encounter for supervision of other normal pregnancy, unspecified trimester: Secondary | ICD-10-CM

## 2018-06-23 ENCOUNTER — Encounter: Payer: Medicaid Other | Admitting: Certified Nurse Midwife

## 2018-06-24 ENCOUNTER — Other Ambulatory Visit (HOSPITAL_COMMUNITY): Payer: Self-pay | Admitting: *Deleted

## 2018-06-24 DIAGNOSIS — O10919 Unspecified pre-existing hypertension complicating pregnancy, unspecified trimester: Secondary | ICD-10-CM

## 2018-06-30 ENCOUNTER — Other Ambulatory Visit (HOSPITAL_COMMUNITY)
Admission: RE | Admit: 2018-06-30 | Discharge: 2018-06-30 | Disposition: A | Discharge status: Home / Self Care | Payer: Medicaid Other | Source: Ambulatory Visit | Attending: Certified Nurse Midwife | Admitting: Certified Nurse Midwife

## 2018-06-30 ENCOUNTER — Encounter: Payer: Self-pay | Admitting: *Deleted

## 2018-06-30 ENCOUNTER — Encounter: Payer: Self-pay | Admitting: Certified Nurse Midwife

## 2018-06-30 ENCOUNTER — Ambulatory Visit (INDEPENDENT_AMBULATORY_CARE_PROVIDER_SITE_OTHER): Payer: Medicaid Other | Admitting: Certified Nurse Midwife

## 2018-06-30 VITALS — BP 137/81 | HR 102 | Wt 169.0 lb

## 2018-06-30 DIAGNOSIS — R102 Pelvic and perineal pain: Secondary | ICD-10-CM

## 2018-06-30 DIAGNOSIS — O219 Vomiting of pregnancy, unspecified: Secondary | ICD-10-CM

## 2018-06-30 DIAGNOSIS — O9989 Other specified diseases and conditions complicating pregnancy, childbirth and the puerperium: Secondary | ICD-10-CM

## 2018-06-30 DIAGNOSIS — O09212 Supervision of pregnancy with history of pre-term labor, second trimester: Secondary | ICD-10-CM

## 2018-06-30 DIAGNOSIS — O2342 Unspecified infection of urinary tract in pregnancy, second trimester: Secondary | ICD-10-CM | POA: Diagnosis present

## 2018-06-30 DIAGNOSIS — O09522 Supervision of elderly multigravida, second trimester: Secondary | ICD-10-CM

## 2018-06-30 DIAGNOSIS — O26899 Other specified pregnancy related conditions, unspecified trimester: Secondary | ICD-10-CM | POA: Insufficient documentation

## 2018-06-30 DIAGNOSIS — N898 Other specified noninflammatory disorders of vagina: Secondary | ICD-10-CM

## 2018-06-30 DIAGNOSIS — O09892 Supervision of other high risk pregnancies, second trimester: Secondary | ICD-10-CM | POA: Insufficient documentation

## 2018-06-30 DIAGNOSIS — O0990 Supervision of high risk pregnancy, unspecified, unspecified trimester: Secondary | ICD-10-CM

## 2018-06-30 DIAGNOSIS — I10 Essential (primary) hypertension: Secondary | ICD-10-CM

## 2018-06-30 DIAGNOSIS — M549 Dorsalgia, unspecified: Secondary | ICD-10-CM

## 2018-06-30 HISTORY — DX: Supervision of other high risk pregnancies, second trimester: O09.892

## 2018-06-30 LAB — POCT URINALYSIS DIPSTICK
Bilirubin, UA: NEGATIVE
GLUCOSE UA: NEGATIVE
Ketones, UA: NEGATIVE
LEUKOCYTES UA: NEGATIVE
NITRITE UA: NEGATIVE
PROTEIN UA: POSITIVE — AB
Spec Grav, UA: 1.01 (ref 1.010–1.025)
Urobilinogen, UA: 0.2 E.U./dL
pH, UA: 6 (ref 5.0–8.0)

## 2018-06-30 MED ORDER — NITROFURANTOIN MONOHYD MACRO 100 MG PO CAPS: 100.0000 mg | ORAL_CAPSULE | Freq: Two times a day (BID) | ORAL | 0 refills | Status: DC

## 2018-06-30 MED ORDER — COMFORT FIT MATERNITY SUPP LG MISC: 1.0000 [IU] | Freq: Every day | 0 refills | Status: DC

## 2018-06-30 MED ORDER — DOXYLAMINE-PYRIDOXINE 10-10 MG PO TBEC: DELAYED_RELEASE_TABLET | ORAL | 4 refills | Status: DC

## 2018-06-30 MED ORDER — PROMETHAZINE HCL 12.5 MG PO TABS: 12.5000 mg | ORAL_TABLET | Freq: Four times a day (QID) | ORAL | 0 refills | Status: DC | PRN

## 2018-06-30 MED ORDER — PHENAZOPYRIDINE HCL 200 MG PO TABS: 200.0000 mg | ORAL_TABLET | Freq: Three times a day (TID) | ORAL | 0 refills | Status: DC | PRN

## 2018-06-30 MED ORDER — HYDROXYPROGESTERONE CAPROATE 275 MG/1.1ML ~~LOC~~ SOAJ
275.0000 mg | SUBCUTANEOUS | Status: DC
  Administered 2018-06-30 – 2018-09-21 (×9): 275 mg via SUBCUTANEOUS

## 2018-06-30 NOTE — Progress Notes (Signed)
PRENATAL VISIT NOTE  Subjective:  [name] is a 37 y.o. [id number] at [redacted]w[redacted]d being seen today for ongoing prenatal care.  She is currently monitored for the following issues for this high-risk pregnancy and has HTN (hypertension); Hypokalemia; Anemia; Cocaine use; TIA (transient ischemic attack); Acute left-sided weakness; Elevated BP; Prediabetes; HLD (hyperlipidemia); Supervision of high risk pregnancy, antepartum; and Advanced maternal age in multigravida on their problem list.  Patient reports backache, no bleeding, no leaking, occasional contractions and vaginal irritation.   .  .   . Denies leaking of fluid.   The following portions of the patient's history were reviewed and updated as appropriate: allergies, current medications, past family history, past medical history, past social history, past surgical history and problem list. Problem list updated.  Objective:   Vitals:   06/30/18 1116  BP: 137/81  Pulse: (!) 102  Weight: 169 lb (76.7 kg)    Fetal Status: Fetal Heart Rate (bpm): 156; doppler Fundal Height: 30 cm       General:  Alert, oriented and cooperative. Patient is in no acute distress.  Skin: Skin is warm and dry. No rash noted.   Cardiovascular: Normal heart rate noted  Respiratory: Normal respiratory effort, no problems with respiration noted  Abdomen: Soft, gravid, appropriate for gestational age.        Pelvic: Cervical exam performed Dilation: Closed Effacement (%): 60 Station: Ballotable, soft, anterior  Extremities: Normal range of motion.     Mental Status: Normal mood and affect. Normal behavior. Normal judgment and thought content.   Assessment and Plan:  Pregnancy: [id number] at [redacted]w[redacted]d  1. Essential hypertension     CHTN per history, normal ECHO in 2017  2. Multigravida of advanced maternal age in second trimester    <40 years  3. Supervision of high risk pregnancy, antepartum     Hx of preterm delivery noted today: started on 17-P  injections.  Has f/u anatomy US scheduled for 07/13/18.  Fetal echocardiogram performed 06/29/18 for ?VSD: normal per patient report: report not in Epic yet.  - POCT urinalysis dipstick - Culture, OB Urine  4. UTI (urinary tract infection) during pregnancy, second trimester      - nitrofurantoin, macrocrystal-monohydrate, (MACROBID) 100 MG capsule; Take 1 capsule (100 mg total) by mouth 2 (two) times daily.  Dispense: 14 capsule; Refill: 0 - phenazopyridine (PYRIDIUM) 200 MG tablet; Take 1 tablet (200 mg total) by mouth 3 (three) times daily as needed for pain.  Dispense: 30 tablet; Refill: 0 - Culture, OB Urine  5. Nausea/vomiting in pregnancy    - promethazine (PHENERGAN) 12.5 MG tablet; Take 1 tablet (12.5 mg total) by mouth every 6 (six) hours as needed for nausea or vomiting.  Dispense: 30 tablet; Refill: 0 - Doxylamine-Pyridoxine (DICLEGIS) 10-10 MG TBEC; Take 1 tablet with breakfast and lunch.  Take 2 tablets at bedtime.  Dispense: 100 tablet; Refill: 4  6. History of preterm delivery, currently pregnant in second trimester    - [place] MFM OB Transvaginal; Future  7. Back pain affecting pregnancy in second trimester     Homeopathic remedies discussed - Elastic Bandages & Supports (COMFORT FIT MATERNITY SUPP LG) MISC; 1 Units by Does not apply route daily.  Dispense: 1 each; Refill: 0  8. Pain of round ligament during pregnancy     - Elastic Bandages & Supports (COMFORT FIT MATERNITY SUPP LG) MISC; 1 Units by Does not apply route daily.  Dispense: 1 each; Refill: 0  Preterm labor symptoms  and general obstetric precautions including but not limited to vaginal bleeding, contractions, leaking of fluid and fetal movement were reviewed in detail with the patient. Please refer to After Visit Summary for other counseling recommendations.  [facility]around [facility]28[facility]2018[facility] Needs to see FP MD here, 17-P weekly.  Future Appointments  Date Time Provider Department Center    07/07/2018  8:30 AM [facility] NURSE [facility] None  07/13/2018  8:15 AM [facility] US 4 [facility]  07/14/2018  8:30 AM [facility] NURSE [facility] None    Roe Coombsachelle A Sarae Nicholes, CNM

## 2018-07-01 LAB — CERVICOVAGINAL ANCILLARY ONLY
Bacterial vaginitis: NEGATIVE
CANDIDA VAGINITIS: POSITIVE — AB
Chlamydia: NEGATIVE
Neisseria Gonorrhea: NEGATIVE
TRICH (WINDOWPATH): NEGATIVE

## 2018-07-02 ENCOUNTER — Other Ambulatory Visit: Payer: Self-pay | Admitting: Certified Nurse Midwife

## 2018-07-02 DIAGNOSIS — B373 Candidiasis of vulva and vagina: Secondary | ICD-10-CM

## 2018-07-02 DIAGNOSIS — B3731 Acute candidiasis of vulva and vagina: Secondary | ICD-10-CM

## 2018-07-02 LAB — CULTURE, OB URINE

## 2018-07-02 LAB — URINE CULTURE, OB REFLEX

## 2018-07-02 MED ORDER — FLUCONAZOLE 150 MG PO TABS: 150.0000 mg | ORAL_TABLET | Freq: Once | ORAL | 0 refills | Status: AC

## 2018-07-02 MED ORDER — TERCONAZOLE 0.8 % VA CREA: 1.0000 | TOPICAL_CREAM | Freq: Every day | VAGINAL | 0 refills | Status: DC

## 2018-07-04 ENCOUNTER — Inpatient Hospital Stay (HOSPITAL_COMMUNITY)
Admission: AD | Admit: 2018-07-04 | Discharge: 2018-07-04 | Disposition: A | Discharge status: Home / Self Care | Payer: Medicaid Other | Source: Ambulatory Visit | Attending: Obstetrics & Gynecology | Admitting: Obstetrics & Gynecology

## 2018-07-04 ENCOUNTER — Encounter (HOSPITAL_COMMUNITY): Payer: Self-pay | Admitting: *Deleted

## 2018-07-04 DIAGNOSIS — K219 Gastro-esophageal reflux disease without esophagitis: Secondary | ICD-10-CM | POA: Diagnosis not present

## 2018-07-04 DIAGNOSIS — O99612 Diseases of the digestive system complicating pregnancy, second trimester: Secondary | ICD-10-CM | POA: Diagnosis not present

## 2018-07-04 DIAGNOSIS — Z833 Family history of diabetes mellitus: Secondary | ICD-10-CM | POA: Insufficient documentation

## 2018-07-04 DIAGNOSIS — R109 Unspecified abdominal pain: Secondary | ICD-10-CM | POA: Diagnosis not present

## 2018-07-04 DIAGNOSIS — O26832 Pregnancy related renal disease, second trimester: Secondary | ICD-10-CM

## 2018-07-04 DIAGNOSIS — Z9889 Other specified postprocedural states: Secondary | ICD-10-CM | POA: Insufficient documentation

## 2018-07-04 DIAGNOSIS — Z3A23 23 weeks gestation of pregnancy: Secondary | ICD-10-CM | POA: Insufficient documentation

## 2018-07-04 DIAGNOSIS — N200 Calculus of kidney: Secondary | ICD-10-CM | POA: Insufficient documentation

## 2018-07-04 DIAGNOSIS — Z87442 Personal history of urinary calculi: Secondary | ICD-10-CM | POA: Diagnosis not present

## 2018-07-04 DIAGNOSIS — O26892 Other specified pregnancy related conditions, second trimester: Secondary | ICD-10-CM | POA: Insufficient documentation

## 2018-07-04 DIAGNOSIS — O162 Unspecified maternal hypertension, second trimester: Secondary | ICD-10-CM | POA: Insufficient documentation

## 2018-07-04 DIAGNOSIS — O219 Vomiting of pregnancy, unspecified: Secondary | ICD-10-CM

## 2018-07-04 DIAGNOSIS — Z803 Family history of malignant neoplasm of breast: Secondary | ICD-10-CM | POA: Diagnosis not present

## 2018-07-04 HISTORY — DX: Gastro-esophageal reflux disease without esophagitis: K21.9

## 2018-07-04 LAB — CBC WITH DIFFERENTIAL/PLATELET
BASOS PCT: 0 %
Basophils Absolute: 0 10*3/uL (ref 0.0–0.1)
EOS ABS: 0.2 10*3/uL (ref 0.0–0.7)
Eosinophils Relative: 2 %
HCT: 28 % — ABNORMAL LOW (ref 36.0–46.0)
HEMOGLOBIN: 9 g/dL — AB (ref 12.0–15.0)
Lymphocytes Relative: 20 %
Lymphs Abs: 2.4 10*3/uL (ref 0.7–4.0)
MCH: 26.9 pg (ref 26.0–34.0)
MCHC: 32.1 g/dL (ref 30.0–36.0)
MCV: 83.6 fL (ref 78.0–100.0)
Monocytes Absolute: 0.9 10*3/uL (ref 0.1–1.0)
Monocytes Relative: 7 %
NEUTROS PCT: 71 %
Neutro Abs: 8.5 10*3/uL (ref 1.7–7.7)
Platelets: 280 10*3/uL (ref 150–400)
RBC: 3.35 MIL/uL — AB (ref 3.87–5.11)
RDW: 14.5 % (ref 11.5–15.5)
WBC: 12 10*3/uL — AB (ref 4.0–10.5)

## 2018-07-04 LAB — URINALYSIS, ROUTINE W REFLEX MICROSCOPIC
BILIRUBIN URINE: NEGATIVE
Glucose, UA: NEGATIVE mg/dL
Hgb urine dipstick: NEGATIVE
KETONES UR: NEGATIVE mg/dL
LEUKOCYTES UA: NEGATIVE
Nitrite: POSITIVE — AB
Protein, ur: NEGATIVE mg/dL
SPECIFIC GRAVITY, URINE: 1.006 (ref 1.005–1.030)
pH: 7 (ref 5.0–8.0)

## 2018-07-04 LAB — COMPREHENSIVE METABOLIC PANEL
ALBUMIN: 3 g/dL — AB (ref 3.5–5.0)
ALK PHOS: 65 U/L (ref 38–126)
ALT: 11 U/L (ref 0–44)
AST: 17 U/L (ref 15–41)
Anion gap: 11 (ref 5–15)
BUN: 7 mg/dL (ref 6–20)
CO2: 23 mmol/L (ref 22–32)
Calcium: 10.4 mg/dL — ABNORMAL HIGH (ref 8.9–10.3)
Chloride: 101 mmol/L (ref 98–111)
Creatinine, Ser: 0.68 mg/dL (ref 0.44–1.00)
GFR calc Af Amer: >60 mL/min (ref >60)
GFR calc non Af Amer: >60 mL/min (ref >60)
Glucose, Bld: 103 mg/dL — ABNORMAL HIGH (ref 70–99)
POTASSIUM: 3.4 mmol/L — AB (ref 3.5–5.1)
SODIUM: 135 mmol/L (ref 135–145)
Total Bilirubin: 0.5 mg/dL (ref 0.3–1.2)
Total Protein: 6.4 g/dL — ABNORMAL LOW (ref 6.5–8.1)

## 2018-07-04 LAB — LIPASE, BLOOD: Lipase: 63 U/L — ABNORMAL HIGH (ref 11–51)

## 2018-07-04 MED ORDER — OXYCODONE-ACETAMINOPHEN 5-325 MG PO TABS: 1.0000 | ORAL_TABLET | Freq: Four times a day (QID) | ORAL | 0 refills | Status: DC | PRN

## 2018-07-04 MED ORDER — LACTATED RINGERS IV BOLUS
1000.0000 mL | Freq: Once | INTRAVENOUS | Status: AC
  Administered 2018-07-04: 1000 mL via INTRAVENOUS

## 2018-07-04 MED ORDER — HYDROMORPHONE HCL 1 MG/ML IJ SOLN
0.5000 mg | Freq: Once | INTRAMUSCULAR | Status: AC
  Administered 2018-07-04: 0.5 mg via INTRAVENOUS
  Filled 2018-07-04: qty 1

## 2018-07-04 MED ORDER — PROMETHAZINE HCL 12.5 MG PO TABS: 12.5000 mg | ORAL_TABLET | Freq: Four times a day (QID) | ORAL | 0 refills | Status: DC | PRN

## 2018-07-04 MED ORDER — ONDANSETRON HCL 4 MG/2ML IJ SOLN
4.0000 mg | Freq: Once | INTRAMUSCULAR | Status: AC
  Administered 2018-07-04: 4 mg via INTRAVENOUS
  Filled 2018-07-04: qty 2

## 2018-07-04 NOTE — [facility] Provider Note (Signed)
History     CSN: [id number]  Arrival date and time: 07/04/18 1191   First Provider Initiated Contact with Patient 07/04/18 0216      Chief Complaint  Patient presents with  . Emesis  . Flank Pain  . pelvic pressure   HPI [name] is a 37 y.o. [id number] at [redacted]w[redacted]d who presents to [facility]U today with complaint of right flank pain since Tuesday that worsened significantly tonight. She has tried Tylenol for pain without relief. She states that after dinner tonight pain became much worse and caused N/V. She was seen in the office this week and started on Macrobid and Pyridium for a presumed UTI. Urine culture results now indicate no infection. She has noted blood in her urine. She denies fever, vaginal bleeding, LOF or contractions. She reports normal fetal movement. She has a history of kidney stones require stent placement in the past.   OB History    Gravida  6   Para  5   Term  4   Preterm  1   AB      Living  5     SAB      TAB      Ectopic      Multiple  0   Live Births  5           Past Medical History:  Diagnosis Date  . GERD (gastroesophageal reflux disease)   . Hypertension   . Kidney stones   . Ovarian cyst   . Pregnancy induced hypertension   . Pregnant 08/09/2015    Past Surgical History:  Procedure Laterality Date  . APPENDECTOMY    . HEMORRHOID SURGERY N/A 01/19/2016   Procedure: EXTENSIVE HEMORRHOIDECTOMY;  Surgeon: Franky Macho, MD;  Location: [facility];  Service: General;  Laterality: N/A;  . kidney stents    . LITHOTRIPSY      Family History  Problem Relation Age of Onset  . Cancer Mother        breast  . Diabetes Maternal Grandmother     Social History   Tobacco Use  . Smoking status: Never Smoker  . Smokeless tobacco: Never Used  Substance Use Topics  . Alcohol use: No  . Drug use: No    Allergies: No Known Allergies  No medications prior to admission.    Review of Systems  Constitutional: Negative for  fever.  Gastrointestinal: Positive for nausea and vomiting. Negative for abdominal pain, constipation and diarrhea.  Genitourinary: Positive for dysuria, flank pain and hematuria. Negative for frequency, urgency, vaginal bleeding and vaginal discharge.   Physical Exam   Blood pressure 135/73, pulse (!) 109, temperature 98.3 F (36.8 C), temperature source Oral, resp. rate 16, height 5\' 3"  (1.6 m), weight 170 lb (77.1 kg), last menstrual period 01/12/2018.  Physical Exam  Nursing note and vitals reviewed. Constitutional: She is oriented to person, place, and time. She appears well-developed and well-nourished. No distress.  HENT:  Head: Normocephalic and atraumatic.  Cardiovascular: Normal rate.  Respiratory: Effort normal.  GI: Soft. She exhibits no distension and no mass. There is no tenderness. There is CVA tenderness (right, mild to moderate). There is no rebound and no guarding.  Neurological: She is alert and oriented to person, place, and time.  Skin: Skin is warm and dry. No erythema.  Psychiatric: She has a normal mood and affect.   Results for orders placed or performed during the hospital encounter of 07/04/18 (from the past 24 hour(s))  Urinalysis,  Routine w reflex microscopic     Status: Abnormal   Collection Time: 07/04/18  1:55 AM  Result Value Ref Range   Color, Urine AMBER (A) YELLOW   APPearance CLEAR CLEAR   Specific Gravity, Urine 1.006 1.005 - 1.030   pH 7.0 5.0 - 8.0   Glucose, UA NEGATIVE NEGATIVE mg/dL   Hgb urine dipstick NEGATIVE NEGATIVE   Bilirubin Urine NEGATIVE NEGATIVE   Ketones, ur NEGATIVE NEGATIVE mg/dL   Protein, ur NEGATIVE NEGATIVE mg/dL   Nitrite POSITIVE (A) NEGATIVE   Leukocytes, UA NEGATIVE NEGATIVE   RBC / HPF 0-5 0 - 5 RBC/hpf   WBC, UA 0-5 0 - 5 WBC/hpf   Bacteria, UA RARE (A) NONE SEEN   Squamous Epithelial / LPF 0-5 0 - 5   Mucus PRESENT   CBC with Differential/Platelet     Status: Abnormal   Collection Time: 07/04/18  2:13 AM   Result Value Ref Range   WBC 12.0 (H) 4.0 - 10.5 K/uL   RBC 3.35 (L) 3.87 - 5.11 MIL/uL   Hemoglobin 9.0 (L) 12.0 - 15.0 g/dL   HCT 40.928.0 (L) 81.136.0 - 91.446.0 %   MCV 83.6 78.0 - 100.0 fL   MCH 26.9 26.0 - 34.0 pg   MCHC 32.1 30.0 - 36.0 g/dL   RDW 78.214.5 95.611.5 - 21.315.5 %   Platelets 280 150 - 400 K/uL   Neutrophils Relative % 71 %   Neutro Abs 8.5 1.7 - 7.7 K/uL   Lymphocytes Relative 20 %   Lymphs Abs 2.4 0.7 - 4.0 K/uL   Monocytes Relative 7 %   Monocytes Absolute 0.9 0.1 - 1.0 K/uL   Eosinophils Relative 2 %   Eosinophils Absolute 0.2 0.0 - 0.7 K/uL   Basophils Relative 0 %   Basophils Absolute 0.0 0.0 - 0.1 K/uL  Comprehensive metabolic panel     Status: Abnormal   Collection Time: 07/04/18  2:13 AM  Result Value Ref Range   Sodium 135 135 - 145 mmol/L   Potassium 3.4 (L) 3.5 - 5.1 mmol/L   Chloride 101 98 - 111 mmol/L   CO2 23 22 - 32 mmol/L   Glucose, Bld 103 (H) 70 - 99 mg/dL   BUN 7 6 - 20 mg/dL   Creatinine, Ser 0.860.68 0.44 - 1.00 mg/dL   Calcium 57.810.4 (H) 8.9 - 10.3 mg/dL   Total Protein 6.4 (L) 6.5 - 8.1 g/dL   Albumin 3.0 (L) 3.5 - 5.0 g/dL   AST 17 15 - 41 U/L   ALT 11 0 - 44 U/L   Alkaline Phosphatase 65 38 - 126 U/L   Total Bilirubin 0.5 0.3 - 1.2 mg/dL   GFR calc non Af Amer >60 >60 mL/min   GFR calc Af Amer >60 >60 mL/min   Anion gap 11 5 - 15  Lipase, blood     Status: Abnormal   Collection Time: 07/04/18  2:13 AM  Result Value Ref Range   Lipase 63 (H) 11 - 51 U/L    Fetal Monitoring: Baseline: 150 bpm Variability: moderate Accelerations: 10 x 10 Decelerations: none Contractions: none   MAU Course  Procedures None  MDM UA, CBC, CMP, Lipase today  IV LR bolus with 4 mg Zofran and 0.5 mg Dilaudid given  Discussed patient with Dr. Despina HiddenEure. Consistent with kidney stone, no evidence of pyelonephritis. Recommends Rx for Phenergan and Percocet.  Assessment and Plan  A: [facility] at 8541w1d Nephrolithiasis   P: Discharge home Rx for  Percocet and Phenergan  given to patient  Warning signs for worsening condition discussed Patient advised to follow-up with [facility] as scheduled for routine prenatal care or sooner PRN Patient may return to [facility]U as needed or if her condition were to change or worsen  Vonzella Nipple, PA-C 07/04/2018, 4:23 AM

## 2018-07-04 NOTE — MAU Note (Addendum)
Pain R flank that radiates around to front. Pain causes pressure in pelvis. Denies LOF or bleeding. Pain present since Tues and progressively worse. Some pain with urination

## 2018-07-04 NOTE — MAU Note (Signed)
Pain R flank that radiates around to front. Pain causes pressure in pelvis. Denies LOF or bleeding. Pain present since Tues and progressively worse. Some pain with urination. Hx kidney stones withs stints in past

## 2018-07-04 NOTE — Discharge Instructions (Signed)

## 2018-07-05 LAB — CULTURE, OB URINE

## 2018-07-06 ENCOUNTER — Telehealth: Payer: Self-pay

## 2018-07-06 NOTE — Telephone Encounter (Signed)
Spoke with pt over the phone about pain she is having that she was seen at MAU over the weekend for. Advised her that she needs to go back to the hospital bc she stated pain is worse and percocet is not helping. Dr. Clearance CootsHarper agreed with this advise. Pt verbalized understanding.

## 2018-07-07 ENCOUNTER — Ambulatory Visit (INDEPENDENT_AMBULATORY_CARE_PROVIDER_SITE_OTHER): Payer: Medicaid Other

## 2018-07-07 ENCOUNTER — Encounter (HOSPITAL_COMMUNITY): Payer: Self-pay | Admitting: *Deleted

## 2018-07-07 ENCOUNTER — Inpatient Hospital Stay (HOSPITAL_COMMUNITY)
Admission: AD | Admit: 2018-07-07 | Discharge: 2018-07-07 | Disposition: A | Discharge status: Home / Self Care | Payer: Medicaid Other | Source: Ambulatory Visit | Attending: Obstetrics & Gynecology | Admitting: Obstetrics & Gynecology

## 2018-07-07 VITALS — BP 133/79 | HR 94 | Wt 173.6 lb

## 2018-07-07 DIAGNOSIS — Z79899 Other long term (current) drug therapy: Secondary | ICD-10-CM | POA: Insufficient documentation

## 2018-07-07 DIAGNOSIS — Z3A23 23 weeks gestation of pregnancy: Secondary | ICD-10-CM | POA: Diagnosis not present

## 2018-07-07 DIAGNOSIS — Z7982 Long term (current) use of aspirin: Secondary | ICD-10-CM | POA: Insufficient documentation

## 2018-07-07 DIAGNOSIS — Z79890 Hormone replacement therapy (postmenopausal): Secondary | ICD-10-CM | POA: Diagnosis not present

## 2018-07-07 DIAGNOSIS — O26892 Other specified pregnancy related conditions, second trimester: Secondary | ICD-10-CM | POA: Insufficient documentation

## 2018-07-07 DIAGNOSIS — N200 Calculus of kidney: Secondary | ICD-10-CM | POA: Diagnosis not present

## 2018-07-07 DIAGNOSIS — O212 Late vomiting of pregnancy: Secondary | ICD-10-CM | POA: Insufficient documentation

## 2018-07-07 DIAGNOSIS — O09892 Supervision of other high risk pregnancies, second trimester: Secondary | ICD-10-CM

## 2018-07-07 DIAGNOSIS — O9989 Other specified diseases and conditions complicating pregnancy, childbirth and the puerperium: Secondary | ICD-10-CM | POA: Diagnosis not present

## 2018-07-07 DIAGNOSIS — O09212 Supervision of pregnancy with history of pre-term labor, second trimester: Secondary | ICD-10-CM | POA: Diagnosis not present

## 2018-07-07 DIAGNOSIS — O0990 Supervision of high risk pregnancy, unspecified, unspecified trimester: Secondary | ICD-10-CM

## 2018-07-07 LAB — CBC
HCT: 27.3 % — ABNORMAL LOW (ref 36.0–46.0)
Hemoglobin: 8.8 g/dL — ABNORMAL LOW (ref 12.0–15.0)
MCH: 27.2 pg (ref 26.0–34.0)
MCHC: 32.2 g/dL (ref 30.0–36.0)
MCV: 84.3 fL (ref 78.0–100.0)
PLATELETS: 235 10*3/uL (ref 150–400)
RBC: 3.24 MIL/uL — ABNORMAL LOW (ref 3.87–5.11)
RDW: 14.8 % (ref 11.5–15.5)
WBC: 8.7 10*3/uL (ref 4.0–10.5)

## 2018-07-07 LAB — URINALYSIS, COMPLETE (UACMP) WITH MICROSCOPIC
Bilirubin Urine: NEGATIVE
GLUCOSE, UA: NEGATIVE mg/dL
HGB URINE DIPSTICK: NEGATIVE
Ketones, ur: NEGATIVE mg/dL
Leukocytes, UA: NEGATIVE
Nitrite: POSITIVE — AB
PROTEIN: NEGATIVE mg/dL
Specific Gravity, Urine: 1.008 (ref 1.005–1.030)
pH: 7 (ref 5.0–8.0)

## 2018-07-07 LAB — COMPREHENSIVE METABOLIC PANEL
ALBUMIN: 2.6 g/dL — AB (ref 3.5–5.0)
ALT: 11 U/L (ref 0–44)
ANION GAP: 11 (ref 5–15)
AST: 17 U/L (ref 15–41)
Alkaline Phosphatase: 54 U/L (ref 38–126)
BUN: 8 mg/dL (ref 6–20)
CHLORIDE: 101 mmol/L (ref 98–111)
CO2: 22 mmol/L (ref 22–32)
Calcium: 9.1 mg/dL (ref 8.9–10.3)
Creatinine, Ser: 0.63 mg/dL (ref 0.44–1.00)
GFR calc Af Amer: >60 mL/min (ref >60)
GLUCOSE: 106 mg/dL — AB (ref 70–99)
POTASSIUM: 3.4 mmol/L — AB (ref 3.5–5.1)
Sodium: 134 mmol/L — ABNORMAL LOW (ref 135–145)
Total Bilirubin: 0.4 mg/dL (ref 0.3–1.2)
Total Protein: 6.6 g/dL (ref 6.5–8.1)

## 2018-07-07 MED ORDER — KETOROLAC TROMETHAMINE 60 MG/2ML IM SOLN
60.0000 mg | Freq: Once | INTRAMUSCULAR | Status: AC
  Administered 2018-07-07: 60 mg via INTRAMUSCULAR
  Filled 2018-07-07: qty 2

## 2018-07-07 MED ORDER — OXYCODONE-ACETAMINOPHEN 5-325 MG PO TABS: 1.0000 | ORAL_TABLET | Freq: Four times a day (QID) | ORAL | 0 refills | Status: DC | PRN

## 2018-07-07 MED ORDER — SODIUM CHLORIDE 0.9 % IV SOLN
INTRAVENOUS | Status: DC
  Administered 2018-07-07: 12:00:00 via INTRAVENOUS

## 2018-07-07 MED ORDER — LACTATED RINGERS IV SOLN
INTRAVENOUS | Status: DC
  Administered 2018-07-07: 10:00:00 via INTRAVENOUS

## 2018-07-07 MED ORDER — IBUPROFEN 400 MG PO TABS: 400.0000 mg | ORAL_TABLET | Freq: Four times a day (QID) | ORAL | 0 refills | Status: DC | PRN

## 2018-07-07 MED ORDER — PROMETHAZINE HCL 25 MG RE SUPP: 25.0000 mg | Freq: Four times a day (QID) | RECTAL | 0 refills | Status: DC | PRN

## 2018-07-07 MED ORDER — TAMSULOSIN HCL 0.4 MG PO CAPS
0.4000 mg | ORAL_CAPSULE | Freq: Once | ORAL | Status: DC
  Filled 2018-07-07: qty 1

## 2018-07-07 MED ORDER — PROMETHAZINE HCL 25 MG/ML IJ SOLN
25.0000 mg | Freq: Once | INTRAMUSCULAR | Status: AC
Start: 2018-07-07 — End: 2018-07-07
  Administered 2018-07-07: 25 mg via INTRAVENOUS
  Filled 2018-07-07: qty 1

## 2018-07-07 MED ORDER — FERROUS FUMARATE 325 (106 FE) MG PO TABS: 1.0000 | ORAL_TABLET | Freq: Two times a day (BID) | ORAL | 0 refills | Status: DC

## 2018-07-07 MED ORDER — SODIUM CHLORIDE 0.9 % IV SOLN
510.0000 mg | Freq: Once | INTRAVENOUS | Status: AC
  Administered 2018-07-07: 510 mg via INTRAVENOUS
  Filled 2018-07-07: qty 17

## 2018-07-07 NOTE — MAU Provider Note (Addendum)
History     CSN: [id number]  Arrival date and time: 07/07/18 1478   First Provider Initiated Contact with Patient 07/07/18 [phone]      Chief Complaint  Patient presents with  . Back Pain  . Emesis During Pregnancy   [name] is a G/P:  [id number] GA:  [redacted]w[redacted]d female who presents today for recurrent right sided flank pain "like razors", N&V and LBP,  X3 days. Pt. Has a PHX nephrolithiasis with stents. Pt. States that pain is increases during mictuaton. She states there is no urethral pain but "more inside and fells like "it" starts to move down as she pees but then it get sucked back up.". Pt. Was seen in clinic 7/13 for similar sx.  Pt. Was D/c'd with strong suspicions kidney stones and given Phenergan and percocet for sx mangment. Pt. States that's that she tried the percocet's for 1 day " taking them every six hours" but "she stopped taking the percocet's because her pharmacist told her said if they don't work do not continue to take them". Pt endorses continuous intermittent vomiting and agrees, due to the vomiting she doesn't think that the medication has had time to work. Pt. Endorses that she has yet to try the phenergan tabs due to N&V. Pt. Confirms no decrease in fetal movement. Denies fevers, chills, vaginal bleeding or LOF.      OB History    Gravida  6   Para  5   Term  4   Preterm  1   AB      Living  5     SAB      TAB      Ectopic      Multiple  0   Live Births  5           Past Medical History:  Diagnosis Date  . GERD (gastroesophageal reflux disease)   . Hypertension   . Kidney stones   . Ovarian cyst   . Pregnancy induced hypertension   . Pregnant 08/09/2015    Past Surgical History:  Procedure Laterality Date  . APPENDECTOMY    . HEMORRHOID SURGERY N/A 01/19/2016   Procedure: EXTENSIVE HEMORRHOIDECTOMY;  Surgeon: Franky Macho, MD;  Location: [facility];  Service: General;  Laterality: N/A;  . kidney stents    . LITHOTRIPSY      Family  History  Problem Relation Age of Onset  . Cancer Mother        breast  . Diabetes Maternal Grandmother     Social History   Tobacco Use  . Smoking status: Never Smoker  . Smokeless tobacco: Never Used  Substance Use Topics  . Alcohol use: No  . Drug use: No    Allergies: No Known Allergies  Facility-Administered Medications Prior to Admission  Medication Dose Route Frequency Provider Last Rate Last Dose  . HYDROXYprogesterone Caproate SOAJ 275 mg  275 mg Subcutaneous Weekly Denney, Rachelle A, CNM   275 mg at 07/07/18 7846   Medications Prior to Admission  Medication Sig Dispense Refill Last Dose  . acetaminophen (TYLENOL) 325 MG tablet Take 650 mg by mouth every 6 (six) hours as needed.   Past Month at Unknown time  . aspirin EC 81 MG tablet Take 1 tablet (81 mg total) by mouth daily. 100 tablet 2 More than a month at Unknown time  . Doxylamine-Pyridoxine (DICLEGIS) 10-10 MG TBEC Take 1 tablet with breakfast and lunch.  Take 2 tablets at bedtime. 100 tablet 4 Past  Week at Unknown time  . Elastic Bandages & Supports (COMFORT FIT MATERNITY SUPP LG) MISC 1 Units by Does not apply route daily. 1 each 0   . nitrofurantoin, macrocrystal-monohydrate, (MACROBID) 100 MG capsule Take 1 capsule (100 mg total) by mouth 2 (two) times daily. 14 capsule 0 07/03/2018 at Unknown time  . Omega-3 Fatty Acids (FISH OIL) 1000 MG CAPS Take 1 capsule (1,000 mg total) by mouth 2 (two) times daily. 60 capsule 2 Past Month at Unknown time  . oxyCODONE-acetaminophen (PERCOCET/ROXICET) 5-325 MG tablet Take 1 tablet by mouth every 6 (six) hours as needed for severe pain. 20 tablet 0   . pantoprazole (PROTONIX) 40 MG tablet Take 1 tablet (40 mg total) by mouth daily. 30 tablet 6 07/03/2018 at Unknown time  . phenazopyridine (PYRIDIUM) 200 MG tablet Take 1 tablet (200 mg total) by mouth 3 (three) times daily as needed for pain. 30 tablet 0 Past Week at Unknown time  . Prenatal Vit-Fe Fumarate-FA (PRENATAL  MULTIVITAMIN) TABS tablet Take 1 tablet by mouth daily at 12 noon.   07/03/2018 at Unknown time  . promethazine (PHENERGAN) 12.5 MG tablet Take 1 tablet (12.5 mg total) by mouth every 6 (six) hours as needed for nausea or vomiting. 30 tablet 0   . terconazole (TERAZOL 3) 0.8 % vaginal cream Place 1 applicator vaginally at bedtime. 20 g 0 Unknown at Unknown time    Review of Systems  Constitutional: Negative for chills, diaphoresis, fatigue and fever.  Respiratory: Negative for shortness of breath.   Endocrine: Negative for polyuria.  Genitourinary: Positive for dysuria, flank pain and hematuria. Negative for difficulty urinating, frequency, urgency, vaginal bleeding, vaginal discharge and vaginal pain.  Skin: Negative for pallor.  Neurological: Negative for dizziness and headaches.   Physical Exam   Blood pressure 125/72, pulse 92, temperature 98.3 F (36.8 C), temperature source Oral, height 5\' 3"  (1.6 m), weight 174 lb (78.9 kg), last menstrual period 01/12/2018, SpO2 99 %.  Physical Exam  Constitutional: She is oriented to person, place, and time. She appears well-developed and well-nourished.  Eyes: Pupils are equal, round, and reactive to light. EOM are normal.  Cardiovascular: Normal rate.  Respiratory: Effort normal. She has no wheezes.  GI: Soft. There is tenderness. There is no rebound and no guarding.  Neurological: She is alert and oriented to person, place, and time.  Skin: Skin is warm and dry.    MAU Course  Procedures  MDM Fetal Monitoring: Reassuring FHR 151-155, Pt. Endorses regular fetal activity, neg. Vaginal bleeding or LOF CBC, CMP, UA,  Results for [name] (MRN [id number]) as of 07/07/2018 10:20  Ref. Range 07/07/2018 09:28  Appearance Latest Ref Range: CLEAR  HAZY (A)  Bilirubin Urine Latest Ref Range: NEGATIVE  NEGATIVE  Color, Urine Latest Ref Range: YELLOW  AMBER (A)  Glucose Latest Ref Range: NEGATIVE mg/dL NEGATIVE  Hgb urine dipstick  Latest Ref Range: NEGATIVE  NEGATIVE  Ketones, ur Latest Ref Range: NEGATIVE mg/dL NEGATIVE  Leukocytes, UA Latest Ref Range: NEGATIVE  NEGATIVE  Nitrite Latest Ref Range: NEGATIVE  POSITIVE (A)  pH Latest Ref Range: 5.0 - 8.0  7.0  Protein Latest Ref Range: NEGATIVE mg/dL NEGATIVE  Specific Gravity, Urine Latest Ref Range: 1.005 - 1.030  1.008  Bacteria, UA Latest Ref Range: NONE SEEN  MANY (A)  Mucus Unknown PRESENT  RBC / HPF Latest Ref Range: 0 - 5 RBC/hpf 0-5  Squamous Epithelial / LPF Latest Ref Range: 0 - 5  6-10  WBC, UA Latest Ref Range: 0 - 5 WBC/hpf 0-5   Results for [name], [name] (MRN [id number]) as of 07/07/2018 10:20  Ref. Range 07/07/2018 09:56  COMPREHENSIVE METABOLIC PANEL Unknown Rpt  Sodium Latest Ref Range: 135 - 145 mmol/Johnston 134 (Johnston)  Potassium Latest Ref Range: 3.5 - 5.1 mmol/Johnston 3.4 (Johnston)  Chloride Latest Ref Range: 98 - 111 mmol/Johnston 101  CO2 Latest Ref Range: 22 - 32 mmol/Johnston 22  Glucose Latest Ref Range: 70 - 99 mg/dL 147106 (H)  BUN Latest Ref Range: 6 - 20 mg/dL 8  Creatinine Latest Ref Range: 0.44 - 1.00 mg/dL 8.290.63  Calcium Latest Ref Range: 8.9 - 10.3 mg/dL 9.1  Anion gap Latest Ref Range: 5 - 15  11  Alkaline Phosphatase Latest Ref Range: 38 - 126 U/Johnston 54  Albumin Latest Ref Range: 3.5 - 5.0 g/dL 2.6 (Johnston)  AST Latest Ref Range: 15 - 41 U/Johnston 17  ALT Latest Ref Range: 0 - 44 U/Johnston 11  Total Protein Latest Ref Range: 6.5 - 8.1 g/dL 6.6  Total Bilirubin Latest Ref Range: 0.3 - 1.2 mg/dL 0.4  GFR, Est Non African American Latest Ref Range: >60 mL/min >60  GFR, Est African American Latest Ref Range: >60 mL/min >60  WBC Latest Ref Range: 4.0 - 10.5 K/uL 8.7  RBC Latest Ref Range: 3.87 - 5.11 MIL/uL 3.24 (Johnston)  Hemoglobin Latest Ref Range: 12.0 - 15.0 g/dL 8.8 (Johnston)  HCT Latest Ref Range: 36.0 - 46.0 % 27.3 (Johnston)  MCV Latest Ref Range: 78.0 - 100.0 fL 84.3  MCH Latest Ref Range: 26.0 - 34.0 pg 27.2  MCHC Latest Ref Range: 30.0 - 36.0 g/dL 56.232.2  RDW Latest Ref Range: 11.5  - 15.5 % 14.8  Platelets Latest Ref Range: 150 - 400 K/uL 235   Assessment and Plan  UA consistent with UTI. Nitrites and bacteria present. Treated prophylacticlly with Macrobid in June Pt. states that she completed regimine but had vomiting 10-15 after taking each dose. May extend treatment duration. Pain: pt. Given IM Toradol which pt. States has been helpful. Will continue on percocet N&V: Pt. Given IV phenergan,  Will receive prescription for vaginal suppository of phenergan. Anemia: HGB 8.8 from CBC on 7/16. Pt. Has been asymptomatic. Neg. Pallor, SOB, conjunctiva pink. Will start pt. On oral iron supplmentation.  Rushdan Winona LegatoM Islam FNP (student) 07/07/2018, 9:54 AM    CNM attestation:  I have seen and examined this patient and agree with above documentation in the Islan Rushand's note.   [name] is a 37 y.o. [id number] at 2525w4d reporting right-sided pain from her presumed kidney stone.  +FM, denies LOF, VB, contractions, vaginal discharge.  PE: Patient Vitals for the past 24 hrs:  BP Temp Temp src Pulse SpO2 Height Weight  07/07/18 0905 125/72 98.3 F (36.8 C) Oral 92 99 % 5\' 3"  (1.6 m) 174 lb (78.9 kg)   Gen: calm comfortable, NAD Resp: normal effort, no distress Heart: Regular rate Abd: Soft, NT, gravid, S=D  FHR: Baseline 150, Moderate Variability, pos accels, occasioanl decels Toco: UC's Q q4 min initially, now resolved.  ROS, labs, PMH reviewed  Orders Placed This Encounter  Procedures  . CBC  . Comprehensive metabolic panel  . Urinalysis, Complete w Microscopic   Meds ordered this encounter  Medications  . lactated ringers infusion  . promethazine (PHENERGAN) injection 25 mg  . DISCONTD: tamsulosin (FLOMAX) capsule 0.4 mg  . ketorolac (TORADOL) injection 60 mg  . ferumoxytol (  FERAHEME) 510 mg in sodium chloride 0.9 % 100 mL IVPB  . 0.9 %  sodium chloride infusion    MDM -Feraheme infusion for Hgb 8.8. Patient tolerated Iron infusion well; has  not had any episodes of vomiting in over an hour.  -Cervix is long, posterior, FT -NST: 150 bpm, mod var, present acel, occasional variable, initially irregular contractions which have now subsided.  Assessment: 1. Nephrolithiasis     Plan: - Discharge home in stable condition. -Order for second dose of Feraheme sent to [facility]; patient knows that we will  call patient with instructions for second iron infusion next week - Labor  precautions. - Continue to take percocet, phenergan suppositories, iron 2x a day, flomax.  - Follow-up as scheduled at your doctor's office for next prenatal visit or sooner as needed if symptoms worsen. - Return to maternity admissions symptoms worsen  [name], CNM 07/07/2018 12:21 PM

## 2018-07-07 NOTE — Addendum Note (Signed)
Addended by: Maretta BeesMCGLASHAN, Rihanna Marseille J on: 07/07/2018 11:35 AM   Modules accepted: Level of Service

## 2018-07-07 NOTE — Discharge Instructions (Signed)
-  take phenergan suppositories rectally every 6 hours for vomiting; after you take the suppositories take the percocet 30 min later -take flomax daily -take 400 mg of ibuprofen every 4 hours; take with food to avoid stomach upset -[facility]    Kidney Stones Kidney stones (urolithiasis) are rock-like masses that form inside of the kidneys. Kidneys are organs that make pee (urine). A kidney stone can cause very bad pain and can block the flow of pee. The stone usually leaves your body (passes) through your pee. You may need to have a doctor take out the stone. Follow these instructions at home: Eating and drinking  Drink enough fluid to keep your pee clear or pale yellow. This will help you pass the stone.  If told by your doctor, change the foods you eat (your diet). This may include: ? Limiting how much salt (sodium) you eat. ? Eating more fruits and vegetables. ? Limiting how much meat, poultry, fish, and eggs you eat.  Follow instructions from your doctor about eating or drinking restrictions. General instructions  Collect pee samples as told by your doctor. You may need to collect a pee sample: ? 24 hours after a stone comes out. ? 8-12 weeks after a stone comes out, and every 6-12 months after that.  Strain your pee every time you pee (urinate), for as long as told. Use the strainer that your doctor recommends.  Do not throw out the stone. Keep it so that it can be tested by your doctor.  Take over-the-counter and prescription medicines only as told by your doctor.  Keep all follow-up visits as told by your doctor. This is important. You may need follow-up tests. Preventing kidney stones To prevent another kidney stone:  Drink enough fluid to keep your pee clear or pale yellow. This is the best way to prevent kidney stones.  Eat healthy foods.  Avoid certain foods as told by your doctor. You may be told to eat less  protein.  Stay at a healthy weight.  Contact a doctor if:  You have pain that gets worse or does not get better with medicine. Get help right away if:  You have a fever or chills.  You get very bad pain.  You get new pain in your belly (abdomen).  You pass out (faint).  You cannot pee. This information is not intended to replace advice given to you by your health care provider. Make sure you discuss any questions you have with your health care provider. Document Released: 05/27/2008 Document Revised: 08/27/2016 Document Reviewed: 08/27/2016 Elsevier Interactive Patient Education  2017 [employer]

## 2018-07-07 NOTE — Progress Notes (Signed)
While in MAU pt found out that her grandmother had died unexpectedly.  She was very tearful and I received a referral from her nurse.  She shared some with me, but did not wish to talk much at this time.  She had her husband and daughter with her and she was planning to get together with other family members later in the day and she felt well supported.    Chaplain Dyanne CarrelKaty Charlynn Salih, Bcc Pager, [phone] 3:29 PM   07/07/18 1500  Clinical Encounter Type  Visited With Patient and family together  Visit Type Spiritual support  Referral From Nurse

## 2018-07-07 NOTE — Progress Notes (Signed)
Presents for 17P Injection,  Given in R arm, tolerated well. Patient supplied.  Administrations This Visit    HYDROXYprogesterone Caproate SOAJ 275 mg    Admin Date 07/07/2018 Action Given Dose 275 mg Route Subcutaneous Administered By Maretta BeesMcGlashan, Nicola Quesnell J, RMA         Patient c/o of having to go to the ER on Friday and was told that she has Kidney Stone, she is going back today for an US to see if it has moved.

## 2018-07-07 NOTE — MAU Note (Signed)
Pt presents to MAU with complaints of right lower back pain and vomiting States she was evaluated in MAU on Friday and given percocet and phenergan and has not taken it recently because it was not helping the pain. [facility]

## 2018-07-08 ENCOUNTER — Encounter (HOSPITAL_COMMUNITY): Payer: Self-pay

## 2018-07-13 ENCOUNTER — Ambulatory Visit (HOSPITAL_COMMUNITY)
Admission: RE | Admit: 2018-07-13 | Discharge: 2018-07-13 | Disposition: A | Discharge status: Home / Self Care | Payer: MEDICAID | Source: Ambulatory Visit | Attending: Certified Nurse Midwife | Admitting: Certified Nurse Midwife

## 2018-07-13 HISTORY — DX: Personal history of other specified conditions: Z87.898

## 2018-07-13 HISTORY — DX: Cocaine use, unspecified, in remission: F14.91

## 2018-07-14 ENCOUNTER — Other Ambulatory Visit (HOSPITAL_COMMUNITY): Payer: Self-pay | Admitting: Obstetrics and Gynecology

## 2018-07-14 ENCOUNTER — Ambulatory Visit (INDEPENDENT_AMBULATORY_CARE_PROVIDER_SITE_OTHER): Payer: Medicaid Other

## 2018-07-14 ENCOUNTER — Encounter (HOSPITAL_COMMUNITY): Payer: Self-pay

## 2018-07-14 ENCOUNTER — Ambulatory Visit (HOSPITAL_COMMUNITY)
Admission: RE | Admit: 2018-07-14 | Discharge: 2018-07-14 | Disposition: A | Discharge status: Home / Self Care | Payer: Medicaid Other | Source: Ambulatory Visit | Attending: Obstetrics | Admitting: Obstetrics

## 2018-07-14 DIAGNOSIS — O10919 Unspecified pre-existing hypertension complicating pregnancy, unspecified trimester: Secondary | ICD-10-CM

## 2018-07-14 DIAGNOSIS — Z3A24 24 weeks gestation of pregnancy: Secondary | ICD-10-CM | POA: Diagnosis not present

## 2018-07-14 DIAGNOSIS — O09892 Supervision of other high risk pregnancies, second trimester: Secondary | ICD-10-CM

## 2018-07-14 DIAGNOSIS — O09522 Supervision of elderly multigravida, second trimester: Secondary | ICD-10-CM | POA: Insufficient documentation

## 2018-07-14 DIAGNOSIS — Z362 Encounter for other antenatal screening follow-up: Secondary | ICD-10-CM | POA: Diagnosis not present

## 2018-07-14 DIAGNOSIS — O09213 Supervision of pregnancy with history of pre-term labor, third trimester: Secondary | ICD-10-CM

## 2018-07-14 DIAGNOSIS — O09212 Supervision of pregnancy with history of pre-term labor, second trimester: Secondary | ICD-10-CM | POA: Diagnosis not present

## 2018-07-14 DIAGNOSIS — O09523 Supervision of elderly multigravida, third trimester: Secondary | ICD-10-CM | POA: Diagnosis not present

## 2018-07-14 DIAGNOSIS — O10912 Unspecified pre-existing hypertension complicating pregnancy, second trimester: Secondary | ICD-10-CM | POA: Diagnosis not present

## 2018-07-14 NOTE — Progress Notes (Addendum)
Nurse visit for 17p given L arm w/o difficulty.   Attestation of Attending Supervision of CMA/RN: Evaluation and management procedures were performed by the nurse under my supervision and collaboration.  I have reviewed the nursing note and chart, and I agree with the management and plan.  Carolyn L. Harraway-Smith, M.D., Evern CoreFACOG

## 2018-07-21 ENCOUNTER — Ambulatory Visit: Payer: Medicaid Other

## 2018-07-21 VITALS — BP 125/75 | HR 101 | Wt 176.2 lb

## 2018-07-21 DIAGNOSIS — O0990 Supervision of high risk pregnancy, unspecified, unspecified trimester: Secondary | ICD-10-CM

## 2018-07-21 NOTE — Progress Notes (Signed)
Patient is in the office for 17-p injection, administered and pt tolerated well .Marland Kitchen. Administrations This Visit    HYDROXYprogesterone Caproate SOAJ 275 mg    Admin Date 07/21/2018 Action Given Dose 275 mg Route Subcutaneous Administered By Katrina StackStalling, Brittany D, RN

## 2018-07-28 ENCOUNTER — Ambulatory Visit (INDEPENDENT_AMBULATORY_CARE_PROVIDER_SITE_OTHER): Payer: Medicaid Other | Admitting: Obstetrics and Gynecology

## 2018-07-28 ENCOUNTER — Encounter: Payer: Self-pay | Admitting: Obstetrics and Gynecology

## 2018-07-28 VITALS — BP 128/80 | HR 105 | Wt 182.3 lb

## 2018-07-28 DIAGNOSIS — N200 Calculus of kidney: Secondary | ICD-10-CM

## 2018-07-28 DIAGNOSIS — F1491 Cocaine use, unspecified, in remission: Secondary | ICD-10-CM

## 2018-07-28 DIAGNOSIS — O09522 Supervision of elderly multigravida, second trimester: Secondary | ICD-10-CM

## 2018-07-28 DIAGNOSIS — O0990 Supervision of high risk pregnancy, unspecified, unspecified trimester: Secondary | ICD-10-CM

## 2018-07-28 DIAGNOSIS — I10 Essential (primary) hypertension: Secondary | ICD-10-CM

## 2018-07-28 DIAGNOSIS — O09212 Supervision of pregnancy with history of pre-term labor, second trimester: Secondary | ICD-10-CM

## 2018-07-28 DIAGNOSIS — Z87898 Personal history of other specified conditions: Secondary | ICD-10-CM | POA: Insufficient documentation

## 2018-07-28 DIAGNOSIS — O09892 Supervision of other high risk pregnancies, second trimester: Secondary | ICD-10-CM

## 2018-07-28 MED ORDER — HYDROXYPROGESTERONE CAPROATE 275 MG/1.1ML ~~LOC~~ SOAJ
275.0000 mg | Freq: Once | SUBCUTANEOUS | Status: AC
  Administered 2018-07-28: 275 mg via SUBCUTANEOUS

## 2018-07-28 NOTE — Progress Notes (Signed)
   PRENATAL VISIT NOTE  Subjective:  [name] is a 37 y.o. [id number] at 3762w4d being seen today for ongoing prenatal care.  She is currently monitored for the following issues for this high-risk pregnancy and has UTI (urinary tract infection) during pregnancy, second trimester; HTN (hypertension); Right ovarian cyst; Hypokalemia; Anemia; Cocaine use; TIA (transient ischemic attack); Acute left-sided weakness; Elevated BP; Prediabetes; HLD (hyperlipidemia); Supervision of high risk pregnancy, antepartum; Advanced maternal age in multigravida; History of preterm delivery, currently pregnant in second trimester; Kidney stones; and History of cocaine use on their problem list.  Patient reports occasional contractions.  Contractions: Irritability. Vag. Bleeding: None.  Movement: Present. Denies leaking of fluid. States she can tell the shots are making her feel like she is having less pressure and contractions than in prior pregnancies.   The following portions of the patient's history were reviewed and updated as appropriate: allergies, current medications, past family history, past medical history, past social history, past surgical history and problem list. Problem list updated.  Objective:   Vitals:   07/28/18 0852  BP: 128/80  Pulse: (!) 105  Weight: 182 lb 4.8 oz (82.7 kg)    Fetal Status: Fetal Heart Rate (bpm): 154   Movement: Present     General:  Alert, oriented and cooperative. Patient is in no acute distress.  Skin: Skin is warm and dry. No rash noted.   Cardiovascular: Normal heart rate noted  Respiratory: Normal respiratory effort, no problems with respiration noted  Abdomen: Soft, gravid, appropriate for gestational age.  Pain/Pressure: Absent     Pelvic: Cervical exam deferred        Extremities: Normal range of motion.  Edema: None  Mental Status: Normal mood and affect. Normal behavior. Normal judgment and thought content.   Assessment and Plan:  Pregnancy:  [id number] at 10862w4d  1. History of preterm delivery, currently pregnant in second trimester - HYDROXYprogesterone Caproate SOAJ 275 mg  2. Supervision of high risk pregnancy, antepartum Reviewed tubal ligation, she will consider and speak with husband  3. History of cocaine use Denies use in pregnancy Consents to UDS (not done yet in pregnancy) - ToxASSURE Select 13 (MW), Urine  4. Right ovarian cyst - seen on US - occasional pain but manages well  5. Multigravida of advanced maternal age in second trimester  6. Essential hypertension Cont baby ASA   Preterm labor symptoms and general obstetric precautions including but not limited to vaginal bleeding, contractions, leaking of fluid and fetal movement were reviewed in detail with the patient. Please refer to After Visit Summary for other counseling recommendations.  Return in about 2 weeks (around 08/11/2018) for OB visit, 2 hr GTT, Tdap, 3rd trim labs.  No future appointments.  Conan BowensKelly M Davis, MD

## 2018-07-30 ENCOUNTER — Telehealth: Payer: Self-pay | Admitting: *Deleted

## 2018-07-30 NOTE — Telephone Encounter (Signed)
Called patient to inform of all ob visits and shot injections and no answer and unable to leave a voice mail.Marland Kitchen.Marland Kitchen..Marland Kitchen

## 2018-08-04 ENCOUNTER — Encounter (HOSPITAL_COMMUNITY): Payer: Self-pay | Admitting: *Deleted

## 2018-08-04 ENCOUNTER — Ambulatory Visit (INDEPENDENT_AMBULATORY_CARE_PROVIDER_SITE_OTHER): Payer: Medicaid Other | Admitting: Obstetrics and Gynecology

## 2018-08-04 ENCOUNTER — Ambulatory Visit: Payer: Medicaid Other

## 2018-08-04 ENCOUNTER — Other Ambulatory Visit: Payer: Self-pay

## 2018-08-04 ENCOUNTER — Inpatient Hospital Stay (HOSPITAL_COMMUNITY): Payer: Medicaid Other

## 2018-08-04 ENCOUNTER — Observation Stay (HOSPITAL_COMMUNITY)
Admission: AD | Admit: 2018-08-04 | Discharge: 2018-08-06 | Disposition: A | Discharge status: Home / Self Care | Payer: Medicaid Other | Source: Ambulatory Visit | Attending: Obstetrics and Gynecology | Admitting: Obstetrics and Gynecology

## 2018-08-04 ENCOUNTER — Encounter: Payer: Self-pay | Admitting: Obstetrics and Gynecology

## 2018-08-04 VITALS — BP 141/87 | HR 99 | Wt 179.0 lb

## 2018-08-04 DIAGNOSIS — O09212 Supervision of pregnancy with history of pre-term labor, second trimester: Secondary | ICD-10-CM

## 2018-08-04 DIAGNOSIS — R1031 Right lower quadrant pain: Secondary | ICD-10-CM

## 2018-08-04 DIAGNOSIS — N83201 Unspecified ovarian cyst, right side: Secondary | ICD-10-CM

## 2018-08-04 DIAGNOSIS — O0992 Supervision of high risk pregnancy, unspecified, second trimester: Secondary | ICD-10-CM

## 2018-08-04 DIAGNOSIS — O09892 Supervision of other high risk pregnancies, second trimester: Secondary | ICD-10-CM

## 2018-08-04 DIAGNOSIS — O4700 False labor before 37 completed weeks of gestation, unspecified trimester: Secondary | ICD-10-CM | POA: Diagnosis present

## 2018-08-04 DIAGNOSIS — O4703 False labor before 37 completed weeks of gestation, third trimester: Secondary | ICD-10-CM | POA: Diagnosis present

## 2018-08-04 DIAGNOSIS — Z7982 Long term (current) use of aspirin: Secondary | ICD-10-CM | POA: Diagnosis not present

## 2018-08-04 DIAGNOSIS — Z79899 Other long term (current) drug therapy: Secondary | ICD-10-CM | POA: Insufficient documentation

## 2018-08-04 DIAGNOSIS — Z3A27 27 weeks gestation of pregnancy: Secondary | ICD-10-CM | POA: Insufficient documentation

## 2018-08-04 DIAGNOSIS — O0990 Supervision of high risk pregnancy, unspecified, unspecified trimester: Secondary | ICD-10-CM

## 2018-08-04 DIAGNOSIS — I10 Essential (primary) hypertension: Secondary | ICD-10-CM

## 2018-08-04 DIAGNOSIS — O3482 Maternal care for other abnormalities of pelvic organs, second trimester: Secondary | ICD-10-CM

## 2018-08-04 DIAGNOSIS — N83209 Unspecified ovarian cyst, unspecified side: Secondary | ICD-10-CM

## 2018-08-04 DIAGNOSIS — O219 Vomiting of pregnancy, unspecified: Secondary | ICD-10-CM

## 2018-08-04 DIAGNOSIS — O09523 Supervision of elderly multigravida, third trimester: Secondary | ICD-10-CM

## 2018-08-04 DIAGNOSIS — O479 False labor, unspecified: Secondary | ICD-10-CM

## 2018-08-04 LAB — CBC WITH DIFFERENTIAL/PLATELET
Basophils Absolute: 0 10*3/uL (ref 0.0–0.1)
Basophils Relative: 0 %
EOS ABS: 0.2 10*3/uL (ref 0.0–0.7)
EOS PCT: 2 %
HCT: 28.7 % — ABNORMAL LOW (ref 36.0–46.0)
Hemoglobin: 9.4 g/dL — ABNORMAL LOW (ref 12.0–15.0)
LYMPHS ABS: 1.5 10*3/uL (ref 0.7–4.0)
Lymphocytes Relative: 18 %
MCH: 29.2 pg (ref 26.0–34.0)
MCHC: 32.8 g/dL (ref 30.0–36.0)
MCV: 89.1 fL (ref 78.0–100.0)
MONO ABS: 0.2 10*3/uL (ref 0.1–1.0)
MONOS PCT: 3 %
Neutro Abs: 6.6 10*3/uL (ref 1.7–7.7)
Neutrophils Relative %: 77 %
PLATELETS: 203 10*3/uL (ref 150–400)
RBC: 3.22 MIL/uL — ABNORMAL LOW (ref 3.87–5.11)
RDW: 19.2 % — AB (ref 11.5–15.5)
WBC: 8.5 10*3/uL (ref 4.0–10.5)

## 2018-08-04 LAB — FETAL FIBRONECTIN: Fetal Fibronectin: NEGATIVE

## 2018-08-04 LAB — URINALYSIS, ROUTINE W REFLEX MICROSCOPIC
BILIRUBIN URINE: NEGATIVE
GLUCOSE, UA: NEGATIVE mg/dL
HGB URINE DIPSTICK: NEGATIVE
KETONES UR: NEGATIVE mg/dL
Leukocytes, UA: NEGATIVE
Nitrite: NEGATIVE
PROTEIN: NEGATIVE mg/dL
Specific Gravity, Urine: 1.009 (ref 1.005–1.030)
pH: 7 (ref 5.0–8.0)

## 2018-08-04 LAB — COMPREHENSIVE METABOLIC PANEL
ALK PHOS: 53 U/L (ref 38–126)
ALT: 13 U/L (ref 0–44)
ANION GAP: 10 (ref 5–15)
AST: 14 U/L — ABNORMAL LOW (ref 15–41)
Albumin: 2.9 g/dL — ABNORMAL LOW (ref 3.5–5.0)
BUN: 7 mg/dL (ref 6–20)
CALCIUM: 9.5 mg/dL (ref 8.9–10.3)
CHLORIDE: 105 mmol/L (ref 98–111)
CO2: 21 mmol/L — AB (ref 22–32)
Creatinine, Ser: 0.59 mg/dL (ref 0.44–1.00)
GFR calc non Af Amer: >60 mL/min (ref >60)
Glucose, Bld: 85 mg/dL (ref 70–99)
POTASSIUM: 3.8 mmol/L (ref 3.5–5.1)
SODIUM: 136 mmol/L (ref 135–145)
Total Bilirubin: 0.4 mg/dL (ref 0.3–1.2)
Total Protein: 6.2 g/dL — ABNORMAL LOW (ref 6.5–8.1)

## 2018-08-04 LAB — TYPE AND SCREEN
ABO/RH(D): O POS
Antibody Screen: NEGATIVE

## 2018-08-04 LAB — AMYLASE: Amylase: 85 U/L (ref 28–100)

## 2018-08-04 LAB — LIPASE, BLOOD: LIPASE: 41 U/L (ref 11–51)

## 2018-08-04 MED ORDER — PRENATAL MULTIVITAMIN CH
1.0000 | ORAL_TABLET | Freq: Every day | ORAL | Status: DC
  Administered 2018-08-05 – 2018-08-06 (×2): 1 via ORAL
  Filled 2018-08-04 (×2): qty 1

## 2018-08-04 MED ORDER — NIFEDIPINE 10 MG PO CAPS
10.0000 mg | ORAL_CAPSULE | ORAL | Status: DC | PRN
  Administered 2018-08-05 (×2): 10 mg via ORAL
  Filled 2018-08-04 (×2): qty 1

## 2018-08-04 MED ORDER — ZOLPIDEM TARTRATE 5 MG PO TABS
5.0000 mg | ORAL_TABLET | Freq: Every evening | ORAL | Status: DC | PRN
  Administered 2018-08-05 (×2): 5 mg via ORAL
  Filled 2018-08-04 (×2): qty 1

## 2018-08-04 MED ORDER — ONDANSETRON HCL 4 MG/2ML IJ SOLN
4.0000 mg | Freq: Once | INTRAMUSCULAR | Status: AC
  Administered 2018-08-04: 4 mg via INTRAVENOUS
  Filled 2018-08-04: qty 2

## 2018-08-04 MED ORDER — LACTATED RINGERS IV BOLUS
1000.0000 mL | Freq: Once | INTRAVENOUS | Status: AC
Start: 2018-08-04 — End: 2018-08-04
  Administered 2018-08-04: 1000 mL via INTRAVENOUS

## 2018-08-04 MED ORDER — ACETAMINOPHEN 325 MG PO TABS
650.0000 mg | ORAL_TABLET | ORAL | Status: DC | PRN
  Administered 2018-08-05 – 2018-08-06 (×3): 650 mg via ORAL
  Filled 2018-08-04 (×3): qty 2

## 2018-08-04 MED ORDER — DOCUSATE SODIUM 100 MG PO CAPS
100.0000 mg | ORAL_CAPSULE | Freq: Every day | ORAL | Status: DC
  Administered 2018-08-05 – 2018-08-06 (×2): 100 mg via ORAL
  Filled 2018-08-04 (×2): qty 1

## 2018-08-04 MED ORDER — CALCIUM CARBONATE ANTACID 500 MG PO CHEW: 2.0000 | CHEWABLE_TABLET | ORAL | Status: DC | PRN

## 2018-08-04 MED ORDER — HYDROMORPHONE HCL 1 MG/ML IJ SOLN
1.0000 mg | Freq: Once | INTRAMUSCULAR | Status: AC
  Administered 2018-08-04: 1 mg via INTRAVENOUS
  Filled 2018-08-04: qty 1

## 2018-08-04 MED ORDER — HYDROMORPHONE HCL 1 MG/ML IJ SOLN
1.0000 mg | INTRAMUSCULAR | Status: DC | PRN
  Administered 2018-08-04 – 2018-08-06 (×5): 1 mg via INTRAVENOUS
  Filled 2018-08-04 (×5): qty 1

## 2018-08-04 MED ORDER — TERBUTALINE SULFATE 1 MG/ML IJ SOLN
0.2500 mg | Freq: Once | INTRAMUSCULAR | Status: AC
  Administered 2018-08-04: 0.25 mg via SUBCUTANEOUS
  Filled 2018-08-04: qty 1

## 2018-08-04 MED ORDER — LACTATED RINGERS IV SOLN
INTRAVENOUS | Status: DC
  Administered 2018-08-04 – 2018-08-06 (×7): via INTRAVENOUS

## 2018-08-04 NOTE — MAU Note (Signed)
Started contracting yesterday. Had 3 really painful contractions that woke her during the night; continued this morning.  Vomiting, no diarrhea.  No bleeding or leaking.

## 2018-08-04 NOTE — MAU Provider Note (Addendum)
THE [facility] [place] MATERNITY ADMISSIONS Provider Note   CSN: [id number] Arrival date & time: 08/04/18  4098     History   Chief Complaint Chief Complaint  Patient presents with  . Contractions  . Emesis    HPI [name] is a 37 y.o. [id number] @ [redacted]w[redacted]d gestation who presents to the ED with contractions. Patient was at the office today for her "weekly shot" and told the provider that she was feeling contractions. Patient was placed on EFM and was contracting. Patient reports that last night she had 3 really hard contractions that woke her from sleep. Patient denies vaginal bleeding or leaking of fluid. Patient reports n/v and right side abdominal pain that has gotten worse today.  HPI  Past Medical History:  Diagnosis Date  . GERD (gastroesophageal reflux disease)   . History of cocaine use   . Hypertension   . Kidney stones   . Ovarian cyst   . Pregnancy induced hypertension   . Pregnant 08/09/2015    Patient Active Problem List   Diagnosis Date Noted  . History of cocaine use 07/28/2018  . Kidney stones 07/07/2018  . History of preterm delivery, currently pregnant in second trimester 06/30/2018  . Supervision of high risk pregnancy, antepartum 04/07/2018  . Advanced maternal age in multigravida 04/07/2018  . Elevated BP   . Prediabetes   . HLD (hyperlipidemia)   . TIA (transient ischemic attack) 07/30/2016  . Acute left-sided weakness 07/30/2016  . Cocaine use 01/16/2016  . Hypokalemia 09/11/2015  . Anemia 09/11/2015  . Right ovarian cyst 08/30/2015  . HTN (hypertension) 01/09/2014  . UTI (urinary tract infection) during pregnancy, second trimester 06/07/2013    Past Surgical History:  Procedure Laterality Date  . APPENDECTOMY    . HEMORRHOID SURGERY N/A 01/19/2016   Procedure: EXTENSIVE HEMORRHOIDECTOMY;  Surgeon: Franky Macho, MD;  Location: [facility];  Service: General;  Laterality: N/A;  . kidney stents    . LITHOTRIPSY        OB History    Gravida  6   Para  5   Term  4   Preterm  1   AB      Living  5     SAB      TAB      Ectopic      Multiple  0   Live Births  5            Home Medications    Prior to Admission medications   Medication Sig Start Date End Date Taking? Authorizing Provider  Doxylamine-Pyridoxine (DICLEGIS) 10-10 MG TBEC Take 1 tablet with breakfast and lunch.  Take 2 tablets at bedtime. 06/30/18  Yes Denney, Rachelle A, CNM  Prenatal Vit-Fe Fumarate-FA (PRENATAL MULTIVITAMIN) TABS tablet Take 1 tablet by mouth daily at 12 noon.   Yes [provider]  promethazine (PHENERGAN) 12.5 MG tablet Take 12.5 mg by mouth every 6 (six) hours as needed for nausea or vomiting.  07/18/18  Yes [provider]  aspirin EC 81 MG tablet Take 1 tablet (81 mg total) by mouth daily. Patient not taking: Reported on 07/07/2018 04/07/18   Adam Phenix, MD  Elastic Bandages & Supports (COMFORT FIT MATERNITY SUPP LG) MISC 1 Units by Does not apply route daily. 06/30/18   Denney, Rachelle A, CNM  ferrous fumarate (HEMOCYTE - 106 MG FE) 325 (106 Fe) MG TABS tablet Take 1 tablet (106 mg of iron total) by mouth 2 (two) times  daily. Patient not taking: Reported on 08/04/2018 07/07/18   Marylene LandKooistra, Kathryn Lorraine, CNM  ibuprofen (ADVIL,MOTRIN) 400 MG tablet Take 1 tablet (400 mg total) by mouth every 6 (six) hours as needed. Patient not taking: Reported on 08/04/2018 07/07/18   Marylene LandKooistra, Kathryn Lorraine, CNM  pantoprazole (PROTONIX) 40 MG tablet Take 1 tablet (40 mg total) by mouth daily. Patient not taking: Reported on 08/04/2018 04/21/18 04/21/19  Adam PhenixArnold, James G, MD  phenazopyridine (PYRIDIUM) 200 MG tablet Take 1 tablet (200 mg total) by mouth 3 (three) times daily as needed for pain. Patient not taking: Reported on 08/04/2018 06/30/18   Roe Coombsenney, Rachelle A, CNM  promethazine (PHENERGAN) 25 MG suppository Place 1 suppository (25 mg total) rectally every 6 (six) hours as needed for nausea  or vomiting. Patient not taking: Reported on 08/04/2018 07/07/18   Marylene LandKooistra, Kathryn Lorraine, CNM  terconazole (TERAZOL 3) 0.8 % vaginal cream Place 1 applicator vaginally at bedtime. Patient not taking: Reported on 07/28/2018 07/02/18   Roe Coombsenney, Rachelle A, CNM    Family History Family History  Problem Relation Age of Onset  . Cancer Mother        breast  . Diabetes Maternal Grandmother     Social History Social History   Tobacco Use  . Smoking status: Never Smoker  . Smokeless tobacco: Never Used  Substance Use Topics  . Alcohol use: No  . Drug use: No     Allergies   Patient has no known allergies.   Review of Systems Review of Systems  Constitutional: Negative for chills and fever.  HENT: Negative.   Eyes: Negative for visual disturbance.  Respiratory: Negative for cough and shortness of breath.   Cardiovascular: Negative for chest pain.  Gastrointestinal: Positive for abdominal pain, nausea and vomiting.  Genitourinary: Negative for dysuria, urgency, vaginal bleeding and vaginal discharge.  Skin: Negative for rash.  Neurological: Negative for headaches.  Psychiatric/Behavioral: Negative for confusion.     Physical Exam Updated Vital Signs BP 137/61 (BP Location: Left Arm)   Pulse 96   Temp 98.5 F (36.9 C) (Oral)   LMP 01/12/2018   SpO2 98%   Physical Exam  Constitutional: She is oriented to person, place, and time. She appears well-developed and well-nourished. No distress.  HENT:  Head: Normocephalic.  Eyes: EOM are normal.  Neck: Neck supple.  Cardiovascular: Normal rate.  Pulmonary/Chest: Effort normal.  Abdominal: Soft. There is tenderness in the right lower quadrant. There is no rebound and no guarding.  Gravid c/w dates  Genitourinary:  Genitourinary Comments: Cervix 1 cm dilated, posterior, thick  Musculoskeletal: Normal range of motion.  Neurological: She is alert and oriented to person, place, and time. No cranial nerve deficit.  Skin:  Skin is warm and dry.  Psychiatric: She has a normal mood and affect.  Nursing note and vitals reviewed.    ED Treatments / Results  Labs (all labs ordered are listed, but only abnormal results are displayed) Labs Reviewed  URINALYSIS, ROUTINE W REFLEX MICROSCOPIC - Abnormal; Notable for the following components:      Result Value   APPearance HAZY (*)    All other components within normal limits  FETAL FIBRONECTIN    Radiology No results found.  Procedures Procedures (including critical care time)  Medications Ordered in ED Medications  lactated ringers bolus 1,000 mL (1,000 mLs Intravenous New Bag/Given 08/04/18 1105)  ondansetron (ZOFRAN) injection 4 mg (4 mg Intravenous Given 08/04/18 1106)  terbutaline (BRETHINE) injection 0.25 mg (0.25 mg Subcutaneous Given  08/04/18 1108)    EFM: baseline 154 initially with irregular contractions 12:15 pm baseline 160 variables   12:20 pm consult with Dr. Earlene Plateravis and she will see the patient in MAU. Initial Impression / Assessment and Plan / ED Course  I have reviewed the triage vital signs and the nursing notes.   Final Clinical Impressions(s) / ED Diagnoses  Care turned over to Dr. Earlene Plateravis 12:20  pm.     Patient seen and examined. She is 37 yo [id number] @ 8443w4d who started having intermittent stabbing RLQ pain two days ago, that worsened yesterday. She also started having nausea and vomiting yesterday as well as contractions. Had three painful contractions yesterday but increased in frequency today. She reports contractions are now 7/10 and she is getting the stabbing pain with each contraction, states it feels like her RLQ stabbing pain and the contractions are "feeding off each other." She also reports having normal appetite up until yesterday, was still hungry and eating but then would vomit directly after. She is hungry today but everything she eats just comes back up. Denies any diarrhea, constipation, last BM was yesterday and was  normal. She denies any sick contacts and denies cough/cold/congestion. Reports slightly dysuria. H/o appendectomy and does have known 5 cm simple cyst on right ovary.  She reports normal fetal movements, denies bleeding. Reports occasional leaking but she thinks it is urine.  H/o delivery at 33 weeks for pre-eclampsia. She has been getting Makena shots for h/o PTD, last shot today in office.  BP 137/61 (BP Location: Left Arm)   Pulse 96   Temp 98.5 F (36.9 C) (Oral)   LMP 01/12/2018   SpO2 98%  Gen: alert, oriented but appears fatigued and slightly ill Abd: soft, gravid, no tenderness in upper quadrants, mild discomfort in mid abdomen, mild to moderate discomfort in LLQ, significant tenderness in suprapubic area and right lower quadrant, abdomen diffusely soft  FHT: reactive Toco: contractions Q 4-5 min  A/P: 37 yo [id number] @ 8443w4d with RLQ pain and contractions. Concern for torsion based on intermittent stabbing pain, h/o cyst, h/o appendectomy and lack of other infectious symptoms. Will obtain US to rule out torsion. Discussed with patient and husband, they are agreeable to plan.  Baldemar LenisK. Meryl Wylma Tatem, M.D. [facility] 08/04/18 2:36 PM

## 2018-08-04 NOTE — Progress Notes (Signed)
   PRENATAL VISIT NOTE  Subjective:  [name] is a 37 y.o. [id number] at 4829w4d being seen today for ongoing prenatal care.  She is currently monitored for the following issues for this high-risk pregnancy and has UTI (urinary tract infection) during pregnancy, second trimester; HTN (hypertension); Right ovarian cyst; Hypokalemia; Anemia; Cocaine use; TIA (transient ischemic attack); Acute left-sided weakness; Elevated BP; Prediabetes; HLD (hyperlipidemia); Supervision of high risk pregnancy, antepartum; Advanced maternal age in multigravida; History of preterm delivery, currently pregnant in second trimester; Kidney stones; and History of cocaine use on their problem list.  Patient reports contraction for the past 24 hours which increased in intensity last night.  Contractions: Irregular. Vag. Bleeding: None.  Movement: Present. Denies leaking of fluid.   The following portions of the patient's history were reviewed and updated as appropriate: allergies, current medications, past family history, past medical history, past social history, past surgical history and problem list. Problem list updated.  Objective:   Vitals:   08/04/18 0855  BP: (!) 141/87  Pulse: 99  Weight: 179 lb (81.2 kg)    Fetal Status:     Movement: Present     General:  Alert, oriented and cooperative. Patient is in no acute distress.  Skin: Skin is warm and dry. No rash noted.   Cardiovascular: Normal heart rate noted  Respiratory: Normal respiratory effort, no problems with respiration noted  Abdomen: Soft, gravid, appropriate for gestational age.  Pain/Pressure: Present     Pelvic: Cervical exam deferred       in order for FFN collection  Extremities: Normal range of motion.     Mental Status: Normal mood and affect. Normal behavior. Normal judgment and thought content.   Assessment and Plan:  Pregnancy: [id number] at 2129w4d  1. Supervision of high risk pregnancy, antepartum Patient is doing well  reporting irregular contractions TOC from previously UTI today - Culture, OB Urine - ToxASSURE Select 13 (MW), Urine  2. History of preterm delivery, currently pregnant in second trimester Continue weekly 17-P Patient sent to MAU for evaluation of preterm labor NST reviewed and appropriate for gestational age: baseline150, mod variability, no accels, no decels Toco: contractions q4 minutes - Fetal nonstress test; Future  3. Essential hypertension Continue ASA Monitor BP  4. Multigravida of advanced maternal age in third trimester Low risk NIPS  5. Right ovarian cyst   Preterm labor symptoms and general obstetric precautions including but not limited to vaginal bleeding, contractions, leaking of fluid and fetal movement were reviewed in detail with the patient. Please refer to After Visit Summary for other counseling recommendations.  No follow-ups on file.  Future Appointments  Date Time Provider Department Center  08/04/2018  9:15 AM Sharday Michl, Gigi GinPeggy, MD [facility] None  08/11/2018  9:15 AM [facility] None  08/11/2018  9:30 AM Hermina StaggersErvin, Michael L, MD [facility] None    Catalina AntiguaPeggy Rahmel Nedved, MD

## 2018-08-04 NOTE — H&P (Signed)
Obstetric History and Physical  [name] is a 37 y.o. [id number] with IUP at [redacted]w[redacted]d presenting for intermittent stabbing RLQ pain two days ago, that worsened yesterday. She also started having nausea and vomiting yesterday as well as contractions. Had three painful contractions yesterday but increased in frequency today. She reports contractions are now 7/10 and she is getting the stabbing pain with each contraction, states it feels like her RLQ stabbing pain and the contractions are "feeding off each other." She also reports having normal appetite up until yesterday, was still hungry and eating but then would vomit directly after. She is hungry today but everything she eats just comes back up. Denies any diarrhea, constipation, last BM was yesterday and was normal. She denies any sick contacts and denies cough/cold/congestion. Reports slightly dysuria. H/o appendectomy and does have known 5 cm simple cyst on right ovary.  She reports normal fetal movements, denies bleeding. Reports occasional leaking but she thinks it is urine.  H/o delivery at 33 weeks for pre-eclampsia. She has been getting Makena shots for h/o PTD, last shot today in office.   Prenatal Course Pregnancy complications or risks: Patient Active Problem List   Diagnosis Date Noted  . Preterm uterine contractions in third trimester, antepartum 08/04/2018  . History of cocaine use 07/28/2018  . Kidney stones 07/07/2018  . History of preterm delivery, currently pregnant in second trimester 06/30/2018  . Supervision of high risk pregnancy, antepartum 04/07/2018  . Advanced maternal age in multigravida 04/07/2018  . Elevated BP   . Prediabetes   . HLD (hyperlipidemia)   . TIA (transient ischemic attack) 07/30/2016  . Acute left-sided weakness 07/30/2016  . Cocaine use 01/16/2016  . Hypokalemia 09/11/2015  . Anemia 09/11/2015  . Right ovarian cyst 08/30/2015  . HTN (hypertension) 01/09/2014  . UTI (urinary  tract infection) during pregnancy, second trimester 06/07/2013   Medical History:  Past Medical History:  Diagnosis Date  . GERD (gastroesophageal reflux disease)   . History of cocaine use   . Hypertension   . Kidney stones   . Ovarian cyst   . Pregnancy induced hypertension   . Pregnant 08/09/2015    Past Surgical History:  Procedure Laterality Date  . APPENDECTOMY    . HEMORRHOID SURGERY N/A 01/19/2016   Procedure: EXTENSIVE HEMORRHOIDECTOMY;  Surgeon: Franky Macho, MD;  Location: [facility];  Service: General;  Laterality: N/A;  . kidney stents    . LITHOTRIPSY      OB History  Gravida Para Term Preterm AB Living  6 5 4 1   5   SAB TAB Ectopic Multiple Live Births        0 5    # Outcome Date GA Lbr Len/2nd Weight Sex Delivery Anes PTL Lv  6 Current           5 Preterm 01/13/16 [redacted]w[redacted]d 05:59 / 00:04 2310 g M Vag-Spont None  LIV  4 Term 01/10/14 [redacted]w[redacted]d 09:00 / 04:13 3496 g M Vag-Spont EPI  LIV     Birth Comments: Mom 74 y/o G4P4. Labs neg. Gestational HTN on [place]. Baby 37 w, NSVD, L hydronephrosis which was normal at 36 w U/S. Had phototherapy due to 10 bili at 24 hrs. Remained at 10. Requested repeat today.  Mom O+/ baby O+.   3 Term 01/10/06 [redacted]w[redacted]d  3402 g F Vag-Spont EPI  LIV  2 Term 09/02/01 [redacted]w[redacted]d  4139 g M Vag-Spont EPI  LIV  1 Term 01/24/97 [redacted]w[redacted]d  2835 g F  Vag-Spont EPI  LIV    Social History   Socioeconomic History  . Marital status: Married    Spouse name: Not on file  . Number of children: Not on file  . Years of education: Not on file  . Highest education level: Not on file  Occupational History  . Not on file  Social Needs  . Financial resource strain: Not on file  . Food insecurity:    Worry: Not on file    Inability: Not on file  . Transportation needs:    Medical: Not on file    Non-medical: Not on file  Tobacco Use  . Smoking status: Never Smoker  . Smokeless tobacco: Never Used  Substance and Sexual Activity  . Alcohol use: No  . Drug use:  No  . Sexual activity: Yes    Birth control/protection: None  Lifestyle  . Physical activity:    Days per week: Not on file    Minutes per session: Not on file  . Stress: Not on file  Relationships  . Social connections:    Talks on phone: Not on file    Gets together: Not on file    Attends religious service: Not on file    Active member of club or organization: Not on file    Attends meetings of clubs or organizations: Not on file    Relationship status: Not on file  Other Topics Concern  . Not on file  Social History Narrative  . Not on file    Family History  Problem Relation Age of Onset  . Cancer Mother        breast  . Diabetes Maternal Grandmother     Facility-Administered Medications Prior to Admission  Medication Dose Route Frequency Provider Last Rate Last Dose  . HYDROXYprogesterone Caproate SOAJ 275 mg  275 mg Subcutaneous Weekly Denney, Rachelle A, CNM   275 mg at 08/04/18 0944   Medications Prior to Admission  Medication Sig Dispense Refill Last Dose  . Doxylamine-Pyridoxine (DICLEGIS) 10-10 MG TBEC Take 1 tablet with breakfast and lunch.  Take 2 tablets at bedtime. 100 tablet 4 08/03/2018 at Unknown time  . Prenatal Vit-Fe Fumarate-FA (PRENATAL MULTIVITAMIN) TABS tablet Take 1 tablet by mouth daily at 12 noon.   08/03/2018 at Unknown time  . promethazine (PHENERGAN) 12.5 MG tablet Take 12.5 mg by mouth every 6 (six) hours as needed for nausea or vomiting.   0 08/03/2018 at Unknown time  . aspirin EC 81 MG tablet Take 1 tablet (81 mg total) by mouth daily. (Patient not taking: Reported on 07/07/2018) 100 tablet 2 Not Taking  . Elastic Bandages & Supports (COMFORT FIT MATERNITY SUPP LG) MISC 1 Units by Does not apply route daily. 1 each 0 Taking  . ferrous fumarate (HEMOCYTE - 106 MG FE) 325 (106 Fe) MG TABS tablet Take 1 tablet (106 mg of iron total) by mouth 2 (two) times daily. (Patient not taking: Reported on 08/04/2018) 90 each 0 Not Taking at Unknown time  .  ibuprofen (ADVIL,MOTRIN) 400 MG tablet Take 1 tablet (400 mg total) by mouth every 6 (six) hours as needed. (Patient not taking: Reported on 08/04/2018) 20 tablet 0 Not Taking at Unknown time  . pantoprazole (PROTONIX) 40 MG tablet Take 1 tablet (40 mg total) by mouth daily. (Patient not taking: Reported on 08/04/2018) 30 tablet 6 Not Taking at Unknown time  . phenazopyridine (PYRIDIUM) 200 MG tablet Take 1 tablet (200 mg total) by mouth 3 (three) times  daily as needed for pain. (Patient not taking: Reported on 08/04/2018) 30 tablet 0 Not Taking at Unknown time  . promethazine (PHENERGAN) 25 MG suppository Place 1 suppository (25 mg total) rectally every 6 (six) hours as needed for nausea or vomiting. (Patient not taking: Reported on 08/04/2018) 20 each 0 Not Taking at Unknown time  . terconazole (TERAZOL 3) 0.8 % vaginal cream Place 1 applicator vaginally at bedtime. (Patient not taking: Reported on 07/28/2018) 20 g 0 Not Taking    No Known Allergies  Review of Systems: Negative except for what is mentioned in HPI.  Physical Exam: BP (!) 148/81   Pulse 95   Temp 98.5 F (36.9 C) (Oral)   Resp 16   LMP 01/12/2018   SpO2 98%  CONSTITUTIONAL: Well-developed, well-nourished female in mild HENT:  Normocephalic, atraumatic, External right and left ear normal. Oropharynx is clear and moist EYES: Conjunctivae and EOM are normal. Pupils are equal, round, and reactive to light. No scleral icterus.  NECK: Normal range of motion, supple, no masses SKIN: Skin is warm and dry. No rash noted. Not diaphoretic. No erythema. No pallor. NEUROLOGIC: Alert and oriented to person, place, and time. Normal reflexes, muscle tone coordination. No cranial nerve deficit noted. PSYCHIATRIC: Normal mood and affect. Normal behavior. Normal judgment and thought content. CARDIOVASCULAR: Normal heart rate noted, regular rhythm RESPIRATORY: Effort and breath sounds normal, no problems with respiration noted ABDOMEN:soft,  gravid, no tenderness in upper quadrants, mild discomfort in mid abdomen, mild to moderate discomfort in LLQ, significant tenderness in suprapubic area and right lower quadrant, abdomen diffusely softMUSCULOSKELETAL: Normal range of motion. No edema and no tenderness. 2+ distal pulses.  Cervical Exam: Dilatation closed thick in MAU   FHT:  reactive Contractions: Every 4-5 mins    Assessment : [name] is a 37 y.o. [id number] at 5442w4d being admitted for RLQ, pre-term contractions. H/o known ovarian cyst on right which is where her pain is. Concern for torsion given intermittent nature of pain, torsion ruled out by ultrasound. Will admit for pre-term contractions and pain for observation overnight. If progression in cervical exam, will give ANCS and/or magnesium.  Plan:  Pre-term contractions  - Admit to Antepartum - procardia prn - routine antepartum care - IVFs - Analgesia as needed  FWB  - Cat I fetal heart tracing    HTN - cont baby ASA  Baldemar LenisK. Meryl Leiliana Foody, M.D. Center for [facility]  08/04/2018, 5:10 PM

## 2018-08-05 ENCOUNTER — Observation Stay (HOSPITAL_COMMUNITY): Payer: Medicaid Other

## 2018-08-05 DIAGNOSIS — O09522 Supervision of elderly multigravida, second trimester: Secondary | ICD-10-CM

## 2018-08-05 DIAGNOSIS — Z3A27 27 weeks gestation of pregnancy: Secondary | ICD-10-CM | POA: Diagnosis not present

## 2018-08-05 DIAGNOSIS — O09212 Supervision of pregnancy with history of pre-term labor, second trimester: Secondary | ICD-10-CM

## 2018-08-05 DIAGNOSIS — O479 False labor, unspecified: Secondary | ICD-10-CM | POA: Diagnosis present

## 2018-08-05 DIAGNOSIS — O09292 Supervision of pregnancy with other poor reproductive or obstetric history, second trimester: Secondary | ICD-10-CM

## 2018-08-05 DIAGNOSIS — O4700 False labor before 37 completed weeks of gestation, unspecified trimester: Secondary | ICD-10-CM | POA: Diagnosis present

## 2018-08-05 LAB — CBC
HEMATOCRIT: 28 % — AB (ref 36.0–46.0)
HEMOGLOBIN: 9.2 g/dL — AB (ref 12.0–15.0)
MCH: 29.2 pg (ref 26.0–34.0)
MCHC: 32.9 g/dL (ref 30.0–36.0)
MCV: 88.9 fL (ref 78.0–100.0)
Platelets: 188 10*3/uL (ref 150–400)
RBC: 3.15 MIL/uL — AB (ref 3.87–5.11)
RDW: 19.3 % — AB (ref 11.5–15.5)
WBC: 7.5 10*3/uL (ref 4.0–10.5)

## 2018-08-05 LAB — COMPREHENSIVE METABOLIC PANEL
ALBUMIN: 3 g/dL — AB (ref 3.5–5.0)
ALK PHOS: 51 U/L (ref 38–126)
ALT: 14 U/L (ref 0–44)
AST: 16 U/L (ref 15–41)
Anion gap: 12 (ref 5–15)
BILIRUBIN TOTAL: 0.2 mg/dL — AB (ref 0.3–1.2)
BUN: 6 mg/dL (ref 6–20)
CO2: 23 mmol/L (ref 22–32)
CREATININE: 0.58 mg/dL (ref 0.44–1.00)
Calcium: 9.6 mg/dL (ref 8.9–10.3)
Chloride: 100 mmol/L (ref 98–111)
GFR calc Af Amer: >60 mL/min (ref >60)
GLUCOSE: 82 mg/dL (ref 70–99)
POTASSIUM: 3.3 mmol/L — AB (ref 3.5–5.1)
Sodium: 135 mmol/L (ref 135–145)
TOTAL PROTEIN: 6.2 g/dL — AB (ref 6.5–8.1)

## 2018-08-05 MED ORDER — ONDANSETRON HCL 4 MG/2ML IJ SOLN
4.0000 mg | Freq: Four times a day (QID) | INTRAMUSCULAR | Status: DC | PRN
  Administered 2018-08-05: 4 mg via INTRAVENOUS
  Filled 2018-08-05: qty 2

## 2018-08-05 MED ORDER — PROMETHAZINE HCL 25 MG PO TABS
25.0000 mg | ORAL_TABLET | Freq: Four times a day (QID) | ORAL | Status: DC | PRN
  Administered 2018-08-05: 25 mg via ORAL
  Filled 2018-08-05: qty 1

## 2018-08-05 NOTE — Progress Notes (Signed)
FACULTY PRACTICE ANTEPARTUM PROGRESS NOTE  [name] is a 37 y.o. [id number] at 2342w5d who is admitted for RLQ pain and preterm contractions.  Estimated Date of Delivery: 10/30/18 Fetal presentation is cephalic.  Length of Stay:  1 Days. Admitted 08/04/2018  Subjective:  Patient reports normal fetal movement.  She reports intermittent uterine contractions, sometimes worse than others, will have periods where they are more painful and regular than others. Denies leaking/bleeding. She is still having RLQ pain, states it is still as painful as it was before and is now more regular rather than intermittent. Still with some nausea/vomiting. No other complaints  Vitals:  Blood pressure 108/60, pulse 92, temperature 98 F (36.7 C), temperature source Oral, resp. rate 16, height 5\' 3"  (1.6 m), weight 81.2 kg, last menstrual period 01/12/2018, SpO2 98 %. Physical Examination: CONSTITUTIONAL: Well-developed, well-nourished female in no acute distress.  HENT:  Normocephalic, atraumatic, External right and left ear normal. Oropharynx is clear and moist EYES: Conjunctivae and EOM are normal. Pupils are equal, round, and reactive to light. No scleral icterus.  NECK: Normal range of motion, supple, no masses. SKIN: Skin is warm and dry. No rash noted. Not diaphoretic. No erythema. No pallor. NEUROLGIC: Alert and oriented to person, place, and time. Normal reflexes, muscle tone coordination. No cranial nerve deficit noted. PSYCHIATRIC: Normal mood and affect. Normal behavior. Normal judgment and thought content. CARDIOVASCULAR: Normal heart rate noted, regular rhythm RESPIRATORY: Effort and breath sounds normal, no problems with respiration noted MUSCULOSKELETAL: Normal range of motion. No edema and no tenderness. ABDOMEN: Soft, mild to moderately tender in RLQ CERVIX: 1/thick/soft  Fetal monitoring: FHR: 150 bpm, Variability: moderate, Accelerations: Present, Decelerations: Absent  Uterine  activity: irregular contractions, intermittently every 4-5 min   Current scheduled medications . docusate sodium  100 mg Oral Daily  . prenatal multivitamin  1 tablet Oral Q1200    I have reviewed the patient's current medications.  ASSESSMENT: Active Problems:   Preterm uterine contractions in third trimester, antepartum   Preterm contractions   PLAN: Patient contracting regularly overnight, somewhat improved. Cervix unchanged. She reports RLQ pain remains the same, still with some nausea/vomiting. US last pm negative for torsion. Will repeat lab work today. Reviewed case with Dr. Judeth CornfieldShankar of [facility]FM who recommends cervical length today.   Repeat CBC/CMP TVUS today Procardia prn  Continue routine antenatal care.   Baldemar LenisK. Meryl Christna Kulick, M.D. [facility]  08/05/2018 1:19 PM

## 2018-08-06 LAB — CULTURE, OB URINE

## 2018-08-06 LAB — RAPID URINE DRUG SCREEN, HOSP PERFORMED
Amphetamines: NOT DETECTED
BARBITURATES: NOT DETECTED
Benzodiazepines: NOT DETECTED
Cocaine: NOT DETECTED
Opiates: NOT DETECTED
Tetrahydrocannabinol: NOT DETECTED

## 2018-08-06 LAB — URINE CULTURE, OB REFLEX: Organism ID, Bacteria: NO GROWTH

## 2018-08-06 MED ORDER — PROMETHAZINE HCL 25 MG PO TABS: 25.0000 mg | ORAL_TABLET | Freq: Four times a day (QID) | ORAL | 2 refills | Status: DC | PRN

## 2018-08-06 MED ORDER — PROMETHAZINE HCL 25 MG PO TABS: 25.0000 mg | ORAL_TABLET | Freq: Four times a day (QID) | ORAL | 0 refills | Status: DC | PRN

## 2018-08-06 NOTE — Discharge Summary (Signed)
Antenatal Physician Discharge Summary  Patient ID: [name] MRN: [id number] DOB/AGE: 38/12/1980 38 y.o.  Admit date: 08/04/2018 Discharge date: 08/06/2018  Admission Diagnoses: RLQ pain, preterm contractions  Discharge Diagnoses:  Active Problems:   Preterm uterine contractions in third trimester, antepartum   Preterm contractions   Prenatal Procedures: NST and ultrasound  Consults: Maternal Fetal Medicine  Hospital Course:  This is a 37 y.o. [id number] with IUP at 8475w6d admitted for right lower quadrant pain and pre-term contractions. Also with some nausea. Her contractions have spaced out significantly and no cervical change made over the course of her stay. She received procardia intermittently. She is still having RLQ pain but thinks it may be baby kicking her in that area. Ultrasound showed no evidence of torsion, patient with h/o appendectomy. White count remained normal and patient remains afebrile. Urine culture negative. With no clear source for pain, will discharge home with instructions to return for any worsening pain or symptoms. Reactive NST prior to discharge. She will f/u with her usual appointments, to continue Makena injections weekly. UDS pending.  Discharge Exam: Temp:  [97.7 F (36.5 C)-98.8 F (37.1 C)] 98.6 F (37 C) (08/15 1224) Pulse Rate:  [85-100] 86 (08/15 1224) Resp:  [18-20] 18 (08/15 1224) BP: (111-124)/(59-77) 111/59 (08/15 1224) SpO2:  [95 %-99 %] 95 % (08/15 1224) Physical Examination: CONSTITUTIONAL: Well-developed, well-nourished female in no acute distress.  HENT:  Normocephalic, atraumatic, External right and left ear normal. Oropharynx is clear and moist EYES: Conjunctivae and EOM are normal. Pupils are equal, round, and reactive to light. No scleral icterus.  NECK: Normal range of motion, supple, no masses SKIN: Skin is warm and dry. No rash noted. Not diaphoretic. No erythema. No pallor. NEUROLGIC: Alert and oriented to person,  place, and time. Normal reflexes, muscle tone coordination. No cranial nerve deficit noted. PSYCHIATRIC: Normal mood and affect. Normal behavior. Normal judgment and thought content. CARDIOVASCULAR: Normal heart rate noted, regular rhythm RESPIRATORY: Effort and breath sounds normal, no problems with respiration noted MUSCULOSKELETAL: Normal range of motion. No edema and no tenderness. 2+ distal pulses. ABDOMEN: Soft, mildly tender RLQ, nondistended, gravid. CERVIX: Dilation: 1 Effacement (%): Thick Cervical Position: Posterior Station: -2 Presentation: Vertex Exam by:: Weston,RN  Fetal monitoring: FHR: 140s bpm, Variability: moderate, Accelerations: Present, Decelerations: Absent  Uterine activity: irregular contractions  Significant Diagnostic Studies:  Results for orders placed or performed during the hospital encounter of 08/04/18 (from the past 168 hour(s))  Urinalysis, Routine w reflex microscopic   Collection Time: 08/04/18 10:02 AM  Result Value Ref Range   Color, Urine YELLOW YELLOW   APPearance HAZY (A) CLEAR   Specific Gravity, Urine 1.009 1.005 - 1.030   pH 7.0 5.0 - 8.0   Glucose, UA NEGATIVE NEGATIVE mg/dL   Hgb urine dipstick NEGATIVE NEGATIVE   Bilirubin Urine NEGATIVE NEGATIVE   Ketones, ur NEGATIVE NEGATIVE mg/dL   Protein, ur NEGATIVE NEGATIVE mg/dL   Nitrite NEGATIVE NEGATIVE   Leukocytes, UA NEGATIVE NEGATIVE  Fetal fibronectin   Collection Time: 08/04/18 10:43 AM  Result Value Ref Range   Fetal Fibronectin NEGATIVE NEGATIVE  CBC with Differential/Platelet   Collection Time: 08/04/18  2:00 PM  Result Value Ref Range   WBC 8.5 4.0 - 10.5 K/uL   RBC 3.22 (L) 3.87 - 5.11 MIL/uL   Hemoglobin 9.4 (L) 12.0 - 15.0 g/dL   HCT 62.128.7 (L) 30.836.0 - 65.746.0 %   MCV 89.1 78.0 - 100.0 fL   MCH 29.2 26.0 -  34.0 pg   MCHC 32.8 30.0 - 36.0 g/dL   RDW 40.919.2 (H) 81.111.5 - 91.415.5 %   Platelets 203 150 - 400 K/uL   Neutrophils Relative % 77 %   Neutro Abs 6.6 1.7 - 7.7 K/uL    Lymphocytes Relative 18 %   Lymphs Abs 1.5 0.7 - 4.0 K/uL   Monocytes Relative 3 %   Monocytes Absolute 0.2 0.1 - 1.0 K/uL   Eosinophils Relative 2 %   Eosinophils Absolute 0.2 0.0 - 0.7 K/uL   Basophils Relative 0 %   Basophils Absolute 0.0 0.0 - 0.1 K/uL  Comprehensive metabolic panel   Collection Time: 08/04/18  2:00 PM  Result Value Ref Range   Sodium 136 135 - 145 mmol/L   Potassium 3.8 3.5 - 5.1 mmol/L   Chloride 105 98 - 111 mmol/L   CO2 21 (L) 22 - 32 mmol/L   Glucose, Bld 85 70 - 99 mg/dL   BUN 7 6 - 20 mg/dL   Creatinine, Ser 7.820.59 0.44 - 1.00 mg/dL   Calcium 9.5 8.9 - 95.610.3 mg/dL   Total Protein 6.2 (L) 6.5 - 8.1 g/dL   Albumin 2.9 (L) 3.5 - 5.0 g/dL   AST 14 (L) 15 - 41 U/L   ALT 13 0 - 44 U/L   Alkaline Phosphatase 53 38 - 126 U/L   Total Bilirubin 0.4 0.3 - 1.2 mg/dL   GFR calc non Af Amer >60 >60 mL/min   GFR calc Af Amer >60 >60 mL/min   Anion gap 10 5 - 15  Amylase   Collection Time: 08/04/18  2:01 PM  Result Value Ref Range   Amylase 85 28 - 100 U/L  Lipase, blood   Collection Time: 08/04/18  2:01 PM  Result Value Ref Range   Lipase 41 11 - 51 U/L  Type and screen [facility]   Collection Time: 08/04/18  4:16 PM  Result Value Ref Range   ABO/RH(D) O POS    Antibody Screen NEG    Sample Expiration      08/07/2018 Performed at [facility], [address] [place], [place] [address]   CBC   Collection Time: 08/05/18  2:31 PM  Result Value Ref Range   WBC 7.5 4.0 - 10.5 K/uL   RBC 3.15 (L) 3.87 - 5.11 MIL/uL   Hemoglobin 9.2 (L) 12.0 - 15.0 g/dL   HCT 65.728.0 (L) 84.636.0 - 96.246.0 %   MCV 88.9 78.0 - 100.0 fL   MCH 29.2 26.0 - 34.0 pg   MCHC 32.9 30.0 - 36.0 g/dL   RDW 95.219.3 (H) 84.111.5 - 32.415.5 %   Platelets 188 150 - 400 K/uL  Comprehensive metabolic panel   Collection Time: 08/05/18  2:31 PM  Result Value Ref Range   Sodium 135 135 - 145 mmol/L   Potassium 3.3 (L) 3.5 - 5.1 mmol/L   Chloride 100 98 - 111 mmol/L   CO2 23 22 - 32  mmol/L   Glucose, Bld 82 70 - 99 mg/dL   BUN 6 6 - 20 mg/dL   Creatinine, Ser 4.010.58 0.44 - 1.00 mg/dL   Calcium 9.6 8.9 - 02.710.3 mg/dL   Total Protein 6.2 (L) 6.5 - 8.1 g/dL   Albumin 3.0 (L) 3.5 - 5.0 g/dL   AST 16 15 - 41 U/L   ALT 14 0 - 44 U/L   Alkaline Phosphatase 51 38 - 126 U/L   Total Bilirubin 0.2 (L) 0.3 - 1.2  mg/dL   GFR calc non Af Amer >60 >60 mL/min   GFR calc Af Amer >60 >60 mL/min   Anion gap 12 5 - 15  Results for orders placed or performed in visit on 08/04/18 (from the past 168 hour(s))  Culture, OB Urine   Collection Time: 08/04/18  9:07 AM  Result Value Ref Range   Urine Culture, OB Final report   Urine Culture, OB Reflex   Collection Time: 08/04/18  9:07 AM  Result Value Ref Range   Organism ID, Bacteria No growth     Discharge Condition: Stable  Disposition: Discharge disposition: 01-Home or Self Care        Discharge Instructions    Notify physician for a general feeling that "something is not right"   Complete by:  As directed    Notify physician for increase or change in vaginal discharge   Complete by:  As directed    Notify physician for intestinal cramps, with or without diarrhea, sometimes described as "gas pain"   Complete by:  As directed    Notify physician for leaking of fluid   Complete by:  As directed    Notify physician for low, dull backache, unrelieved by heat or Tylenol   Complete by:  As directed    Notify physician for menstrual like cramps   Complete by:  As directed    Notify physician for pelvic pressure   Complete by:  As directed    Notify physician for uterine contractions.  These may be painless and feel like the uterus is tightening or the baby is  "balling up"   Complete by:  As directed    Notify physician for vaginal bleeding   Complete by:  As directed    PRETERM LABOR:  Includes any of the follwing symptoms that occur between 20 - [redacted] weeks gestation.  If these symptoms are not stopped, preterm labor can result  in preterm delivery, placing your baby at risk   Complete by:  As directed      Allergies as of 08/06/2018   No Known Allergies     Medication List    STOP taking these medications   ibuprofen 400 MG tablet Commonly known as:  ADVIL,MOTRIN   phenazopyridine 200 MG tablet Commonly known as:  PYRIDIUM   promethazine 25 MG suppository Commonly known as:  PHENERGAN Replaced by:  promethazine 25 MG tablet You also have another medication with the same name that you need to continue taking as instructed.   terconazole 0.8 % vaginal cream Commonly known as:  TERAZOL 3     TAKE these medications   aspirin EC 81 MG tablet Take 1 tablet (81 mg total) by mouth daily.   COMFORT FIT MATERNITY SUPP LG Misc 1 Units by Does not apply route daily.   Doxylamine-Pyridoxine 10-10 MG Tbec Take 1 tablet with breakfast and lunch.  Take 2 tablets at bedtime.   ferrous fumarate 325 (106 Fe) MG Tabs tablet Commonly known as:  HEMOCYTE - 106 mg FE Take 1 tablet (106 mg of iron total) by mouth 2 (two) times daily.   pantoprazole 40 MG tablet Commonly known as:  PROTONIX Take 1 tablet (40 mg total) by mouth daily.   prenatal multivitamin Tabs tablet Take 1 tablet by mouth daily at 12 noon.   promethazine 25 MG tablet Commonly known as:  PHENERGAN Take 1 tablet (25 mg total) by mouth every 6 (six) hours as needed for nausea or vomiting. What changed:  medication strength  how much to take  Another medication with the same name was removed. Continue taking this medication, and follow the directions you see here.   promethazine 25 MG tablet Commonly known as:  PHENERGAN Take 1 tablet (25 mg total) by mouth every 6 (six) hours as needed for nausea or vomiting. What changed:  You were already taking a medication with the same name, and this prescription was added. Make sure you understand how and when to take each. Replaces:  promethazine 25 MG suppository      Follow-up Information     [facility]. Go to.   Specialty:  Obstetrics and Gynecology Why:  regularly scheduled appointment Contact information: [address], [address] [place] [address] [phone]          Signed: Baldemar Lenis, M.D. [facility]  08/06/2018, 2:42 PM

## 2018-08-06 NOTE — Discharge Instructions (Signed)

## 2018-08-10 LAB — TOXASSURE SELECT 13 (MW), URINE

## 2018-08-11 ENCOUNTER — Encounter: Payer: Self-pay | Admitting: Obstetrics and Gynecology

## 2018-08-11 ENCOUNTER — Ambulatory Visit (INDEPENDENT_AMBULATORY_CARE_PROVIDER_SITE_OTHER): Payer: Medicaid Other | Admitting: Obstetrics and Gynecology

## 2018-08-11 ENCOUNTER — Other Ambulatory Visit: Payer: Medicaid Other

## 2018-08-11 VITALS — BP 135/84 | HR 97 | Wt 176.6 lb

## 2018-08-11 DIAGNOSIS — O09213 Supervision of pregnancy with history of pre-term labor, third trimester: Secondary | ICD-10-CM

## 2018-08-11 DIAGNOSIS — O4700 False labor before 37 completed weeks of gestation, unspecified trimester: Secondary | ICD-10-CM

## 2018-08-11 DIAGNOSIS — O479 False labor, unspecified: Secondary | ICD-10-CM

## 2018-08-11 DIAGNOSIS — Z23 Encounter for immunization: Secondary | ICD-10-CM

## 2018-08-11 DIAGNOSIS — O0990 Supervision of high risk pregnancy, unspecified, unspecified trimester: Secondary | ICD-10-CM

## 2018-08-11 DIAGNOSIS — O09892 Supervision of other high risk pregnancies, second trimester: Secondary | ICD-10-CM

## 2018-08-11 DIAGNOSIS — O0993 Supervision of high risk pregnancy, unspecified, third trimester: Secondary | ICD-10-CM

## 2018-08-11 DIAGNOSIS — O4703 False labor before 37 completed weeks of gestation, third trimester: Secondary | ICD-10-CM

## 2018-08-11 DIAGNOSIS — O09523 Supervision of elderly multigravida, third trimester: Secondary | ICD-10-CM

## 2018-08-11 DIAGNOSIS — I10 Essential (primary) hypertension: Secondary | ICD-10-CM

## 2018-08-11 DIAGNOSIS — O09212 Supervision of pregnancy with history of pre-term labor, second trimester: Secondary | ICD-10-CM

## 2018-08-11 DIAGNOSIS — Z3009 Encounter for other general counseling and advice on contraception: Secondary | ICD-10-CM | POA: Insufficient documentation

## 2018-08-11 DIAGNOSIS — N83201 Unspecified ovarian cyst, right side: Secondary | ICD-10-CM

## 2018-08-11 HISTORY — DX: Encounter for other general counseling and advice on contraception: Z30.09

## 2018-08-11 NOTE — Progress Notes (Signed)
Subjective:  [name] is a 37 y.o. [id number] at 2956w4d being seen today for ongoing prenatal care.  She is currently monitored for the following issues for this high-risk pregnancy and has HTN (hypertension); Right ovarian cyst; Hypokalemia; Anemia; Cocaine use; TIA (transient ischemic attack); Acute left-sided weakness; Prediabetes; HLD (hyperlipidemia); Supervision of high risk pregnancy, antepartum; Advanced maternal age in multigravida; History of preterm delivery, currently pregnant in second trimester; Kidney stones; History of cocaine use; Preterm contractions; and Unwanted fertility on their problem list.  Patient reports hospitalized from 8/13--15 for pain and ut ctx. Sx have resolved. .  Contractions: Irregular. Vag. Bleeding: None.  Movement: Present. Denies leaking of fluid.   The following portions of the patient's history were reviewed and updated as appropriate: allergies, current medications, past family history, past medical history, past social history, past surgical history and problem list. Problem list updated.  Objective:   Vitals:   08/11/18 0944  BP: 135/84  Pulse: 97  Weight: 176 lb 9.6 oz (80.1 kg)    Fetal Status: Fetal Heart Rate (bpm): 156 Fundal Height: 29 cm Movement: Present     General:  Alert, oriented and cooperative. Patient is in no acute distress.  Skin: Skin is warm and dry. No rash noted.   Cardiovascular: Normal heart rate noted  Respiratory: Normal respiratory effort, no problems with respiration noted  Abdomen: Soft, gravid, appropriate for gestational age. Pain/Pressure: Present     Pelvic:  Cervical exam performed Dilation: 1      Extremities: Normal range of motion.  Edema: None  Mental Status: Normal mood and affect. Normal behavior. Normal judgment and thought content.   Urinalysis:      Assessment and Plan:  Pregnancy: [id number] at 1456w4d  1. Supervision of high risk pregnancy, antepartum Stable - Flu Vaccine QUAD 36+ mos IM  (Fluarix, Quad PF) - Glucose Tolerance, 2 Hours w/1 Hour - CBC - HIV antibody - RPR  2. Preterm contractions Stable Continue with 17 OHP No cervical changes since discharge from hospital  3. Essential hypertension BP stable BASA qd - US MFM OB FOLLOW UP; Future  4. History of preterm delivery, currently pregnant in second trimester 17 OHP weekly  5. Multigravida of advanced maternal age in third trimester   6. Right ovarian cyst Simple cyst, stable  7. Unwanted fertility BTL papers signed today  Preterm labor symptoms and general obstetric precautions including but not limited to vaginal bleeding, contractions, leaking of fluid and fetal movement were reviewed in detail with the patient. Please refer to After Visit Summary for other counseling recommendations.  Return in about 2 weeks (around 08/25/2018) for OB visit.   Hermina StaggersErvin, Kinan Safley L, MD

## 2018-08-11 NOTE — Progress Notes (Signed)
Patient reports good fetal movement. Pt states that she had some contractions yesterday and had some very light spotting, but none today. Administered 17-p in R arm, and pt tolerated well

## 2018-08-12 LAB — CBC
HEMATOCRIT: 32.5 % — AB (ref 34.0–46.6)
Hemoglobin: 10.2 g/dL — ABNORMAL LOW (ref 11.1–15.9)
MCH: 28.8 pg (ref 26.6–33.0)
MCHC: 31.4 g/dL — ABNORMAL LOW (ref 31.5–35.7)
MCV: 92 fL (ref 79–97)
Platelets: 236 10*3/uL (ref 150–450)
RBC: 3.54 x10E6/uL — ABNORMAL LOW (ref 3.77–5.28)
RDW: 19.8 % — ABNORMAL HIGH (ref 12.3–15.4)
WBC: 9.6 10*3/uL (ref 3.4–10.8)

## 2018-08-12 LAB — HIV ANTIBODY (ROUTINE TESTING W REFLEX): HIV Screen 4th Generation wRfx: NONREACTIVE

## 2018-08-12 LAB — GLUCOSE TOLERANCE, 2 HOURS W/ 1HR
GLUCOSE, 2 HOUR: 130 mg/dL (ref 65–152)
Glucose, 1 hour: 207 mg/dL — ABNORMAL HIGH (ref 65–179)
Glucose, Fasting: 98 mg/dL — ABNORMAL HIGH (ref 65–91)

## 2018-08-12 LAB — RPR: RPR: NONREACTIVE

## 2018-08-13 ENCOUNTER — Other Ambulatory Visit: Payer: Self-pay

## 2018-08-13 DIAGNOSIS — O24419 Gestational diabetes mellitus in pregnancy, unspecified control: Secondary | ICD-10-CM

## 2018-08-18 ENCOUNTER — Ambulatory Visit (HOSPITAL_COMMUNITY): Payer: Medicaid Other

## 2018-08-19 ENCOUNTER — Ambulatory Visit (INDEPENDENT_AMBULATORY_CARE_PROVIDER_SITE_OTHER): Payer: Medicaid Other

## 2018-08-19 ENCOUNTER — Ambulatory Visit (HOSPITAL_COMMUNITY): Admission: RE | Admit: 2018-08-19 | Payer: Medicaid Other | Source: Ambulatory Visit

## 2018-08-19 ENCOUNTER — Ambulatory Visit (HOSPITAL_COMMUNITY): Payer: Medicaid Other

## 2018-08-19 DIAGNOSIS — O09213 Supervision of pregnancy with history of pre-term labor, third trimester: Secondary | ICD-10-CM | POA: Diagnosis not present

## 2018-08-19 DIAGNOSIS — O0990 Supervision of high risk pregnancy, unspecified, unspecified trimester: Secondary | ICD-10-CM

## 2018-08-19 NOTE — Progress Notes (Signed)
Nurse visit for pt supplied Makena given L arm w/o difficulty.

## 2018-08-25 ENCOUNTER — Other Ambulatory Visit: Payer: Self-pay

## 2018-08-25 ENCOUNTER — Ambulatory Visit (INDEPENDENT_AMBULATORY_CARE_PROVIDER_SITE_OTHER): Payer: Medicaid Other | Admitting: Family Medicine

## 2018-08-25 VITALS — BP 138/83 | HR 102 | Temp 97.7°F | Wt 175.3 lb

## 2018-08-25 DIAGNOSIS — O09892 Supervision of other high risk pregnancies, second trimester: Secondary | ICD-10-CM

## 2018-08-25 DIAGNOSIS — I10 Essential (primary) hypertension: Secondary | ICD-10-CM

## 2018-08-25 DIAGNOSIS — O0990 Supervision of high risk pregnancy, unspecified, unspecified trimester: Secondary | ICD-10-CM

## 2018-08-25 DIAGNOSIS — O09212 Supervision of pregnancy with history of pre-term labor, second trimester: Secondary | ICD-10-CM

## 2018-08-25 DIAGNOSIS — O24410 Gestational diabetes mellitus in pregnancy, diet controlled: Secondary | ICD-10-CM

## 2018-08-25 DIAGNOSIS — O219 Vomiting of pregnancy, unspecified: Secondary | ICD-10-CM

## 2018-08-25 MED ORDER — ONDANSETRON 4 MG PO TBDP: 4.0000 mg | ORAL_TABLET | Freq: Four times a day (QID) | ORAL | 0 refills | Status: DC | PRN

## 2018-08-25 MED ORDER — PROMETHAZINE HCL 25 MG RE SUPP: 25.0000 mg | Freq: Four times a day (QID) | RECTAL | 0 refills | Status: DC | PRN

## 2018-08-25 NOTE — Patient Instructions (Signed)

## 2018-08-25 NOTE — Progress Notes (Signed)
   PRENATAL VISIT NOTE  Subjective:  [name] is a 37 y.o. [id number] at [redacted]w[redacted]d being seen today for ongoing prenatal care.  She is currently monitored for the following issues for this high-risk pregnancy and has HTN (hypertension); Right ovarian cyst; Anemia; Gestational diabetes mellitus, class A1; Cocaine use; TIA (transient ischemic attack); Acute left-sided weakness; Prediabetes; HLD (hyperlipidemia); Supervision of high risk pregnancy, antepartum; Advanced maternal age in multigravida; History of preterm delivery, currently pregnant in second trimester; Kidney stones; History of cocaine use; Preterm contractions; and Unwanted fertility on their problem list.  Patient reports nausea and vomiting, cannot keep anything down x 24 hours now. No meds will stay down.  Contractions: Irregular. Vag. Bleeding: None.  Movement: Present. Denies leaking of fluid.   The following portions of the patient's history were reviewed and updated as appropriate: allergies, current medications, past family history, past medical history, past social history, past surgical history and problem list. Problem list updated.  Objective:   Vitals:   08/25/18 1524  BP: 138/83  Pulse: (!) 102  Temp: 97.7 F (36.5 C)  Weight: 175 lb 4.8 oz (79.5 kg)    Fetal Status: Fetal Heart Rate (bpm): 154 Fundal Height: 32 cm Movement: Present     General:  Alert, oriented and cooperative. Patient is in no acute distress.  Skin: Skin is warm and dry. No rash noted.   Cardiovascular: Normal heart rate noted  Respiratory: Normal respiratory effort, no problems with respiration noted  Abdomen: Soft, gravid, appropriate for gestational age.  Pain/Pressure: Present     Pelvic: Cervical exam deferred        Extremities: Normal range of motion.  Edema: None  Mental Status: Normal mood and affect. Normal behavior. Normal judgment and thought content.   Assessment and Plan:  Pregnancy: [id number] at [redacted]w[redacted]d  1. History of  preterm delivery, currently pregnant in second trimester Continue 17 P  2. Gestational diabetes mellitus, class A1 Has not seen D & N Mgmt as yet--advised to avoid sweets and carbs.  3. Essential hypertension BP ok on no meds right now Continue ASA Has u/s for growth tomorrow  4. Supervision of high risk pregnancy, antepartum   5. Nausea/vomiting in pregnancy Trial of phenergan supp and zofran ODT--if no improvement, may need IVF at [facility]. No diarrhea, fever, s/p appy 10 years ago. No CVA tenderness, no RUQ tenderness---? Pregnancy related? - promethazine (PHENERGAN) 25 MG suppository; Place 1 suppository (25 mg total) rectally every 6 (six) hours as needed for nausea or vomiting.  Dispense: 12 each; Refill: 0 - ondansetron (ZOFRAN ODT) 4 MG disintegrating tablet; Take 1 tablet (4 mg total) by mouth every 6 (six) hours as needed for nausea.  Dispense: 20 tablet; Refill: 0  Preterm labor symptoms and general obstetric precautions including but not limited to vaginal bleeding, contractions, leaking of fluid and fetal movement were reviewed in detail with the patient. Please refer to After Visit Summary for other counseling recommendations.  Return in 2 weeks (on 09/08/2018).  Future Appointments  Date Time Provider Department Center  08/26/2018  3:30 PM WH-MFC [place] 1 WH-MFCUS MFC-US  08/27/2018 10:00 AM WOC-EDUCATION WOC-WOCA WOC  09/02/2018  8:45 AM NDM-NMCH GDM CLASS NDM-NMCH NDM    Reva Bores, MD

## 2018-08-25 NOTE — Progress Notes (Addendum)
ROB.  C/o vomiting x 2 days, patient threw up in the office.  She also complains of pressure and back pain 8/10 x 2 days.  17P given in left deltoid, tolerated well.  Administrations This Visit    HYDROXYprogesterone Caproate SOAJ 275 mg    Admin Date 08/25/2018 Action Given Dose 275 mg Route Subcutaneous Administered By Maretta Bees, RMA

## 2018-08-26 ENCOUNTER — Ambulatory Visit (HOSPITAL_COMMUNITY)
Admission: RE | Admit: 2018-08-26 | Discharge: 2018-08-26 | Disposition: A | Discharge status: Home / Self Care | Payer: Medicaid Other | Source: Ambulatory Visit | Attending: Obstetrics and Gynecology | Admitting: Obstetrics and Gynecology

## 2018-08-26 ENCOUNTER — Encounter (HOSPITAL_COMMUNITY): Payer: Self-pay

## 2018-08-26 ENCOUNTER — Other Ambulatory Visit: Payer: Self-pay | Admitting: Obstetrics and Gynecology

## 2018-08-26 ENCOUNTER — Inpatient Hospital Stay (HOSPITAL_COMMUNITY)
Admission: AD | Admit: 2018-08-26 | Discharge: 2018-08-27 | Disposition: A | Discharge status: Home / Self Care | Payer: Medicaid Other | Source: Ambulatory Visit | Attending: Obstetrics & Gynecology | Admitting: Obstetrics & Gynecology

## 2018-08-26 DIAGNOSIS — O09293 Supervision of pregnancy with other poor reproductive or obstetric history, third trimester: Secondary | ICD-10-CM | POA: Diagnosis not present

## 2018-08-26 DIAGNOSIS — O4703 False labor before 37 completed weeks of gestation, third trimester: Secondary | ICD-10-CM | POA: Diagnosis not present

## 2018-08-26 DIAGNOSIS — O26893 Other specified pregnancy related conditions, third trimester: Secondary | ICD-10-CM | POA: Diagnosis present

## 2018-08-26 DIAGNOSIS — Z3A30 30 weeks gestation of pregnancy: Secondary | ICD-10-CM | POA: Diagnosis not present

## 2018-08-26 DIAGNOSIS — O10913 Unspecified pre-existing hypertension complicating pregnancy, third trimester: Secondary | ICD-10-CM | POA: Insufficient documentation

## 2018-08-26 DIAGNOSIS — Z79899 Other long term (current) drug therapy: Secondary | ICD-10-CM | POA: Diagnosis not present

## 2018-08-26 DIAGNOSIS — O219 Vomiting of pregnancy, unspecified: Secondary | ICD-10-CM

## 2018-08-26 DIAGNOSIS — O212 Late vomiting of pregnancy: Secondary | ICD-10-CM | POA: Insufficient documentation

## 2018-08-26 DIAGNOSIS — F1491 Cocaine use, unspecified, in remission: Secondary | ICD-10-CM

## 2018-08-26 DIAGNOSIS — I10 Essential (primary) hypertension: Secondary | ICD-10-CM

## 2018-08-26 DIAGNOSIS — Z87898 Personal history of other specified conditions: Secondary | ICD-10-CM

## 2018-08-26 DIAGNOSIS — O09213 Supervision of pregnancy with history of pre-term labor, third trimester: Secondary | ICD-10-CM | POA: Diagnosis not present

## 2018-08-26 DIAGNOSIS — O09523 Supervision of elderly multigravida, third trimester: Secondary | ICD-10-CM | POA: Diagnosis not present

## 2018-08-26 DIAGNOSIS — R109 Unspecified abdominal pain: Secondary | ICD-10-CM | POA: Diagnosis present

## 2018-08-26 LAB — URINALYSIS, ROUTINE W REFLEX MICROSCOPIC
Bacteria, UA: NONE SEEN
Bilirubin Urine: NEGATIVE
GLUCOSE, UA: NEGATIVE mg/dL
HGB URINE DIPSTICK: NEGATIVE
Ketones, ur: NEGATIVE mg/dL
Nitrite: NEGATIVE
PH: 7 (ref 5.0–8.0)
PROTEIN: NEGATIVE mg/dL
SPECIFIC GRAVITY, URINE: 1.017 (ref 1.005–1.030)

## 2018-08-26 NOTE — MAU Note (Signed)
Pt here with c/o vaginal pressure. Denies any bleeding or leaking. Lost some of mucus plug. Having some nausea as well. Reports good fetal movement. Has had hx of pre-eclampsia.

## 2018-08-27 ENCOUNTER — Other Ambulatory Visit: Payer: Self-pay | Admitting: *Deleted

## 2018-08-27 ENCOUNTER — Encounter (HOSPITAL_COMMUNITY): Payer: Self-pay | Admitting: Student

## 2018-08-27 ENCOUNTER — Other Ambulatory Visit: Payer: Medicaid Other

## 2018-08-27 DIAGNOSIS — O219 Vomiting of pregnancy, unspecified: Secondary | ICD-10-CM

## 2018-08-27 DIAGNOSIS — O24410 Gestational diabetes mellitus in pregnancy, diet controlled: Secondary | ICD-10-CM

## 2018-08-27 DIAGNOSIS — O0990 Supervision of high risk pregnancy, unspecified, unspecified trimester: Secondary | ICD-10-CM

## 2018-08-27 DIAGNOSIS — O4703 False labor before 37 completed weeks of gestation, third trimester: Secondary | ICD-10-CM

## 2018-08-27 DIAGNOSIS — I10 Essential (primary) hypertension: Secondary | ICD-10-CM

## 2018-08-27 DIAGNOSIS — Z3A30 30 weeks gestation of pregnancy: Secondary | ICD-10-CM

## 2018-08-27 LAB — WET PREP, GENITAL
CLUE CELLS WET PREP: NONE SEEN
Sperm: NONE SEEN
Trich, Wet Prep: NONE SEEN
Yeast Wet Prep HPF POC: NONE SEEN

## 2018-08-27 LAB — GC/CHLAMYDIA PROBE AMP (~~LOC~~) NOT AT ARMC
CHLAMYDIA, DNA PROBE: NEGATIVE
NEISSERIA GONORRHEA: NEGATIVE

## 2018-08-27 LAB — FETAL FIBRONECTIN: FETAL FIBRONECTIN: NEGATIVE

## 2018-08-27 MED ORDER — ONDANSETRON 8 MG PO TBDP
8.0000 mg | ORAL_TABLET | Freq: Once | ORAL | Status: AC
  Administered 2018-08-27: 8 mg via ORAL
  Filled 2018-08-27: qty 1

## 2018-08-27 NOTE — Progress Notes (Signed)
Orders entered for follow up u/s in 4 weeks.

## 2018-08-27 NOTE — Progress Notes (Signed)
Orders entered per Dr Alysia Penna / MFM recommendations.

## 2018-08-27 NOTE — MAU Provider Note (Signed)
Chief Complaint:  Abdominal Pain (pressure) and Nausea   First Provider Initiated Contact with Patient 08/27/18 0003     HPI: [name] is a 37 y.o. [id number] at [redacted]w[redacted]d who presents to maternity admissions reporting vaginal pressure, contractions, & n/v. Nausea/vomiting since Sunday. Got better after being prescribed phenergan & zofran by Dr. Shawnie Pons on Tuesday. Vomited 3 times this evening but hasn't taken antiemetics since 1 pm. Denies fever/chills, diarrhea.  Feeling "braxton hicks" contractions. This has been ongoing. No increase in pain today. Previously admitted for preterm contractions but had negative FFN & was discharged home.  Increase in vaginal pressure and burning tonight. No recent intercourse. Denies LOF, vaginal discharge, dysuria, or vaginal bleeding. Positive fetal movement.    Past Medical History:  Diagnosis Date  . GERD (gastroesophageal reflux disease)   . History of cocaine use   . Hypertension   . Kidney stones   . Ovarian cyst   . Pregnancy induced hypertension    OB History  Gravida Para Term Preterm AB Living  6 5 4 1   5   SAB TAB Ectopic Multiple Live Births        0 5    # Outcome Date GA Lbr Len/2nd Weight Sex Delivery Anes PTL Lv  6 Current           5 Preterm 01/13/16 [redacted]w[redacted]d 05:59 / 00:04 2310 g M Vag-Spont None  LIV  4 Term 01/10/14 [redacted]w[redacted]d 09:00 / 04:13 3496 g M Vag-Spont EPI  LIV     Birth Comments: Mom 85 y/o G4P4. Labs neg. Gestational HTN on [place]. Baby 37 w, NSVD, L hydronephrosis which was normal at 36 w U/S. Had phototherapy due to 10 bili at 24 hrs. Remained at 10. Requested repeat today.  Mom O+/ baby O+.   3 Term 01/10/06 [redacted]w[redacted]d  3402 g F Vag-Spont EPI  LIV  2 Term 09/02/01 [redacted]w[redacted]d  4139 g M Vag-Spont EPI  LIV  1 Term 01/24/97 [redacted]w[redacted]d  2835 g F Vag-Spont EPI  LIV   Past Surgical History:  Procedure Laterality Date  . APPENDECTOMY    . HEMORRHOID SURGERY N/A 01/19/2016   Procedure: EXTENSIVE HEMORRHOIDECTOMY;  Surgeon: Franky Macho, MD;  Location: [facility];  Service: General;  Laterality: N/A;  . kidney stents    . LITHOTRIPSY     Family History  Problem Relation Age of Onset  . Cancer Mother        breast  . Diabetes Maternal Grandmother    Social History   Tobacco Use  . Smoking status: Never Smoker  . Smokeless tobacco: Never Used  Substance Use Topics  . Alcohol use: No  . Drug use: No    Comment: Hx Cocaine use   No Known Allergies Facility-Administered Medications Prior to Admission  Medication Dose Route Frequency Provider Last Rate Last Dose  . HYDROXYprogesterone Caproate SOAJ 275 mg  275 mg Subcutaneous Weekly Denney, Rachelle A, CNM   275 mg at 08/25/18 1516   Medications Prior to Admission  Medication Sig Dispense Refill Last Dose  . acetaminophen (TYLENOL) 325 MG tablet Take 650 mg by mouth every 6 (six) hours as needed.   08/26/2018 at 1900  . Prenatal Vit-Fe Fumarate-FA (PRENATAL MULTIVITAMIN) TABS tablet Take 1 tablet by mouth daily at 12 noon.   08/26/2018 at Unknown time  . promethazine (PHENERGAN) 25 MG suppository Place 1 suppository (25 mg total) rectally every 6 (six) hours as needed for nausea or vomiting. 12 each 0 08/26/2018  at 1300  . aspirin EC 81 MG tablet Take 1 tablet (81 mg total) by mouth daily. (Patient not taking: Reported on 07/07/2018) 100 tablet 2 Not Taking  . Doxylamine-Pyridoxine (DICLEGIS) 10-10 MG TBEC Take 1 tablet with breakfast and lunch.  Take 2 tablets at bedtime. 100 tablet 4 Taking  . Elastic Bandages & Supports (COMFORT FIT MATERNITY SUPP LG) MISC 1 Units by Does not apply route daily. 1 each 0 Taking  . ferrous fumarate (HEMOCYTE - 106 MG FE) 325 (106 Fe) MG TABS tablet Take 1 tablet (106 mg of iron total) by mouth 2 (two) times daily. (Patient not taking: Reported on 08/04/2018) 90 each 0 Not Taking  . ondansetron (ZOFRAN ODT) 4 MG disintegrating tablet Take 1 tablet (4 mg total) by mouth every 6 (six) hours as needed for nausea. 20 tablet 0 Taking  .  pantoprazole (PROTONIX) 40 MG tablet Take 1 tablet (40 mg total) by mouth daily. (Patient not taking: Reported on 08/04/2018) 30 tablet 6 Not Taking    I have reviewed patient's Past Medical Hx, Surgical Hx, Family Hx, Social Hx, medications and allergies.   ROS:  Review of Systems  Constitutional: Negative.   Gastrointestinal: Positive for abdominal pain, nausea and vomiting. Negative for constipation and diarrhea.  Genitourinary: Positive for vaginal pain. Negative for dysuria, vaginal bleeding and vaginal discharge.    Physical Exam   Patient Vitals for the past 24 hrs:  BP Temp Temp src Pulse Resp SpO2 Height Weight  08/26/18 2328 135/68 98.8 F (37.1 C) Oral 95 18 96 % 5\' 3"  (1.6 m) 80.7 kg    Constitutional: Well-developed, well-nourished female in no acute distress.  Cardiovascular: normal rate & rhythm, no murmur Respiratory: normal effort, lung sounds clear throughout GI: Abd soft, non-tender, gravid appropriate for gestational age. Pos BS x 4 MS: Extremities nontender, no edema, normal ROM Neurologic: Alert and oriented x 4.  GU:   Bilateral labial swelling. No erythema, discharge, or lesions Dilation: 1 Effacement (%): Thick Cervical Position: Posterior Exam by:: Judeth Horn NP    NST:  Baseline: 140 bpm, Variability: Good {> 6 bpm), Accelerations: Reactive and Decelerations: Absent   Labs: Results for orders placed or performed during the hospital encounter of 08/26/18 (from the past 24 hour(s))  Urinalysis, Routine w reflex microscopic     Status: Abnormal   Collection Time: 08/26/18 11:34 PM  Result Value Ref Range   Color, Urine YELLOW YELLOW   APPearance HAZY (A) CLEAR   Specific Gravity, Urine 1.017 1.005 - 1.030   pH 7.0 5.0 - 8.0   Glucose, UA NEGATIVE NEGATIVE mg/dL   Hgb urine dipstick NEGATIVE NEGATIVE   Bilirubin Urine NEGATIVE NEGATIVE   Ketones, ur NEGATIVE NEGATIVE mg/dL   Protein, ur NEGATIVE NEGATIVE mg/dL   Nitrite NEGATIVE NEGATIVE    Leukocytes, UA TRACE (A) NEGATIVE   WBC, UA 0-5 0 - 5 WBC/hpf   Bacteria, UA NONE SEEN NONE SEEN   Squamous Epithelial / LPF 6-10 0 - 5   Mucus PRESENT   Fetal fibronectin     Status: None   Collection Time: 08/27/18 12:09 AM  Result Value Ref Range   Fetal Fibronectin NEGATIVE NEGATIVE  Wet prep, genital     Status: Abnormal   Collection Time: 08/27/18 12:09 AM  Result Value Ref Range   Yeast Wet Prep HPF POC NONE SEEN NONE SEEN   Trich, Wet Prep NONE SEEN NONE SEEN   Clue Cells Wet Prep HPF POC NONE SEEN  NONE SEEN   WBC, Wet Prep HPF POC MODERATE (A) NONE SEEN   Sperm NONE SEEN      MAU Course: Orders Placed This Encounter  Procedures  . Wet prep, genital  . Urinalysis, Routine w reflex microscopic  . Fetal fibronectin  . Discharge patient   Meds ordered this encounter  Medications  . ondansetron (ZOFRAN-ODT) disintegrating tablet 8 mg    MDM: Irregular ctx on monitor. Pt denies pain with contractions, more pressure. Cervix unchanged. FFN negative Zofran given. No vomiting in MAU.   Assessment: 1. Preterm uterine contractions in third trimester, antepartum   2. [redacted] weeks gestation of pregnancy   3. Nausea and vomiting of pregnancy, antepartum     Plan: Discharge home in stable condition.  Preterm Labor precautions and fetal kick counts   Allergies as of 08/27/2018   No Known Allergies     Medication List    TAKE these medications   acetaminophen 325 MG tablet Commonly known as:  TYLENOL Take 650 mg by mouth every 6 (six) hours as needed.   aspirin EC 81 MG tablet Take 1 tablet (81 mg total) by mouth daily.   COMFORT FIT MATERNITY SUPP LG Misc 1 Units by Does not apply route daily.   Doxylamine-Pyridoxine 10-10 MG Tbec Take 1 tablet with breakfast and lunch.  Take 2 tablets at bedtime.   ferrous fumarate 325 (106 Fe) MG Tabs tablet Commonly known as:  HEMOCYTE - 106 mg FE Take 1 tablet (106 mg of iron total) by mouth 2 (two) times daily.    ondansetron 4 MG disintegrating tablet Commonly known as:  ZOFRAN-ODT Take 1 tablet (4 mg total) by mouth every 6 (six) hours as needed for nausea.   pantoprazole 40 MG tablet Commonly known as:  PROTONIX Take 1 tablet (40 mg total) by mouth daily.   prenatal multivitamin Tabs tablet Take 1 tablet by mouth daily at 12 noon.   promethazine 25 MG suppository Commonly known as:  PHENERGAN Place 1 suppository (25 mg total) rectally every 6 (six) hours as needed for nausea or vomiting.       Judeth Horn, NP 08/27/2018 1:30 AM

## 2018-08-27 NOTE — Discharge Instructions (Signed)
Braxton Hicks Contractions °Contractions of the uterus can occur throughout pregnancy, but they are not always a sign that you are in labor. You may have practice contractions called Braxton Hicks contractions. These false labor contractions are sometimes confused with true labor. °What are Braxton Hicks contractions? °Braxton Hicks contractions are tightening movements that occur in the muscles of the uterus before labor. Unlike true labor contractions, these contractions do not result in opening (dilation) and thinning of the cervix. Toward the end of pregnancy (32-34 weeks), Braxton Hicks contractions can happen more often and may become stronger. These contractions are sometimes difficult to tell apart from true labor because they can be very uncomfortable. You should not feel embarrassed if you go to the hospital with false labor. °Sometimes, the only way to tell if you are in true labor is for your health care provider to look for changes in the cervix. The health care provider will do a physical exam and may monitor your contractions. If you are not in true labor, the exam should show that your cervix is not dilating and your water has not broken. °If there are other health problems associated with your pregnancy, it is completely safe for you to be sent home with false labor. You may continue to have Braxton Hicks contractions until you go into true labor. °How to tell the difference between true labor and false labor °True labor °· Contractions last 30-70 seconds. °· Contractions become very regular. °· Discomfort is usually felt in the top of the uterus, and it spreads to the lower abdomen and low back. °· Contractions do not go away with walking. °· Contractions usually become more intense and increase in frequency. °· The cervix dilates and gets thinner. °False labor °· Contractions are usually shorter and not as strong as true labor contractions. °· Contractions are usually irregular. °· Contractions  are often felt in the front of the lower abdomen and in the groin. °· Contractions may go away when you walk around or change positions while lying down. °· Contractions get weaker and are shorter-lasting as time goes on. °· The cervix usually does not dilate or become thin. °Follow these instructions at home: °· Take over-the-counter and prescription medicines only as told by your health care provider. °· Keep up with your usual exercises and follow other instructions from your health care provider. °· Eat and drink lightly if you think you are going into labor. °· If Braxton Hicks contractions are making you uncomfortable: °? Change your position from lying down or resting to walking, or change from walking to resting. °? Sit and rest in a tub of warm water. °? Drink enough fluid to keep your urine pale yellow. Dehydration may cause these contractions. °? Do slow and deep breathing several times an hour. °· Keep all follow-up prenatal visits as told by your health care provider. This is important. °Contact a health care provider if: °· You have a fever. °· You have continuous pain in your abdomen. °Get help right away if: °· Your contractions become stronger, more regular, and closer together. °· You have fluid leaking or gushing from your vagina. °· You pass blood-tinged mucus (bloody show). °· You have bleeding from your vagina. °· You have low back pain that you never had before. °· You feel your baby’s head pushing down and causing pelvic pressure. °· Your baby is not moving inside you as much as it used to. °Summary °· Contractions that occur before labor are called Braxton   Hicks contractions, false labor, or practice contractions. °· Braxton Hicks contractions are usually shorter, weaker, farther apart, and less regular than true labor contractions. True labor contractions usually become progressively stronger and regular and they become more frequent. °· Manage discomfort from Braxton Hicks contractions by  changing position, resting in a warm bath, drinking plenty of water, or practicing deep breathing. °This information is not intended to replace advice given to you by your health care provider. Make sure you discuss any questions you have with your health care provider. °Document Released: 04/24/2017 Document Revised: 04/24/2017 Document Reviewed: 04/24/2017 °Elsevier Interactive Patient Education © 2018 [employer] ° °Fetal Movement Counts °Patient Name: ________________________________________________ Patient Due Date: ____________________ °What is a fetal movement count? °A fetal movement count is the number of times that you feel your baby move during a certain amount of time. This may also be called a fetal kick count. A fetal movement count is recommended for every pregnant woman. You may be asked to start counting fetal movements as early as week 28 of your pregnancy. °Pay attention to when your baby is most active. You may notice your baby's sleep and wake cycles. You may also notice things that make your baby move more. You should do a fetal movement count: °· When your baby is normally most active. °· At the same time each day. ° °A good time to count movements is while you are resting, after having something to eat and drink. °How do I count fetal movements? °1. Find a quiet, comfortable area. Sit, or lie down on your side. °2. Write down the date, the start time and stop time, and the number of movements that you felt between those two times. Take this information with you to your health care visits. °3. For 2 hours, count kicks, flutters, swishes, rolls, and jabs. You should feel at least 10 movements during 2 hours. °4. You may stop counting after you have felt 10 movements. °5. If you do not feel 10 movements in 2 hours, have something to eat and drink. Then, keep resting and counting for 1 hour. If you feel at least 4 movements during that hour, you may stop counting. °Contact a health care  provider if: °· You feel fewer than 4 movements in 2 hours. °· Your baby is not moving like he or she usually does. °Date: ____________ Start time: ____________ Stop time: ____________ Movements: ____________ °Date: ____________ Start time: ____________ Stop time: ____________ Movements: ____________ °Date: ____________ Start time: ____________ Stop time: ____________ Movements: ____________ °Date: ____________ Start time: ____________ Stop time: ____________ Movements: ____________ °Date: ____________ Start time: ____________ Stop time: ____________ Movements: ____________ °Date: ____________ Start time: ____________ Stop time: ____________ Movements: ____________ °Date: ____________ Start time: ____________ Stop time: ____________ Movements: ____________ °Date: ____________ Start time: ____________ Stop time: ____________ Movements: ____________ °Date: ____________ Start time: ____________ Stop time: ____________ Movements: ____________ °This information is not intended to replace advice given to you by your health care provider. Make sure you discuss any questions you have with your health care provider. °Document Released: 01/08/2007 Document Revised: 08/07/2016 Document Reviewed: 01/18/2016 °Elsevier Interactive Patient Education © 2018 [employer] ° °

## 2018-09-01 ENCOUNTER — Telehealth: Payer: Self-pay

## 2018-09-01 ENCOUNTER — Ambulatory Visit (INDEPENDENT_AMBULATORY_CARE_PROVIDER_SITE_OTHER): Payer: Medicaid Other

## 2018-09-01 VITALS — BP 128/83 | HR 85 | Wt 179.4 lb

## 2018-09-01 DIAGNOSIS — O09213 Supervision of pregnancy with history of pre-term labor, third trimester: Secondary | ICD-10-CM | POA: Diagnosis not present

## 2018-09-01 DIAGNOSIS — O0990 Supervision of high risk pregnancy, unspecified, unspecified trimester: Secondary | ICD-10-CM

## 2018-09-01 MED ORDER — HYDROXYPROGESTERONE CAPROATE 275 MG/1.1ML ~~LOC~~ SOAJ
275.0000 mg | Freq: Once | SUBCUTANEOUS | Status: AC
  Administered 2018-09-01: 275 mg via SUBCUTANEOUS

## 2018-09-01 NOTE — Telephone Encounter (Signed)
TC to pt regarding contractions, pressure pain and back pain Pt states contractions have not stopped. Pt notes +FM, no leaking of fluid , no blood. Pt states contractions were apart  Pt advised due to Hx of preterm labor to go to MAU. Pt voiced understanding.

## 2018-09-01 NOTE — Progress Notes (Signed)
Pt is here for 17P injection. Injection given in R arm, tolerated well.

## 2018-09-02 ENCOUNTER — Ambulatory Visit: Payer: Medicaid Other

## 2018-09-04 ENCOUNTER — Encounter (HOSPITAL_COMMUNITY): Payer: Self-pay

## 2018-09-04 ENCOUNTER — Inpatient Hospital Stay (HOSPITAL_COMMUNITY)
Admission: AD | Admit: 2018-09-04 | Discharge: 2018-09-05 | Disposition: A | Discharge status: Home / Self Care | Payer: Medicaid Other | Attending: Obstetrics & Gynecology | Admitting: Obstetrics & Gynecology

## 2018-09-04 DIAGNOSIS — Z7982 Long term (current) use of aspirin: Secondary | ICD-10-CM | POA: Insufficient documentation

## 2018-09-04 DIAGNOSIS — O09523 Supervision of elderly multigravida, third trimester: Secondary | ICD-10-CM | POA: Insufficient documentation

## 2018-09-04 DIAGNOSIS — O4700 False labor before 37 completed weeks of gestation, unspecified trimester: Secondary | ICD-10-CM

## 2018-09-04 DIAGNOSIS — O3483 Maternal care for other abnormalities of pelvic organs, third trimester: Secondary | ICD-10-CM | POA: Diagnosis not present

## 2018-09-04 DIAGNOSIS — N83209 Unspecified ovarian cyst, unspecified side: Secondary | ICD-10-CM | POA: Diagnosis not present

## 2018-09-04 DIAGNOSIS — O471 False labor at or after 37 completed weeks of gestation: Secondary | ICD-10-CM | POA: Diagnosis not present

## 2018-09-04 DIAGNOSIS — O212 Late vomiting of pregnancy: Secondary | ICD-10-CM | POA: Insufficient documentation

## 2018-09-04 DIAGNOSIS — K219 Gastro-esophageal reflux disease without esophagitis: Secondary | ICD-10-CM | POA: Insufficient documentation

## 2018-09-04 DIAGNOSIS — Z79899 Other long term (current) drug therapy: Secondary | ICD-10-CM | POA: Diagnosis not present

## 2018-09-04 DIAGNOSIS — O479 False labor, unspecified: Secondary | ICD-10-CM

## 2018-09-04 DIAGNOSIS — R1030 Lower abdominal pain, unspecified: Secondary | ICD-10-CM | POA: Diagnosis not present

## 2018-09-04 DIAGNOSIS — R109 Unspecified abdominal pain: Secondary | ICD-10-CM | POA: Diagnosis present

## 2018-09-04 DIAGNOSIS — Z3A33 33 weeks gestation of pregnancy: Secondary | ICD-10-CM | POA: Insufficient documentation

## 2018-09-04 DIAGNOSIS — O163 Unspecified maternal hypertension, third trimester: Secondary | ICD-10-CM | POA: Diagnosis not present

## 2018-09-04 DIAGNOSIS — O24410 Gestational diabetes mellitus in pregnancy, diet controlled: Secondary | ICD-10-CM | POA: Insufficient documentation

## 2018-09-04 DIAGNOSIS — R51 Headache: Secondary | ICD-10-CM | POA: Insufficient documentation

## 2018-09-04 DIAGNOSIS — O99613 Diseases of the digestive system complicating pregnancy, third trimester: Secondary | ICD-10-CM | POA: Insufficient documentation

## 2018-09-04 DIAGNOSIS — O26893 Other specified pregnancy related conditions, third trimester: Secondary | ICD-10-CM | POA: Insufficient documentation

## 2018-09-04 DIAGNOSIS — Z3A38 38 weeks gestation of pregnancy: Secondary | ICD-10-CM | POA: Diagnosis not present

## 2018-09-04 LAB — PROTEIN / CREATININE RATIO, URINE: Creatinine, Urine: 23 mg/dL

## 2018-09-04 LAB — COMPREHENSIVE METABOLIC PANEL
ALBUMIN: 3 g/dL — AB (ref 3.5–5.0)
ALT: 19 U/L (ref 0–44)
AST: 22 U/L (ref 15–41)
Alkaline Phosphatase: 73 U/L (ref 38–126)
Anion gap: 10 (ref 5–15)
BUN: 8 mg/dL (ref 6–20)
CHLORIDE: 105 mmol/L (ref 98–111)
CO2: 25 mmol/L (ref 22–32)
CREATININE: 0.6 mg/dL (ref 0.44–1.00)
Calcium: 9.8 mg/dL (ref 8.9–10.3)
GFR calc Af Amer: >60 mL/min (ref >60)
GFR calc non Af Amer: >60 mL/min (ref >60)
GLUCOSE: 85 mg/dL (ref 70–99)
Potassium: 4.1 mmol/L (ref 3.5–5.1)
SODIUM: 140 mmol/L (ref 135–145)
Total Bilirubin: 0.7 mg/dL (ref 0.3–1.2)
Total Protein: 6.6 g/dL (ref 6.5–8.1)

## 2018-09-04 LAB — URINALYSIS, ROUTINE W REFLEX MICROSCOPIC
Bilirubin Urine: NEGATIVE
Glucose, UA: NEGATIVE mg/dL
Hgb urine dipstick: NEGATIVE
KETONES UR: NEGATIVE mg/dL
LEUKOCYTES UA: NEGATIVE
Nitrite: NEGATIVE
PROTEIN: NEGATIVE mg/dL
Specific Gravity, Urine: 1.003 — ABNORMAL LOW (ref 1.005–1.030)
pH: 7 (ref 5.0–8.0)

## 2018-09-04 LAB — CBC
HCT: 32.5 % — ABNORMAL LOW (ref 36.0–46.0)
HEMOGLOBIN: 10.7 g/dL — AB (ref 12.0–15.0)
MCH: 29 pg (ref 26.0–34.0)
MCHC: 32.9 g/dL (ref 30.0–36.0)
MCV: 88.1 fL (ref 78.0–100.0)
PLATELETS: 231 10*3/uL (ref 150–400)
RBC: 3.69 MIL/uL — AB (ref 3.87–5.11)
RDW: 16.4 % — ABNORMAL HIGH (ref 11.5–15.5)
WBC: 9.4 10*3/uL (ref 4.0–10.5)

## 2018-09-04 MED ORDER — ACETAMINOPHEN 650 MG RE SUPP
650.0000 mg | Freq: Once | RECTAL | Status: AC
  Administered 2018-09-04: 650 mg via RECTAL
  Filled 2018-09-04: qty 1

## 2018-09-04 MED ORDER — LACTATED RINGERS IV BOLUS
1000.0000 mL | Freq: Once | INTRAVENOUS | Status: AC
  Administered 2018-09-04: 1000 mL via INTRAVENOUS

## 2018-09-04 MED ORDER — NIFEDIPINE 10 MG PO CAPS
10.0000 mg | ORAL_CAPSULE | ORAL | Status: DC | PRN
  Administered 2018-09-04 – 2018-09-05 (×3): 10 mg via ORAL
  Filled 2018-09-04 (×3): qty 1

## 2018-09-04 MED ORDER — SODIUM CHLORIDE 0.9 % IV SOLN
8.0000 mg | Freq: Once | INTRAVENOUS | Status: AC
  Administered 2018-09-04: 8 mg via INTRAVENOUS
  Filled 2018-09-04: qty 4

## 2018-09-04 NOTE — MAU Note (Signed)
Pt presents to MAU with c/o lower abdominal cramping that started today around 11 am and also has had leaking of fluid since then. Pt has had headache all day and does have history of pre-eclampsia and preterm labor. Pt denies VB. +FM

## 2018-09-04 NOTE — MAU Provider Note (Addendum)
History     CSN: [id number]  Arrival date and time: 09/04/18 2039   First Provider Initiated Contact with Patient 09/04/18 2115      Chief Complaint  Patient presents with  . Abdominal Pain  . Headache   [id number] @32 .0 wks here with pelvic pressure, LOF, ctx, HA, and N/V. Ctx and pressure have been ongoing for a few days but have worsened today, frequency varies. Reports underwear have felt wet since this am. Unsure of color. No gush of fluid. No bleeding. No recent IC. HA started today, located occipital. No visual changes. No epigastric pain. Reports 5 episodes of N/V today. She used Diclegis and Phenergan but did not help. Her pregnancy is complicated by AMA, A1GDM, CHTN, PTD @33  wks, hx of cocaine use in previous pregnancy.   OB History    Gravida  6   Para  5   Term  4   Preterm  1   AB      Living  5     SAB      TAB      Ectopic      Multiple  0   Live Births  5           Past Medical History:  Diagnosis Date  . GERD (gastroesophageal reflux disease)   . History of cocaine use   . Hypertension   . Kidney stones   . Ovarian cyst   . Pregnancy induced hypertension     Past Surgical History:  Procedure Laterality Date  . APPENDECTOMY    . HEMORRHOID SURGERY N/A 01/19/2016   Procedure: EXTENSIVE HEMORRHOIDECTOMY;  Surgeon: Franky Macho, MD;  Location: [facility];  Service: General;  Laterality: N/A;  . kidney stents    . LITHOTRIPSY      Family History  Problem Relation Age of Onset  . Cancer Mother        breast  . Diabetes Maternal Grandmother     Social History   Tobacco Use  . Smoking status: Never Smoker  . Smokeless tobacco: Never Used  Substance Use Topics  . Alcohol use: No  . Drug use: No    Comment: Hx Cocaine use    Allergies: No Known Allergies  Facility-Administered Medications Prior to Admission  Medication Dose Route Frequency Provider Last Rate Last Dose  . HYDROXYprogesterone Caproate SOAJ 275 mg  275 mg  Subcutaneous Weekly Denney, Rachelle A, CNM   275 mg at 08/25/18 1516   Medications Prior to Admission  Medication Sig Dispense Refill Last Dose  . acetaminophen (TYLENOL) 325 MG tablet Take 650 mg by mouth every 6 (six) hours as needed.   08/26/2018 at 1900  . aspirin EC 81 MG tablet Take 1 tablet (81 mg total) by mouth daily. (Patient not taking: Reported on 07/07/2018) 100 tablet 2 Not Taking  . Doxylamine-Pyridoxine (DICLEGIS) 10-10 MG TBEC Take 1 tablet with breakfast and lunch.  Take 2 tablets at bedtime. 100 tablet 4 Taking  . Elastic Bandages & Supports (COMFORT FIT MATERNITY SUPP LG) MISC 1 Units by Does not apply route daily. 1 each 0 Taking  . ferrous fumarate (HEMOCYTE - 106 MG FE) 325 (106 Fe) MG TABS tablet Take 1 tablet (106 mg of iron total) by mouth 2 (two) times daily. (Patient not taking: Reported on 08/04/2018) 90 each 0 Not Taking  . ondansetron (ZOFRAN ODT) 4 MG disintegrating tablet Take 1 tablet (4 mg total) by mouth every 6 (six) hours as needed for nausea. 20  tablet 0 Taking  . pantoprazole (PROTONIX) 40 MG tablet Take 1 tablet (40 mg total) by mouth daily. (Patient not taking: Reported on 08/04/2018) 30 tablet 6 Not Taking  . Prenatal Vit-Fe Fumarate-FA (PRENATAL MULTIVITAMIN) TABS tablet Take 1 tablet by mouth daily at 12 noon.   08/26/2018 at Unknown time  . promethazine (PHENERGAN) 25 MG suppository Place 1 suppository (25 mg total) rectally every 6 (six) hours as needed for nausea or vomiting. 12 each 0 08/26/2018 at 1300    Review of Systems  Constitutional: Negative for fever.  Eyes: Negative for visual disturbance.  Gastrointestinal: Positive for abdominal pain, nausea and vomiting.  Genitourinary: Positive for pelvic pain and vaginal discharge. Negative for vaginal bleeding.  Neurological: Positive for headaches.   Physical Exam   Blood pressure (!) 146/83, pulse (!) 121, temperature 99 F (37.2 C), temperature source Oral, resp. rate 20, height 5\' 3"  (1.6 m),  weight 80.7 kg, last menstrual period 01/12/2018.  Patient Vitals for the past 24 hrs:  BP Temp Temp src Pulse Resp Height Weight  09/04/18 2107 (!) 146/83 99 F (37.2 C) Oral (!) 121 20 - -  09/04/18 2053 - - - - - 5\' 3"  (1.6 m) 80.7 kg    Physical Exam  Constitutional: She is oriented to person, place, and time. She appears well-developed and well-nourished. No distress.  HENT:  Head: Normocephalic and atraumatic.  Neck: Normal range of motion.  Respiratory: Effort normal. No respiratory distress.  GI: Soft. She exhibits no distension. There is no tenderness.  gravid  Genitourinary:  Genitourinary Comments: SSE: no pool, fern neg SVE: 1/thick  Musculoskeletal: Normal range of motion.  Neurological: She is alert and oriented to person, place, and time.  Skin: Skin is warm and dry.  Psychiatric: She has a normal mood and affect.  EFM: 160 bpm, mod variability, + accels, no decels Toco: 3-8  Results for orders placed or performed during the hospital encounter of 09/04/18 (from the past 24 hour(s))  Urinalysis, Routine w reflex microscopic     Status: Abnormal   Collection Time: 09/04/18  9:08 PM  Result Value Ref Range   Color, Urine STRAW (A) YELLOW   APPearance CLEAR CLEAR   Specific Gravity, Urine 1.003 (L) 1.005 - 1.030   pH 7.0 5.0 - 8.0   Glucose, UA NEGATIVE NEGATIVE mg/dL   Hgb urine dipstick NEGATIVE NEGATIVE   Bilirubin Urine NEGATIVE NEGATIVE   Ketones, ur NEGATIVE NEGATIVE mg/dL   Protein, ur NEGATIVE NEGATIVE mg/dL   Nitrite NEGATIVE NEGATIVE   Leukocytes, UA NEGATIVE NEGATIVE   MAU Course  Procedures LR bolus Zofran Tylenol  MDM PEC labs ordered.  Transfer of care given to Buck Mam, CNM  09/04/2018 9:57 PM    Assessment and Plan   Assumed care from [facility] CNM. Pt received 1L of fluid, Tylenol and Zofran. States her H/A and N/V are better. Still feeling some cramping, but not as much pressure. Wanted to make sure that cramping  wasn't making her cx dilate. Also gave her Procardia 10mg  x 3 doses. FHR 140s, +accels, no decels. Ctx q 4 mins, 40-50sec. Cx unchanged from earlier exam today and also earlier this month (1/thick).   BPs 129/69, 132/70, all others very similar except for one abberant 120s/100. Pre-e labs nl  Discussed discharge home since no cx change over 1-2wks Rx Procardia 20mg  to use briefly prn ctx F/U at [facility] on 9/17 as scheduled or sooner increased s/s labor  SHAW,  KIMBERLY CNM 09/05/2018 1:16 AM

## 2018-09-05 DIAGNOSIS — Z3A38 38 weeks gestation of pregnancy: Secondary | ICD-10-CM

## 2018-09-05 DIAGNOSIS — O471 False labor at or after 37 completed weeks of gestation: Secondary | ICD-10-CM

## 2018-09-05 LAB — RAPID URINE DRUG SCREEN, HOSP PERFORMED
Amphetamines: NOT DETECTED
BARBITURATES: NOT DETECTED
BENZODIAZEPINES: NOT DETECTED
Cocaine: NOT DETECTED
Opiates: NOT DETECTED
Tetrahydrocannabinol: NOT DETECTED

## 2018-09-05 MED ORDER — NIFEDIPINE 20 MG PO CAPS: 20.0000 mg | ORAL_CAPSULE | ORAL | 1 refills | Status: DC | PRN

## 2018-09-05 MED ORDER — NIFEDIPINE 10 MG PO CAPS: 20.0000 mg | ORAL_CAPSULE | ORAL | Status: DC | PRN

## 2018-09-05 NOTE — Discharge Instructions (Signed)

## 2018-09-08 ENCOUNTER — Ambulatory Visit (INDEPENDENT_AMBULATORY_CARE_PROVIDER_SITE_OTHER): Payer: Medicaid Other | Admitting: Obstetrics and Gynecology

## 2018-09-08 ENCOUNTER — Encounter: Payer: Self-pay | Admitting: Obstetrics and Gynecology

## 2018-09-08 VITALS — BP 132/80 | HR 90 | Wt 180.2 lb

## 2018-09-08 DIAGNOSIS — O09892 Supervision of other high risk pregnancies, second trimester: Secondary | ICD-10-CM

## 2018-09-08 DIAGNOSIS — O24410 Gestational diabetes mellitus in pregnancy, diet controlled: Secondary | ICD-10-CM | POA: Diagnosis not present

## 2018-09-08 DIAGNOSIS — Z3009 Encounter for other general counseling and advice on contraception: Secondary | ICD-10-CM

## 2018-09-08 DIAGNOSIS — O09523 Supervision of elderly multigravida, third trimester: Secondary | ICD-10-CM

## 2018-09-08 DIAGNOSIS — O0990 Supervision of high risk pregnancy, unspecified, unspecified trimester: Secondary | ICD-10-CM

## 2018-09-08 DIAGNOSIS — O09212 Supervision of pregnancy with history of pre-term labor, second trimester: Secondary | ICD-10-CM

## 2018-09-08 LAB — POCT CBG (FASTING - GLUCOSE)-MANUAL ENTRY: GLUCOSE FASTING, POC: 87 mg/dL (ref 70–99)

## 2018-09-08 NOTE — Patient Instructions (Signed)

## 2018-09-08 NOTE — Progress Notes (Signed)
Subjective:  [name] is a 37 y.o. [id number] at 5070w4d being seen today for ongoing prenatal care.  She is currently monitored for the following issues for this high-risk pregnancy and has HTN (hypertension); Right ovarian cyst; Anemia; Gestational diabetes mellitus, class A1; Cocaine use; TIA (transient ischemic attack); Acute left-sided weakness; Prediabetes; HLD (hyperlipidemia); Supervision of high risk pregnancy, antepartum; Advanced maternal age in multigravida; History of preterm delivery, currently pregnant in second trimester; Kidney stones; History of cocaine use; Preterm contractions; and Unwanted fertility on their problem list.  Patient reports occasional contractions.  Contractions: Irregular. Vag. Bleeding: None.  Movement: Present. Denies leaking of fluid.   The following portions of the patient's history were reviewed and updated as appropriate: allergies, current medications, past family history, past medical history, past social history, past surgical history and problem list. Problem list updated.  Objective:   Vitals:   09/08/18 0846  BP: 132/80  Pulse: 90  Weight: 180 lb 3.2 oz (81.7 kg)    Fetal Status: Fetal Heart Rate (bpm): 145   Movement: Present     General:  Alert, oriented and cooperative. Patient is in no acute distress.  Skin: Skin is warm and dry. No rash noted.   Cardiovascular: Normal heart rate noted  Respiratory: Normal respiratory effort, no problems with respiration noted  Abdomen: Soft, gravid, appropriate for gestational age. Pain/Pressure: Present     Pelvic:  Cervical exam deferred        Extremities: Normal range of motion.  Edema: Trace  Mental Status: Normal mood and affect. Normal behavior. Normal judgment and thought content.   Urinalysis:      Assessment and Plan:  Pregnancy: [id number] at 6470w4d  1. Supervision of high risk pregnancy, antepartum Stable  2. Gestational diabetes mellitus, class A1 Has not seen Diabetic  educator Importance of seeing for glycemic control and improved pregnancy outcomes. Fasting CBG in office today 87 - POCT CBG (Fasting - Glucose)  3. History of preterm delivery, currently pregnant in second trimester Stable on 17 OHP and PRN procardia  4. Unwanted fertility BTL papers signed  5. Multigravida of advanced maternal age in third trimester Stable  Preterm labor symptoms and general obstetric precautions including but not limited to vaginal bleeding, contractions, leaking of fluid and fetal movement were reviewed in detail with the patient. Please refer to After Visit Summary for other counseling recommendations.  No follow-ups on file.   Hermina StaggersErvin, Rosita Guzzetta L, MD

## 2018-09-08 NOTE — Progress Notes (Signed)
Patient reports fetal movement with irregular contractions. Administered 17-p and pt tolerated well.

## 2018-09-10 ENCOUNTER — Other Ambulatory Visit: Payer: Medicaid Other

## 2018-09-10 ENCOUNTER — Encounter (HOSPITAL_COMMUNITY): Payer: Self-pay | Admitting: *Deleted

## 2018-09-10 ENCOUNTER — Other Ambulatory Visit: Payer: Self-pay

## 2018-09-10 ENCOUNTER — Observation Stay (HOSPITAL_COMMUNITY)
Admission: AD | Admit: 2018-09-10 | Discharge: 2018-09-12 | Disposition: A | Discharge status: Home / Self Care | Payer: Medicaid Other | Source: Ambulatory Visit | Attending: Obstetrics & Gynecology | Admitting: Obstetrics & Gynecology

## 2018-09-10 DIAGNOSIS — Z87898 Personal history of other specified conditions: Secondary | ICD-10-CM

## 2018-09-10 DIAGNOSIS — O4700 False labor before 37 completed weeks of gestation, unspecified trimester: Secondary | ICD-10-CM | POA: Diagnosis present

## 2018-09-10 DIAGNOSIS — Z3A33 33 weeks gestation of pregnancy: Secondary | ICD-10-CM | POA: Diagnosis not present

## 2018-09-10 DIAGNOSIS — F1491 Cocaine use, unspecified, in remission: Secondary | ICD-10-CM

## 2018-09-10 HISTORY — DX: Type 2 diabetes mellitus without complications: E11.9

## 2018-09-10 LAB — URINALYSIS, ROUTINE W REFLEX MICROSCOPIC
BILIRUBIN URINE: NEGATIVE
Glucose, UA: NEGATIVE mg/dL
HGB URINE DIPSTICK: NEGATIVE
Ketones, ur: NEGATIVE mg/dL
NITRITE: NEGATIVE
PROTEIN: NEGATIVE mg/dL
Specific Gravity, Urine: 1.004 — ABNORMAL LOW (ref 1.005–1.030)
pH: 8 (ref 5.0–8.0)

## 2018-09-10 LAB — FETAL FIBRONECTIN: FETAL FIBRONECTIN: POSITIVE — AB

## 2018-09-10 LAB — RAPID URINE DRUG SCREEN, HOSP PERFORMED
Amphetamines: NOT DETECTED
BARBITURATES: NOT DETECTED
BENZODIAZEPINES: NOT DETECTED
Cocaine: NOT DETECTED
Opiates: NOT DETECTED
Tetrahydrocannabinol: NOT DETECTED

## 2018-09-10 LAB — TYPE AND SCREEN
ABO/RH(D): O POS
Antibody Screen: NEGATIVE

## 2018-09-10 MED ORDER — ACETAMINOPHEN 325 MG PO TABS
650.0000 mg | ORAL_TABLET | ORAL | Status: DC | PRN
  Administered 2018-09-12: 650 mg via ORAL
  Filled 2018-09-10: qty 2

## 2018-09-10 MED ORDER — MAGNESIUM SULFATE BOLUS VIA INFUSION
4.0000 g | Freq: Once | INTRAVENOUS | Status: AC
  Administered 2018-09-10: 4 g via INTRAVENOUS
  Filled 2018-09-10: qty 500

## 2018-09-10 MED ORDER — PRENATAL MULTIVITAMIN CH
1.0000 | ORAL_TABLET | Freq: Every day | ORAL | Status: DC
  Administered 2018-09-11 – 2018-09-12 (×2): 1 via ORAL
  Filled 2018-09-10 (×2): qty 1

## 2018-09-10 MED ORDER — ZOLPIDEM TARTRATE 5 MG PO TABS
5.0000 mg | ORAL_TABLET | Freq: Every evening | ORAL | Status: DC | PRN
  Administered 2018-09-11: 5 mg via ORAL
  Filled 2018-09-10: qty 1

## 2018-09-10 MED ORDER — MAGNESIUM SULFATE 40 G IN LACTATED RINGERS - SIMPLE
2.0000 g/h | INTRAVENOUS | Status: AC
  Administered 2018-09-11: 2 g/h via INTRAVENOUS
  Filled 2018-09-10 (×2): qty 500

## 2018-09-10 MED ORDER — NIFEDIPINE 10 MG PO CAPS: 10.0000 mg | ORAL_CAPSULE | ORAL | Status: DC | PRN

## 2018-09-10 MED ORDER — NIFEDIPINE 10 MG PO CAPS
10.0000 mg | ORAL_CAPSULE | ORAL | Status: DC | PRN
  Administered 2018-09-10 (×3): 10 mg via ORAL
  Filled 2018-09-10 (×3): qty 1

## 2018-09-10 MED ORDER — CALCIUM CARBONATE ANTACID 500 MG PO CHEW
2.0000 | CHEWABLE_TABLET | ORAL | Status: DC | PRN
  Administered 2018-09-10: 400 mg via ORAL
  Filled 2018-09-10: qty 2

## 2018-09-10 MED ORDER — ONDANSETRON 4 MG PO TBDP
4.0000 mg | ORAL_TABLET | Freq: Once | ORAL | Status: AC
  Administered 2018-09-10: 4 mg via ORAL
  Filled 2018-09-10: qty 1

## 2018-09-10 MED ORDER — BETAMETHASONE SOD PHOS & ACET 6 (3-3) MG/ML IJ SUSP
12.0000 mg | Freq: Once | INTRAMUSCULAR | Status: AC
  Administered 2018-09-10: 12 mg via INTRAMUSCULAR
  Filled 2018-09-10: qty 2

## 2018-09-10 MED ORDER — LACTATED RINGERS IV SOLN
INTRAVENOUS | Status: DC
  Administered 2018-09-10 – 2018-09-11 (×3): via INTRAVENOUS

## 2018-09-10 MED ORDER — BETAMETHASONE SOD PHOS & ACET 6 (3-3) MG/ML IJ SUSP
12.0000 mg | Freq: Once | INTRAMUSCULAR | Status: AC
  Administered 2018-09-11: 12 mg via INTRAMUSCULAR
  Filled 2018-09-10: qty 2

## 2018-09-10 MED ORDER — DOCUSATE SODIUM 100 MG PO CAPS
100.0000 mg | ORAL_CAPSULE | Freq: Every day | ORAL | Status: DC
  Administered 2018-09-12: 100 mg via ORAL
  Filled 2018-09-10 (×2): qty 1

## 2018-09-10 NOTE — MAU Note (Signed)
Pt presents to [facility] with contractions on and off all day. In past 3 hrs contractions q5 minutes

## 2018-09-10 NOTE — MAU Provider Note (Addendum)
Chief Complaint:  Contractions   First Provider Initiated Contact with Patient 09/10/18 2025     HPI: [name] is a 37 y.o. [id number] at 38w6dwho presents to maternity admissions reporting painful uterine contractions, most of day.. No leaking or bleeding  Last exam a week ago was 1/thick..  . She reports good fetal movement, denies LOF, vaginal bleeding, vaginal itching/burning, urinary symptoms, h/a, dizziness, n/v, diarrhea, constipation or fever/chills.   Has history of one preterm delivery in 2017, all others were full term  Denies recent cocaine use. States last use was in 2017.  States it happened after loss of her mother and other stressors including a bad relationship  Abdominal Pain  This is a new problem. The current episode started today. The onset quality is gradual. The problem occurs intermittently. The problem has been unchanged. The quality of the pain is cramping. The abdominal pain does not radiate. Pertinent negatives include no constipation, diarrhea or dysuria. Nothing aggravates the pain. The pain is relieved by nothing.   RN Note: Pt presents to [facility] with contractions on and off all day. In past 3 hrs contractions q5 minutes  Past Meical History: Past Medical History:  Diagnosis Date  . Diabetes mellitus without complication (HCC)   . GERD (gastroesophageal reflux disease)   . History of cocaine use   . Hypertension   . Kidney stones   . Ovarian cyst   . Pregnancy induced hypertension     Past obstetric history: OB History  Gravida Para Term Preterm AB Living  6 5 4 1   5   SAB TAB Ectopic Multiple Live Births        0 5    # Outcome Date GA Lbr Len/2nd Weight Sex Delivery Anes PTL Lv  6 Current           5 Preterm 01/13/16 [redacted]w[redacted]d 05:59 / 00:04 2310 g M Vag-Spont None  LIV  4 Term 01/10/14 [redacted]w[redacted]d 09:00 / 04:13 3496 g M Vag-Spont EPI  LIV     Birth Comments: Mom 19 y/o G4P4. Labs neg. Gestational HTN on [place]. Baby 37 w, NSVD, L  hydronephrosis which was normal at 36 w U/S. Had phototherapy due to 10 bili at 24 hrs. Remained at 10. Requested repeat today.  Mom O+/ baby O+.   3 Term 01/10/06 [redacted]w[redacted]d  3402 g F Vag-Spont EPI  LIV  2 Term 09/02/01 [redacted]w[redacted]d  4139 g M Vag-Spont EPI  LIV  1 Term 01/24/97 [redacted]w[redacted]d  2835 g F Vag-Spont EPI  LIV    Past Surgical History: Past Surgical History:  Procedure Laterality Date  . APPENDECTOMY    . HEMORRHOID SURGERY N/A 01/19/2016   Procedure: EXTENSIVE HEMORRHOIDECTOMY;  Surgeon: Franky Macho, MD;  Location: [facility];  Service: General;  Laterality: N/A;  . kidney stents    . LITHOTRIPSY      Family History: Family History  Problem Relation Age of Onset  . Cancer Mother        breast  . Diabetes Maternal Grandmother     Social History: Social History   Tobacco Use  . Smoking status: Never Smoker  . Smokeless tobacco: Never Used  Substance Use Topics  . Alcohol use: No  . Drug use: No    Comment: Hx Cocaine use    Allergies: No Known Allergies  Meds:  Facility-Administered Medications Prior to Admission  Medication Dose Route Frequency Provider Last Rate Last Dose  . HYDROXYprogesterone Caproate SOAJ 275 mg  275 mg  Subcutaneous Weekly Denney, Rachelle A, CNM   275 mg at 09/08/18 1610   Medications Prior to Admission  Medication Sig Dispense Refill Last Dose  . acetaminophen (TYLENOL) 325 MG tablet Take 650 mg by mouth every 6 (six) hours as needed.   Taking  . aspirin EC 81 MG tablet Take 1 tablet (81 mg total) by mouth daily. (Patient not taking: Reported on 07/07/2018) 100 tablet 2 Not Taking  . Doxylamine-Pyridoxine (DICLEGIS) 10-10 MG TBEC Take 1 tablet with breakfast and lunch.  Take 2 tablets at bedtime. 100 tablet 4 Taking  . Elastic Bandages & Supports (COMFORT FIT MATERNITY SUPP LG) MISC 1 Units by Does not apply route daily. 1 each 0 Taking  . ferrous fumarate (HEMOCYTE - 106 MG FE) 325 (106 Fe) MG TABS tablet Take 1 tablet (106 mg of iron total) by mouth 2  (two) times daily. (Patient not taking: Reported on 08/04/2018) 90 each 0 Not Taking  . NIFEdipine (PROCARDIA) 20 MG capsule Take 1 capsule (20 mg total) by mouth every 4 (four) hours as needed (preterm contractions). 30 capsule 1 Taking  . ondansetron (ZOFRAN ODT) 4 MG disintegrating tablet Take 1 tablet (4 mg total) by mouth every 6 (six) hours as needed for nausea. 20 tablet 0 Taking  . pantoprazole (PROTONIX) 40 MG tablet Take 1 tablet (40 mg total) by mouth daily. (Patient not taking: Reported on 08/04/2018) 30 tablet 6 Not Taking  . Prenatal Vit-Fe Fumarate-FA (PRENATAL MULTIVITAMIN) TABS tablet Take 1 tablet by mouth daily at 12 noon.   Taking  . promethazine (PHENERGAN) 25 MG suppository Place 1 suppository (25 mg total) rectally every 6 (six) hours as needed for nausea or vomiting. 12 each 0 Taking    I have reviewed patient's Past Medical Hx, Surgical Hx, Family Hx, Social Hx, medications and allergies.   ROS:  Review of Systems  Gastrointestinal: Positive for abdominal pain. Negative for constipation and diarrhea.  Genitourinary: Negative for dysuria.   Other systems negative  Physical Exam   Patient Vitals for the past 24 hrs:  BP Temp Temp src Pulse Resp Height Weight  09/10/18 2018 140/84 - - - - - -  09/10/18 2016 - 98.8 F (37.1 C) Oral (!) 102 18 - -  09/10/18 2010 - - - - - 5\' 3"  (1.6 m) 81.6 kg   Constitutional: Well-developed, well-nourished female in no acute distress.  Cardiovascular: normal rate and rhythm Respiratory: normal effort, clear to auscultation bilaterally GI: Abd soft, non-tender, gravid appropriate for gestational age.   No rebound or guarding. MS: Extremities nontender, no edema, normal ROM Neurologic: Alert and oriented x 4.  GU: Neg CVAT.  PELVIC EXAM: Dilation: 2 Effacement (%): 50 Station: -2 Presentation: Vertex Exam by:: Wynelle Bourgeois CNM  FHT:  Baseline 140 , moderate variability, accelerations present, no decelerations Contractions:  q 4-6 mins Irregular     Labs: --/--/O POS (08/13 1616) Results for orders placed or performed during the hospital encounter of 09/10/18 (from the past 24 hour(s))  Urinalysis, Routine w reflex microscopic     Status: Abnormal   Collection Time: 09/10/18  8:32 PM  Result Value Ref Range   Color, Urine STRAW (A) YELLOW   APPearance CLEAR CLEAR   Specific Gravity, Urine 1.004 (L) 1.005 - 1.030   pH 8.0 5.0 - 8.0   Glucose, UA NEGATIVE NEGATIVE mg/dL   Hgb urine dipstick NEGATIVE NEGATIVE   Bilirubin Urine NEGATIVE NEGATIVE   Ketones, ur NEGATIVE NEGATIVE mg/dL  Protein, ur NEGATIVE NEGATIVE mg/dL   Nitrite NEGATIVE NEGATIVE   Leukocytes, UA TRACE (A) NEGATIVE   RBC / HPF 0-5 0 - 5 RBC/hpf   WBC, UA 0-5 0 - 5 WBC/hpf   Bacteria, UA MANY (A) NONE SEEN   Squamous Epithelial / LPF 0-5 0 - 5  Fetal fibronectin     Status: Abnormal   Collection Time: 09/10/18  8:32 PM  Result Value Ref Range   Fetal Fibronectin POSITIVE (A) NEGATIVE  Urine rapid drug screen (hosp performed)not at [facility]     Status: None   Collection Time: 09/10/18  8:32 PM  Result Value Ref Range   Opiates NONE DETECTED NONE DETECTED   Cocaine NONE DETECTED NONE DETECTED   Benzodiazepines NONE DETECTED NONE DETECTED   Amphetamines NONE DETECTED NONE DETECTED   Tetrahydrocannabinol NONE DETECTED NONE DETECTED   Barbiturates NONE DETECTED NONE DETECTED  Type and screen [facility] [place]     Status: None   Collection Time: 09/10/18 10:41 PM  Result Value Ref Range   ABO/RH(D) O POS    Antibody Screen NEG    Sample Expiration      09/13/2018 Performed at [facility], [address] [place], [place] [address]     Imaging:    MAU Course/MDM: I have ordered labs and reviewed results. UDS neg. Urine neg.  Fetal fibronectin done and is positive NST reviewed and is reactive, category I Consult Dr Erin FullingHarraway-Smith with presentation, exam findings and test results.  Treatments in MAU included  Procardia x 3 doses with little diminishment of contractions Zofran given for nausea.   IV hydration given..   Betamethasone series started (first dose) Since there  Is change in cervix and positive FFn, will start Magnesium tocolysis for contractions and neuroprotection Second betamethasone tomorrow.  Assessment: Single intrauterine pregnancy at 4861w0d Preterm labor History of one preterm birth, has been on  17P Hx cocaine abuse, drug screen neg x 2  Plan: Admit for Magnesium sulfate and betamethasone and monitoring MgSO4 bolus and 2g/hr Betamethsone second dose tomorrow Collect GBS CBG q 4 hrs  Wynelle BourgeoisMarie Jaidon Ellery CNM, MSN Certified Nurse-Midwife 09/10/2018 8:36 PM

## 2018-09-11 LAB — GLUCOSE, CAPILLARY
GLUCOSE-CAPILLARY: 128 mg/dL — AB (ref 70–99)
GLUCOSE-CAPILLARY: 158 mg/dL — AB (ref 70–99)
GLUCOSE-CAPILLARY: 165 mg/dL — AB (ref 70–99)
Glucose-Capillary: 131 mg/dL — ABNORMAL HIGH (ref 70–99)
Glucose-Capillary: 102 mg/dL — ABNORMAL HIGH (ref 70–99)

## 2018-09-11 LAB — MAGNESIUM: Magnesium: 4.9 mg/dL — ABNORMAL HIGH (ref 1.7–2.4)

## 2018-09-11 MED ORDER — FAMOTIDINE IN NACL 20-0.9 MG/50ML-% IV SOLN
20.0000 mg | Freq: Two times a day (BID) | INTRAVENOUS | Status: DC
  Administered 2018-09-11: 20 mg via INTRAVENOUS
  Filled 2018-09-11: qty 50

## 2018-09-11 MED ORDER — OXYCODONE-ACETAMINOPHEN 5-325 MG PO TABS
1.0000 | ORAL_TABLET | Freq: Four times a day (QID) | ORAL | Status: DC | PRN
  Administered 2018-09-11: 1 via ORAL
  Administered 2018-09-11: 2 via ORAL
  Filled 2018-09-11: qty 1
  Filled 2018-09-11: qty 2

## 2018-09-11 MED ORDER — ONDANSETRON HCL 4 MG/2ML IJ SOLN
4.0000 mg | Freq: Four times a day (QID) | INTRAMUSCULAR | Status: DC | PRN
  Administered 2018-09-11 (×3): 4 mg via INTRAVENOUS
  Filled 2018-09-11 (×3): qty 2

## 2018-09-11 MED ORDER — FAMOTIDINE 20 MG PO TABS
20.0000 mg | ORAL_TABLET | Freq: Two times a day (BID) | ORAL | Status: DC
  Administered 2018-09-11 – 2018-09-12 (×2): 20 mg via ORAL
  Filled 2018-09-11 (×2): qty 1

## 2018-09-11 MED ORDER — GI COCKTAIL ~~LOC~~
30.0000 mL | Freq: Once | ORAL | Status: AC
  Administered 2018-09-11: 30 mL via ORAL
  Filled 2018-09-11 (×2): qty 30

## 2018-09-11 MED ORDER — FENTANYL CITRATE (PF) 100 MCG/2ML IJ SOLN
50.0000 ug | INTRAMUSCULAR | Status: DC | PRN
  Administered 2018-09-11 (×3): 50 ug via INTRAVENOUS
  Filled 2018-09-11 (×3): qty 2

## 2018-09-11 MED ORDER — PROMETHAZINE HCL 25 MG/ML IJ SOLN
12.5000 mg | Freq: Four times a day (QID) | INTRAMUSCULAR | Status: DC | PRN
  Administered 2018-09-11 – 2018-09-12 (×2): 25 mg via INTRAVENOUS
  Administered 2018-09-12: 12.5 mg via INTRAVENOUS
  Filled 2018-09-11 (×3): qty 1

## 2018-09-11 NOTE — Progress Notes (Signed)
The patient is receiving famotidine by the intravenous route.  Based on criteria approved by the Pharmacy and Therapeutics Committee and the Medical Executive Committee, the medication is being converted to the equivalent oral dose form.  These criteria include: -No Active GI bleeding -Able to tolerate diet of full liquids (or better) or tube feeding -Able to tolerate other medications by the oral or enteral route  If you have any questions about this conversion, please contact the Pharmacy Department (ext [phone]).  Thank you.  Neila GearWendy Dysen Edmondson, RPh

## 2018-09-11 NOTE — Progress Notes (Signed)
Patient ID: [name], female   DOB: 05/18/1981, 37 y.o.   MRN: [id number] FACULTY PRACTICE ANTEPARTUM(COMPREHENSIVE) NOTE  [name] is a 37 y.o. [id number] at 1127w0d by early ultrasound who is admitted for Preterm labor.   Fetal presentation is cephalic. Length of Stay:  0  Days  Subjective: Still contracting Patient reports the fetal movement as active. Patient reports uterine contraction  activity as regular, every 4 minutes. Patient reports  vaginal bleeding as none. Patient describes fluid per vagina as None.  Vitals:  Blood pressure 137/84, pulse (!) 120, temperature 98.4 F (36.9 C), temperature source Oral, resp. rate 18, height 5\' 3"  (1.6 m), weight 81.6 kg, last menstrual period 01/12/2018, SpO2 100 %. Physical Examination:  General appearance - in mild to moderate distress Heart - normal rate and regular rhythm Abdomen - soft, nontender, nondistended Fundal Height:  size equals dates Cervical Exam: Evaluated by digital exam., Position: posterior, Dilation: 2-3cm, Thickness: 1 cm and Consistency: soft and found to be 1 cm/ 75%/-3 and fetal presentation is cephalic. Extremities: extremities normal, atraumatic, no cyanosis or edema and Homans sign is negative, no sign of DVT  Membranes:intact  Fetal Monitoring:  Baseline: 135 bpm, Variability: Good {> 6 bpm), Accelerations: Reactive and Decelerations: Absent  Labs:  Results for orders placed or performed during the hospital encounter of 09/10/18 (from the past 24 hour(s))  Urinalysis, Routine w reflex microscopic   Collection Time: 09/10/18  8:32 PM  Result Value Ref Range   Color, Urine STRAW (A) YELLOW   APPearance CLEAR CLEAR   Specific Gravity, Urine 1.004 (L) 1.005 - 1.030   pH 8.0 5.0 - 8.0   Glucose, UA NEGATIVE NEGATIVE mg/dL   Hgb urine dipstick NEGATIVE NEGATIVE   Bilirubin Urine NEGATIVE NEGATIVE   Ketones, ur NEGATIVE NEGATIVE mg/dL   Protein, ur NEGATIVE NEGATIVE mg/dL   Nitrite  NEGATIVE NEGATIVE   Leukocytes, UA TRACE (A) NEGATIVE   RBC / HPF 0-5 0 - 5 RBC/hpf   WBC, UA 0-5 0 - 5 WBC/hpf   Bacteria, UA MANY (A) NONE SEEN   Squamous Epithelial / LPF 0-5 0 - 5  Fetal fibronectin   Collection Time: 09/10/18  8:32 PM  Result Value Ref Range   Fetal Fibronectin POSITIVE (A) NEGATIVE  Urine rapid drug screen (hosp performed)not at [facility]   Collection Time: 09/10/18  8:32 PM  Result Value Ref Range   Opiates NONE DETECTED NONE DETECTED   Cocaine NONE DETECTED NONE DETECTED   Benzodiazepines NONE DETECTED NONE DETECTED   Amphetamines NONE DETECTED NONE DETECTED   Tetrahydrocannabinol NONE DETECTED NONE DETECTED   Barbiturates NONE DETECTED NONE DETECTED  Type and screen [facility] [place]   Collection Time: 09/10/18 10:41 PM  Result Value Ref Range   ABO/RH(D) O POS    Antibody Screen NEG    Sample Expiration      09/13/2018 Performed at [facility], [address] [place], [place] [address]   Glucose, capillary   Collection Time: 09/11/18  7:57 AM  Result Value Ref Range   Glucose-Capillary 131 (H) 70 - 99 mg/dL     Medications:  Scheduled . betamethasone acetate-betamethasone sodium phosphate  12 mg Intramuscular Once  . docusate sodium  100 mg Oral Daily  . prenatal multivitamin  1 tablet Oral Q1200   I have reviewed the patient's current medications.  ASSESSMENT: Patient Active Problem List   Diagnosis Date Noted  . Preterm labor 09/10/2018  . Unwanted fertility 08/11/2018  .  Preterm contractions 08/05/2018  . History of cocaine use 07/28/2018  . Kidney stones 07/07/2018  . History of preterm delivery, currently pregnant in second trimester 06/30/2018  . Supervision of high risk pregnancy, antepartum 04/07/2018  . Advanced maternal age in multigravida 04/07/2018  . Prediabetes   . HLD (hyperlipidemia)   . TIA (transient ischemic attack) 07/30/2016  . Acute left-sided weakness 07/30/2016  . Cocaine use 01/16/2016  .  Gestational diabetes mellitus, class A1 12/20/2015  . Anemia 09/11/2015  . Right ovarian cyst 08/30/2015  . HTN (hypertension) 01/09/2014    PLAN: Continue present management with Magnesium, betamethasone and observe for PTL. Pepcid for reflux sx  [name] 09/11/2018,9:30 AM

## 2018-09-12 ENCOUNTER — Inpatient Hospital Stay (HOSPITAL_COMMUNITY)
Admission: AD | Admit: 2018-09-12 | Discharge: 2018-09-13 | Disposition: A | Discharge status: Home / Self Care | Payer: Medicaid Other | Source: Ambulatory Visit | Attending: Obstetrics & Gynecology | Admitting: Obstetrics & Gynecology

## 2018-09-12 ENCOUNTER — Other Ambulatory Visit: Payer: Self-pay

## 2018-09-12 DIAGNOSIS — Z3A33 33 weeks gestation of pregnancy: Secondary | ICD-10-CM | POA: Diagnosis not present

## 2018-09-12 DIAGNOSIS — Z7982 Long term (current) use of aspirin: Secondary | ICD-10-CM | POA: Insufficient documentation

## 2018-09-12 DIAGNOSIS — F1491 Cocaine use, unspecified, in remission: Secondary | ICD-10-CM

## 2018-09-12 DIAGNOSIS — Z87898 Personal history of other specified conditions: Secondary | ICD-10-CM

## 2018-09-12 LAB — CBC
HEMATOCRIT: 28.5 % — AB (ref 36.0–46.0)
HEMOGLOBIN: 9.5 g/dL — AB (ref 12.0–15.0)
MCH: 29.9 pg (ref 26.0–34.0)
MCHC: 33.3 g/dL (ref 30.0–36.0)
MCV: 89.6 fL (ref 78.0–100.0)
Platelets: 192 10*3/uL (ref 150–400)
RBC: 3.18 MIL/uL — ABNORMAL LOW (ref 3.87–5.11)
RDW: 16.1 % — AB (ref 11.5–15.5)
WBC: 10.8 10*3/uL — ABNORMAL HIGH (ref 4.0–10.5)

## 2018-09-12 LAB — GLUCOSE, CAPILLARY
Glucose-Capillary: 162 mg/dL — ABNORMAL HIGH (ref 70–99)
Glucose-Capillary: 122 mg/dL — ABNORMAL HIGH (ref 70–99)

## 2018-09-12 LAB — TYPE AND SCREEN
ABO/RH(D): O POS
ANTIBODY SCREEN: NEGATIVE

## 2018-09-12 MED ORDER — LACTATED RINGERS IV SOLN
INTRAVENOUS | Status: DC
  Administered 2018-09-12: 23:00:00 via INTRAVENOUS

## 2018-09-12 MED ORDER — NIFEDIPINE ER 30 MG PO TB24: 30.0000 mg | ORAL_TABLET | Freq: Two times a day (BID) | ORAL | 1 refills | Status: DC

## 2018-09-12 MED ORDER — NIFEDIPINE ER OSMOTIC RELEASE 30 MG PO TB24
30.0000 mg | ORAL_TABLET | Freq: Two times a day (BID) | ORAL | Status: DC
  Administered 2018-09-12: 30 mg via ORAL
  Filled 2018-09-12: qty 1

## 2018-09-12 MED ORDER — NIFEDIPINE 10 MG PO CAPS
10.0000 mg | ORAL_CAPSULE | ORAL | Status: AC | PRN
  Administered 2018-09-12 – 2018-09-13 (×3): 10 mg via ORAL
  Filled 2018-09-12 (×3): qty 1

## 2018-09-12 NOTE — Progress Notes (Signed)
Called into patient's room because "I feel something in my vagina." Patient was sitting on toilet. She was assisted back to bed. When labia majora was spread, cervix was visualized almost at introitus. Dr. Macon LargeAnyanwu was called; MD stated she was not concerned as patient is a para 5, and asked me to do a gentle cervical exam to ensure that patient's cervix had not changed. My exam was unchanged from Dr. Mont DuttonAnyanwu's exam done about an hour ago.

## 2018-09-12 NOTE — Progress Notes (Signed)
Discharged home via w/c in stable condition. 

## 2018-09-12 NOTE — Discharge Summary (Signed)
Antenatal Physician Discharge Summary  Patient ID: [name] MRN: [id number] DOB/AGE: December 26, 1980 37 y.o.  Admit date: 09/10/2018 Discharge date: 09/12/2018  Admission Diagnoses: Preterm labor in the third trimester  Discharge Diagnoses: The same  Prenatal Procedures: NST  Consults: Neonatology, Maternal Fetal Medicine  Hospital Course:  This is a 37 y.o. N5A2130 with IUP at [redacted]w[redacted]d admitted for concern about PTL. She was admitted with contractions, noted to have a cervical exam of 2.5/70/-3/vertex.  No leaking of fluid and no bleeding.  She was initially started on magnesium sulfate for tocolysis and neuroprotection and also received betamethasone x 2 doses.  Her tocolysis was transitioned to Procardia. She was observed, fetal heart rate monitoring remained reassuring, and she had no signs/symptoms of progressing preterm labor or other maternal-fetal concerns.  Her cervical exam was unchanged from admission.  She was deemed stable for discharge to home with outpatient follow up.  Discharge Exam: Temp:  [98 F (36.7 C)-98.7 F (37.1 C)] 98.1 F (36.7 C) (09/21 0815) Pulse Rate:  [91-104] 93 (09/21 0815) Resp:  [16-18] 18 (09/21 0815) BP: (105-115)/(48-77) 115/77 (09/21 0815) SpO2:  [97 %-98 %] 98 % (09/21 0815) Physical Examination: CONSTITUTIONAL: Well-developed, well-nourished female in no acute distress.  HENT:  Normocephalic, atraumatic, External right and left ear normal. Oropharynx is clear and moist EYES: Conjunctivae and EOM are normal. Pupils are equal, round, and reactive to light. No scleral icterus.  NECK: Normal range of motion, supple, no masses SKIN: Skin is warm and dry. No rash noted. Not diaphoretic. No erythema. No pallor. NEUROLGIC: Alert and oriented to person, place, and time. Normal reflexes, muscle tone coordination. No cranial nerve deficit noted. PSYCHIATRIC: Normal mood and affect. Normal behavior. Normal judgment and thought  content. CARDIOVASCULAR: Normal heart rate noted, regular rhythm RESPIRATORY: Effort and breath sounds normal, no problems with respiration noted MUSCULOSKELETAL: Normal range of motion. No edema and no tenderness. 2+ distal pulses. ABDOMEN: Soft, nontender, nondistended, gravid. CERVIX: Dilation: 2.5 Effacement (%): 70 Cervical Position: Posterior Station: -3 Presentation: Vertex Exam by:: klm  Fetal monitoring: FHR: 150 bpm, Variability: moderate, Accelerations: Present, Decelerations: Absent  Uterine activity: 1-2 contractions per hour  Significant Diagnostic Studies:  Results for orders placed or performed during the hospital encounter of 09/10/18 (from the past 168 hour(s))  Urinalysis, Routine w reflex microscopic   Collection Time: 09/10/18  8:32 PM  Result Value Ref Range   Color, Urine STRAW (A) YELLOW   APPearance CLEAR CLEAR   Specific Gravity, Urine 1.004 (L) 1.005 - 1.030   pH 8.0 5.0 - 8.0   Glucose, UA NEGATIVE NEGATIVE mg/dL   Hgb urine dipstick NEGATIVE NEGATIVE   Bilirubin Urine NEGATIVE NEGATIVE   Ketones, ur NEGATIVE NEGATIVE mg/dL   Protein, ur NEGATIVE NEGATIVE mg/dL   Nitrite NEGATIVE NEGATIVE   Leukocytes, UA TRACE (A) NEGATIVE   RBC / HPF 0-5 0 - 5 RBC/hpf   WBC, UA 0-5 0 - 5 WBC/hpf   Bacteria, UA MANY (A) NONE SEEN   Squamous Epithelial / LPF 0-5 0 - 5  Fetal fibronectin   Collection Time: 09/10/18  8:32 PM  Result Value Ref Range   Fetal Fibronectin POSITIVE (A) NEGATIVE  Urine rapid drug screen (hosp performed)not at [facility]   Collection Time: 09/10/18  8:32 PM  Result Value Ref Range   Opiates NONE DETECTED NONE DETECTED   Cocaine NONE DETECTED NONE DETECTED   Benzodiazepines NONE DETECTED NONE DETECTED   Amphetamines NONE DETECTED NONE DETECTED   Tetrahydrocannabinol  NONE DETECTED NONE DETECTED   Barbiturates NONE DETECTED NONE DETECTED  Type and screen [facility]   Collection Time: 09/10/18 10:41 PM  Result Value Ref  Range   ABO/RH(D) O POS    Antibody Screen NEG    Sample Expiration      09/13/2018 Performed at [facility], [address], [place], [place] [address]   Culture, beta strep (group b only)   Collection Time: 09/10/18 10:44 PM  Result Value Ref Range   Specimen Description      VAGINAL/RECTAL Performed at [facility], [address], [place], [place] [address]    Special Requests      NONE Performed at [facility], [address] [place], [place] [address]    Culture      CULTURE REINCUBATED FOR BETTER GROWTH Performed at [facility], [address] [place], [place] [address]    Report Status PENDING   Glucose, capillary   Collection Time: 09/11/18  7:57 AM  Result Value Ref Range   Glucose-Capillary 131 (H) 70 - 99 mg/dL  Glucose, capillary   Collection Time: 09/11/18 11:35 AM  Result Value Ref Range   Glucose-Capillary 158 (H) 70 - 99 mg/dL  Magnesium   Collection Time: 09/11/18  2:40 PM  Result Value Ref Range   Magnesium 4.9 (H) 1.7 - 2.4 mg/dL  Glucose, capillary   Collection Time: 09/11/18  4:49 PM  Result Value Ref Range   Glucose-Capillary 102 (H) 70 - 99 mg/dL  Glucose, capillary   Collection Time: 09/11/18  8:08 PM  Result Value Ref Range   Glucose-Capillary 128 (H) 70 - 99 mg/dL   Comment 1 Notify RN   Glucose, capillary   Collection Time: 09/11/18 11:52 PM  Result Value Ref Range   Glucose-Capillary 165 (H) 70 - 99 mg/dL   Comment 1 Notify RN   Glucose, capillary   Collection Time: 09/12/18  4:22 AM  Result Value Ref Range   Glucose-Capillary 162 (H) 70 - 99 mg/dL  Glucose, capillary   Collection Time: 09/12/18  8:13 AM  Result Value Ref Range   Glucose-Capillary 122 (H) 70 - 99 mg/dL  Results for orders placed or performed in visit on 09/08/18 (from the past 168 hour(s))  POCT CBG (Fasting - Glucose)   Collection Time: 09/08/18  9:11 AM  Result Value Ref Range   Glucose Fasting, POC 87 70 - 99 mg/dL   [place] Mfm Ob  Transvaginal  Result Date: 08/26/2018 ----------------------------------------------------------------------  OBSTETRICS REPORT                       (Signed Final 08/26/2018 05:13 pm) ---------------------------------------------------------------------- Patient Info  ID #:       [id number]                          D.O.B.:  May 30, 1981 (37 yrs)  Name:       Albertha Ghee-              Visit Date: 08/26/2018 04:29 pm              TEJEDA ---------------------------------------------------------------------- Performed By  Performed By:     Eden Lathe BS      Ref. Address:     [address]                    [facility]  [address]                                                             [place], [place]                                                             [id number]  Attending:        Patsi Searsobert L Jacobson      Location:         [facility]                    MD  Referred By:      Adam PhenixJAMES G ARNOLD                    MD ---------------------------------------------------------------------- Orders   #  Description                          Code         Ordered By   1  US MFM OB FOLLOW UP                  [id number]     MICHAEL ERVIN   2  US MFM OB TRANSVAGINAL               [phone]      [facility]  ----------------------------------------------------------------------   #  Order #                    Accession #                 Episode #   1  [id number]                  [id number]                  [id number]   2  [id number]                  [id number]                  [id number]  ---------------------------------------------------------------------- Indications   Poor obstetric history: Previous preterm       O09.219   delivery, antepartum (33 weeks)   Poor obstetric history: Previous               O09.299   preeclampsia / eclampsia/gestational HTN   Advanced maternal age multigravida 1035+,        60O09.523   third trimester   [redacted] weeks gestation of pregnancy                 Z3A.30  ---------------------------------------------------------------------- Vital Signs                                                 Height:        5'3" ---------------------------------------------------------------------- Fetal Evaluation  Num Of Fetuses:  1  Fetal Heart Rate(bpm):  133  Cardiac Activity:       Observed  Presentation:           Cephalic  Placenta:               Posterior  P. Cord Insertion:      Visualized  Amniotic Fluid  AFI FV:      Subjectively upper-normal  AFI Sum(cm)     %Tile       Largest Pocket(cm)  23.23           93          8.15  RUQ(cm)       RLQ(cm)       LUQ(cm)        LLQ(cm)  4.94          6.58          3.56           8.15 ---------------------------------------------------------------------- Biometry  BPD:      78.4  mm     G. Age:  31w 3d         62  %    CI:        70.98   %    70 - 86                                                          FL/HC:      19.8   %    19.3 - 21.3  HC:      296.5  mm     G. Age:  32w 6d         72  %    HC/AC:      1.05        0.96 - 1.17  AC:      281.1  mm     G. Age:  32w 1d         84  %    FL/BPD:     75.0   %    71 - 87  FL:       58.8  mm     G. Age:  30w 5d         35  %    FL/AC:      20.9   %    20 - 24  HUM:      52.7  mm     G. Age:  30w 5d         53  %  Est. FW:    1823  gm           4 lb     74  % ---------------------------------------------------------------------- OB History  Gravidity:    6         Term:   4        Prem:   1        SAB:   0  TOP:          0       Ectopic:  0        Living: 5 ---------------------------------------------------------------------- Gestational Age  LMP:           32w 2d        Date:  01/12/18  EDD:   10/19/18  U/S Today:     31w 6d                                        EDD:   10/22/18  Best:          30w 5d     Det. By:  U/S C R L  (04/14/18)    EDD:   10/30/18 ---------------------------------------------------------------------- Anatomy  Cranium:                Appears normal         Aortic Arch:            Appears normal  Cavum:                 Previously seen        Ductal Arch:            Appears normal  Ventricles:            Appears normal         Diaphragm:              Appears normal  Choroid Plexus:        Previously seen        Stomach:                Appears normal, left                                                                        sided  Cerebellum:            Previously seen        Abdomen:                Appears normal  Posterior Fossa:       Previously seen        Abdominal Wall:         Appears nml (cord                                                                        insert, abd wall)  Nuchal Fold:           Previously seen        Cord Vessels:           Appears normal (3                                                                        vessel cord)  Face:                  Appears normal  Kidneys:                Appear normal                         (orbits and profile)  Lips:                  Appears normal         Bladder:                Appears normal  Thoracic:              Appears normal         Spine:                  Previously seen  Heart:                 Appears normal         Upper Extremities:      Previously seen                         (4CH, axis, and                         situs)  RVOT:                  Appears normal         Lower Extremities:      Previously seen  LVOT:                  Appears normal  Other:  Lt heel previously visualized. Nasal bone visualized. ---------------------------------------------------------------------- Cervix Uterus Adnexa  Cervix  Length:            3.3  cm.  Normal appearance by transvaginal scan  Uterus  No abnormality visualized.  Left Ovary  Within normal limits.  Right Ovary  Within normal limits.  Cul De Sac  No free fluid seen.  Adnexa  No abnormality visualized. ---------------------------------------------------------------------- Comments  U/S images reviewed. Findings  reviewed with patient.  Appropriate fetal growth is noted.  No fetal abnormalities are  seen.  Patient is c/o irregular contractions and specifically asked for  cervical evaluation.  She denies ROM or bleeding.  fFN done  prior to TV exam.  Cervix measures 3.75 cm.  Questions answered.  15 minutes spent face to face.  Recommendations: 1) Serial U/S every 4 weeks for fetal  growth 2) Weekly BPP beginning @ 36 weeks 3) Patient to  contact OB if greater than 3-4 contractions per hour, vaginal  bleeding, ROM or decreased fetal movement. ----------------------------------------------------------------------               Patsi Sears, MD Electronically Signed Final Report   08/26/2018 05:13 pm ----------------------------------------------------------------------  [place] Mfm Ob Follow Up  Result Date: 08/26/2018 ----------------------------------------------------------------------  OBSTETRICS REPORT                       (Signed Final 08/26/2018 05:13 pm) ---------------------------------------------------------------------- Patient Info  ID #:       [id number]                          D.O.B.:  1981/06/01 (37 yrs)  Name:       Albertha Ghee-  Visit Date: 08/26/2018 04:29 pm              TEJEDA ---------------------------------------------------------------------- Performed By  Performed By:     Eden Lathe BS      Ref. Address:     [address]                    [facility]                                                             [address]                                                             [place], [place]                                                             [address]  Attending:        Patsi Sears      Location:         [facility]                    MD  Referred By:      Adam Phenix                    MD ---------------------------------------------------------------------- Orders   #  Description                          Code         Ordered By   1  [place] MFM OB FOLLOW UP                   [phone]     MICHAEL ERVIN   2  [place] MFM OB TRANSVAGINAL               [id number]      Nettie Elm  ----------------------------------------------------------------------   #  Order #                    Accession #                 Episode #   1  [id number]                  [id number]                  [id number]   2  [id number]                  [id number]                  [id number]  ---------------------------------------------------------------------- Indications   Poor obstetric history: Previous preterm       O09.219   delivery, antepartum (33 weeks)   Poor obstetric history: Previous  O09.299   preeclampsia / eclampsia/gestational HTN   Advanced maternal age multigravida 34+,        O74.523   third trimester   [redacted] weeks gestation of pregnancy                Z3A.30  ---------------------------------------------------------------------- Vital Signs                                                 Height:        5'3" ---------------------------------------------------------------------- Fetal Evaluation  Num Of Fetuses:         1  Fetal Heart Rate(bpm):  133  Cardiac Activity:       Observed  Presentation:           Cephalic  Placenta:               Posterior  P. Cord Insertion:      Visualized  Amniotic Fluid  AFI FV:      Subjectively upper-normal  AFI Sum(cm)     %Tile       Largest Pocket(cm)  23.23           93          8.15  RUQ(cm)       RLQ(cm)       LUQ(cm)        LLQ(cm)  4.94          6.58          3.56           8.15 ---------------------------------------------------------------------- Biometry  BPD:      78.4  mm     G. Age:  31w 3d         62  %    CI:        70.98   %    70 - 86                                                          FL/HC:      19.8   %    19.3 - 21.3  HC:      296.5  mm     G. Age:  32w 6d         72  %    HC/AC:      1.05        0.96 - 1.17  AC:      281.1  mm     G. Age:  32w 1d         84  %    FL/BPD:     75.0   %    71 - 87  FL:       58.8  mm     G. Age:   30w 5d         35  %    FL/AC:      20.9   %    20 - 24  HUM:      52.7  mm     G. Age:  30w 5d         53  %  Est. FW:  1823  gm           4 lb     74  % ---------------------------------------------------------------------- OB History  Gravidity:    6         Term:   4        Prem:   1        SAB:   0  TOP:          0       Ectopic:  0        Living: 5 ---------------------------------------------------------------------- Gestational Age  LMP:           32w 2d        Date:  01/12/18                 EDD:   10/19/18  U/S Today:     31w 6d                                        EDD:   10/22/18  Best:          30w 5d     Det. By:  U/S C R L  (04/14/18)    EDD:   10/30/18 ---------------------------------------------------------------------- Anatomy  Cranium:               Appears normal         Aortic Arch:            Appears normal  Cavum:                 Previously seen        Ductal Arch:            Appears normal  Ventricles:            Appears normal         Diaphragm:              Appears normal  Choroid Plexus:        Previously seen        Stomach:                Appears normal, left                                                                        sided  Cerebellum:            Previously seen        Abdomen:                Appears normal  Posterior Fossa:       Previously seen        Abdominal Wall:         Appears nml (cord  insert, abd wall)  Nuchal Fold:           Previously seen        Cord Vessels:           Appears normal (3                                                                        vessel cord)  Face:                  Appears normal         Kidneys:                Appear normal                         (orbits and profile)  Lips:                  Appears normal         Bladder:                Appears normal  Thoracic:              Appears normal         Spine:                  Previously seen  Heart:                  Appears normal         Upper Extremities:      Previously seen                         (4CH, axis, and                         situs)  RVOT:                  Appears normal         Lower Extremities:      Previously seen  LVOT:                  Appears normal  Other:  Lt heel previously visualized. Nasal bone visualized. ---------------------------------------------------------------------- Cervix Uterus Adnexa  Cervix  Length:            3.3  cm.  Normal appearance by transvaginal scan  Uterus  No abnormality visualized.  Left Ovary  Within normal limits.  Right Ovary  Within normal limits.  Cul De Sac  No free fluid seen.  Adnexa  No abnormality visualized. ---------------------------------------------------------------------- Comments  U/S images reviewed. Findings reviewed with patient.  Appropriate fetal growth is noted.  No fetal abnormalities are  seen.  Patient is c/o irregular contractions and specifically asked for  cervical evaluation.  She denies ROM or bleeding.  fFN done  prior to TV exam.  Cervix measures 3.75 cm.  Questions answered.  15 minutes spent face to face.  Recommendations: 1) Serial U/S every 4 weeks for fetal  growth 2) Weekly BPP beginning @ 36 weeks 3) Patient to  contact OB if greater than 3-4 contractions per hour, vaginal  bleeding, ROM or decreased fetal movement. ----------------------------------------------------------------------               Patsi Sears, MD Electronically Signed Final Report   08/26/2018 05:13 pm ----------------------------------------------------------------------   Future Appointments  Date Time Provider Department Center  09/15/2018  8:30 AM [facility] NURSE [facility] None  09/22/2018  9:15 AM Conan Bowens, MD [facility] None    Discharge Condition: Stable  Discharge disposition: 01-Home or Self Care       Discharge Instructions    Discharge activity:  No Restrictions   Complete by:  As directed    Discharge diet:  No restrictions    Complete by:  As directed    Fetal Kick Count:  Lie on our left side for one hour after a meal, and count the number of times your baby kicks.  If it is less than 5 times, get up, move around and drink some juice.  Repeat the test 30 minutes later.  If it is still less than 5 kicks in an hour, notify your doctor.   Complete by:  As directed    No sexual activity restrictions   Complete by:  As directed    Notify physician for a general feeling that "something is not right"   Complete by:  As directed    Notify physician for increase or change in vaginal discharge   Complete by:  As directed    Notify physician for leaking of fluid   Complete by:  As directed    Notify physician for pelvic pressure   Complete by:  As directed    PRETERM LABOR:  Includes any of the follwing symptoms that occur between 20 - [redacted] weeks gestation.  If these symptoms are not stopped, preterm labor can result in preterm delivery, placing your baby at risk   Complete by:  As directed      Allergies as of 09/12/2018   No Known Allergies     Medication List    STOP taking these medications   NIFEdipine 20 MG capsule Commonly known as:  PROCARDIA Replaced by:  NIFEdipine 30 MG 24 hr tablet     TAKE these medications   acetaminophen 325 MG tablet Commonly known as:  TYLENOL Take 650 mg by mouth every 6 (six) hours as needed.   aspirin EC 81 MG tablet Take 1 tablet (81 mg total) by mouth daily.   COMFORT FIT MATERNITY SUPP LG Misc 1 Units by Does not apply route daily.   Doxylamine-Pyridoxine 10-10 MG Tbec Take 1 tablet with breakfast and lunch.  Take 2 tablets at bedtime.   ferrous fumarate 325 (106 Fe) MG Tabs tablet Commonly known as:  HEMOCYTE - 106 mg FE Take 1 tablet (106 mg of iron total) by mouth 2 (two) times daily.   NIFEdipine 30 MG 24 hr tablet Commonly known as:  PROCARDIA-XL/ADALAT CC Take 1 tablet (30 mg total) by mouth 2 (two) times daily. Replaces:  NIFEdipine 20 MG capsule    ondansetron 4 MG disintegrating tablet Commonly known as:  ZOFRAN-ODT Take 1 tablet (4 mg total) by mouth every 6 (six) hours as needed for nausea.   pantoprazole 40 MG tablet Commonly known as:  PROTONIX Take 1 tablet (40 mg total) by mouth daily.   prenatal multivitamin Tabs tablet Take 1 tablet by mouth daily at 12 noon.   promethazine 25 MG suppository Commonly known as:  PHENERGAN Place 1 suppository (25 mg total) rectally every 6 (six) hours as needed  for nausea or vomiting.        Signed: Jaynie Collins M.D. 09/12/2018, 3:25 PM

## 2018-09-12 NOTE — Progress Notes (Signed)
Patient ID: [name], female   DOB: 07/15/1981, 37 y.o.   MRN: [id number] [facility] ANTEPARTUM COMPREHENSIVE PROGRESS NOTE  [name] is a 37 y.o. [id number] at 7624w1d  who is admitted for Preterm labor.   Fetal presentation is cephalic. Length of Stay:  0  Days  Subjective: Pt reports feeling much better today. Reports only an occ ut ctx maybe 1 an hr. Not painful.  Patient reports good fetal movement. Denies vaginal bleeding of LOF  Vitals:  Blood pressure 105/62, pulse 94, temperature 98.1 F (36.7 C), temperature source Oral, resp. rate 16, height 5\' 3"  (1.6 m), weight 81.6 kg, last menstrual period 01/12/2018, SpO2 97 %.   Physical Examination: Lungs clear Heart RRR Abd soft + BS gravid non tender  Fetal Monitoring:  130-140's, + accels  Labs:  Results for orders placed or performed during the hospital encounter of 09/10/18 (from the past 24 hour(s))  Glucose, capillary   Collection Time: 09/11/18  7:57 AM  Result Value Ref Range   Glucose-Capillary 131 (H) 70 - 99 mg/dL  Glucose, capillary   Collection Time: 09/11/18 11:35 AM  Result Value Ref Range   Glucose-Capillary 158 (H) 70 - 99 mg/dL  Magnesium   Collection Time: 09/11/18  2:40 PM  Result Value Ref Range   Magnesium 4.9 (H) 1.7 - 2.4 mg/dL  Glucose, capillary   Collection Time: 09/11/18  4:49 PM  Result Value Ref Range   Glucose-Capillary 102 (H) 70 - 99 mg/dL  Glucose, capillary   Collection Time: 09/11/18  8:08 PM  Result Value Ref Range   Glucose-Capillary 128 (H) 70 - 99 mg/dL   Comment 1 Notify RN   Glucose, capillary   Collection Time: 09/11/18 11:52 PM  Result Value Ref Range   Glucose-Capillary 165 (H) 70 - 99 mg/dL   Comment 1 Notify RN   Glucose, capillary   Collection Time: 09/12/18  4:22 AM  Result Value Ref Range   Glucose-Capillary 162 (H) 70 - 99 mg/dL    Imaging Studies:    None   Medications:  Scheduled . docusate sodium  100 mg Oral Daily  .  famotidine  20 mg Oral BID  . NIFEdipine  30 mg Oral BID  . prenatal multivitamin  1 tablet Oral Q1200   I have reviewed the patient's current medications.  ASSESSMENT: IUP 33 1/7 weeks PTL GDM, A1  PLAN: Off magnesium. S/P BMZ x 2. Will start Procardia. Continue to monitor. If continues to do well, plan discharge home later today or tomorrow.  Continue routine antenatal care.   Melissa StaggersMichael L Kecia Johnston 09/12/2018,7:34 AM

## 2018-09-13 ENCOUNTER — Encounter (HOSPITAL_COMMUNITY): Payer: Self-pay | Admitting: *Deleted

## 2018-09-13 LAB — CULTURE, BETA STREP (GROUP B ONLY)

## 2018-09-13 MED ORDER — TERBUTALINE SULFATE 1 MG/ML IJ SOLN
INTRAMUSCULAR | Status: AC
  Administered 2018-09-13: 0.25 mg via SUBCUTANEOUS
  Filled 2018-09-13: qty 1

## 2018-09-13 MED ORDER — OXYCODONE-ACETAMINOPHEN 5-325 MG PO TABS
2.0000 | ORAL_TABLET | Freq: Once | ORAL | Status: AC
  Administered 2018-09-13: 2 via ORAL
  Filled 2018-09-13: qty 2

## 2018-09-13 MED ORDER — TERBUTALINE SULFATE 1 MG/ML IJ SOLN
0.2500 mg | Freq: Once | INTRAMUSCULAR | Status: AC
  Administered 2018-09-13: 0.25 mg via SUBCUTANEOUS

## 2018-09-13 NOTE — MAU Provider Note (Signed)
Chief Complaint:  Contractions   None     HPI: [name] is a 37 y.o. [id number] at [redacted]w[redacted]d pt of [facility] with hx HTN previous pregnancy, GDM this pregnancy who presents to maternity admissions reporting regular painful contractions and pelvic pressure.  She is moaning and crying with pain upon arrival.  There are no other symptoms. She took Procardia XL tonight as prescribed.  She is getting weekly 17-P injections due to her history of preterm delivery.   She reports good fetal movement, denies LOF, vaginal bleeding, vaginal itching/burning, urinary symptoms, h/a, dizziness, n/v, or fever/chills.    HPI  Past Medical History: Past Medical History:  Diagnosis Date  . Diabetes mellitus without complication (HCC)   . GERD (gastroesophageal reflux disease)   . History of cocaine use   . Hypertension   . Kidney stones   . Ovarian cyst   . Pregnancy induced hypertension     Past obstetric history: OB History  Gravida Para Term Preterm AB Living  6 5 4 1   5   SAB TAB Ectopic Multiple Live Births        0 5    # Outcome Date GA Lbr Len/2nd Weight Sex Delivery Anes PTL Lv  6 Current           5 Preterm 01/13/16 [redacted]w[redacted]d 05:59 / 00:04 2310 g M Vag-Spont None  LIV  4 Term 01/10/14 [redacted]w[redacted]d 09:00 / 04:13 3496 g M Vag-Spont EPI  LIV     Birth Comments: Mom 33 y/o G4P4. Labs neg. Gestational HTN on [place]. Baby 37 w, NSVD, L hydronephrosis which was normal at 36 w U/S. Had phototherapy due to 10 bili at 24 hrs. Remained at 10. Requested repeat today.  Mom O+/ baby O+.   3 Term 01/10/06 [redacted]w[redacted]d  3402 g F Vag-Spont EPI  LIV  2 Term 09/02/01 [redacted]w[redacted]d  4139 g M Vag-Spont EPI  LIV  1 Term 01/24/97 [redacted]w[redacted]d  2835 g F Vag-Spont EPI  LIV    Past Surgical History: Past Surgical History:  Procedure Laterality Date  . APPENDECTOMY    . HEMORRHOID SURGERY N/A 01/19/2016   Procedure: EXTENSIVE HEMORRHOIDECTOMY;  Surgeon: Franky Macho, MD;  Location: [facility];  Service: General;  Laterality: N/A;  .  kidney stents    . LITHOTRIPSY      Family History: Family History  Problem Relation Age of Onset  . Cancer Mother        breast  . Diabetes Maternal Grandmother     Social History: Social History   Tobacco Use  . Smoking status: Never Smoker  . Smokeless tobacco: Never Used  Substance Use Topics  . Alcohol use: No  . Drug use: No    Comment: Hx Cocaine use    Allergies: No Known Allergies  Meds:  No medications prior to admission.    ROS:  Review of Systems  Constitutional: Negative for chills, fatigue and fever.  Eyes: Negative for visual disturbance.  Respiratory: Negative for shortness of breath.   Cardiovascular: Negative for chest pain.  Gastrointestinal: Positive for abdominal pain. Negative for nausea and vomiting.  Genitourinary: Positive for pelvic pain. Negative for difficulty urinating, dysuria, flank pain, vaginal bleeding, vaginal discharge and vaginal pain.  Musculoskeletal: Positive for back pain.  Neurological: Negative for dizziness and headaches.  Psychiatric/Behavioral: Negative.      I have reviewed patient's Past Medical Hx, Surgical Hx, Family Hx, Social Hx, medications and allergies.   Physical Exam   Patient  Vitals for the past 24 hrs:  BP Temp Pulse Resp  09/12/18 2358 (!) 107/54 - (!) 102 -  09/12/18 2334 114/61 - - -  09/12/18 2307 113/61 - - -  09/12/18 2226 136/71 98.2 F (36.8 C) (!) 102 20   Constitutional: Well-developed, well-nourished female in no acute distress.  Cardiovascular: normal rate Respiratory: normal effort GI: Abd soft, non-tender, gravid appropriate for gestational age.  MS: Extremities nontender, no edema, normal ROM Neurologic: Alert and oriented x 4.  GU: Neg CVAT.  Dilation: 4.5 Effacement (%): 50 Station: -2 Presentation: Vertex Exam by:: L Leftwich Kirby  FHT:  Baseline 150 , moderate variability, accelerations present, no decelerations Contractions: q 3 mins, mild to moderate to palpation    Labs: Results for orders placed or performed during the hospital encounter of 09/12/18 (from the past 24 hour(s))  Type and screen [facility] [place]     Status: None   Collection Time: 09/12/18  9:40 PM  Result Value Ref Range   ABO/RH(D) O POS    Antibody Screen NEG    Sample Expiration      09/15/2018 Performed at [facility], [address], [place], [place] [address]   CBC     Status: Abnormal   Collection Time: 09/12/18 10:40 PM  Result Value Ref Range   WBC 10.8 (H) 4.0 - 10.5 K/uL   RBC 3.18 (L) 3.87 - 5.11 MIL/uL   Hemoglobin 9.5 (L) 12.0 - 15.0 g/dL   HCT 60.4 (L) 54.0 - 98.1 %   MCV 89.6 78.0 - 100.0 fL   MCH 29.9 26.0 - 34.0 pg   MCHC 33.3 30.0 - 36.0 g/dL   RDW 19.1 (H) 47.8 - 29.5 %   Platelets 192 150 - 400 K/uL   --/--/O POS (09/21 2140)  Imaging:    MAU Course/MDM: Upon pt arrival, called to room by RN for cervical exam emergently since pt was moaning and feeling pelvic pressure.  Pt reported her cervix was 2 cm when discharged from the hospital on 9/21. Cervix 4 cm on arrival to MAU today.  Upon further chart review and consult with Dr Macon Large, cervix was 2.5-3 cm in hospital today so 4 cm is small amount of change Pt remained in MAU x 2+ hours and cervix unchanged so no evidence of preterm labor Pt continued to rock, moan, and report pelvic pressure during administration of Procardia 10 mg Q 20 min x 3 doses Terbutaline 0.25 mg and Percocet 5/325 x 2 tabs given. Pt reports contractions less painful and frequent. NST reviewed and reactive Consult Anyanwu with presentation, exam findings and test results.  Pt discharge with strict preterm labor/return precautions.  Today's evaluation included a work-up for preterm labor which can be life-threatening for both mom and baby.  Assessment: 1. Preterm labor in third trimester without delivery   2. History of cocaine use     Plan: Discharge home Labor precautions and fetal kick  counts Follow-up Information    [facility] Follow up.   Why:  As scheduled, return to MAU as needed for emergencies Contact information: [address] [place] [address] [phone]         Allergies as of 09/13/2018   No Known Allergies     Medication List    STOP taking these medications   aspirin EC 81 MG tablet   Doxylamine-Pyridoxine 10-10 MG Tbec   ferrous fumarate 325 (106 Fe) MG Tabs tablet Commonly known  as:  HEMOCYTE - 106 mg FE   ondansetron 4 MG disintegrating tablet Commonly known as:  ZOFRAN-ODT   pantoprazole 40 MG tablet Commonly known as:  PROTONIX   promethazine 25 MG suppository Commonly known as:  PHENERGAN     TAKE these medications   acetaminophen 325 MG tablet Commonly known as:  TYLENOL Take 650 mg by mouth every 6 (six) hours as needed.   COMFORT FIT MATERNITY SUPP LG Misc 1 Units by Does not apply route daily.   NIFEdipine 30 MG 24 hr tablet Commonly known as:  PROCARDIA-XL/ADALAT CC Take 1 tablet (30 mg total) by mouth 2 (two) times daily.   prenatal multivitamin Tabs tablet Take 1 tablet by mouth daily at 12 noon.       Sharen CounterLisa Leftwich-Kirby Certified Nurse-Midwife 09/13/2018 5:20 AM

## 2018-09-13 NOTE — Discharge Instructions (Signed)

## 2018-09-14 LAB — RPR: RPR: NONREACTIVE

## 2018-09-15 ENCOUNTER — Ambulatory Visit: Payer: Medicaid Other

## 2018-09-15 VITALS — BP 119/76 | HR 85 | Wt 183.0 lb

## 2018-09-15 DIAGNOSIS — O09212 Supervision of pregnancy with history of pre-term labor, second trimester: Principal | ICD-10-CM

## 2018-09-15 DIAGNOSIS — O09892 Supervision of other high risk pregnancies, second trimester: Secondary | ICD-10-CM

## 2018-09-15 NOTE — Progress Notes (Signed)
Pt is in the office for 17-p injection, administered and pt tolerated well .Marland Kitchen. Administrations This Visit    HYDROXYprogesterone Caproate SOAJ 275 mg    Admin Date 09/15/2018 Action Given Dose 275 mg Route Subcutaneous Administered By Katrina StackStalling, Jannice Beitzel D, RN

## 2018-09-17 ENCOUNTER — Observation Stay (HOSPITAL_COMMUNITY)
Admission: AD | Admit: 2018-09-17 | Discharge: 2018-09-18 | Disposition: A | Discharge status: Home / Self Care | Payer: Medicaid Other | Source: Ambulatory Visit | Attending: Obstetrics & Gynecology | Admitting: Obstetrics & Gynecology

## 2018-09-17 DIAGNOSIS — O24419 Gestational diabetes mellitus in pregnancy, unspecified control: Secondary | ICD-10-CM | POA: Insufficient documentation

## 2018-09-17 DIAGNOSIS — O09212 Supervision of pregnancy with history of pre-term labor, second trimester: Secondary | ICD-10-CM

## 2018-09-17 DIAGNOSIS — Z79899 Other long term (current) drug therapy: Secondary | ICD-10-CM | POA: Insufficient documentation

## 2018-09-17 DIAGNOSIS — F1491 Cocaine use, unspecified, in remission: Secondary | ICD-10-CM

## 2018-09-17 DIAGNOSIS — O09523 Supervision of elderly multigravida, third trimester: Secondary | ICD-10-CM | POA: Insufficient documentation

## 2018-09-17 DIAGNOSIS — Z3A34 34 weeks gestation of pregnancy: Secondary | ICD-10-CM | POA: Insufficient documentation

## 2018-09-17 DIAGNOSIS — O0990 Supervision of high risk pregnancy, unspecified, unspecified trimester: Secondary | ICD-10-CM

## 2018-09-17 DIAGNOSIS — O4703 False labor before 37 completed weeks of gestation, third trimester: Principal | ICD-10-CM | POA: Insufficient documentation

## 2018-09-17 DIAGNOSIS — O09293 Supervision of pregnancy with other poor reproductive or obstetric history, third trimester: Secondary | ICD-10-CM | POA: Diagnosis present

## 2018-09-17 DIAGNOSIS — O09892 Supervision of other high risk pregnancies, second trimester: Secondary | ICD-10-CM

## 2018-09-17 DIAGNOSIS — Z8751 Personal history of pre-term labor: Secondary | ICD-10-CM

## 2018-09-17 DIAGNOSIS — F1490 Cocaine use, unspecified, uncomplicated: Secondary | ICD-10-CM | POA: Insufficient documentation

## 2018-09-17 DIAGNOSIS — N83201 Unspecified ovarian cyst, right side: Secondary | ICD-10-CM | POA: Diagnosis present

## 2018-09-17 DIAGNOSIS — O09529 Supervision of elderly multigravida, unspecified trimester: Secondary | ICD-10-CM

## 2018-09-17 DIAGNOSIS — Z87898 Personal history of other specified conditions: Secondary | ICD-10-CM

## 2018-09-17 DIAGNOSIS — F1411 Cocaine abuse, in remission: Secondary | ICD-10-CM | POA: Diagnosis present

## 2018-09-17 DIAGNOSIS — O99323 Drug use complicating pregnancy, third trimester: Secondary | ICD-10-CM | POA: Insufficient documentation

## 2018-09-17 DIAGNOSIS — I10 Essential (primary) hypertension: Secondary | ICD-10-CM

## 2018-09-17 DIAGNOSIS — O163 Unspecified maternal hypertension, third trimester: Secondary | ICD-10-CM | POA: Insufficient documentation

## 2018-09-17 DIAGNOSIS — O4700 False labor before 37 completed weeks of gestation, unspecified trimester: Secondary | ICD-10-CM | POA: Diagnosis present

## 2018-09-17 DIAGNOSIS — O24410 Gestational diabetes mellitus in pregnancy, diet controlled: Secondary | ICD-10-CM | POA: Diagnosis present

## 2018-09-18 ENCOUNTER — Encounter (HOSPITAL_COMMUNITY): Payer: Self-pay | Admitting: *Deleted

## 2018-09-18 ENCOUNTER — Other Ambulatory Visit: Payer: Self-pay

## 2018-09-18 ENCOUNTER — Inpatient Hospital Stay (HOSPITAL_COMMUNITY): Payer: Medicaid Other | Admitting: Anesthesiology

## 2018-09-18 DIAGNOSIS — Z79899 Other long term (current) drug therapy: Secondary | ICD-10-CM | POA: Diagnosis not present

## 2018-09-18 DIAGNOSIS — O4703 False labor before 37 completed weeks of gestation, third trimester: Secondary | ICD-10-CM | POA: Diagnosis not present

## 2018-09-18 DIAGNOSIS — O24419 Gestational diabetes mellitus in pregnancy, unspecified control: Secondary | ICD-10-CM | POA: Diagnosis not present

## 2018-09-18 DIAGNOSIS — F1490 Cocaine use, unspecified, uncomplicated: Secondary | ICD-10-CM | POA: Diagnosis not present

## 2018-09-18 DIAGNOSIS — Z3A34 34 weeks gestation of pregnancy: Secondary | ICD-10-CM | POA: Diagnosis not present

## 2018-09-18 DIAGNOSIS — O99323 Drug use complicating pregnancy, third trimester: Secondary | ICD-10-CM | POA: Diagnosis not present

## 2018-09-18 DIAGNOSIS — O09523 Supervision of elderly multigravida, third trimester: Secondary | ICD-10-CM | POA: Diagnosis not present

## 2018-09-18 DIAGNOSIS — O163 Unspecified maternal hypertension, third trimester: Secondary | ICD-10-CM | POA: Diagnosis not present

## 2018-09-18 LAB — URINALYSIS, ROUTINE W REFLEX MICROSCOPIC
BILIRUBIN URINE: NEGATIVE
Glucose, UA: NEGATIVE mg/dL
HGB URINE DIPSTICK: NEGATIVE
Ketones, ur: NEGATIVE mg/dL
NITRITE: NEGATIVE
PROTEIN: NEGATIVE mg/dL
Specific Gravity, Urine: 1.009 (ref 1.005–1.030)
pH: 7 (ref 5.0–8.0)

## 2018-09-18 LAB — COMPREHENSIVE METABOLIC PANEL
ALK PHOS: 77 U/L (ref 38–126)
ALT: 23 U/L (ref 0–44)
AST: 28 U/L (ref 15–41)
Albumin: 3.1 g/dL — ABNORMAL LOW (ref 3.5–5.0)
Anion gap: 11 (ref 5–15)
BUN: 10 mg/dL (ref 6–20)
CALCIUM: 9.7 mg/dL (ref 8.9–10.3)
CHLORIDE: 103 mmol/L (ref 98–111)
CO2: 21 mmol/L — ABNORMAL LOW (ref 22–32)
CREATININE: 0.75 mg/dL (ref 0.44–1.00)
Glucose, Bld: 103 mg/dL — ABNORMAL HIGH (ref 70–99)
Potassium: 3.8 mmol/L (ref 3.5–5.1)
Sodium: 135 mmol/L (ref 135–145)
Total Bilirubin: 0.5 mg/dL (ref 0.3–1.2)
Total Protein: 6.7 g/dL (ref 6.5–8.1)

## 2018-09-18 LAB — GLUCOSE, CAPILLARY
Glucose-Capillary: 92 mg/dL (ref 70–99)
Glucose-Capillary: 94 mg/dL (ref 70–99)

## 2018-09-18 LAB — TYPE AND SCREEN
ABO/RH(D): O POS
Antibody Screen: NEGATIVE

## 2018-09-18 LAB — RAPID URINE DRUG SCREEN, HOSP PERFORMED
Amphetamines: NOT DETECTED
Barbiturates: NOT DETECTED
Benzodiazepines: NOT DETECTED
COCAINE: NOT DETECTED
OPIATES: NOT DETECTED
Tetrahydrocannabinol: NOT DETECTED

## 2018-09-18 LAB — CBC
HCT: 33.1 % — ABNORMAL LOW (ref 36.0–46.0)
Hemoglobin: 11.3 g/dL — ABNORMAL LOW (ref 12.0–15.0)
MCH: 29.4 pg (ref 26.0–34.0)
MCHC: 34.1 g/dL (ref 30.0–36.0)
MCV: 86.2 fL (ref 78.0–100.0)
PLATELETS: 263 10*3/uL (ref 150–400)
RBC: 3.84 MIL/uL — AB (ref 3.87–5.11)
RDW: 15.5 % (ref 11.5–15.5)
WBC: 12.5 10*3/uL — ABNORMAL HIGH (ref 4.0–10.5)

## 2018-09-18 MED ORDER — FENTANYL 2.5 MCG/ML BUPIVACAINE 1/10 % EPIDURAL INFUSION (WH - ANES)
14.0000 mL/h | INTRAMUSCULAR | Status: DC | PRN
  Administered 2018-09-18: 14 mL/h via EPIDURAL

## 2018-09-18 MED ORDER — SODIUM CHLORIDE 0.9 % IV SOLN
2.0000 g | Freq: Four times a day (QID) | INTRAVENOUS | Status: DC
  Administered 2018-09-18 (×2): 2 g via INTRAVENOUS
  Filled 2018-09-18 (×2): qty 2000
  Filled 2018-09-18 (×2): qty 2

## 2018-09-18 MED ORDER — TERBUTALINE SULFATE 1 MG/ML IJ SOLN
0.2500 mg | Freq: Once | INTRAMUSCULAR | Status: AC
  Administered 2018-09-18: 0.25 mg via SUBCUTANEOUS
  Filled 2018-09-18: qty 1

## 2018-09-18 MED ORDER — LACTATED RINGERS IV SOLN
INTRAVENOUS | Status: DC
  Administered 2018-09-18: 01:00:00 via INTRAVENOUS

## 2018-09-18 MED ORDER — SOD CITRATE-CITRIC ACID 500-334 MG/5ML PO SOLN: 30.0000 mL | ORAL | Status: DC | PRN

## 2018-09-18 MED ORDER — PHENYLEPHRINE 40 MCG/ML (10ML) SYRINGE FOR IV PUSH (FOR BLOOD PRESSURE SUPPORT): 80.0000 ug | PREFILLED_SYRINGE | INTRAVENOUS | Status: DC | PRN

## 2018-09-18 MED ORDER — FLEET ENEMA 7-19 GM/118ML RE ENEM: 1.0000 | ENEMA | RECTAL | Status: DC | PRN

## 2018-09-18 MED ORDER — ACETAMINOPHEN 325 MG PO TABS
650.0000 mg | ORAL_TABLET | ORAL | Status: DC | PRN
  Administered 2018-09-18: 650 mg via ORAL
  Filled 2018-09-18: qty 2

## 2018-09-18 MED ORDER — FAMOTIDINE IN NACL 20-0.9 MG/50ML-% IV SOLN: 20.0000 mg | Freq: Two times a day (BID) | INTRAVENOUS | Status: DC

## 2018-09-18 MED ORDER — ONDANSETRON HCL 4 MG/2ML IJ SOLN: 4.0000 mg | Freq: Four times a day (QID) | INTRAMUSCULAR | Status: DC | PRN

## 2018-09-18 MED ORDER — NIFEDIPINE 10 MG PO CAPS
10.0000 mg | ORAL_CAPSULE | ORAL | Status: DC | PRN
  Administered 2018-09-18 (×3): 10 mg via ORAL
  Filled 2018-09-18 (×4): qty 1

## 2018-09-18 MED ORDER — OXYCODONE-ACETAMINOPHEN 5-325 MG PO TABS: 1.0000 | ORAL_TABLET | ORAL | Status: DC | PRN

## 2018-09-18 MED ORDER — LACTATED RINGERS IV SOLN
500.0000 mL | Freq: Once | INTRAVENOUS | Status: AC
  Administered 2018-09-18: 500 mL via INTRAVENOUS

## 2018-09-18 MED ORDER — LACTATED RINGERS IV SOLN
INTRAVENOUS | Status: DC
  Administered 2018-09-18 (×2): via INTRAVENOUS

## 2018-09-18 MED ORDER — LACTATED RINGERS IV SOLN: 500.0000 mL | INTRAVENOUS | Status: DC | PRN

## 2018-09-18 MED ORDER — FAMOTIDINE IN NACL 20-0.9 MG/50ML-% IV SOLN
20.0000 mg | Freq: Two times a day (BID) | INTRAVENOUS | Status: DC
  Administered 2018-09-18: 20 mg via INTRAVENOUS
  Filled 2018-09-18: qty 50

## 2018-09-18 MED ORDER — OXYCODONE-ACETAMINOPHEN 5-325 MG PO TABS
1.0000 | ORAL_TABLET | Freq: Once | ORAL | Status: AC
  Administered 2018-09-18: 1 via ORAL
  Filled 2018-09-18: qty 1

## 2018-09-18 MED ORDER — FENTANYL 2.5 MCG/ML BUPIVACAINE 1/10 % EPIDURAL INFUSION (WH - ANES)
INTRAMUSCULAR | Status: AC
  Filled 2018-09-18: qty 100

## 2018-09-18 MED ORDER — LACTATED RINGERS IV BOLUS
1000.0000 mL | Freq: Once | INTRAVENOUS | Status: AC
  Administered 2018-09-18: 1000 mL via INTRAVENOUS

## 2018-09-18 MED ORDER — LIDOCAINE HCL (PF) 1 % IJ SOLN
INTRAMUSCULAR | Status: DC | PRN
  Administered 2018-09-18: 6 mL via EPIDURAL
  Administered 2018-09-18: 4 mL via EPIDURAL

## 2018-09-18 MED ORDER — PHENYLEPHRINE 40 MCG/ML (10ML) SYRINGE FOR IV PUSH (FOR BLOOD PRESSURE SUPPORT)
PREFILLED_SYRINGE | INTRAVENOUS | Status: AC
  Filled 2018-09-18: qty 10

## 2018-09-18 MED ORDER — OXYTOCIN BOLUS FROM INFUSION: 500.0000 mL | Freq: Once | INTRAVENOUS | Status: DC

## 2018-09-18 MED ORDER — ONDANSETRON 4 MG PO TBDP
4.0000 mg | ORAL_TABLET | Freq: Once | ORAL | Status: AC
  Administered 2018-09-18: 4 mg via ORAL
  Filled 2018-09-18: qty 1

## 2018-09-18 MED ORDER — EPHEDRINE 5 MG/ML INJ: 10.0000 mg | INTRAVENOUS | Status: DC | PRN

## 2018-09-18 MED ORDER — LIDOCAINE HCL (PF) 1 % IJ SOLN: 30.0000 mL | INTRAMUSCULAR | Status: DC | PRN

## 2018-09-18 MED ORDER — OXYTOCIN 40 UNITS IN LACTATED RINGERS INFUSION - SIMPLE MED: 2.5000 [IU]/h | INTRAVENOUS | Status: DC

## 2018-09-18 MED ORDER — DIPHENHYDRAMINE HCL 50 MG/ML IJ SOLN: 12.5000 mg | INTRAMUSCULAR | Status: DC | PRN

## 2018-09-18 MED ORDER — OXYCODONE-ACETAMINOPHEN 5-325 MG PO TABS: 2.0000 | ORAL_TABLET | ORAL | Status: DC | PRN

## 2018-09-18 MED ORDER — PROMETHAZINE HCL 25 MG/ML IJ SOLN
12.5000 mg | Freq: Once | INTRAMUSCULAR | Status: AC
  Administered 2018-09-18: 12.5 mg via INTRAVENOUS
  Filled 2018-09-18: qty 1

## 2018-09-18 NOTE — MAU Note (Signed)
Pt vomited at this time

## 2018-09-18 NOTE — Discharge Instructions (Signed)
Braxton Hicks Contractions °Contractions of the uterus can occur throughout pregnancy, but they are not always a sign that you are in labor. You may have practice contractions called Braxton Hicks contractions. These false labor contractions are sometimes confused with true labor. °What are Braxton Hicks contractions? °Braxton Hicks contractions are tightening movements that occur in the muscles of the uterus before labor. Unlike true labor contractions, these contractions do not result in opening (dilation) and thinning of the cervix. Toward the end of pregnancy (32-34 weeks), Braxton Hicks contractions can happen more often and may become stronger. These contractions are sometimes difficult to tell apart from true labor because they can be very uncomfortable. You should not feel embarrassed if you go to the hospital with false labor. °Sometimes, the only way to tell if you are in true labor is for your health care provider to look for changes in the cervix. The health care provider will do a physical exam and may monitor your contractions. If you are not in true labor, the exam should show that your cervix is not dilating and your water has not broken. °If there are other health problems associated with your pregnancy, it is completely safe for you to be sent home with false labor. You may continue to have Braxton Hicks contractions until you go into true labor. °How to tell the difference between true labor and false labor °True labor °· Contractions last 30-70 seconds. °· Contractions become very regular. °· Discomfort is usually felt in the top of the uterus, and it spreads to the lower abdomen and low back. °· Contractions do not go away with walking. °· Contractions usually become more intense and increase in frequency. °· The cervix dilates and gets thinner. °False labor °· Contractions are usually shorter and not as strong as true labor contractions. °· Contractions are usually irregular. °· Contractions  are often felt in the front of the lower abdomen and in the groin. °· Contractions may go away when you walk around or change positions while lying down. °· Contractions get weaker and are shorter-lasting as time goes on. °· The cervix usually does not dilate or become thin. °Follow these instructions at home: °· Take over-the-counter and prescription medicines only as told by your health care provider. °· Keep up with your usual exercises and follow other instructions from your health care provider. °· Eat and drink lightly if you think you are going into labor. °· If Braxton Hicks contractions are making you uncomfortable: °? Change your position from lying down or resting to walking, or change from walking to resting. °? Sit and rest in a tub of warm water. °? Drink enough fluid to keep your urine pale yellow. Dehydration may cause these contractions. °? Do slow and deep breathing several times an hour. °· Keep all follow-up prenatal visits as told by your health care provider. This is important. °Contact a health care provider if: °· You have a fever. °· You have continuous pain in your abdomen. °Get help right away if: °· Your contractions become stronger, more regular, and closer together. °· You have fluid leaking or gushing from your vagina. °· You pass blood-tinged mucus (bloody show). °· You have bleeding from your vagina. °· You have low back pain that you never had before. °· You feel your baby’s head pushing down and causing pelvic pressure. °· Your baby is not moving inside you as much as it used to. °Summary °· Contractions that occur before labor are called Braxton   Hicks contractions, false labor, or practice contractions. °· Braxton Hicks contractions are usually shorter, weaker, farther apart, and less regular than true labor contractions. True labor contractions usually become progressively stronger and regular and they become more frequent. °· Manage discomfort from Braxton Hicks contractions by  changing position, resting in a warm bath, drinking plenty of water, or practicing deep breathing. °This information is not intended to replace advice given to you by your health care provider. Make sure you discuss any questions you have with your health care provider. °Document Released: 04/24/2017 Document Revised: 04/24/2017 Document Reviewed: 04/24/2017 °Elsevier Interactive Patient Education © 2018 [employer] ° °

## 2018-09-18 NOTE — Anesthesia Pain Management Evaluation Note (Signed)
  [facility] Pain Management Visit Note  Patient: [name], 37 y.o., female  "Hello I am a member of the anesthesia team at [facility]. We have an anesthesia team available at all times to provide care throughout the hospital, including epidural management and anesthesia for C-section. I don't know your plan for the delivery whether it a natural birth, water birth, IV sedation, nitrous supplementation, doula or epidural, but we want to meet your pain goals."   1.Was your pain managed to your expectations on prior hospitalizations?   Yes   2.What is your expectation for pain management during this hospitalization?     Epidural  3.How can we help you reach that goal? Epidural in place  Record the patient's initial score and the patient's pain goal.   Pain: 2  Pain Goal: 5 [facility] wants you to be able to say your pain was always managed very well.  Dominique Ressel 09/18/2018

## 2018-09-18 NOTE — MAU Provider Note (Signed)
Chief Complaint:  Contractions  Provider saw patient at 0005 hrs   HPI: [name] is a 37 y.o. [id number] at 63w0dwho presents to maternity admissions reporting recurrent preterm contractions since earlier this evening about 8pm.  Has taken 3 dose of her Procardia at home with no improvement.  Has been treated several times for PTL      On 09/10/18, was admitted for MgSO4 neuroprophylaxis and tocolysis with Betamethasone series.  She was discharged home on 09/12/18  She returned the next day with contractions, was given Procardia and then given Terb and Percocet which quieted her labor.    She took Procardia tonight and contractions did not diminish. She reports good fetal movement, denies LOF, vaginal bleeding, vaginal itching/burning, urinary symptoms, h/a, dizziness, n/v, diarrhea, constipation or fever/chills.  .  Abdominal Pain  This is a recurrent problem. The current episode started today. The problem occurs intermittently. The problem has been gradually worsening. The quality of the pain is cramping. The abdominal pain radiates to the back. Associated symptoms include vomiting. Pertinent negatives include no constipation, diarrhea, dysuria, fever or nausea. Nothing aggravates the pain. Treatments tried: Procardia. The treatment provided no relief.   Patient Active Problem List   Diagnosis Date Noted  . Preterm labor 09/10/2018  . Unwanted fertility 08/11/2018  . Preterm contractions 08/05/2018  . History of cocaine use 07/28/2018  . Kidney stones 07/07/2018  . History of preterm delivery, currently pregnant in second trimester 06/30/2018  . Supervision of high risk pregnancy, antepartum 04/07/2018  . Advanced maternal age in multigravida 04/07/2018  . Prediabetes   . HLD (hyperlipidemia)   . TIA (transient ischemic attack) 07/30/2016  . Acute left-sided weakness 07/30/2016  . Cocaine use 01/16/2016  . Gestational diabetes mellitus, class A1 12/20/2015  . Anemia  09/11/2015  . Right ovarian cyst 08/30/2015  . HTN (hypertension) 01/09/2014      Past Medical History: Past Medical History:  Diagnosis Date  . Diabetes mellitus without complication (HCC)   . GERD (gastroesophageal reflux disease)   . History of cocaine use   . Hypertension   . Kidney stones   . Ovarian cyst   . Pregnancy induced hypertension     Past obstetric history: OB History  Gravida Para Term Preterm AB Living  6 5 4 1   5   SAB TAB Ectopic Multiple Live Births        0 5    # Outcome Date GA Lbr Len/2nd Weight Sex Delivery Anes PTL Lv  6 Current           5 Preterm 01/13/16 [redacted]w[redacted]d 05:59 / 00:04 2310 g M Vag-Spont None  LIV  4 Term 01/10/14 [redacted]w[redacted]d 09:00 / 04:13 3496 g M Vag-Spont EPI  LIV     Birth Comments: Mom 61 y/o G4P4. Labs neg. Gestational HTN on [place]. Baby 37 w, NSVD, L hydronephrosis which was normal at 36 w U/S. Had phototherapy due to 10 bili at 24 hrs. Remained at 10. Requested repeat today.  Mom O+/ baby O+.   3 Term 01/10/06 [redacted]w[redacted]d  3402 g F Vag-Spont EPI  LIV  2 Term 09/02/01 [redacted]w[redacted]d  4139 g M Vag-Spont EPI  LIV  1 Term 01/24/97 [redacted]w[redacted]d  2835 g F Vag-Spont EPI  LIV    Past Surgical History: Past Surgical History:  Procedure Laterality Date  . APPENDECTOMY    . HEMORRHOID SURGERY N/A 01/19/2016   Procedure: EXTENSIVE HEMORRHOIDECTOMY;  Surgeon: Franky Macho, MD;  Location: [facility];  Service: General;  Laterality: N/A;  . kidney stents    . LITHOTRIPSY      Family History: Family History  Problem Relation Age of Onset  . Cancer Mother        breast  . Diabetes Maternal Grandmother     Social History: Social History   Tobacco Use  . Smoking status: Never Smoker  . Smokeless tobacco: Never Used  Substance Use Topics  . Alcohol use: No  . Drug use: No    Comment: Hx Cocaine use    Allergies: No Known Allergies  Meds:  Facility-Administered Medications Prior to Admission  Medication Dose Route Frequency Provider Last Rate Last Dose   . HYDROXYprogesterone Caproate SOAJ 275 mg  275 mg Subcutaneous Weekly Denney, Rachelle A, CNM   275 mg at 09/15/18 7425   Medications Prior to Admission  Medication Sig Dispense Refill Last Dose  . acetaminophen (TYLENOL) 325 MG tablet Take 650 mg by mouth every 6 (six) hours as needed.   09/17/2018 at Unknown time  . Doxylamine-Pyridoxine (DICLEGIS PO) Take by mouth.   09/17/2018 at Unknown time  . NIFEdipine (PROCARDIA-XL/ADALAT CC) 30 MG 24 hr tablet Take 1 tablet (30 mg total) by mouth 2 (two) times daily. 60 tablet 1 09/17/2018 at Unknown time  . Prenatal Vit-Fe Fumarate-FA (PRENATAL MULTIVITAMIN) TABS tablet Take 1 tablet by mouth daily at 12 noon.   09/17/2018 at Unknown time  . Elastic Bandages & Supports (COMFORT FIT MATERNITY SUPP LG) MISC 1 Units by Does not apply route daily. 1 each 0 Taking    I have reviewed patient's Past Medical Hx, Surgical Hx, Family Hx, Social Hx, medications and allergies.   ROS:  Review of Systems  Constitutional: Negative for fever.  Gastrointestinal: Positive for abdominal pain and vomiting. Negative for constipation, diarrhea and nausea.  Genitourinary: Negative for dysuria.   Other systems negative  Physical Exam   Patient Vitals for the past 24 hrs:  BP  09/18/18 0024 134/77   Constitutional: Well-developed, well-nourished female in no acute distress, but crying out with contractions, writhing in the bed.  Cardiovascular: normal rate and rhythm Respiratory: normal effort, clear to auscultation bilaterally GI: Abd soft, non-tender, gravid appropriate for gestational age.   No rebound or guarding. MS: Extremities nontender, no edema, normal ROM Neurologic: Alert and oriented x 4.  GU: Neg CVAT.  PELVIC EXAM:  Dilation: 4(external os 5cm) Effacement (%): 60 Exam by:: Wynelle Bourgeois CNM    Nifedipine given at 0024 Recheck at 0049 was the same, pt still crying out  Zofran given at 0047 due to nausea Nifedipine given at 0051 Nifedipine  given at 0139 Recheck at 0217 was the same, patient still crying out  Percocet given at 0249 and Terbutaline given at 0234  Recheck at 0304 Dilation: 5 Effacement (%): 70 Station: -3 Presentation: Vertex Exam by:: Hatim Homann cnm   FHT:  Baseline 140 , moderate variability, accelerations present, no decelerations Contractions: q 2-4 mins Irregular     Labs: --/--/O POS (09/21 2140) Results for orders placed or performed during the hospital encounter of 09/17/18 (from the past 24 hour(s))  CBC     Status: Abnormal   Collection Time: 09/18/18  1:24 AM  Result Value Ref Range   WBC 12.5 (H) 4.0 - 10.5 K/uL   RBC 3.84 (L) 3.87 - 5.11 MIL/uL   Hemoglobin 11.3 (L) 12.0 - 15.0 g/dL   HCT 95.6 (L) 38.7 - 56.4 %   MCV 86.2 78.0 -  100.0 fL   MCH 29.4 26.0 - 34.0 pg   MCHC 34.1 30.0 - 36.0 g/dL   RDW 16.1 09.6 - 04.5 %   Platelets 263 150 - 400 K/uL  Comprehensive metabolic panel     Status: Abnormal   Collection Time: 09/18/18  1:24 AM  Result Value Ref Range   Sodium 135 135 - 145 mmol/L   Potassium 3.8 3.5 - 5.1 mmol/L   Chloride 103 98 - 111 mmol/L   CO2 21 (L) 22 - 32 mmol/L   Glucose, Bld 103 (H) 70 - 99 mg/dL   BUN 10 6 - 20 mg/dL   Creatinine, Ser 4.09 0.44 - 1.00 mg/dL   Calcium 9.7 8.9 - 81.1 mg/dL   Total Protein 6.7 6.5 - 8.1 g/dL   Albumin 3.1 (L) 3.5 - 5.0 g/dL   AST 28 15 - 41 U/L   ALT 23 0 - 44 U/L   Alkaline Phosphatase 77 38 - 126 U/L   Total Bilirubin 0.5 0.3 - 1.2 mg/dL   GFR calc non Af Amer >60 >60 mL/min   GFR calc Af Amer >60 >60 mL/min   Anion gap 11 5 - 15  Type and screen [facility] [place]     Status: None   Collection Time: 09/18/18  1:24 AM  Result Value Ref Range   ABO/RH(D) O POS    Antibody Screen NEG    Sample Expiration      09/21/2018 Performed at [facility], [address], [place], [place] [address]   Urinalysis, Routine w reflex microscopic     Status: Abnormal   Collection Time: 09/18/18  1:27 AM  Result  Value Ref Range   Color, Urine YELLOW YELLOW   APPearance CLEAR CLEAR   Specific Gravity, Urine 1.009 1.005 - 1.030   pH 7.0 5.0 - 8.0   Glucose, UA NEGATIVE NEGATIVE mg/dL   Hgb urine dipstick NEGATIVE NEGATIVE   Bilirubin Urine NEGATIVE NEGATIVE   Ketones, ur NEGATIVE NEGATIVE mg/dL   Protein, ur NEGATIVE NEGATIVE mg/dL   Nitrite NEGATIVE NEGATIVE   Leukocytes, UA SMALL (A) NEGATIVE   RBC / HPF 0-5 0 - 5 RBC/hpf   WBC, UA 11-20 0 - 5 WBC/hpf   Bacteria, UA RARE (A) NONE SEEN   Squamous Epithelial / LPF 0-5 0 - 5   Trans Epithel, UA <1    Mucus PRESENT   Urine rapid drug screen (hosp performed)     Status: None   Collection Time: 09/18/18  1:27 AM  Result Value Ref Range   Opiates NONE DETECTED NONE DETECTED   Cocaine NONE DETECTED NONE DETECTED   Benzodiazepines NONE DETECTED NONE DETECTED   Amphetamines NONE DETECTED NONE DETECTED   Tetrahydrocannabinol NONE DETECTED NONE DETECTED   Barbiturates NONE DETECTED NONE DETECTED    Imaging:    MAU Course/MDM: I have ordered labs and reviewed results. See above NST reviewed and is reactive See above for events, meds and cervical exams.  Consult Dr Erin Fulling with presentation, exam findings and test results.  Will admit now since she is changing her cervix.    Assessment: Single intrauterine pregnancy at [redacted]w[redacted]d Preterm uterine contractions S/P Magnesium Sulfate and Betamethasone series  Plan: Admit to Birthing Suites Routine orders GBS prophylaxis Labor team to follow  Wynelle Bourgeois CNM, MSN Certified Nurse-Midwife 09/18/2018 12:33 AM

## 2018-09-18 NOTE — H&P (Signed)
OBSTETRIC ADMISSION HISTORY AND PHYSICAL  [name] is a 37 y.o. female [phone] with IUP at [redacted]w[redacted]d by 11 wk u/s presenting for contractions. She reports +FMs, No LOF, no VB, no blurry vision, headaches or peripheral edema, and RUQ pain.  She plans on breast/bottle feeding. She request BTL for birth control. She received her prenatal care at [facility]   Dating: By 11 wk U/S --->  Estimated Date of Delivery: 10/30/18  Sono:  @[redacted]w[redacted]d , normal anatomy, cephalic presentation,  1823g, 74% EFW   Prenatal History/Complications: History of preterm delivery at 33 weeks Referral to ped cardiology for fetal VSD On 17-P Kidney stone AMA A1GMD cHTN BMZ on 9/19 and 9/20 UTI  Past Medical History: Past Medical History:  Diagnosis Date  . Diabetes mellitus without complication (HCC)   . GERD (gastroesophageal reflux disease)   . History of cocaine use   . Hypertension   . Kidney stones   . Ovarian cyst   . Pregnancy induced hypertension     Past Surgical History: Past Surgical History:  Procedure Laterality Date  . APPENDECTOMY    . HEMORRHOID SURGERY N/A 01/19/2016   Procedure: EXTENSIVE HEMORRHOIDECTOMY;  Surgeon: Franky Macho, MD;  Location: [facility];  Service: General;  Laterality: N/A;  . kidney stents    . LITHOTRIPSY      Obstetrical History: OB History    Gravida  6   Para  5   Term  4   Preterm  1   AB      Living  5     SAB      TAB      Ectopic      Multiple  0   Live Births  5           Social History: Social History   Socioeconomic History  . Marital status: Married    Spouse name: Not on file  . Number of children: Not on file  . Years of education: Not on file  . Highest education level: Not on file  Occupational History  . Not on file  Social Needs  . Financial resource strain: Not on file  . Food insecurity:    Worry: Not on file    Inability: Not on file  . Transportation needs:    Medical: Not on file    Non-medical:  Not on file  Tobacco Use  . Smoking status: Never Smoker  . Smokeless tobacco: Never Used  Substance and Sexual Activity  . Alcohol use: No  . Drug use: No    Comment: Hx Cocaine use  . Sexual activity: Yes    Birth control/protection: None  Lifestyle  . Physical activity:    Days per week: Not on file    Minutes per session: Not on file  . Stress: Not on file  Relationships  . Social connections:    Talks on phone: Not on file    Gets together: Not on file    Attends religious service: Not on file    Active member of club or organization: Not on file    Attends meetings of clubs or organizations: Not on file    Relationship status: Not on file  Other Topics Concern  . Not on file  Social History Narrative  . Not on file    Family History: Family History  Problem Relation Age of Onset  . Cancer Mother        breast  . Diabetes Maternal Grandmother  Allergies: No Known Allergies  Facility-Administered Medications Prior to Admission  Medication Dose Route Frequency Provider Last Rate Last Dose  . HYDROXYprogesterone Caproate SOAJ 275 mg  275 mg Subcutaneous Weekly Denney, Rachelle A, CNM   275 mg at 09/15/18 4098   Medications Prior to Admission  Medication Sig Dispense Refill Last Dose  . acetaminophen (TYLENOL) 325 MG tablet Take 650 mg by mouth every 6 (six) hours as needed.   09/17/2018 at Unknown time  . Doxylamine-Pyridoxine (DICLEGIS PO) Take by mouth.   09/17/2018 at Unknown time  . NIFEdipine (PROCARDIA-XL/ADALAT CC) 30 MG 24 hr tablet Take 1 tablet (30 mg total) by mouth 2 (two) times daily. 60 tablet 1 09/17/2018 at Unknown time  . Prenatal Vit-Fe Fumarate-FA (PRENATAL MULTIVITAMIN) TABS tablet Take 1 tablet by mouth daily at 12 noon.   09/17/2018 at Unknown time  . Elastic Bandages & Supports (COMFORT FIT MATERNITY SUPP LG) MISC 1 Units by Does not apply route daily. 1 each 0 Taking     Review of Systems   All systems reviewed and negative except as  stated in HPI  Blood pressure 116/64, pulse (!) 101, temperature 97.9 F (36.6 C), temperature source Oral, resp. rate 16, height 5\' 3"  (1.6 m), weight 83.5 kg, last menstrual period 01/12/2018, SpO2 100 %. General appearance: alert, cooperative, appears stated age and no distress Lungs: clear to auscultation bilaterally Heart: regular rate and rhythm Abdomen: soft, non-tender; bowel sounds normal Pelvic: see below Extremities: Homans sign is negative, no sign of DVT Fetal monitoringBaseline: 150 bpm, Variability: Good {> 6 bpm), Accelerations: Reactive and Decelerations: Variable: mild Uterine activityFrequency: Every 5-10 minutes Dilation: 5 Effacement (%): 70 Station: -3 Exam by:: marie williams cnm    Prenatal labs: ABO, Rh: --/--/O POS (09/27 0124) Antibody: NEG (09/27 0124) Rubella: 1.66 (04/16 1115) RPR: Non Reactive (09/21 2240)  HBsAg: Negative (04/16 1115)  HIV: Non Reactive (08/20 1122)  GBS:   Negative 2 hr GTT: fail, 98/207/130 Genetic screening  normal Anatomy US possible VSD with early U/S, no abnormalities seen in [redacted]w[redacted]d U/S  Prenatal Transfer Tool  Maternal Diabetes: Yes:  Diabetes Type:  Diet controlled Genetic Screening: Normal Maternal Ultrasounds/Referrals: Normal Fetal Ultrasounds or other Referrals:  Fetal echo, done at [facility], normal per pt. Maternal Substance Abuse:  Yes:  Type: Cocaine Significant Maternal Medications:  Meds include: Other: procardia,  Significant Maternal Lab Results: None  Results for orders placed or performed during the hospital encounter of 09/17/18 (from the past 24 hour(s))  CBC   Collection Time: 09/18/18  1:24 AM  Result Value Ref Range   WBC 12.5 (H) 4.0 - 10.5 K/uL   RBC 3.84 (L) 3.87 - 5.11 MIL/uL   Hemoglobin 11.3 (L) 12.0 - 15.0 g/dL   HCT 11.9 (L) 14.7 - 82.9 %   MCV 86.2 78.0 - 100.0 fL   MCH 29.4 26.0 - 34.0 pg   MCHC 34.1 30.0 - 36.0 g/dL   RDW 56.2 13.0 - 86.5 %   Platelets 263 150 - 400 K/uL  Comprehensive  metabolic panel   Collection Time: 09/18/18  1:24 AM  Result Value Ref Range   Sodium 135 135 - 145 mmol/L   Potassium 3.8 3.5 - 5.1 mmol/L   Chloride 103 98 - 111 mmol/L   CO2 21 (L) 22 - 32 mmol/L   Glucose, Bld 103 (H) 70 - 99 mg/dL   BUN 10 6 - 20 mg/dL   Creatinine, Ser 7.84 0.44 - 1.00 mg/dL  Calcium 9.7 8.9 - 10.3 mg/dL   Total Protein 6.7 6.5 - 8.1 g/dL   Albumin 3.1 (L) 3.5 - 5.0 g/dL   AST 28 15 - 41 U/L   ALT 23 0 - 44 U/L   Alkaline Phosphatase 77 38 - 126 U/L   Total Bilirubin 0.5 0.3 - 1.2 mg/dL   GFR calc non Af Amer >60 >60 mL/min   GFR calc Af Amer >60 >60 mL/min   Anion gap 11 5 - 15  Type and screen [facility] [place]   Collection Time: 09/18/18  1:24 AM  Result Value Ref Range   ABO/RH(D) O POS    Antibody Screen NEG    Sample Expiration      09/21/2018 Performed at [facility], [address] [place], [place] [address]   Urinalysis, Routine w reflex microscopic   Collection Time: 09/18/18  1:27 AM  Result Value Ref Range   Color, Urine YELLOW YELLOW   APPearance CLEAR CLEAR   Specific Gravity, Urine 1.009 1.005 - 1.030   pH 7.0 5.0 - 8.0   Glucose, UA NEGATIVE NEGATIVE mg/dL   Hgb urine dipstick NEGATIVE NEGATIVE   Bilirubin Urine NEGATIVE NEGATIVE   Ketones, ur NEGATIVE NEGATIVE mg/dL   Protein, ur NEGATIVE NEGATIVE mg/dL   Nitrite NEGATIVE NEGATIVE   Leukocytes, UA SMALL (A) NEGATIVE   RBC / HPF 0-5 0 - 5 RBC/hpf   WBC, UA 11-20 0 - 5 WBC/hpf   Bacteria, UA RARE (A) NONE SEEN   Squamous Epithelial / LPF 0-5 0 - 5   Trans Epithel, UA <1    Mucus PRESENT   Urine rapid drug screen (hosp performed)   Collection Time: 09/18/18  1:27 AM  Result Value Ref Range   Opiates NONE DETECTED NONE DETECTED   Cocaine NONE DETECTED NONE DETECTED   Benzodiazepines NONE DETECTED NONE DETECTED   Amphetamines NONE DETECTED NONE DETECTED   Tetrahydrocannabinol NONE DETECTED NONE DETECTED   Barbiturates NONE DETECTED NONE DETECTED     Patient Active Problem List   Diagnosis Date Noted  . Preterm labor 09/10/2018  . Unwanted fertility 08/11/2018  . Preterm contractions 08/05/2018  . History of cocaine use 07/28/2018  . Kidney stones 07/07/2018  . History of preterm delivery, currently pregnant in second trimester 06/30/2018  . Supervision of high risk pregnancy, antepartum 04/07/2018  . Advanced maternal age in multigravida 04/07/2018  . Prediabetes   . HLD (hyperlipidemia)   . TIA (transient ischemic attack) 07/30/2016  . Acute left-sided weakness 07/30/2016  . Cocaine use 01/16/2016  . Gestational diabetes mellitus, class A1 12/20/2015  . Anemia 09/11/2015  . Right ovarian cyst 08/30/2015  . HTN (hypertension) 01/09/2014    Assessment/Plan:  ELANIE HAMMITT is a 37 y.o. [id number] at [redacted]w[redacted]d here for preterm labor.  #Labor: will manage expectantly #Pain: Epidural in place #FWB: Category II #ID:  GBS negative #MOF: both #MOC:BTL #Circ:  Girl #cHTN: no medication, monitor BP, systolics 120s-130s since admission, UPC normal on 9/13, PEC labs normal. #A1GMD: last CBG 103, CBG Q4 hours in latent labor, Q2 in active.  Mirian Mo, MD  09/18/2018, 4:49 AM  Attestation: I have seen this patient and agree with the resident's documentation. I have examined them separately, and we have discussed the plan of care. I was not present during the admission, but I am caring for this patient today. She is not in active preterm labor.   Cristal Deer. Earlene Plater, DO OB/GYN Fellow

## 2018-09-18 NOTE — Anesthesia Preprocedure Evaluation (Addendum)
Anesthesia Evaluation  Patient identified by MRN, date of birth, ID band Patient awake    Reviewed: Allergy & Precautions, NPO status , Patient's Chart, lab work & pertinent test results  History of Anesthesia Complications Negative for: history of anesthetic complications  Airway Mallampati: II  TM Distance: >3 FB Neck ROM: Full    Dental   Pulmonary neg pulmonary ROS,    breath sounds clear to auscultation       Cardiovascular hypertension, Pt. on medications  Rhythm:Regular Rate:Normal     Neuro/Psych TIAnegative psych ROS   GI/Hepatic GERD  Controlled and Medicated,(+)     substance abuse  cocaine use,   Endo/Other  diabetes, Gestational  Renal/GU negative Renal ROS  negative genitourinary   Musculoskeletal negative musculoskeletal ROS (+)   Abdominal   Peds  Hematology  (+) anemia ,   Anesthesia Other Findings   Reproductive/Obstetrics (+) Pregnancy                            Anesthesia Physical Anesthesia Plan  ASA: III  Anesthesia Plan: Epidural   Post-op Pain Management:    Induction:   PONV Risk Score and Plan: 2 and Treatment may vary due to age or medical condition  Airway Management Planned: Natural Airway  Additional Equipment: None  Intra-op Plan:   Post-operative Plan:   Informed Consent: I have reviewed the patients History and Physical, chart, labs and discussed the procedure including the risks, benefits and alternatives for the proposed anesthesia with the patient or authorized representative who has indicated his/her understanding and acceptance.     Plan Discussed with: Anesthesiologist  Anesthesia Plan Comments: (Labs reviewed. Platelets acceptable, patient not taking any blood thinning medications. Per RN, FHR tracing reported to be stable enough for sitting procedure. Risks and benefits discussed with patient, including PDPH, backache, epidural  hematoma, failed epidural, allergic reaction, and nerve injury. Patient expressed understanding and wished to proceed.)        Anesthesia Quick Evaluation

## 2018-09-18 NOTE — MAU Note (Signed)
Pt states the pain is sharper and stronger.

## 2018-09-18 NOTE — Discharge Summary (Signed)
Antepartum Discharge Summary OB/GYN Faculty Practice   Patient Name: [name] DOB: 04-19-1981 MRN: [id number]  Date of admission: 09/17/2018 Date of discharge: 09/18/2018  Admitting diagnosis: 35WKS CTX Intrauterine pregnancy: [redacted]w[redacted]d     Secondary diagnosis:   Active Problems:   HTN (hypertension)   Right ovarian cyst   Gestational diabetes mellitus, class A1   Cocaine use   Supervision of high risk pregnancy, antepartum   Advanced maternal age in multigravida   History of preterm delivery, currently pregnant in second trimester   History of cocaine use   Threatened preterm labor    Discharge diagnosis: threatened preterm labor                                             Hospital course: [name] is a 37 y.o. [redacted]w[redacted]d who was admitted for threatened preterm labor. She was previously admitted for the same earlier this week on 09/12/18. She presented tot he MAU overnight complaining of contractions. She had taken 3 doses of Procardia with no improvement in contractions. When she presented to the MAU, her cervical exam had changed from prior where internal os used to be 1cm and was 4cm on presentation which changed to 4.5. She received Zofran, nifedipine and terbutaline in MAU. During her prior admission, she received two doses of betamethasone and neuroprotective magnesium. Because of the change in her cervix, she was admitted to L&D and received and epidural for pain control. Her cervical exam was unchanged throughout the morning, and the decision was made to turn off her epidural at 10am. Her contractions had spaced out and were much more irregular. At 2pm, her cervix was again rechecked and had not changed. Fetal monitoring was reassuring throughout. Her blood pressures were well-controlled. Epidural was removed, and the decision was made to discharge patient home with preterm labor return precautions. Patient has follow-up appointment in about 4 days with prenatal  clinic.   Physical exam  Vitals:   09/18/18 1231 09/18/18 1301 09/18/18 1331 09/18/18 1401  BP: 118/75 (!) 103/52 (!) 110/59 (!) 107/51  Pulse: (!) 103 90 96 96  Resp: 20 18 20 18   Temp:  98.3 F (36.8 C)    TempSrc:  Oral    SpO2:      Weight:      Height:       General: well-appearing, NAD, sleepy Abdomen: gravid, non-tender DVT Evaluation: No evidence of DVT seen on physical exam. Labs: Lab Results  Component Value Date   WBC 12.5 (H) 09/18/2018   HGB 11.3 (L) 09/18/2018   HCT 33.1 (L) 09/18/2018   MCV 86.2 09/18/2018   PLT 263 09/18/2018   CMP Latest Ref Rng & Units 09/18/2018  Glucose 70 - 99 mg/dL 846(N)  BUN 6 - 20 mg/dL 10  Creatinine 6.29 - 5.28 mg/dL 4.13  Sodium 244 - 010 mmol/L 135  Potassium 3.5 - 5.1 mmol/L 3.8  Chloride 98 - 111 mmol/L 103  CO2 22 - 32 mmol/L 21(L)  Calcium 8.9 - 10.3 mg/dL 9.7  Total Protein 6.5 - 8.1 g/dL 6.7  Total Bilirubin 0.3 - 1.2 mg/dL 0.5  Alkaline Phos 38 - 126 U/L 77  AST 15 - 41 U/L 28  ALT 0 - 44 U/L 23    Discharge instructions: Per After Visit Summary and "Baby and Me Booklet"  After visit meds:  Allergies  as of 09/18/2018   No Known Allergies     Medication List    TAKE these medications   acetaminophen 325 MG tablet Commonly known as:  TYLENOL Take 650 mg by mouth every 6 (six) hours as needed for mild pain or headache.   calcium carbonate 500 MG chewable tablet Commonly known as:  TUMS - dosed in mg elemental calcium Chew 2 tablets by mouth daily as needed for indigestion or heartburn.   COMFORT FIT MATERNITY SUPP LG Misc 1 Units by Does not apply route daily.   DICLEGIS 10-10 MG Tbec Generic drug:  Doxylamine-Pyridoxine Take 1 tablet by mouth 2 (two) times daily as needed (nausea/vomiting).   NIFEdipine 30 MG 24 hr tablet Commonly known as:  PROCARDIA-XL/ADALAT CC Take 1 tablet (30 mg total) by mouth 2 (two) times daily.   prenatal multivitamin Tabs tablet Take 1 tablet by mouth daily at 12  noon.      Diet: Routine Diet Activity: Advance as tolerated.   Follow-up Appt: Future Appointments  Date Time Provider Department Center  09/22/2018  9:15 AM Constant, Gigi Gin, MD [facility] None   Cristal Deer. Earlene Plater, DO OB/GYN Fellow, Faculty Practice

## 2018-09-18 NOTE — Progress Notes (Signed)
OB/GYN Faculty Practice: Labor Progress Note  Subjective: Patient with epidural in place, still can tell when contractions happening. Otherwise comfortable  Objective: BP (!) 103/52   Pulse 90   Temp 98.3 F (36.8 C) (Oral)   Resp 18   Ht 5\' 3"  (1.6 m)   Wt 83.5 kg   LMP 01/12/2018   SpO2 100%   BMI 32.59 kg/m  Gen: well-appearing, partner in room, lying down  Dilation: 5 Effacement (%): 70 Station: -3 Presentation: Vertex Exam by:: Dr. Earlene Plater and C. Joseph Art, RN  Assessment and Plan: 37 y.o. [phone] [redacted]w[redacted]d here with spontaneous onset of preterm labor. Pregnancy complicated by chronic hypertension, right ovarian cyst, A1GDM, history of cocaine use, history of TIA, HLD, history of preterm delivery, and history of kidney stones.   Threatened Preterm Labor - Has been admitted for threatened preterm labor this pregnancy. Recived BMZ x 2 (9/19-20). Has had some cervical change since that time, has received fluids and procardia to help with preterm contractions. Received 17P this pregnancy. Exam is changed since check about 3 hours prior. Discussed with patient that we do not recommend augmenting given preterm status. Recommended removing epidural, monitoring for several hours, and rechecking. If no change after about 4 hours, will plan to discharge home with preterm labor precautions. No ROM, vaginal bleeding. Reassuring fetal tracing.   Fetal Well-Being: EFW 1823g (74%) at 32w2. Cephalic by sutures.  -- category I strip, irregular contractions every 5-8 minutes -- GBS negative    CHTN: Blood pressure has been well-controlled since admission.   Cristal Deer. Earlene Plater, DO OB/GYN Fellow, Faculty Practice  1:23 PM

## 2018-09-18 NOTE — Anesthesia Procedure Notes (Signed)
Epidural Patient location during procedure: OB Start time: 09/18/2018 4:24 AM End time: 09/18/2018 4:27 AM  Staffing Anesthesiologist: Beryle Lathe, MD Performed: anesthesiologist   Preanesthetic Checklist Completed: patient identified, pre-op evaluation, timeout performed, IV checked, risks and benefits discussed and monitors and equipment checked  Epidural Patient position: sitting Prep: DuraPrep Patient monitoring: continuous pulse ox and blood pressure Approach: midline Location: L2-L3 Injection technique: LOR saline  Needle:  Needle type: Tuohy  Needle gauge: 17 G Needle length: 9 cm Needle insertion depth: 6 cm Catheter size: 19 Gauge Catheter at skin depth: 11 cm Test dose: negative and Other (1% lidocaine)  Assessment Events: blood not aspirated  Additional Notes Patient identified. Risks including, but not limited to, bleeding, infection, nerve damage, paralysis, inadequate analgesia, blood pressure changes, nausea, vomiting, allergic reaction, postpartum back pain, itching, and headache were discussed. Patient expressed understanding and wished to proceed. Sterile prep and drape, including hand hygiene, mask, and sterile gloves were used. The patient was positioned and the spine was prepped. The skin was anesthetized with lidocaine. No paraesthesia or other complication noted. The patient did not experience any signs of intravascular injection such as tinnitus or metallic taste in mouth, nor signs of intrathecal spread such as rapid motor block. Please see nursing notes for vital signs. The patient tolerated the procedure well.   Leslye Peer, MDReason for block:procedure for pain

## 2018-09-20 ENCOUNTER — Other Ambulatory Visit: Payer: Self-pay

## 2018-09-20 ENCOUNTER — Encounter (HOSPITAL_COMMUNITY): Payer: Self-pay

## 2018-09-20 ENCOUNTER — Inpatient Hospital Stay (HOSPITAL_COMMUNITY)
Admission: AD | Admit: 2018-09-20 | Discharge: 2018-09-21 | Disposition: A | Discharge status: Home / Self Care | Payer: Medicaid Other | Source: Ambulatory Visit | Attending: Obstetrics & Gynecology | Admitting: Obstetrics & Gynecology

## 2018-09-20 DIAGNOSIS — F1491 Cocaine use, unspecified, in remission: Secondary | ICD-10-CM

## 2018-09-20 DIAGNOSIS — N898 Other specified noninflammatory disorders of vagina: Secondary | ICD-10-CM

## 2018-09-20 DIAGNOSIS — O4703 False labor before 37 completed weeks of gestation, third trimester: Secondary | ICD-10-CM

## 2018-09-20 DIAGNOSIS — Z3A34 34 weeks gestation of pregnancy: Secondary | ICD-10-CM | POA: Insufficient documentation

## 2018-09-20 DIAGNOSIS — O4700 False labor before 37 completed weeks of gestation, unspecified trimester: Secondary | ICD-10-CM

## 2018-09-20 DIAGNOSIS — O479 False labor, unspecified: Secondary | ICD-10-CM

## 2018-09-20 DIAGNOSIS — Z87898 Personal history of other specified conditions: Secondary | ICD-10-CM

## 2018-09-20 NOTE — MAU Note (Signed)
Pt. Reports that she has taken procardia yesterday and today when feeling contractions. Pt. Reports worsening of ctx today that are 2-4 min apart. She states +FM and bloody show.

## 2018-09-21 ENCOUNTER — Ambulatory Visit (INDEPENDENT_AMBULATORY_CARE_PROVIDER_SITE_OTHER): Payer: Medicaid Other | Admitting: Advanced Practice Midwife

## 2018-09-21 ENCOUNTER — Inpatient Hospital Stay (EMERGENCY_DEPARTMENT_HOSPITAL)
Admission: AD | Admit: 2018-09-21 | Discharge: 2018-09-21 | Disposition: A | Discharge status: Home / Self Care | Payer: Medicaid Other | Source: Ambulatory Visit | Attending: Family Medicine | Admitting: Family Medicine

## 2018-09-21 ENCOUNTER — Encounter (HOSPITAL_COMMUNITY): Payer: Self-pay | Admitting: *Deleted

## 2018-09-21 VITALS — BP 142/86 | HR 97 | Wt 180.0 lb

## 2018-09-21 DIAGNOSIS — O4703 False labor before 37 completed weeks of gestation, third trimester: Secondary | ICD-10-CM | POA: Diagnosis present

## 2018-09-21 DIAGNOSIS — O133 Gestational [pregnancy-induced] hypertension without significant proteinuria, third trimester: Secondary | ICD-10-CM | POA: Insufficient documentation

## 2018-09-21 DIAGNOSIS — Z3A34 34 weeks gestation of pregnancy: Secondary | ICD-10-CM

## 2018-09-21 DIAGNOSIS — O163 Unspecified maternal hypertension, third trimester: Secondary | ICD-10-CM

## 2018-09-21 DIAGNOSIS — G44209 Tension-type headache, unspecified, not intractable: Secondary | ICD-10-CM

## 2018-09-21 DIAGNOSIS — O26893 Other specified pregnancy related conditions, third trimester: Secondary | ICD-10-CM | POA: Insufficient documentation

## 2018-09-21 DIAGNOSIS — Z3689 Encounter for other specified antenatal screening: Secondary | ICD-10-CM | POA: Diagnosis not present

## 2018-09-21 DIAGNOSIS — O0990 Supervision of high risk pregnancy, unspecified, unspecified trimester: Secondary | ICD-10-CM

## 2018-09-21 DIAGNOSIS — O24410 Gestational diabetes mellitus in pregnancy, diet controlled: Secondary | ICD-10-CM

## 2018-09-21 DIAGNOSIS — O368130 Decreased fetal movements, third trimester, not applicable or unspecified: Secondary | ICD-10-CM

## 2018-09-21 DIAGNOSIS — F1491 Cocaine use, unspecified, in remission: Secondary | ICD-10-CM

## 2018-09-21 DIAGNOSIS — O4700 False labor before 37 completed weeks of gestation, unspecified trimester: Secondary | ICD-10-CM

## 2018-09-21 DIAGNOSIS — G44201 Tension-type headache, unspecified, intractable: Secondary | ICD-10-CM

## 2018-09-21 DIAGNOSIS — O479 False labor, unspecified: Secondary | ICD-10-CM

## 2018-09-21 DIAGNOSIS — O288 Other abnormal findings on antenatal screening of mother: Secondary | ICD-10-CM

## 2018-09-21 DIAGNOSIS — Z87898 Personal history of other specified conditions: Secondary | ICD-10-CM

## 2018-09-21 HISTORY — DX: Gestational (pregnancy-induced) hypertension without significant proteinuria, third trimester: O13.3

## 2018-09-21 LAB — COMPREHENSIVE METABOLIC PANEL
ALT: 24 U/L (ref 0–44)
AST: 27 U/L (ref 15–41)
Albumin: 3 g/dL — ABNORMAL LOW (ref 3.5–5.0)
Alkaline Phosphatase: 86 U/L (ref 38–126)
Anion gap: 10 (ref 5–15)
BUN: 6 mg/dL (ref 6–20)
CO2: 22 mmol/L (ref 22–32)
Calcium: 10.5 mg/dL — ABNORMAL HIGH (ref 8.9–10.3)
Chloride: 104 mmol/L (ref 98–111)
Creatinine, Ser: 0.65 mg/dL (ref 0.44–1.00)
GFR calc Af Amer: >60 mL/min (ref >60)
GFR calc non Af Amer: >60 mL/min (ref >60)
Glucose, Bld: 93 mg/dL (ref 70–99)
Potassium: 3.8 mmol/L (ref 3.5–5.1)
Sodium: 136 mmol/L (ref 135–145)
Total Bilirubin: 0.4 mg/dL (ref 0.3–1.2)
Total Protein: 6.9 g/dL (ref 6.5–8.1)

## 2018-09-21 LAB — URINALYSIS, ROUTINE W REFLEX MICROSCOPIC
Bilirubin Urine: NEGATIVE
Glucose, UA: NEGATIVE mg/dL
Ketones, ur: NEGATIVE mg/dL
Nitrite: NEGATIVE
Protein, ur: NEGATIVE mg/dL
Specific Gravity, Urine: 1.008 (ref 1.005–1.030)
pH: 8 (ref 5.0–8.0)

## 2018-09-21 LAB — CBC
HCT: 31.9 % — ABNORMAL LOW (ref 36.0–46.0)
Hemoglobin: 10.3 g/dL — ABNORMAL LOW (ref 12.0–15.0)
MCH: 28.7 pg (ref 26.0–34.0)
MCHC: 32.3 g/dL (ref 30.0–36.0)
MCV: 88.9 fL (ref 78.0–100.0)
Platelets: 218 10*3/uL (ref 150–400)
RBC: 3.59 MIL/uL — ABNORMAL LOW (ref 3.87–5.11)
RDW: 15.3 % (ref 11.5–15.5)
WBC: 10.6 10*3/uL — ABNORMAL HIGH (ref 4.0–10.5)

## 2018-09-21 LAB — PROTEIN / CREATININE RATIO, URINE
Creatinine, Urine: 46 mg/dL
Protein Creatinine Ratio: 0.17 mg/mg{Cre} — ABNORMAL HIGH (ref 0.00–0.15)
Total Protein, Urine: 8 mg/dL

## 2018-09-21 MED ORDER — METOCLOPRAMIDE HCL 5 MG/ML IJ SOLN
10.0000 mg | Freq: Once | INTRAMUSCULAR | Status: AC
  Administered 2018-09-21: 10 mg via INTRAVENOUS
  Filled 2018-09-21: qty 2

## 2018-09-21 MED ORDER — LACTATED RINGERS IV BOLUS
1000.0000 mL | Freq: Once | INTRAVENOUS | Status: AC
  Administered 2018-09-21: 1000 mL via INTRAVENOUS

## 2018-09-21 MED ORDER — DIPHENHYDRAMINE HCL 50 MG/ML IJ SOLN
25.0000 mg | Freq: Once | INTRAMUSCULAR | Status: AC
  Administered 2018-09-21: 25 mg via INTRAVENOUS
  Filled 2018-09-21: qty 1

## 2018-09-21 MED ORDER — NIFEDIPINE 10 MG PO CAPS
10.0000 mg | ORAL_CAPSULE | ORAL | Status: AC | PRN
  Administered 2018-09-21 (×3): 10 mg via ORAL
  Filled 2018-09-21 (×3): qty 1

## 2018-09-21 MED ORDER — DEXAMETHASONE SODIUM PHOSPHATE 10 MG/ML IJ SOLN
10.0000 mg | Freq: Once | INTRAMUSCULAR | Status: AC
  Administered 2018-09-21: 10 mg via INTRAVENOUS
  Filled 2018-09-21: qty 1

## 2018-09-21 MED ORDER — SODIUM CHLORIDE 0.9 % IV SOLN
INTRAVENOUS | Status: DC
  Administered 2018-09-21: 12:00:00 via INTRAVENOUS

## 2018-09-21 MED ORDER — TERBUTALINE SULFATE 1 MG/ML IJ SOLN
0.2500 mg | Freq: Once | INTRAMUSCULAR | Status: AC
  Administered 2018-09-21: 0.25 mg via SUBCUTANEOUS
  Filled 2018-09-21: qty 1

## 2018-09-21 NOTE — Progress Notes (Signed)
Patient reports that fetal movement has decreased since 4am, states that she is having irregular contractions, and had some spotting yesterday. Administered 17-p and pt tolerated well

## 2018-09-21 NOTE — MAU Note (Addendum)
PT sent from office for increase in blood pressure and non reactive NST. Pt reports headache. Report by Sharen Counter CNM pt has been 5 cms for a couple of weeds

## 2018-09-21 NOTE — Patient Instructions (Signed)

## 2018-09-21 NOTE — MAU Provider Note (Signed)
History     CSN: [id number] Arrival date and time: 09/20/18 2322  Chief Complaint  Patient presents with  . Contractions   HPI 37yo [id number] at [redacted]w[redacted]d who presents to MAU complaining of contractions. She has had multiple prior presentations for the same and two admissions. She took her Procardia XL twice today but reports continued contractions. She states she tried to have some fluids around 8pm when the contraction frequency increased, but she is unable to say how much she had the rest of the day. She states she is just ready for this pregnancy to be over.   She was most recently admitted two days ago to L&D because of threatened preterm labor. She had an epidural placed but her cervical exam was unchanged after several hours, so it was removed and she was discharged home with return precautions. She was admitted about 10 days ago and received Mg++ for neuroprophylaxis and two doses of steroids (9/19-9/20).   She reports normal fetal movement. She does report some mucous with blood-tinge. She denies any leakage of fluids.   OB History    Gravida  6   Para  5   Term  4   Preterm  1   AB      Living  5     SAB      TAB      Ectopic      Multiple  0   Live Births  5           Past Medical History:  Diagnosis Date  . Diabetes mellitus without complication (HCC)   . GERD (gastroesophageal reflux disease)   . History of cocaine use   . Hypertension   . Kidney stones   . Ovarian cyst   . Pregnancy induced hypertension     Past Surgical History:  Procedure Laterality Date  . APPENDECTOMY    . HEMORRHOID SURGERY N/A 01/19/2016   Procedure: EXTENSIVE HEMORRHOIDECTOMY;  Surgeon: Franky Macho, MD;  Location: [facility];  Service: General;  Laterality: N/A;  . kidney stents    . LITHOTRIPSY      Family History  Problem Relation Age of Onset  . Cancer Mother        breast  . Diabetes Maternal Grandmother     Social History   Tobacco Use  . Smoking status:  Never Smoker  . Smokeless tobacco: Never Used  Substance Use Topics  . Alcohol use: No  . Drug use: No    Comment: Hx Cocaine use    Allergies: No Known Allergies  Facility-Administered Medications Prior to Admission  Medication Dose Route Frequency Provider Last Rate Last Dose  . HYDROXYprogesterone Caproate SOAJ 275 mg  275 mg Subcutaneous Weekly Denney, Rachelle A, CNM   275 mg at 09/15/18 4098   Medications Prior to Admission  Medication Sig Dispense Refill Last Dose  . NIFEdipine (PROCARDIA-XL/ADALAT CC) 30 MG 24 hr tablet Take 1 tablet (30 mg total) by mouth 2 (two) times daily. 60 tablet 1 09/20/2018 at Unknown time  . Prenatal Vit-Fe Fumarate-FA (PRENATAL MULTIVITAMIN) TABS tablet Take 1 tablet by mouth daily at 12 noon.   09/20/2018 at Unknown time  . acetaminophen (TYLENOL) 325 MG tablet Take 650 mg by mouth every 6 (six) hours as needed for mild pain or headache.    09/17/2018 at Unknown time  . calcium carbonate (TUMS - DOSED IN MG ELEMENTAL CALCIUM) 500 MG chewable tablet Chew 2 tablets by mouth daily as needed for indigestion or  heartburn.   Past Week at Unknown time  . Doxylamine-Pyridoxine (DICLEGIS) 10-10 MG TBEC Take 1 tablet by mouth 2 (two) times daily as needed (nausea/vomiting).   Past Week at Unknown time  . Elastic Bandages & Supports (COMFORT FIT MATERNITY SUPP LG) MISC 1 Units by Does not apply route daily. 1 each 0 Taking    Review of Systems  Constitutional: Negative for activity change and appetite change.  Respiratory: Negative for shortness of breath.   Cardiovascular: Negative for chest pain and leg swelling.  Gastrointestinal: Negative for nausea and vomiting.  Musculoskeletal: Positive for back pain.  Neurological: Negative for headaches.  Psychiatric/Behavioral: Positive for sleep disturbance.   Physical Exam   Blood pressure 135/74, pulse 94, temperature 98.5 F (36.9 C), temperature source Oral, resp. rate 18, height 5\' 3"  (1.6 m), weight 82.2 kg,  last menstrual period 01/12/2018.  Physical Exam  Nursing note and vitals reviewed. Constitutional: She is oriented to person, place, and time. She appears well-developed and well-nourished. No distress.  HENT:  Head: Normocephalic and atraumatic.  Eyes: Conjunctivae and EOM are normal.  Cardiovascular: Normal rate and intact distal pulses.  Respiratory: No respiratory distress.  GI:  Gravid, non-tender  Genitourinary:  Genitourinary Comments: Normal-appearing vaginal walls, scant white/clear discharge, no pooling, no vaginal bleeding  Neurological: She is alert and oriented to person, place, and time.  Psychiatric: She has a normal mood and affect. Her behavior is normal.   MAU Course  Procedures  MDM -- SVE unchanged from discharge two days ago, no signs of ROM, fern negative  -- will give IR procardia x 3 prn and IVF 1L bolus  -- plan to recheck in about 2 hours   -- REACTIVE CST: 150s/mod/+a/-d; ctx every 3-5 minutes -- consulted with Dr. Despina Hidden - will give terbutaline 0.25 subQ and discharge home with return precautions    Assessment and Plan  37yo [id number] at [redacted]w[redacted]d who presented to MAU with threatened preterm labor. Her cervical exam was unchanged after about 2.5 hours. Her contractions spaced with administration of nifedipine and terbutaline. No evidence of vaginal bleeding, fern test negative. Has already received neuroprotective magnesium and betamethasone x 2 about a week ago. She was discharge home with return precautions. Patient and family in agreement with plan, all questions answered.  Tamera Stands, DO 09/21/2018, 1:04 AM

## 2018-09-21 NOTE — Progress Notes (Signed)
   PRENATAL VISIT NOTE  Subjective:  [name] is a 37 y.o. [id number] at [redacted]w[redacted]d being seen today for ongoing prenatal care.  She is currently monitored for the following issues for this high-risk pregnancy and has HTN (hypertension); Right ovarian cyst; Anemia; Gestational diabetes mellitus, class A1; Cocaine use; TIA (transient ischemic attack); Acute left-sided weakness; Prediabetes; HLD (hyperlipidemia); Supervision of high risk pregnancy, antepartum; Advanced maternal age in multigravida; History of preterm delivery, currently pregnant in second trimester; Kidney stones; History of cocaine use; Preterm contractions; Unwanted fertility; and Threatened preterm labor on their problem list.  Patient reports contractions unchanged from MAU visit yesterday, vaginal spotting, decreased fetal movement today.  Contractions: Irregular. Vag. Bleeding: Scant.  Movement: (!) Decreased. Denies leaking of fluid.   The following portions of the patient's history were reviewed and updated as appropriate: allergies, current medications, past family history, past medical history, past social history, past surgical history and problem list. Problem list updated.  Objective:   Vitals:   09/21/18 0929  BP: (!) 142/86  Pulse: 97  Weight: 81.6 kg    Fetal Status: Fetal Heart Rate (bpm): NST   Movement: (!) Decreased     General:  Alert, oriented and cooperative. Patient is in no acute distress.  Skin: Skin is warm and dry. No rash noted.   Cardiovascular: Normal heart rate noted  Respiratory: Normal respiratory effort, no problems with respiration noted  Abdomen: Soft, gravid, appropriate for gestational age.  Pain/Pressure: Present     Pelvic: Cervical exam performed Dilation: 5 Effacement (%): 70 Station: -2  Extremities: Normal range of motion.  Edema: None  Mental Status: Normal mood and affect. Normal behavior. Normal judgment and thought content.   Assessment and Plan:  Pregnancy:  [id number] at [redacted]w[redacted]d  1. Supervision of high risk pregnancy, antepartum   2. Hypertension during pregnancy in third trimester, unspecified hypertension in pregnancy type --Preeclampsia last pregnancy, no diagnosis of HTN this pregnancy  3. Preterm uterine contractions --Multiple MAU visits for preterm contractions, cervix is 5 cm, unchanged from previous exams.  4. Decreased fetal movements in third trimester, single or unspecified fetus --Pt feeling movement today, but less than usual  - Fetal nonstress test; Future  5. NST (non-stress test) nonreactive --NST not reactive in 20 minutes, with baseline 135, moderate variability, decel x 1, variable during contraction.  10 x 10 accels but no clear 15 x 15 noted.   6. Gestational diabetes mellitus, class A1 --Diet controlled  Preterm labor symptoms and general obstetric precautions including but not limited to vaginal bleeding, contractions, leaking of fluid and fetal movement were reviewed in detail with the patient. Please refer to After Visit Summary for other counseling recommendations.  Return in about 1 week (around 09/28/2018).  No future appointments.  Sharen Counter, CNM

## 2018-09-21 NOTE — MAU Note (Signed)
Urine in lab 

## 2018-09-21 NOTE — MAU Provider Note (Signed)
History     CSN: [id number]  Arrival date and time: 09/21/18 1033   First Provider Initiated Contact with Patient 09/21/18 1115      Chief Complaint  Patient presents with  . non reactive nst  . Hypertension   [name] is a 37 y.o. G6P5 at [redacted]w[redacted]d who presents to MAU being sent over from the office for hypertension and NRNST. She reports new onset of HTN today in the office, reports HTN is associated with HA and blurred vision in right eye. HA started occurring this morning prior to prenatal appointment, gradually worsened throughout appointment and rates HA 9/10 on arrival to MAU. Has taking 1,000mg  of Tylenol this morning for HA with no relief. She reports vision changes started after her HA. She denies hx of hypertension during this pregnancy, was seen in MAU earlier this morning for contractions and noted to be 5cm which she has been for the past couple of weeks. She reports continued contractions but denies contractions being more intense or change in frequency as when she was seen this morning. She denies vaginal bleeding, vaginal discharge or LOF. +FM. Pregnancy is complicated by GDM and hx of PTD which she is on 17P, received BMZ 9/19 and 9/20.    OB History    Gravida  6   Para  5   Term  4   Preterm  1   AB      Living  5     SAB      TAB      Ectopic      Multiple  0   Live Births  5           Past Medical History:  Diagnosis Date  . Diabetes mellitus without complication (HCC)   . GERD (gastroesophageal reflux disease)   . History of cocaine use   . Hypertension   . Kidney stones   . Ovarian cyst   . Pregnancy induced hypertension     Past Surgical History:  Procedure Laterality Date  . APPENDECTOMY    . HEMORRHOID SURGERY N/A 01/19/2016   Procedure: EXTENSIVE HEMORRHOIDECTOMY;  Surgeon: Franky Macho, MD;  Location: [facility];  Service: General;  Laterality: N/A;  . kidney stents    . LITHOTRIPSY      Family History  Problem  Relation Age of Onset  . Cancer Mother        breast  . Diabetes Maternal Grandmother     Social History   Tobacco Use  . Smoking status: Never Smoker  . Smokeless tobacco: Never Used  Substance Use Topics  . Alcohol use: No  . Drug use: No    Comment: Hx Cocaine use    Allergies: No Known Allergies  Facility-Administered Medications Prior to Admission  Medication Dose Route Frequency Provider Last Rate Last Dose  . HYDROXYprogesterone Caproate SOAJ 275 mg  275 mg Subcutaneous Weekly Denney, Rachelle A, CNM   275 mg at 09/21/18 1324   Medications Prior to Admission  Medication Sig Dispense Refill Last Dose  . acetaminophen (TYLENOL) 325 MG tablet Take 650 mg by mouth every 6 (six) hours as needed for mild pain or headache.    09/21/2018 at Unknown time  . calcium carbonate (TUMS - DOSED IN MG ELEMENTAL CALCIUM) 500 MG chewable tablet Chew 2 tablets by mouth daily as needed for indigestion or heartburn.   09/21/2018 at Unknown time  . Doxylamine-Pyridoxine (DICLEGIS) 10-10 MG TBEC Take 1 tablet by mouth 2 (two) times  daily as needed (nausea/vomiting).   09/20/2018 at Unknown time  . NIFEdipine (PROCARDIA-XL/ADALAT CC) 30 MG 24 hr tablet Take 1 tablet (30 mg total) by mouth 2 (two) times daily. 60 tablet 1 09/20/2018 at Unknown time  . Prenatal Vit-Fe Fumarate-FA (PRENATAL MULTIVITAMIN) TABS tablet Take 1 tablet by mouth daily at 12 noon.   09/20/2018 at Unknown time  . Elastic Bandages & Supports (COMFORT FIT MATERNITY SUPP LG) MISC 1 Units by Does not apply route daily. 1 each 0 Taking    Review of Systems  Constitutional:       Hypertension  Eyes: Positive for visual disturbance.  Respiratory: Negative.   Cardiovascular: Negative.   Gastrointestinal: Positive for abdominal pain. Negative for constipation, diarrhea, nausea and vomiting.       Contractions   Genitourinary: Negative.   Neurological: Positive for headaches. Negative for dizziness, syncope, weakness and  light-headedness.   Physical Exam   Blood pressure 126/65, pulse 81, temperature 98.4 F (36.9 C), resp. rate 18, last menstrual period 01/12/2018.  Physical Exam  Nursing note and vitals reviewed. Constitutional: She is oriented to person, place, and time. She appears well-developed and well-nourished.  Cardiovascular: Normal rate, regular rhythm and normal heart sounds.  Respiratory: Effort normal and breath sounds normal. No respiratory distress. She has no wheezes.  GI: Soft.  Gravid appropriate for gestational age  Musculoskeletal: Normal range of motion. She exhibits no edema.  Neurological: She is alert and oriented to person, place, and time. She displays normal reflexes. She exhibits normal muscle tone.  Skin: Skin is warm and dry.  Psychiatric: She has a normal mood and affect. Her behavior is normal. Thought content normal.   Cervical exam deferred due to recent examination in the office this morning.   FHR: 135/ moderate variability/ +accels/ no decelerations  Toco: 3-5 minutes/ mild by palpation   MAU Course  Procedures  MDM Orders Placed This Encounter  Procedures  . Urine Culture  . CBC  . Comprehensive metabolic panel  . Protein / creatinine ratio, urine  . Urinalysis, Routine w reflex microscopic  . Insert peripheral IV   Labs reviewed:  Results for orders placed or performed during the hospital encounter of 09/21/18 (from the past 24 hour(s))  Protein / creatinine ratio, urine     Status: Abnormal   Collection Time: 09/21/18 10:48 AM  Result Value Ref Range   Creatinine, Urine 46.00 mg/dL   Total Protein, Urine 8 mg/dL   Protein Creatinine Ratio 0.17 (H) 0.00 - 0.15 mg/mg[Cre]  Urinalysis, Routine w reflex microscopic     Status: Abnormal   Collection Time: 09/21/18 10:48 AM  Result Value Ref Range   Color, Urine YELLOW YELLOW   APPearance HAZY (A) CLEAR   Specific Gravity, Urine 1.008 1.005 - 1.030   pH 8.0 5.0 - 8.0   Glucose, UA NEGATIVE  NEGATIVE mg/dL   Hgb urine dipstick MODERATE (A) NEGATIVE   Bilirubin Urine NEGATIVE NEGATIVE   Ketones, ur NEGATIVE NEGATIVE mg/dL   Protein, ur NEGATIVE NEGATIVE mg/dL   Nitrite NEGATIVE NEGATIVE   Leukocytes, UA MODERATE (A) NEGATIVE   RBC / HPF 11-20 0 - 5 RBC/hpf   WBC, UA 6-10 0 - 5 WBC/hpf   Bacteria, UA RARE (A) NONE SEEN   Squamous Epithelial / LPF 6-10 0 - 5   Mucus PRESENT   CBC     Status: Abnormal   Collection Time: 09/21/18 11:29 AM  Result Value Ref Range   WBC 10.6 (H)  4.0 - 10.5 K/uL   RBC 3.59 (L) 3.87 - 5.11 MIL/uL   Hemoglobin 10.3 (L) 12.0 - 15.0 g/dL   HCT 84.6 (L) 96.2 - 95.2 %   MCV 88.9 78.0 - 100.0 fL   MCH 28.7 26.0 - 34.0 pg   MCHC 32.3 30.0 - 36.0 g/dL   RDW 84.1 32.4 - 40.1 %   Platelets 218 150 - 400 K/uL  Comprehensive metabolic panel     Status: Abnormal   Collection Time: 09/21/18 11:29 AM  Result Value Ref Range   Sodium 136 135 - 145 mmol/L   Potassium 3.8 3.5 - 5.1 mmol/L   Chloride 104 98 - 111 mmol/L   CO2 22 22 - 32 mmol/L   Glucose, Bld 93 70 - 99 mg/dL   BUN 6 6 - 20 mg/dL   Creatinine, Ser 0.27 0.44 - 1.00 mg/dL   Calcium 25.3 (H) 8.9 - 10.3 mg/dL   Total Protein 6.9 6.5 - 8.1 g/dL   Albumin 3.0 (L) 3.5 - 5.0 g/dL   AST 27 15 - 41 U/L   ALT 24 0 - 44 U/L   Alkaline Phosphatase 86 38 - 126 U/L   Total Bilirubin 0.4 0.3 - 1.2 mg/dL   GFR calc non Af Amer >60 >60 mL/min   GFR calc Af Amer >60 >60 mL/min   Anion gap 10 5 - 15   PEC labs WNL  Treatments in MAU included HA cocktail (bendadryl 25mg  IV, reglan 10mg  IV and decadron 10mg  IV) , patient reports HA and vision changes is completely relieved after treatment. Patient reports contractions have spaced.   Consults with Dr Shawnie Pons with assessment and management. Okay to discharge home with close follow up and initiation of antenatal testing. Precautions reviewed and reasons to return to MAU.   Pt stable at time of discharge.   Assessment and Plan   1. Gestational  hypertension, third trimester   2. [redacted] weeks gestation of pregnancy   3. Acute intractable tension-type headache   4. NST (non-stress test) reactive    Discharge home  Follow up for weekly antenatal testing- message sent to [facility] for scheduling  Discussed reasons to return to MAU  PEC precautions and PTL precautions reviewed  Continue Procardia and 17P as scheduled   Allergies as of 09/21/2018   No Known Allergies     Medication List    TAKE these medications   acetaminophen 325 MG tablet Commonly known as:  TYLENOL Take 650 mg by mouth every 6 (six) hours as needed for mild pain or headache.   calcium carbonate 500 MG chewable tablet Commonly known as:  TUMS - dosed in mg elemental calcium Chew 2 tablets by mouth daily as needed for indigestion or heartburn.   COMFORT FIT MATERNITY SUPP LG Misc 1 Units by Does not apply route daily.   DICLEGIS 10-10 MG Tbec Generic drug:  Doxylamine-Pyridoxine Take 1 tablet by mouth 2 (two) times daily as needed (nausea/vomiting).   NIFEdipine 30 MG 24 hr tablet Commonly known as:  PROCARDIA-XL/ADALAT CC Take 1 tablet (30 mg total) by mouth 2 (two) times daily.   prenatal multivitamin Tabs tablet Take 1 tablet by mouth daily at 12 noon.       Sharyon Cable CNM 09/21/2018, 1:45 PM

## 2018-09-22 ENCOUNTER — Encounter: Payer: Medicaid Other | Admitting: Obstetrics and Gynecology

## 2018-09-22 LAB — URINE CULTURE

## 2018-09-22 NOTE — Anesthesia Postprocedure Evaluation (Signed)
Anesthesia Post Note  Patient: [name]  Procedure(s) Performed: AN AD HOC LABOR EPIDURAL     Anesthesia Type: Epidural Anesthetic complications: no    Last Vitals: There were no vitals filed for this visit.  Last Pain: There were no vitals filed for this visit.               Lucretia Kern

## 2018-09-24 ENCOUNTER — Inpatient Hospital Stay (HOSPITAL_COMMUNITY)
Admission: AD | Admit: 2018-09-24 | Discharge: 2018-09-24 | DRG: 831 | Disposition: A | Discharge status: Home / Self Care | Payer: Medicaid Other | Attending: Obstetrics & Gynecology | Admitting: Obstetrics & Gynecology

## 2018-09-24 ENCOUNTER — Inpatient Hospital Stay (HOSPITAL_COMMUNITY): Payer: Medicaid Other | Admitting: Anesthesiology

## 2018-09-24 ENCOUNTER — Inpatient Hospital Stay (HOSPITAL_BASED_OUTPATIENT_CLINIC_OR_DEPARTMENT_OTHER): Payer: Medicaid Other

## 2018-09-24 ENCOUNTER — Encounter (HOSPITAL_COMMUNITY): Payer: Self-pay

## 2018-09-24 ENCOUNTER — Other Ambulatory Visit: Payer: Medicaid Other

## 2018-09-24 ENCOUNTER — Encounter: Payer: Medicaid Other | Admitting: Obstetrics and Gynecology

## 2018-09-24 DIAGNOSIS — O24419 Gestational diabetes mellitus in pregnancy, unspecified control: Secondary | ICD-10-CM | POA: Diagnosis not present

## 2018-09-24 DIAGNOSIS — O09213 Supervision of pregnancy with history of pre-term labor, third trimester: Secondary | ICD-10-CM

## 2018-09-24 DIAGNOSIS — O133 Gestational [pregnancy-induced] hypertension without significant proteinuria, third trimester: Secondary | ICD-10-CM | POA: Diagnosis present

## 2018-09-24 DIAGNOSIS — Z3A34 34 weeks gestation of pregnancy: Secondary | ICD-10-CM | POA: Diagnosis not present

## 2018-09-24 DIAGNOSIS — D649 Anemia, unspecified: Secondary | ICD-10-CM | POA: Diagnosis present

## 2018-09-24 DIAGNOSIS — O09293 Supervision of pregnancy with other poor reproductive or obstetric history, third trimester: Secondary | ICD-10-CM

## 2018-09-24 DIAGNOSIS — O99013 Anemia complicating pregnancy, third trimester: Secondary | ICD-10-CM | POA: Diagnosis present

## 2018-09-24 DIAGNOSIS — O42111 Preterm premature rupture of membranes, onset of labor more than 24 hours following rupture, first trimester: Secondary | ICD-10-CM

## 2018-09-24 DIAGNOSIS — O42913 Preterm premature rupture of membranes, unspecified as to length of time between rupture and onset of labor, third trimester: Principal | ICD-10-CM | POA: Diagnosis present

## 2018-09-24 DIAGNOSIS — O09523 Supervision of elderly multigravida, third trimester: Secondary | ICD-10-CM | POA: Diagnosis not present

## 2018-09-24 LAB — TYPE AND SCREEN
ABO/RH(D): O POS
ANTIBODY SCREEN: NEGATIVE

## 2018-09-24 LAB — CBC
HCT: 32.1 % — ABNORMAL LOW (ref 36.0–46.0)
Hemoglobin: 10.7 g/dL — ABNORMAL LOW (ref 12.0–15.0)
MCH: 29.1 pg (ref 26.0–34.0)
MCHC: 33.3 g/dL (ref 30.0–36.0)
MCV: 87.2 fL (ref 78.0–100.0)
PLATELETS: 235 10*3/uL (ref 150–400)
RBC: 3.68 MIL/uL — ABNORMAL LOW (ref 3.87–5.11)
RDW: 15.1 % (ref 11.5–15.5)
WBC: 11.9 10*3/uL — AB (ref 4.0–10.5)

## 2018-09-24 LAB — PROTEIN / CREATININE RATIO, URINE
Creatinine, Urine: 90 mg/dL
Protein Creatinine Ratio: 0.19 mg/mg{Cre} — ABNORMAL HIGH (ref 0.00–0.15)
Total Protein, Urine: 17 mg/dL

## 2018-09-24 LAB — URINALYSIS, ROUTINE W REFLEX MICROSCOPIC
BILIRUBIN URINE: NEGATIVE
GLUCOSE, UA: NEGATIVE mg/dL
HGB URINE DIPSTICK: NEGATIVE
Ketones, ur: NEGATIVE mg/dL
NITRITE: NEGATIVE
PH: 8 (ref 5.0–8.0)
Protein, ur: NEGATIVE mg/dL
Specific Gravity, Urine: 1.01 (ref 1.005–1.030)
WBC, UA: >50 WBC/hpf — ABNORMAL HIGH (ref 0–5)

## 2018-09-24 LAB — COMPREHENSIVE METABOLIC PANEL
ALBUMIN: 2.7 g/dL — AB (ref 3.5–5.0)
ALK PHOS: 70 U/L (ref 38–126)
ALT: 21 U/L (ref 0–44)
AST: 24 U/L (ref 15–41)
Anion gap: 9 (ref 5–15)
BILIRUBIN TOTAL: 0.3 mg/dL (ref 0.3–1.2)
BUN: 8 mg/dL (ref 6–20)
CALCIUM: 9.3 mg/dL (ref 8.9–10.3)
CO2: 22 mmol/L (ref 22–32)
Chloride: 105 mmol/L (ref 98–111)
Creatinine, Ser: 0.66 mg/dL (ref 0.44–1.00)
GFR calc Af Amer: >60 mL/min (ref >60)
GLUCOSE: 89 mg/dL (ref 70–99)
Potassium: 3.5 mmol/L (ref 3.5–5.1)
Sodium: 136 mmol/L (ref 135–145)
TOTAL PROTEIN: 6.3 g/dL — AB (ref 6.5–8.1)

## 2018-09-24 LAB — POCT FERN TEST: POCT Fern Test: NEGATIVE — NL

## 2018-09-24 LAB — AMNISURE RUPTURE OF MEMBRANE (ROM) NOT AT ARMC: Amnisure ROM: POSITIVE

## 2018-09-24 LAB — RPR: RPR Ser Ql: NONREACTIVE

## 2018-09-24 LAB — GLUCOSE, CAPILLARY: Glucose-Capillary: 107 mg/dL — ABNORMAL HIGH (ref 70–99)

## 2018-09-24 MED ORDER — PHENYLEPHRINE 40 MCG/ML (10ML) SYRINGE FOR IV PUSH (FOR BLOOD PRESSURE SUPPORT): 80.0000 ug | PREFILLED_SYRINGE | INTRAVENOUS | Status: DC | PRN

## 2018-09-24 MED ORDER — PHENYLEPHRINE 40 MCG/ML (10ML) SYRINGE FOR IV PUSH (FOR BLOOD PRESSURE SUPPORT)
PREFILLED_SYRINGE | INTRAVENOUS | Status: AC
  Filled 2018-09-24: qty 10

## 2018-09-24 MED ORDER — TERBUTALINE SULFATE 1 MG/ML IJ SOLN
0.2500 mg | Freq: Once | INTRAMUSCULAR | Status: AC
  Administered 2018-09-24: 0.25 mg via SUBCUTANEOUS
  Filled 2018-09-24: qty 1

## 2018-09-24 MED ORDER — EPHEDRINE 5 MG/ML INJ: 10.0000 mg | INTRAVENOUS | Status: DC | PRN

## 2018-09-24 MED ORDER — SOD CITRATE-CITRIC ACID 500-334 MG/5ML PO SOLN
30.0000 mL | ORAL | Status: DC | PRN
  Administered 2018-09-24: 30 mL via ORAL
  Filled 2018-09-24: qty 15

## 2018-09-24 MED ORDER — FENTANYL 2.5 MCG/ML BUPIVACAINE 1/10 % EPIDURAL INFUSION (WH - ANES)
INTRAMUSCULAR | Status: AC
  Filled 2018-09-24: qty 100

## 2018-09-24 MED ORDER — LIDOCAINE HCL (PF) 1 % IJ SOLN: 30.0000 mL | INTRAMUSCULAR | Status: DC | PRN

## 2018-09-24 MED ORDER — AZITHROMYCIN 500 MG PO TABS
1000.0000 mg | ORAL_TABLET | Freq: Once | ORAL | Status: AC
  Administered 2018-09-24: 1000 mg via ORAL
  Filled 2018-09-24: qty 2

## 2018-09-24 MED ORDER — ACETAMINOPHEN 325 MG PO TABS
650.0000 mg | ORAL_TABLET | ORAL | Status: DC | PRN
  Administered 2018-09-24 (×2): 650 mg via ORAL
  Filled 2018-09-24 (×2): qty 2

## 2018-09-24 MED ORDER — OXYCODONE-ACETAMINOPHEN 5-325 MG PO TABS: 1.0000 | ORAL_TABLET | ORAL | Status: DC | PRN

## 2018-09-24 MED ORDER — FENTANYL 2.5 MCG/ML BUPIVACAINE 1/10 % EPIDURAL INFUSION (WH - ANES)
14.0000 mL/h | INTRAMUSCULAR | Status: DC | PRN
  Administered 2018-09-24 (×3): 14 mL/h via EPIDURAL
  Filled 2018-09-24 (×2): qty 100

## 2018-09-24 MED ORDER — LACTATED RINGERS IV SOLN
INTRAVENOUS | Status: DC
  Administered 2018-09-24: 04:00:00 via INTRAVENOUS

## 2018-09-24 MED ORDER — OXYTOCIN BOLUS FROM INFUSION: 500.0000 mL | Freq: Once | INTRAVENOUS | Status: DC

## 2018-09-24 MED ORDER — OXYTOCIN 40 UNITS IN LACTATED RINGERS INFUSION - SIMPLE MED
2.5000 [IU]/h | INTRAVENOUS | Status: DC
  Filled 2018-09-24: qty 1000

## 2018-09-24 MED ORDER — LACTATED RINGERS IV SOLN: 500.0000 mL | Freq: Once | INTRAVENOUS | Status: DC

## 2018-09-24 MED ORDER — LACTATED RINGERS IV SOLN: 500.0000 mL | INTRAVENOUS | Status: DC | PRN

## 2018-09-24 MED ORDER — OXYCODONE-ACETAMINOPHEN 5-325 MG PO TABS: 2.0000 | ORAL_TABLET | ORAL | Status: DC | PRN

## 2018-09-24 MED ORDER — LIDOCAINE HCL (PF) 1 % IJ SOLN
INTRAMUSCULAR | Status: DC | PRN
  Administered 2018-09-24: 13 mL via EPIDURAL

## 2018-09-24 MED ORDER — SODIUM CHLORIDE 0.9 % IV SOLN
2.0000 g | Freq: Once | INTRAVENOUS | Status: AC
  Administered 2018-09-24: 2 g via INTRAVENOUS
  Filled 2018-09-24: qty 2

## 2018-09-24 MED ORDER — ONDANSETRON HCL 4 MG/2ML IJ SOLN
4.0000 mg | Freq: Four times a day (QID) | INTRAMUSCULAR | Status: DC | PRN
  Administered 2018-09-24 (×2): 4 mg via INTRAVENOUS
  Filled 2018-09-24 (×3): qty 2

## 2018-09-24 MED ORDER — FENTANYL CITRATE (PF) 100 MCG/2ML IJ SOLN
50.0000 ug | Freq: Once | INTRAMUSCULAR | Status: AC
  Administered 2018-09-24: 100 ug via INTRAVENOUS
  Filled 2018-09-24: qty 2

## 2018-09-24 MED ORDER — OXYCODONE-ACETAMINOPHEN 5-325 MG PO TABS
1.0000 | ORAL_TABLET | Freq: Once | ORAL | Status: AC
  Administered 2018-09-24: 1 via ORAL
  Filled 2018-09-24: qty 1

## 2018-09-24 MED ORDER — DIPHENHYDRAMINE HCL 50 MG/ML IJ SOLN: 12.5000 mg | INTRAMUSCULAR | Status: DC | PRN

## 2018-09-24 NOTE — MAU Note (Addendum)
CTX every 3 minutes.  Was 5 cm on Monday.  Reports having a gush of fluid after her shower earlier tonight.  Took a double dose of procardia at 0030 tonight-didn't help. Hx of deliveries at 34 and 36 weeks.

## 2018-09-24 NOTE — Anesthesia Procedure Notes (Signed)
Epidural Patient location during procedure: OB Start time: 09/24/2018 5:18 AM End time: 09/24/2018 5:31 AM  Staffing Anesthesiologist: Lowella Curb, MD Performed: anesthesiologist   Preanesthetic Checklist Completed: patient identified, site marked, surgical consent, pre-op evaluation, timeout performed, IV checked, risks and benefits discussed and monitors and equipment checked  Epidural Patient position: sitting Prep: ChloraPrep Patient monitoring: heart rate, cardiac monitor, continuous pulse ox and blood pressure Approach: midline Location: L2-L3 Injection technique: LOR saline  Needle:  Needle type: Tuohy  Needle gauge: 17 G Needle length: 9 cm Needle insertion depth: 6 cm Catheter type: closed end flexible Catheter size: 20 Guage Catheter at skin depth: 10 cm Test dose: negative  Assessment Events: blood not aspirated, injection not painful, no injection resistance, negative IV test and no paresthesia  Additional Notes Reason for block:procedure for pain

## 2018-09-24 NOTE — H&P (Addendum)
[name] is a 37 y.o. female presenting for recurrent and persistent preterm contractions. Has been having these for the past month or so.  Is on Procardia at home, took double dose at midnight with  No relief.  Had some water leaking after her bath tonight.  . Had Betamethasone series 9/19-20   Also had Magnesium sulfate that day.  OB History    Gravida  6   Para  5   Term  4   Preterm  1   AB      Living  5     SAB      TAB      Ectopic      Multiple  0   Live Births  5          Past Medical History:  Diagnosis Date  . Diabetes mellitus without complication (HCC)   . GERD (gastroesophageal reflux disease)   . History of cocaine use   . Hypertension   . Kidney stones   . Ovarian cyst   . Pregnancy induced hypertension    Past Surgical History:  Procedure Laterality Date  . APPENDECTOMY    . HEMORRHOID SURGERY N/A 01/19/2016   Procedure: EXTENSIVE HEMORRHOIDECTOMY;  Surgeon: Franky Macho, MD;  Location: [facility];  Service: General;  Laterality: N/A;  . kidney stents    . LITHOTRIPSY     Family History: family history includes Cancer in her mother; Diabetes in her maternal grandmother. Social History:  reports that she has never smoked. She has never used smokeless tobacco. She reports that she does not drink alcohol or use drugs.     Maternal Diabetes: Yes:  Diabetes Type:  Diet controlled Genetic Screening: Normal Maternal Ultrasounds/Referrals: Normal was question of perimembranous, neg echo Fetal Ultrasounds or other Referrals:  Fetal echo normal Maternal Substance Abuse:  No  History cocaine in 2017, all screens negative this pregnancy Significant Maternal Medications:  Meds include: Other:  procardia, has had Betamethasone and MgSO4 Significant Maternal Lab Results:  Lab values include: Group B Strep negative Other Comments:  Gest HTN, Gest DM, no meds with either  Review of Systems  Constitutional: Negative for chills and fever.   Gastrointestinal: Positive for abdominal pain. Negative for constipation, diarrhea, nausea and vomiting.  Musculoskeletal: Positive for back pain.  Neurological: Negative for dizziness, focal weakness and headaches.   Maternal Medical History:  Reason for admission: Rupture of membranes and contractions.  Nausea.  Contractions: Onset was 1-2 hours ago.   Frequency: regular.   Perceived severity is moderate.    Fetal activity: Perceived fetal activity is normal.   Last perceived fetal movement was within the past hour.    Prenatal complications: PIH and preterm labor.   No bleeding, pre-eclampsia or substance abuse (neg drug screens).   Prenatal Complications - Diabetes: gestational. Diabetes is managed by diet.      Dilation: 4.5 Exam by:: Mayford Knife, CNM Blood pressure 138/85, pulse 96, temperature 98.5 F (36.9 C), resp. rate (!) 21, last menstrual period 01/12/2018. Maternal Exam:  Uterine Assessment: Contraction strength is moderate.  Contraction frequency is regular.   Abdomen: Patient reports no abdominal tenderness. Fetal presentation: vertex  Introitus: Normal vulva. Normal vagina.  Ferning test: positive.  Nitrazine test: not done. Amniotic fluid character: clear. Amnisure Positive  Pelvis: adequate for delivery.   Cervix: Cervix evaluated by digital exam.     Fetal Exam Fetal Monitor Review: Mode: ultrasound.   Baseline rate: 140.  Variability: moderate (6-25 bpm).  Pattern: accelerations present and no decelerations.    Fetal State Assessment: Category I - tracings are normal.     Physical Exam  Constitutional: She is oriented to person, place, and time. She appears well-developed and well-nourished. No distress.  HENT:  Head: Normocephalic.  Cardiovascular: Normal rate.  Respiratory: Effort normal. No respiratory distress.  GI: Soft. She exhibits no distension. There is no tenderness. There is no rebound and no guarding.  Genitourinary:   Genitourinary Comments: Mucous in vault, no pooling + ferning Initial exam:  4-5/60-70/-3/vtx  Dilation: 6 Effacement (%): 60 Cervical Position: Posterior Station: -3 Presentation: Vertex Exam by:: Dr. Darin Engels   Musculoskeletal: Normal range of motion.  Neurological: She is alert and oriented to person, place, and time.  Skin: Skin is warm and dry.  Psychiatric: She has a normal mood and affect.     Prenatal labs: ABO, Rh: --/--/O POS (09/27 0124) Antibody: NEG (09/27 0124) Rubella: 1.66 (04/16 1115) RPR: Non Reactive (09/21 2240)  HBsAg: Negative (04/16 1115)  HIV: Non Reactive (08/20 1122)  GBS:     Assessment/Plan: Single intrauterine pregnancy at [redacted]w[redacted]d PPROM Preterm labor  Admit to [facility] Routine orders Has had Betamethasone and MgSO4  Did get one dose of Terbutaline and one Percocet prior to discovery of Positive PPROM (UCs did not diminish)  Labor team to follow   Wynelle Bourgeois 09/24/2018, 2:39 AM

## 2018-09-24 NOTE — Progress Notes (Signed)
   [name] is a 37 y.o. [id number] at [redacted]w[redacted]d  admitted for suspected pprom.   Subjective: Throwing up in bed; feels more pelvic pressure that is "all the time".  Objective: Vitals:   09/24/18 1331 09/24/18 1340 09/24/18 1402 09/24/18 1431  BP: 124/79 129/82 120/62 138/84  Pulse: 96 (!) 105 87 93  Resp:  18    Temp:      TempSrc:      SpO2:      Weight:      Height:       No intake/output data recorded.  FHT:  FHR: 140 bpm, variability: moderate,  accelerations:  Present,  decelerations:  Absent UC: q 2-3 SVE:   Dilation: 5 Effacement (%): 80 Station: -1 Exam by:: Ramy Greth, CNM   Labs: Lab Results  Component Value Date   WBC 11.9 (H) 09/24/2018   HGB 10.7 (L) 09/24/2018   HCT 32.1 (L) 09/24/2018   MCV 87.2 09/24/2018   PLT 235 09/24/2018    Assessment / Plan: Patient cervix softer and stretchier, now 5 cm. Discussed with Dr. Barnabas Lister, who recommends watch her again for 2 more hours.   Labor: possible early labor Fetal Wellbeing:  Category I Pain Control:  Epidural Anticipated MOD:  NSVD  Charlesetta Garibaldi Madason Rauls 09/24/2018, 3:18 PM

## 2018-09-24 NOTE — Progress Notes (Signed)
Patient ID: [name], female   DOB: [birth year], 37 y.o.   MRN: [id number]  Just got epidural; told pt was 7-8cm  BP 144/73, P 118 FHR 150s, +accels, no decels Ctx q 5 mins Cx 4/thick/high (vtx by U/S)  U/S tonight: AFI 22cm  IUP@34 .6wks + amnisure  Plan to present at rounds to determine plan of care  Cam Hai CNM 09/24/2018 5:57 AM

## 2018-09-24 NOTE — Progress Notes (Signed)
   [name] is a 37 y.o. [id number] at [redacted]w[redacted]d  admitted for suspected PRPROM at 34 weeks. Patient had positive amnisure in MAU but negative pooling. AFI was 22. Patient was contracting and appeared to be close to delivery. SHe received an epidural in the middle of the night when it appeared delivery was imminent based on cervical check of 7-8 cm.    Subjective: Patient resting comfortably with epidural. Denies any pain or complaints other than pelvic pressure.   Objective: Vitals:   09/24/18 0931 09/24/18 1002 09/24/18 1031 09/24/18 1131  BP: (!) 105/58 (!) 115/55 (!) 120/59 (!) 111/57  Pulse: 90 (!) 103 96 84  Resp:    18  Temp:      TempSrc:      SpO2:      Weight:      Height:       No intake/output data recorded.  FHT:  FHR: 140 bpm, variability: moderate,  accelerations:  Abscent,  decelerations:  Absent UC:   irregular, every 3 minutes SVE:   Dilation: 4 Effacement (%): 80 Station: -1 Exam by:: Philipp Deputy CNM   Labs: Lab Results  Component Value Date   WBC 11.9 (H) 09/24/2018   HGB 10.7 (L) 09/24/2018   HCT 32.1 (L) 09/24/2018   MCV 87.2 09/24/2018   PLT 235 09/24/2018    Assessment / Plan: Discussed with patient that we do not think she has PPROMed, given the amount of amniotic fluid, the negative pooling and that this examiner can feel a sack on baby's head. However, will place a pad underneath patient and continue to monitor for cervical change.  BP has been normotensive; at this point, low concern for pre-e but will repeat Pre-e labs.   Labor: not in active labor Fetal Wellbeing:  Category I. FHR tracing does not show accelerations, however, fetus is 34 weeks. Moderate variability and lack of decelerations are all reassuring.  Pain Control:  Epidural Anticipated MOD:  Possible plan for discharge this afternoon if no cervical change.   Charlesetta Garibaldi Kooistra 09/24/2018, 11:39 AM

## 2018-09-24 NOTE — Anesthesia Preprocedure Evaluation (Signed)
Anesthesia Evaluation  Patient identified by MRN, date of birth, ID band Patient awake    Reviewed: Allergy & Precautions, NPO status , Patient's Chart, lab work & pertinent test results  History of Anesthesia Complications Negative for: history of anesthetic complications  Airway Mallampati: II  TM Distance: >3 FB Neck ROM: Full    Dental   Pulmonary neg pulmonary ROS,    breath sounds clear to auscultation       Cardiovascular hypertension, Pt. on medications  Rhythm:Regular Rate:Normal     Neuro/Psych TIAnegative psych ROS   GI/Hepatic GERD  Controlled and Medicated,(+)     substance abuse  cocaine use,   Endo/Other  diabetes, Gestational  Renal/GU negative Renal ROS  negative genitourinary   Musculoskeletal negative musculoskeletal ROS (+)   Abdominal   Peds  Hematology  (+) anemia ,   Anesthesia Other Findings   Reproductive/Obstetrics (+) Pregnancy                            Anesthesia Physical Anesthesia Plan  ASA: III  Anesthesia Plan: Epidural   Post-op Pain Management:    Induction:   PONV Risk Score and Plan: 2 and Treatment may vary due to age or medical condition  Airway Management Planned: Natural Airway  Additional Equipment: None  Intra-op Plan:   Post-operative Plan:   Informed Consent: I have reviewed the patients History and Physical, chart, labs and discussed the procedure including the risks, benefits and alternatives for the proposed anesthesia with the patient or authorized representative who has indicated his/her understanding and acceptance.     Plan Discussed with: Anesthesiologist  Anesthesia Plan Comments: (Labs reviewed. Platelets acceptable, patient not taking any blood thinning medications. Per RN, FHR tracing reported to be stable enough for sitting procedure. Risks and benefits discussed with patient, including PDPH, backache, epidural  hematoma, failed epidural, allergic reaction, and nerve injury. Patient expressed understanding and wished to proceed.)        Anesthesia Quick Evaluation  

## 2018-09-24 NOTE — Progress Notes (Addendum)
Labor Progress Note [name] is a 37 y.o. [id number] at [redacted]w[redacted]d presented for observation after possible ROM at home.  S: Patient reports that she uncomfortable and feeling frustrated when she will deliver. She denies feeling pelvic pressure.   O:  BP (!) 115/55   Pulse (!) 103   Temp 98 F (36.7 C) (Oral)   Resp 16   Ht 5\' 3"  (1.6 m)   Wt 81.2 kg   LMP 01/12/2018   SpO2 97%   BMI 31.71 kg/m  EFM: 150bpm/moderate variability/positive accelerations/negative decelerations  CVE: Dilation: 4 Effacement (%): 80 Cervical Position: Posterior Station: -1 Presentation: Vertex Exam by:: Philipp Deputy CNM   A&P: 37 y.o. [id number] [redacted]w[redacted]d observation after suspected ROM. Urine protein-creatinine ordered. Discussed with patient that we would be monitoring her for progression in cervical dilation.  #Labor: Uterine irritability noted.  #Pain: epidural  #FWB: category 1  #GBS negative    Charyl Dancer, Student-PA 10:22 AM    I confirm that I have verified the information documented in the physician assistant students's note and that I have also personally reperformed the physical exam and all medical decision making activities.   Luna Kitchens

## 2018-09-24 NOTE — Progress Notes (Signed)
LABOR PROGRESS NOTE  [name] is a 37 y.o. Z6X0960 at [redacted]w[redacted]d  admitted for observation after possible ROM at home.   In MAU: amnisure positive, fern negative  Subjective: Patient reports feeling very uncomfortable. States her contractions have become much stronger.   Objective: BP 138/85   Pulse 96   Temp 98.2 F (36.8 C) (Oral)   Resp (!) 21   Ht 5\' 3"  (1.6 m)   Wt 81.2 kg   LMP 01/12/2018   BMI 31.71 kg/m  or  Vitals:   09/24/18 0136 09/24/18 0218 09/24/18 0336  BP: (!) 148/88 138/85   Pulse: 98 96   Resp: (!) 21    Temp: 98.5 F (36.9 C)  98.2 F (36.8 C)  TempSrc:   Oral  Weight:   81.2 kg  Height:   5\' 3"  (1.6 m)   Dilation: 6 Effacement (%): 60 Cervical Position: Posterior Station: -3 Presentation: Vertex Exam by:: Dr. Wilburt Finlay: baseline rate 160, moderate varibility, +acel, -decel Toco: irregular, every 1-4 min  Labs: Lab Results  Component Value Date   WBC 11.9 (H) 09/24/2018   HGB 10.7 (L) 09/24/2018   HCT 32.1 (L) 09/24/2018   MCV 87.2 09/24/2018   PLT 235 09/24/2018    Patient Active Problem List   Diagnosis Date Noted  . Preterm premature rupture of membranes (PPROM) with onset of labor after 24 hours of rupture in first trimester, antepartum 09/24/2018  . Gestational hypertension, third trimester 09/21/2018  . Threatened preterm labor 09/10/2018  . Unwanted fertility 08/11/2018  . Preterm contractions 08/05/2018  . History of cocaine use 07/28/2018  . Kidney stones 07/07/2018  . History of preterm delivery, currently pregnant in second trimester 06/30/2018  . Supervision of high risk pregnancy, antepartum 04/07/2018  . Advanced maternal age in multigravida 04/07/2018  . Prediabetes   . HLD (hyperlipidemia)   . TIA (transient ischemic attack) 07/30/2016  . Acute left-sided weakness 07/30/2016  . Cocaine use 01/16/2016  . Gestational diabetes mellitus, class A1 12/20/2015  . Anemia 09/11/2015  . Right ovarian cyst  08/30/2015  . HTN (hypertension) 01/09/2014    Assessment / Plan: 37 y.o. [id number] at [redacted]w[redacted]d here for observation after possible ROM   Labor: observation, can consider augmentation pending results of AFI. S/p betamethasone and MgSO4.  Fetal Wellbeing:  Cat 1 Pain Control:  Fentanyl x1. If continued discomfort can consider epidural   [name], DO 09/24/2018, 3:48 AM

## 2018-09-24 NOTE — Discharge Summary (Signed)
Postpartum Discharge Summary     Patient Name: [name] DOB: September 03, 1981 MRN: [id number]  Date of admission: 09/24/2018 Delivering Provider:     Date of discharge: 09/24/2018  Admitting diagnosis: 35.5WKS CTX, LOTS OF PRESSURE Intrauterine pregnancy: [redacted]w[redacted]d     Secondary diagnosis:  Active Problems:   Preterm premature rupture of membranes (PPROM) with onset of labor after 24 hours of rupture in first trimester, antepartum  Additional problems: Gestational hypertension     Discharge diagnosis: Preterm labor                                                                                               Complications:   Hospital course:   Patient was admitted for suspected PPROM after positive amnisure. AFI was found to be 22 on 10-3; patient received epidural early morning of 10-3 after she reported strong contractions and VE was 7-8 cm. Recheck after epidural revealed stretchy cervix 4-5 cm dilated; patient still with epidural. After observation all day on 10-3, patient now only 5.5 cm. Cervical exam was done three times during the day by the same examiner and no change was observed.     BMZ received: Yes and Magnesium on 9-19 and 9/20  Physical exam  Vitals:   09/24/18 1601 09/24/18 1631 09/24/18 1702 09/24/18 1732  BP: 134/74 126/75 136/82 (!) 126/59  Pulse: 91 91 94 91  Resp: 18 16    Temp:      TempSrc:      SpO2:      Weight:      Height:       General: alert, cooperative and no distress DVT Evaluation: No evidence of DVT seen on physical exam. Labs: Lab Results  Component Value Date   WBC 11.9 (H) 09/24/2018   HGB 10.7 (L) 09/24/2018   HCT 32.1 (L) 09/24/2018   MCV 87.2 09/24/2018   PLT 235 09/24/2018   CMP Latest Ref Rng & Units 09/24/2018  Glucose 70 - 99 mg/dL 89  BUN 6 - 20 mg/dL 8  Creatinine 4.09 - 8.11 mg/dL 9.14  Sodium 782 - 956 mmol/L 136  Potassium 3.5 - 5.1 mmol/L 3.5  Chloride 98 - 111 mmol/L 105  CO2 22 - 32 mmol/L 22  Calcium  8.9 - 10.3 mg/dL 9.3  Total Protein 6.5 - 8.1 g/dL 6.3(L)  Total Bilirubin 0.3 - 1.2 mg/dL 0.3  Alkaline Phos 38 - 126 U/L 70  AST 15 - 41 U/L 24  ALT 0 - 44 U/L 21    Discharge instruction: per After Visit Summary and "Baby and Me Booklet".  After visit meds:  Allergies as of 09/24/2018   No Known Allergies     Medication List    TAKE these medications   acetaminophen 325 MG tablet Commonly known as:  TYLENOL Take 650 mg by mouth every 6 (six) hours as needed for mild pain or headache.   calcium carbonate 500 MG chewable tablet Commonly known as:  TUMS - dosed in mg elemental calcium Chew 2 tablets by mouth daily as needed for indigestion or heartburn.   COMFORT FIT  MATERNITY SUPP LG Misc 1 Units by Does not apply route daily.   DICLEGIS 10-10 MG Tbec Generic drug:  Doxylamine-Pyridoxine Take 1 tablet by mouth 2 (two) times daily as needed (nausea/vomiting).   NIFEdipine 30 MG 24 hr tablet Commonly known as:  PROCARDIA-XL/ADALAT CC Take 1 tablet (30 mg total) by mouth 2 (two) times daily.   prenatal multivitamin Tabs tablet Take 1 tablet by mouth daily at 12 noon.       Diet: carb modified diet  Activity: Advance as tolerated. Pelvic rest for 6 weeks.     Follow up Appt: Future Appointments  Date Time Provider Department Center  10/01/2018  8:45 AM [facility]TR[facility] None  10/01/2018  9:30 AM Conan Bowens, MD [facility] None  10/05/2018  9:45 AM [facility] NURSE [facility] None  10/08/2018  9:30 AM [facility] None  10/08/2018 10:15 AM Conan Bowens, MD [facility] None  10/12/2018  9:30 AM [facility] None  10/12/2018 10:30 AM [facility] NURSE [facility] None  10/15/2018  9:30 AM [facility] None  10/15/2018 10:30 AM Anyanwu, Jethro Bastos, MD [facility] None   Follow up Visit:No follow-ups on file.  09/24/2018 Marylene Land, CNM

## 2018-09-24 NOTE — Discharge Instructions (Signed)
Fetal Movement Counts Patient Name: ________________________________________________ Patient Due Date: ____________________ What is a fetal movement count? A fetal movement count is the number of times that you feel your baby move during a certain amount of time. This may also be called a fetal kick count. A fetal movement count is recommended for every pregnant woman. You may be asked to start counting fetal movements as early as week 28 of your pregnancy. Pay attention to when your baby is most active. You may notice your baby's sleep and wake cycles. You may also notice things that make your baby move more. You should do a fetal movement count: When your baby is normally most active. At the same time each day.  A good time to count movements is while you are resting, after having something to eat and drink. How do I count fetal movements? Find a quiet, comfortable area. Sit, or lie down on your side. Write down the date, the start time and stop time, and the number of movements that you felt between those two times. Take this information with you to your health care visits. For 2 hours, count kicks, flutters, swishes, rolls, and jabs. You should feel at least 10 movements during 2 hours. You may stop counting after you have felt 10 movements. If you do not feel 10 movements in 2 hours, have something to eat and drink. Then, keep resting and counting for 1 hour. If you feel at least 4 movements during that hour, you may stop counting. Contact a health care provider if: You feel fewer than 4 movements in 2 hours. Your baby is not moving like he or she usually does. Date: ____________ Start time: ____________ Stop time: ____________ Movements: ____________ Date: ____________ Start time: ____________ Stop time: ____________ Movements: ____________ Date: ____________ Start time: ____________ Stop time: ____________ Movements: ____________ Date: ____________ Start time: ____________ Stop time:  ____________ Movements: ____________ Date: ____________ Start time: ____________ Stop time: ____________ Movements: ____________ Date: ____________ Start time: ____________ Stop time: ____________ Movements: ____________ Date: ____________ Start time: ____________ Stop time: ____________ Movements: ____________ Date: ____________ Start time: ____________ Stop time: ____________ Movements: ____________ Date: ____________ Start time: ____________ Stop time: ____________ Movements: ____________ This information is not intended to replace advice given to you by your health care provider. Make sure you discuss any questions you have with your health care provider. Document Released: 01/08/2007 Document Revised: 08/07/2016 Document Reviewed: 01/18/2016 Elsevier Interactive Patient Education  2018 [employer]. Premature Rupture and Preterm Premature Rupture of Membranes A sac made up of membranes surrounds your baby in the womb (uterus). Rupture of membranes is when this sac breaks open. This is also known as your "water breaking." When this sac breaks before labor starts, it is called premature rupture of membranes (PROM). If this happens before 37 weeks of being pregnant, it is called preterm premature rupture of membranes (PPROM). PPROM is serious. It needs medical care right away. What increases the risk of PPROM? PPROM is more likely to happen in women who:  Have an infection.  Have had PPROM before.  Have a cervix that is short.  Have bleeding during the second or third trimester.  Have a low BMI. This is a measure of body fat.  Smoke.  Use drugs.  Have a low socioeconomic status.  What problems can be caused by PROM and PPROM? This condition creates health dangers for the mother and the baby. These include:  Giving birth to the baby too early (prematurely).  Getting a  serious infection of the placenta (chorioamnionitis).  Having the placenta detach from the uterus early  (placental abruption).  Squeezing of the umbilical cord.  Getting a serious infection after delivery.  What are the signs of PROM and PPROM?  A sudden gush of fluid from the vagina.  A slow leak of fluid from the vagina.  Your underwear is wet. What should I do if I think my water broke? Call your doctor right away. You will need to go to the hospital to get checked right away. What happens if I am told that I have PROM or PPROM? You will have tests done at the hospital.  If you have PROM, you may be given medicine to start labor (be induced). This may be done if you are not having contractions during the 24 hours after your water broke.  If you have PPROM and are not having contractions, you may be given medicine to start labor. It will depend on how far along you are in your pregnancy.  If you have PPROM:  You and your baby will be watched closely to see if you have infections or other problems.  You may be given: ? An antibiotic medicine. This can stop an infection from starting. ? A steroid medicine. This can help your baby's lungs develop faster. ? A medicine to help prevent cerebral palsy in your baby. ? A medicine to stop early labor (preterm labor).  You may be told to stay in bed except to use the bathroom (bed rest).  You may be given medicine to start labor. This may be done if there are problems with you or the baby.  Your treatment will depend on many factors. Contact a doctor if:  Your water breaks and you are not having contractions. Get help right away if:  Your water breaks before you are [redacted] weeks pregnant. Summary  When your water breaks before labor starts, it is called premature rupture of membranes (PROM).  When PROM happens before 37 weeks of pregnancy, it is called preterm premature rupture of membranes (PPROM).  If you are not having contractions, your labor may be started for you. This information is not intended to replace advice given to  you by your health care provider. Make sure you discuss any questions you have with your health care provider. Document Released: 03/07/2009 Document Revised: 08/29/2016 Document Reviewed: 08/29/2016 Elsevier Interactive Patient Education  2017 [employer]. Preterm Labor and Birth Information Pregnancy normally lasts 39-41 weeks. Preterm labor is when labor starts early. It starts before you have been pregnant for 37 whole weeks. What are the risk factors for preterm labor? Preterm labor is more likely to occur in women who:  Have an infection while pregnant.  Have a cervix that is short.  Have gone into preterm labor before.  Have had surgery on their cervix.  Are younger than age 57.  Are older than age 71.  Are African American.  Are pregnant with two or more babies.  Take street drugs while pregnant.  Smoke while pregnant.  Do not gain enough weight while pregnant.  Got pregnant right after another pregnancy.  What are the symptoms of preterm labor? Symptoms of preterm labor include:  Cramps. The cramps may feel like the cramps some women get during their period. The cramps may happen with watery poop (diarrhea).  Pain in the belly (abdomen).  Pain in the lower back.  Regular contractions or tightening. It may feel like your belly is getting tighter.  Pressure in the lower belly that seems to get stronger.  More fluid (discharge) leaking from the vagina. The fluid may be watery or bloody.  Water breaking.  Why is it important to notice signs of preterm labor? Babies who are born early may not be fully developed. They have a higher chance for:  Long-term heart problems.  Long-term lung problems.  Trouble controlling body systems, like breathing.  Bleeding in the brain.  A condition called cerebral palsy.  Learning difficulties.  Death.  These risks are highest for babies who are born before 34 weeks of pregnancy. How is preterm labor  treated? Treatment depends on:  How long you were pregnant.  Your condition.  The health of your baby.  Treatment may involve:  Having a stitch (suture) placed in your cervix. When you give birth, your cervix opens so the baby can come out. The stitch keeps the cervix from opening too soon.  Staying at the hospital.  Taking or getting medicines, such as: ? Hormone medicines. ? Medicines to stop contractions. ? Medicines to help the babys lungs develop. ? Medicines to prevent your baby from having cerebral palsy.  What should I do if I am in preterm labor? If you think you are going into labor too soon, call your doctor right away. How can I prevent preterm labor?  Do not use any tobacco products. ? Examples of these are cigarettes, chewing tobacco, and e-cigarettes. ? If you need help quitting, ask your doctor.  Do not use street drugs.  Do not use any medicines unless you ask your doctor if they are safe for you.  Talk with your doctor before taking any herbal supplements.  Make sure you gain enough weight.  Watch for infection. If you think you might have an infection, get it checked right away.  If you have gone into preterm labor before, tell your doctor. This information is not intended to replace advice given to you by your health care provider. Make sure you discuss any questions you have with your health care provider. Document Released: 03/07/2009 Document Revised: 05/21/2016 Document Reviewed: 05/01/2016 Elsevier Interactive Patient Education  2018 [employer].

## 2018-09-25 ENCOUNTER — Encounter (HOSPITAL_COMMUNITY): Payer: Self-pay

## 2018-09-25 ENCOUNTER — Inpatient Hospital Stay (HOSPITAL_BASED_OUTPATIENT_CLINIC_OR_DEPARTMENT_OTHER): Payer: Medicaid Other

## 2018-09-25 ENCOUNTER — Inpatient Hospital Stay (EMERGENCY_DEPARTMENT_HOSPITAL)
Admission: AD | Admit: 2018-09-25 | Discharge: 2018-09-25 | Disposition: A | Discharge status: Home / Self Care | Payer: Medicaid Other | Source: Ambulatory Visit | Attending: Obstetrics & Gynecology | Admitting: Obstetrics & Gynecology

## 2018-09-25 DIAGNOSIS — O139 Gestational [pregnancy-induced] hypertension without significant proteinuria, unspecified trimester: Secondary | ICD-10-CM | POA: Insufficient documentation

## 2018-09-25 DIAGNOSIS — O26893 Other specified pregnancy related conditions, third trimester: Secondary | ICD-10-CM | POA: Insufficient documentation

## 2018-09-25 DIAGNOSIS — K219 Gastro-esophageal reflux disease without esophagitis: Secondary | ICD-10-CM

## 2018-09-25 DIAGNOSIS — Z3A35 35 weeks gestation of pregnancy: Secondary | ICD-10-CM

## 2018-09-25 DIAGNOSIS — N898 Other specified noninflammatory disorders of vagina: Secondary | ICD-10-CM

## 2018-09-25 DIAGNOSIS — O99613 Diseases of the digestive system complicating pregnancy, third trimester: Secondary | ICD-10-CM

## 2018-09-25 DIAGNOSIS — O24419 Gestational diabetes mellitus in pregnancy, unspecified control: Secondary | ICD-10-CM

## 2018-09-25 LAB — AMNISURE RUPTURE OF MEMBRANE (ROM) NOT AT ARMC: AMNISURE: NEGATIVE

## 2018-09-25 NOTE — MAU Note (Signed)
Had a large gush of fluid at 2230-clear, mucousy fluid.  Decreased fetal movement in the past hour.  CTX every 4 minutes.  No other VB.

## 2018-09-25 NOTE — MAU Provider Note (Signed)
Chief Complaint:  Rupture of Membranes   First Provider Initiated Contact with Patient 09/25/18 0417     HPI: [name] is a 37 y.o. Z6X0960 at 32w0dwho presents to maternity admissions reporting large gush of fluid with mucous and blood, soaked entire back of clothing.  States has continued to leak off and on all night.  UCs continue.. She reports good fetal movement, denies vaginal bleeding, vaginal itching/burning, urinary symptoms, h/a, dizziness, n/v, diarrhea, constipation or fever/chills.  She denies headache, visual changes or RUQ abdominal pain.  Vaginal Discharge  The patient's primary symptoms include pelvic pain, vaginal bleeding and vaginal discharge. The patient's pertinent negatives include no genital itching, genital lesions or genital odor. This is a new problem. The current episode started today. The problem occurs intermittently. The problem has been unchanged. She is pregnant. Associated symptoms include abdominal pain. Pertinent negatives include no chills, constipation, diarrhea, fever, nausea or vomiting. The vaginal discharge was clear. The vaginal bleeding is spotting. She has not been passing clots. She has not been passing tissue. Nothing aggravates the symptoms. She has tried nothing for the symptoms.   RN Note: Had a large gush of fluid at 2230-clear, mucousy fluid.  Decreased fetal movement in the past hour.  CTX every 4 minutes.  No other VB  Past Medical History: Past Medical History:  Diagnosis Date  . Diabetes mellitus without complication (HCC)   . GERD (gastroesophageal reflux disease)   . History of cocaine use   . Hypertension   . Kidney stones   . Ovarian cyst   . Pregnancy induced hypertension     Past obstetric history: OB History  Gravida Para Term Preterm AB Living  6 5 4 1   5   SAB TAB Ectopic Multiple Live Births        0 5    # Outcome Date GA Lbr Len/2nd Weight Sex Delivery Anes PTL Lv  6 Current           5 Preterm  01/13/16 [redacted]w[redacted]d 05:59 / 00:04 2310 g M Vag-Spont None  LIV  4 Term 01/10/14 [redacted]w[redacted]d 09:00 / 04:13 3496 g M Vag-Spont EPI  LIV     Birth Comments: Mom 66 y/o G4P4. Labs neg. Gestational HTN on [place]. Baby 37 w, NSVD, L hydronephrosis which was normal at 36 w U/S. Had phototherapy due to 10 bili at 24 hrs. Remained at 10. Requested repeat today.  Mom O+/ baby O+.   3 Term 01/10/06 [redacted]w[redacted]d  3402 g F Vag-Spont EPI  LIV  2 Term 09/02/01 [redacted]w[redacted]d  4139 g M Vag-Spont EPI  LIV  1 Term 01/24/97 [redacted]w[redacted]d  2835 g F Vag-Spont EPI  LIV    Past Surgical History: Past Surgical History:  Procedure Laterality Date  . APPENDECTOMY    . HEMORRHOID SURGERY N/A 01/19/2016   Procedure: EXTENSIVE HEMORRHOIDECTOMY;  Surgeon: Franky Macho, MD;  Location: [facility];  Service: General;  Laterality: N/A;  . kidney stents    . LITHOTRIPSY      Family History: Family History  Problem Relation Age of Onset  . Cancer Mother        breast  . Diabetes Maternal Grandmother     Social History: Social History   Tobacco Use  . Smoking status: Never Smoker  . Smokeless tobacco: Never Used  Substance Use Topics  . Alcohol use: No  . Drug use: No    Comment: Hx Cocaine use    Allergies: No Known Allergies  Meds:  Facility-Administered Medications Prior to Admission  Medication Dose Route Frequency Provider Last Rate Last Dose  . HYDROXYprogesterone Caproate SOAJ 275 mg  275 mg Subcutaneous Weekly Denney, Rachelle A, CNM   275 mg at 09/21/18 1610   Medications Prior to Admission  Medication Sig Dispense Refill Last Dose  . acetaminophen (TYLENOL) 325 MG tablet Take 650 mg by mouth every 6 (six) hours as needed for mild pain or headache.    Past Week at Unknown time  . calcium carbonate (TUMS - DOSED IN MG ELEMENTAL CALCIUM) 500 MG chewable tablet Chew 2 tablets by mouth daily as needed for indigestion or heartburn.   Past Week at Unknown time  . Doxylamine-Pyridoxine (DICLEGIS) 10-10 MG TBEC Take 1 tablet by mouth 2  (two) times daily as needed (nausea/vomiting).   09/23/2018 at Unknown time  . Elastic Bandages & Supports (COMFORT FIT MATERNITY SUPP LG) MISC 1 Units by Does not apply route daily. 1 each 0 Taking  . NIFEdipine (PROCARDIA-XL/ADALAT CC) 30 MG 24 hr tablet Take 1 tablet (30 mg total) by mouth 2 (two) times daily. 60 tablet 1 09/23/2018 at 0030  . Prenatal Vit-Fe Fumarate-FA (PRENATAL MULTIVITAMIN) TABS tablet Take 1 tablet by mouth daily at 12 noon.   09/23/2018 at Unknown time    I have reviewed patient's Past Medical Hx, Surgical Hx, Family Hx, Social Hx, medications and allergies.   ROS:  Review of Systems  Constitutional: Negative for chills and fever.  Gastrointestinal: Positive for abdominal pain. Negative for constipation, diarrhea, nausea and vomiting.  Genitourinary: Positive for pelvic pain and vaginal discharge.   Other systems negative  Physical Exam   Patient Vitals for the past 24 hrs:  BP Temp Pulse Resp SpO2 Height Weight  09/25/18 0350 134/76 98.4 F (36.9 C) 90 19 99 % 5\' 3"  (1.6 m) 83.8 kg   Constitutional: Well-developed, well-nourished female in no acute distress.  Cardiovascular: normal rate and rhythm Respiratory: normal effort, clear to auscultation bilaterally GI: Abd soft, non-tender, gravid appropriate for gestational age.   No rebound or guarding. MS: Extremities nontender, no edema, normal ROM Neurologic: Alert and oriented x 4.  GU: Neg CVAT.  PELVIC EXAM: Cervix pink, visually closed, without lesion, scant white mucous discharge, vaginal walls and external genitalia normal   NO POOLING, NO FERNING    Dilation: 4 Effacement (%): 70 Cervical Position: Posterior Station: Ballotable, -3 Presentation: Vertex Exam by:: Kimla Furth Cnm  FHT:  Baseline 140 , moderate variability, accelerations present, no decelerations Contractions: q 5 mins Irregular     Labs: --/--/O POS (10/03 0256)  Imaging:  AFI 16.2, was 22cm  MAU Course/MDM: I have ordered US  and reviewed results. Since story was good but exam was negative, will check AFI  >> less than last [place] but may be within margin of error.  Exam was totally negative for rupture.  No ferning.   NST reviewed, reactive, category I    Treatments in MAU included EFM.    Assessment: Single intrauterine pregnancy at [redacted]w[redacted]d Concern for preterm premature rupture of membranes with negative exam,   Plan: Discharge home Preterm Labor precautions and fetal kick counts Follow up in Office for prenatal visits and recheck of status  Encouraged to return here or to other Urgent Care/ED if she develops worsening of symptoms, increase in pain, fever, or other concerning symptoms.   Pt stable at time of discharge.  Wynelle Bourgeois CNM, MSN Certified Nurse-Midwife 09/25/2018 4:17 AM

## 2018-09-25 NOTE — Discharge Instructions (Signed)

## 2018-09-26 ENCOUNTER — Other Ambulatory Visit: Payer: Self-pay

## 2018-09-26 ENCOUNTER — Encounter (HOSPITAL_COMMUNITY): Payer: Self-pay

## 2018-09-26 ENCOUNTER — Inpatient Hospital Stay (HOSPITAL_COMMUNITY)
Admission: AD | Admit: 2018-09-26 | Discharge: 2018-09-28 | DRG: 833 | Disposition: A | Discharge status: Home / Self Care | Payer: Medicaid Other | Attending: Obstetrics & Gynecology | Admitting: Obstetrics & Gynecology

## 2018-09-26 ENCOUNTER — Inpatient Hospital Stay (HOSPITAL_BASED_OUTPATIENT_CLINIC_OR_DEPARTMENT_OTHER): Payer: Medicaid Other

## 2018-09-26 DIAGNOSIS — O99013 Anemia complicating pregnancy, third trimester: Secondary | ICD-10-CM | POA: Diagnosis present

## 2018-09-26 DIAGNOSIS — N898 Other specified noninflammatory disorders of vagina: Secondary | ICD-10-CM | POA: Diagnosis not present

## 2018-09-26 DIAGNOSIS — Z3A35 35 weeks gestation of pregnancy: Secondary | ICD-10-CM

## 2018-09-26 DIAGNOSIS — O99613 Diseases of the digestive system complicating pregnancy, third trimester: Secondary | ICD-10-CM | POA: Diagnosis present

## 2018-09-26 DIAGNOSIS — O139 Gestational [pregnancy-induced] hypertension without significant proteinuria, unspecified trimester: Secondary | ICD-10-CM | POA: Diagnosis not present

## 2018-09-26 DIAGNOSIS — R109 Unspecified abdominal pain: Secondary | ICD-10-CM

## 2018-09-26 DIAGNOSIS — K219 Gastro-esophageal reflux disease without esophagitis: Secondary | ICD-10-CM | POA: Diagnosis present

## 2018-09-26 DIAGNOSIS — O26893 Other specified pregnancy related conditions, third trimester: Secondary | ICD-10-CM | POA: Diagnosis not present

## 2018-09-26 DIAGNOSIS — O4703 False labor before 37 completed weeks of gestation, third trimester: Principal | ICD-10-CM | POA: Diagnosis present

## 2018-09-26 DIAGNOSIS — O4290 Premature rupture of membranes, unspecified as to length of time between rupture and onset of labor, unspecified weeks of gestation: Secondary | ICD-10-CM

## 2018-09-26 DIAGNOSIS — Z8673 Personal history of transient ischemic attack (TIA), and cerebral infarction without residual deficits: Secondary | ICD-10-CM | POA: Diagnosis not present

## 2018-09-26 DIAGNOSIS — O24410 Gestational diabetes mellitus in pregnancy, diet controlled: Secondary | ICD-10-CM | POA: Diagnosis present

## 2018-09-26 DIAGNOSIS — O133 Gestational [pregnancy-induced] hypertension without significant proteinuria, third trimester: Secondary | ICD-10-CM | POA: Diagnosis present

## 2018-09-26 DIAGNOSIS — F1491 Cocaine use, unspecified, in remission: Secondary | ICD-10-CM

## 2018-09-26 DIAGNOSIS — D649 Anemia, unspecified: Secondary | ICD-10-CM | POA: Diagnosis present

## 2018-09-26 DIAGNOSIS — Z87898 Personal history of other specified conditions: Secondary | ICD-10-CM

## 2018-09-26 DIAGNOSIS — O09293 Supervision of pregnancy with other poor reproductive or obstetric history, third trimester: Secondary | ICD-10-CM

## 2018-09-26 HISTORY — DX: Gestational (pregnancy-induced) hypertension without significant proteinuria, unspecified trimester: O13.9

## 2018-09-26 LAB — POCT FERN TEST: POCT FERN TEST: NEGATIVE

## 2018-09-26 LAB — COMPREHENSIVE METABOLIC PANEL
ALT: 23 U/L (ref 0–44)
AST: 27 U/L (ref 15–41)
Albumin: 2.8 g/dL — ABNORMAL LOW (ref 3.5–5.0)
Alkaline Phosphatase: 72 U/L (ref 38–126)
Anion gap: 9 (ref 5–15)
BUN: 7 mg/dL (ref 6–20)
CHLORIDE: 107 mmol/L (ref 98–111)
CO2: 20 mmol/L — AB (ref 22–32)
CREATININE: 0.7 mg/dL (ref 0.44–1.00)
Calcium: 8.2 mg/dL — ABNORMAL LOW (ref 8.9–10.3)
GFR calc non Af Amer: >60 mL/min (ref >60)
Glucose, Bld: 85 mg/dL (ref 70–99)
Potassium: 3.3 mmol/L — ABNORMAL LOW (ref 3.5–5.1)
SODIUM: 136 mmol/L (ref 135–145)
Total Bilirubin: 0.2 mg/dL — ABNORMAL LOW (ref 0.3–1.2)
Total Protein: 6.4 g/dL — ABNORMAL LOW (ref 6.5–8.1)

## 2018-09-26 LAB — PROTEIN / CREATININE RATIO, URINE
Creatinine, Urine: 78 mg/dL
Protein Creatinine Ratio: 0.17 mg/mg{Cre} — ABNORMAL HIGH (ref 0.00–0.15)
Total Protein, Urine: 13 mg/dL

## 2018-09-26 LAB — CBC
HCT: 29.4 % — ABNORMAL LOW (ref 36.0–46.0)
Hemoglobin: 9.7 g/dL — ABNORMAL LOW (ref 12.0–15.0)
MCH: 29.2 pg (ref 26.0–34.0)
MCHC: 33 g/dL (ref 30.0–36.0)
MCV: 88.6 fL (ref 78.0–100.0)
PLATELETS: 239 10*3/uL (ref 150–400)
RBC: 3.32 MIL/uL — AB (ref 3.87–5.11)
RDW: 14.9 % (ref 11.5–15.5)
WBC: 9 10*3/uL (ref 4.0–10.5)

## 2018-09-26 LAB — AMNISURE RUPTURE OF MEMBRANE (ROM) NOT AT ARMC: AMNISURE: POSITIVE

## 2018-09-26 LAB — TYPE AND SCREEN
ABO/RH(D): O POS
Antibody Screen: NEGATIVE

## 2018-09-26 LAB — GLUCOSE, CAPILLARY: GLUCOSE-CAPILLARY: 82 mg/dL (ref 70–99)

## 2018-09-26 MED ORDER — BUTORPHANOL TARTRATE 1 MG/ML IJ SOLN
2.0000 mg | Freq: Once | INTRAMUSCULAR | Status: AC
  Administered 2018-09-26: 2 mg via INTRAVENOUS
  Filled 2018-09-26: qty 2

## 2018-09-26 MED ORDER — FENTANYL CITRATE (PF) 100 MCG/2ML IJ SOLN
100.0000 ug | INTRAMUSCULAR | Status: DC | PRN
  Administered 2018-09-26 – 2018-09-27 (×4): 100 ug via INTRAVENOUS
  Filled 2018-09-26 (×3): qty 2

## 2018-09-26 MED ORDER — BUTALBITAL-APAP-CAFFEINE 50-325-40 MG PO TABS
2.0000 | ORAL_TABLET | Freq: Once | ORAL | Status: AC
  Administered 2018-09-26: 2 via ORAL
  Filled 2018-09-26: qty 2

## 2018-09-26 MED ORDER — FAMOTIDINE 20 MG PO TABS
10.0000 mg | ORAL_TABLET | Freq: Every day | ORAL | Status: DC
  Administered 2018-09-26 – 2018-09-27 (×2): 10 mg via ORAL
  Filled 2018-09-26 (×2): qty 1

## 2018-09-26 MED ORDER — ALUM & MAG HYDROXIDE-SIMETH 200-200-20 MG/5ML PO SUSP
30.0000 mL | Freq: Four times a day (QID) | ORAL | Status: DC | PRN
  Administered 2018-09-26 – 2018-09-27 (×2): 30 mL via ORAL
  Filled 2018-09-26 (×3): qty 30

## 2018-09-26 MED ORDER — OXYTOCIN BOLUS FROM INFUSION: 500.0000 mL | Freq: Once | INTRAVENOUS | Status: DC

## 2018-09-26 MED ORDER — ONDANSETRON HCL 4 MG/2ML IJ SOLN
4.0000 mg | Freq: Four times a day (QID) | INTRAMUSCULAR | Status: DC | PRN
  Administered 2018-09-26 – 2018-09-27 (×2): 4 mg via INTRAVENOUS
  Filled 2018-09-26 (×2): qty 2

## 2018-09-26 MED ORDER — CALCIUM CARBONATE ANTACID 500 MG PO CHEW
1.0000 | CHEWABLE_TABLET | Freq: Two times a day (BID) | ORAL | Status: DC | PRN
  Administered 2018-09-27 (×2): 200 mg via ORAL
  Filled 2018-09-26 (×2): qty 1

## 2018-09-26 MED ORDER — OXYTOCIN 40 UNITS IN LACTATED RINGERS INFUSION - SIMPLE MED
2.5000 [IU]/h | INTRAVENOUS | Status: DC
  Filled 2018-09-26: qty 1000

## 2018-09-26 MED ORDER — SOD CITRATE-CITRIC ACID 500-334 MG/5ML PO SOLN
30.0000 mL | ORAL | Status: DC | PRN
  Administered 2018-09-26: 30 mL via ORAL
  Filled 2018-09-26: qty 15

## 2018-09-26 MED ORDER — ACETAMINOPHEN 325 MG PO TABS: 650.0000 mg | ORAL_TABLET | ORAL | Status: DC | PRN

## 2018-09-26 MED ORDER — LACTATED RINGERS IV SOLN
500.0000 mL | INTRAVENOUS | Status: DC | PRN
  Administered 2018-09-26: 500 mL via INTRAVENOUS
  Administered 2018-09-26: 1000 mL via INTRAVENOUS

## 2018-09-26 MED ORDER — LIDOCAINE HCL (PF) 1 % IJ SOLN
30.0000 mL | INTRAMUSCULAR | Status: DC | PRN
  Filled 2018-09-26: qty 30

## 2018-09-26 MED ORDER — OXYCODONE-ACETAMINOPHEN 5-325 MG PO TABS: 1.0000 | ORAL_TABLET | ORAL | Status: DC | PRN

## 2018-09-26 MED ORDER — ONDANSETRON 8 MG PO TBDP
8.0000 mg | ORAL_TABLET | Freq: Once | ORAL | Status: AC
  Administered 2018-09-26: 8 mg via ORAL
  Filled 2018-09-26: qty 1

## 2018-09-26 MED ORDER — FENTANYL CITRATE (PF) 100 MCG/2ML IJ SOLN
INTRAMUSCULAR | Status: AC
  Administered 2018-09-26: 100 ug via INTRAVENOUS
  Filled 2018-09-26: qty 2

## 2018-09-26 MED ORDER — LACTATED RINGERS IV SOLN
INTRAVENOUS | Status: DC
  Administered 2018-09-26 (×2): 125 mL/h via INTRAVENOUS
  Administered 2018-09-26: 15:00:00 via INTRAVENOUS
  Administered 2018-09-27: 125 mL/h via INTRAVENOUS
  Administered 2018-09-27: 08:00:00 via INTRAVENOUS

## 2018-09-26 MED ORDER — OXYCODONE-ACETAMINOPHEN 5-325 MG PO TABS: 2.0000 | ORAL_TABLET | ORAL | Status: DC | PRN

## 2018-09-26 MED ORDER — PROMETHAZINE HCL 25 MG/ML IJ SOLN
12.5000 mg | Freq: Once | INTRAMUSCULAR | Status: AC
  Administered 2018-09-26: 12.5 mg via INTRAVENOUS
  Filled 2018-09-26: qty 1

## 2018-09-26 NOTE — MAU Provider Note (Signed)
Chief Complaint:  Contractions and Rupture of Membranes   First Provider Initiated Contact with Patient 09/26/18 [phone]      HPI: [name] is a 37 y.o. [name] at [redacted]w[redacted]d with hx PTD, A1GDM, and GHTN this pregnancy who presents to maternity admissions reporting leaking of fluid since early this morning and contractions starting last night.  She also reports headache starting this morning and some "sparkles" or spots in her vision. She took Tylenol 1000 mg at 7 am but her headache is unchanged and vision changes persist.  She reports the pain started as intermittent back pain and low abdominal pain, then became constant pelvic pressure, rectal pressure, and vaginal burning by this morning.  She tried sitting in a warm bath but the pain worsened.   She was recently admitted to the hospital on 10/3 for PPROM with positive amnisure, but on reexamination she had normal AFI, palpable bag of water, and repeat amnisure was negative. She was discharged from [facility] on 10/3. She has presented to MAU six times since 08/23/18.  There are no other symptoms. She has not tried any other treatments. She reports good fetal movement.   HPI  Past Medical History: Past Medical History:  Diagnosis Date  . Diabetes mellitus without complication (HCC)   . GERD (gastroesophageal reflux disease)   . History of cocaine use   . Hypertension   . Kidney stones   . Ovarian cyst   . Pregnancy induced hypertension     Past obstetric history: OB History  Gravida Para Term Preterm AB Living  6 5 4 1   5   SAB TAB Ectopic Multiple Live Births        0 5    # Outcome Date GA Lbr Len/2nd Weight Sex Delivery Anes PTL Lv  6 Current           5 Preterm 01/13/16 [redacted]w[redacted]d 05:59 / 00:04 2310 g M Vag-Spont None  LIV  4 Term 01/10/14 [redacted]w[redacted]d 09:00 / 04:13 3496 g M Vag-Spont EPI  LIV     Birth Comments: Mom 72 y/o G4P4. Labs neg. Gestational HTN on [place]. Baby 37 w, NSVD, L hydronephrosis which was normal at 36  w U/S. Had phototherapy due to 10 bili at 24 hrs. Remained at 10. Requested repeat today.  Mom O+/ baby O+.   3 Term 01/10/06 [redacted]w[redacted]d  3402 g F Vag-Spont EPI  LIV  2 Term 09/02/01 [redacted]w[redacted]d  4139 g M Vag-Spont EPI  LIV  1 Term 01/24/97 [redacted]w[redacted]d  2835 g F Vag-Spont EPI  LIV    Past Surgical History: Past Surgical History:  Procedure Laterality Date  . APPENDECTOMY    . HEMORRHOID SURGERY N/A 01/19/2016   Procedure: EXTENSIVE HEMORRHOIDECTOMY;  Surgeon: Franky Macho, MD;  Location: [facility];  Service: General;  Laterality: N/A;  . kidney stents    . LITHOTRIPSY      Family History: Family History  Problem Relation Age of Onset  . Cancer Mother        breast  . Diabetes Maternal Grandmother     Social History: Social History   Tobacco Use  . Smoking status: Never Smoker  . Smokeless tobacco: Never Used  Substance Use Topics  . Alcohol use: No  . Drug use: No    Comment: Hx Cocaine use    Allergies: No Known Allergies  Meds:  Facility-Administered Medications Prior to Admission  Medication Dose Route Frequency Provider Last Rate Last Dose  . HYDROXYprogesterone Caproate SOAJ  275 mg  275 mg Subcutaneous Weekly Denney, Rachelle A, CNM   275 mg at 09/21/18 1610   Medications Prior to Admission  Medication Sig Dispense Refill Last Dose  . acetaminophen (TYLENOL) 325 MG tablet Take 650 mg by mouth every 6 (six) hours as needed for mild pain or headache.    Past Week at Unknown time  . calcium carbonate (TUMS - DOSED IN MG ELEMENTAL CALCIUM) 500 MG chewable tablet Chew 2 tablets by mouth daily as needed for indigestion or heartburn.   Past Week at Unknown time  . Doxylamine-Pyridoxine (DICLEGIS) 10-10 MG TBEC Take 1 tablet by mouth 2 (two) times daily as needed (nausea/vomiting).   09/23/2018 at Unknown time  . Elastic Bandages & Supports (COMFORT FIT MATERNITY SUPP LG) MISC 1 Units by Does not apply route daily. 1 each 0 Taking  . NIFEdipine (PROCARDIA-XL/ADALAT CC) 30 MG 24 hr tablet  Take 1 tablet (30 mg total) by mouth 2 (two) times daily. 60 tablet 1 09/23/2018 at 0030  . Prenatal Vit-Fe Fumarate-FA (PRENATAL MULTIVITAMIN) TABS tablet Take 1 tablet by mouth daily at 12 noon.   09/23/2018 at Unknown time    ROS:  Review of Systems  Constitutional: Negative for chills, fatigue and fever.  Eyes: Positive for visual disturbance.  Respiratory: Negative for shortness of breath.   Cardiovascular: Negative for chest pain.  Gastrointestinal: Positive for abdominal pain. Negative for nausea and vomiting.  Genitourinary: Positive for pelvic pain, vaginal discharge and vaginal pain. Negative for difficulty urinating, dysuria, flank pain and vaginal bleeding.  Neurological: Positive for headaches. Negative for dizziness.  Psychiatric/Behavioral: Negative.      I have reviewed patient's Past Medical Hx, Surgical Hx, Family Hx, Social Hx, medications and allergies.   Physical Exam   Patient Vitals for the past 24 hrs:  BP Temp Temp src Pulse Resp SpO2 Weight  09/26/18 0958 128/80 - - 94 - - -  09/26/18 0950 (!) 114/94 - - 87 - - -  09/26/18 0931 124/74 - - 94 - - -  09/26/18 0917 115/69 - - 96 - - -  09/26/18 0910 121/70 - - 96 - 99 % -  09/26/18 0843 (!) 146/86 98.2 F (36.8 C) Oral (!) 101 19 100 % 82.6 kg   Constitutional: Well-developed, well-nourished female in no acute distress.  Cardiovascular: normal rate Respiratory: normal effort GI: Abd soft, non-tender, gravid appropriate for gestational age.  MS: Extremities nontender, no edema, normal ROM Neurologic: Alert and oriented x 4.  GU: Neg CVAT.  PELVIC EXAM: Cervix pink, visually closed, without lesion, small amount mucus discharge, pooling negative, ferning slide and amnisure collected, vaginal walls and external genitalia normal   Dilation: 5 Effacement (%): 50 Cervical Position: Posterior Presentation: Vertex Exam by:: Sharen Counter CNM   FHT:  Baseline 145 , moderate variability, accelerations  present, no decelerations Contractions: irregular q 2-7 mins   Labs: Results for orders placed or performed during the hospital encounter of 09/26/18 (from the past 24 hour(s))  Amnisure rupture of membrane (rom)not at [facility]     Status: None   Collection Time: 09/26/18  9:12 AM  Result Value Ref Range   Amnisure ROM POSITIVE    --/--/O POS (10/03 0256)  Imaging:    MAU Course/MDM: NST reviewed and reactive CBC, CMP, P/C ratio, UA, amnisure, ferning Amnisure positive, ferning, pooling negative [place] with normal AFI Cervix unchanged in MAU Headache persists despite treatment with Fioricet (4 hours after pt Tylenol dose at home)  Consult Debroah Loop with presentation, exam findings and test results.  With persistent h/a and ?PPROM, will admit   Assessment: Preterm labor Gestational Hypertension Headache in pregnancy  Plan: Admit to BS See H&P    Sharen Counter Certified Nurse-Midwife 09/26/2018 10:02 AM

## 2018-09-26 NOTE — Progress Notes (Signed)
[name] is a 37 y.o. [id number] at [redacted]w[redacted]d by ultrasound admitted for preterm labor  Subjective:  Pain is in her back Objective: BP 136/75   Pulse 99   Temp 97.8 F (36.6 C) (Oral)   Resp 18   Ht 5\' 3"  (1.6 m)   Wt 82.6 kg   LMP 01/12/2018 (Exact Date)   SpO2 99%   BMI 32.25 kg/m  No intake/output data recorded. No intake/output data recorded.  FHT:  FHR: 140 bpm, variability: moderate,  accelerations:  Present,  decelerations:  Absent UC:   irregular, every 4-6 minutes SVE:   Dilation: 6 Effacement (%): 50 Station: -3 Exam by:: Dr. Debroah Loop  Minimal change 7 cm soft posterior Labs: Lab Results  Component Value Date   WBC 9.0 09/26/2018   HGB 9.7 (L) 09/26/2018   HCT 29.4 (L) 09/26/2018   MCV 88.6 09/26/2018   PLT 239 09/26/2018    Assessment / Plan: premature labor, GHTN  Labor: observation for cervical change Preeclampsia:  no signs or symptoms of toxicity Fetal Wellbeing:  Category I Pain Control:  IV pain meds I/D:  GBS negative Anticipated MOD:  NSVD  [name] 09/26/2018, 7:26 PM

## 2018-09-26 NOTE — H&P (Addendum)
LABOR AND DELIVERY ADMISSION HISTORY AND PHYSICAL NOTE  [name] is a 37 y.o. female [phone] with IUP at [redacted]w[redacted]d by U/S presenting in spontaneous preterm labor. She reports positive fetal movement. She reports a leaking of fluid starting this morning associated with contractions. She took a double dose of her procardia this morning without cessations of contractions. She also endorses a left anterior pulsating HA, denies any change in vision, dizziness, or lightheadedness.   She was recently admitted 10/3-10/3 for suspected PPROM with positive amnisure, however patient had no report of leakage of fluid with AFI 22 and negative ferning. No cervical change was observed during stay. She has had recurrent and persistent contractions, with several evaluations in the MAU over the past few weeks.   Prenatal History/Complications: PNC at [facility]  Pregnancy complications: GHTN, A1GDM, normocytic anemia, history of pre-term labor, history of cocaine use (UDS negative this pregnancy).    Past Medical History: Past Medical History:  Diagnosis Date  . Diabetes mellitus without complication (HCC)   . GERD (gastroesophageal reflux disease)   . History of cocaine use   . Hypertension   . Kidney stones   . Ovarian cyst   . Pregnancy induced hypertension     Past Surgical History: Past Surgical History:  Procedure Laterality Date  . APPENDECTOMY    . HEMORRHOID SURGERY N/A 01/19/2016   Procedure: EXTENSIVE HEMORRHOIDECTOMY;  Surgeon: Franky Macho, MD;  Location: [facility];  Service: General;  Laterality: N/A;  . kidney stents    . LITHOTRIPSY      Obstetrical History: OB History    Gravida  6   Para  5   Term  4   Preterm  1   AB      Living  5     SAB      TAB      Ectopic      Multiple  0   Live Births  5           Social History: Social History   Socioeconomic History  . Marital status: Married    Spouse name: Not on file  . Number of children: Not on  file  . Years of education: Not on file  . Highest education level: Not on file  Occupational History  . Not on file  Social Needs  . Financial resource strain: Not on file  . Food insecurity:    Worry: Not on file    Inability: Not on file  . Transportation needs:    Medical: Not on file    Non-medical: Not on file  Tobacco Use  . Smoking status: Never Smoker  . Smokeless tobacco: Never Used  Substance and Sexual Activity  . Alcohol use: No  . Drug use: No    Comment: Hx Cocaine use  . Sexual activity: Not Currently    Birth control/protection: None  Lifestyle  . Physical activity:    Days per week: Not on file    Minutes per session: Not on file  . Stress: Not on file  Relationships  . Social connections:    Talks on phone: Not on file    Gets together: Not on file    Attends religious service: Not on file    Active member of club or organization: Not on file    Attends meetings of clubs or organizations: Not on file    Relationship status: Not on file  Other Topics Concern  . Not on file  Social History  Narrative  . Not on file    Family History: Family History  Problem Relation Age of Onset  . Cancer Mother        breast  . Diabetes Maternal Grandmother     Allergies: No Known Allergies  Facility-Administered Medications Prior to Admission  Medication Dose Route Frequency Provider Last Rate Last Dose  . HYDROXYprogesterone Caproate SOAJ 275 mg  275 mg Subcutaneous Weekly Denney, Rachelle A, CNM   275 mg at 09/21/18 1610   Medications Prior to Admission  Medication Sig Dispense Refill Last Dose  . acetaminophen (TYLENOL) 325 MG tablet Take 650 mg by mouth every 6 (six) hours as needed for mild pain or headache.    09/26/2018 at Unknown time  . calcium carbonate (TUMS - DOSED IN MG ELEMENTAL CALCIUM) 500 MG chewable tablet Chew 2 tablets by mouth daily as needed for indigestion or heartburn.   09/25/2018 at Unknown time  . Doxylamine-Pyridoxine (DICLEGIS)  10-10 MG TBEC Take 1 tablet by mouth 2 (two) times daily as needed (nausea/vomiting).   09/25/2018 at Unknown time  . NIFEdipine (PROCARDIA-XL/ADALAT CC) 30 MG 24 hr tablet Take 1 tablet (30 mg total) by mouth 2 (two) times daily. 60 tablet 1 09/26/2018 at 0700  . Prenatal Vit-Fe Fumarate-FA (PRENATAL MULTIVITAMIN) TABS tablet Take 1 tablet by mouth daily at 12 noon.   09/25/2018 at Unknown time  . Elastic Bandages & Supports (COMFORT FIT MATERNITY SUPP LG) MISC 1 Units by Does not apply route daily. (Patient not taking: Reported on 09/26/2018) 1 each 0 Not Taking at Unknown time     Review of Systems  All systems reviewed and negative except as stated in HPI  Physical Exam Blood pressure 139/68, pulse 93, temperature 97.8 F (36.6 C), temperature source Oral, resp. rate 18, height 5\' 3"  (1.6 m), weight 82.6 kg, last menstrual period 01/12/2018, SpO2 99 %. General appearance: alert, oriented, NAD Lungs: normal respiratory effort Heart: regular rate Abdomen: soft, non-tender; gravid, FH appropriate for GA Extremities: No calf swelling or tenderness Presentation: cephalic Fetal monitoring: 130's/moderate var/+ accel/-decel Uterine activity: Irregular, every 2-7 minutes  Dilation: 6 Effacement (%): 50 Station: -3 Exam by:: Dr. Debroah Loop   Prenatal labs: ABO, Rh: --/--/O POS (10/05 9604) Antibody: NEG (10/05 5409) Rubella: 1.66 (04/16 1115) RPR: Non Reactive (10/03 0256)  HBsAg: Negative (04/16 1115)  HIV: Non Reactive (08/20 1122)  GC/Chlamydia: Negative  GBS:   Negative  Genetic screening:  NIPS low risk, AFP negative Anatomy US: Normal   Prenatal Transfer Tool  Maternal Diabetes: Yes:  Diabetes Type:  Diet controlled Genetic Screening: Normal Maternal Ultrasounds/Referrals: Normal Fetal Ultrasounds or other Referrals:  Fetal echo, initial concern for perimembranous, but fetal echo normal.  Maternal Substance Abuse:  No History of cocaine in 2017.  Significant Maternal Medications:   Meds include: Other: Procardia and caproate  Significant Maternal Lab Results: Lab values include: Group B Strep negative  Results for orders placed or performed during the hospital encounter of 09/26/18 (from the past 24 hour(s))  Amnisure rupture of membrane (rom)not at [facility]   Collection Time: 09/26/18  9:12 AM  Result Value Ref Range   Amnisure ROM POSITIVE   Protein / creatinine ratio, urine   Collection Time: 09/26/18  9:13 AM  Result Value Ref Range   Creatinine, Urine 78.00 mg/dL   Total Protein, Urine 13 mg/dL   Protein Creatinine Ratio 0.17 (H) 0.00 - 0.15 mg/mg[Cre]  Fern Test   Collection Time: 09/26/18  9:20 AM  Result Value Ref Range   POCT Fern Test Negative = intact amniotic membranes   Type and screen [facility] [place]   Collection Time: 09/26/18  9:55 AM  Result Value Ref Range   ABO/RH(D) O POS    Antibody Screen NEG    Sample Expiration      09/29/2018 Performed at [facility], [address], [place], [place] [address]   CBC   Collection Time: 09/26/18  9:56 AM  Result Value Ref Range   WBC 9.0 4.0 - 10.5 K/uL   RBC 3.32 (L) 3.87 - 5.11 MIL/uL   Hemoglobin 9.7 (L) 12.0 - 15.0 g/dL   HCT 02.7 (L) 25.3 - 66.4 %   MCV 88.6 78.0 - 100.0 fL   MCH 29.2 26.0 - 34.0 pg   MCHC 33.0 30.0 - 36.0 g/dL   RDW 40.3 47.4 - 25.9 %   Platelets 239 150 - 400 K/uL  Comprehensive metabolic panel   Collection Time: 09/26/18  9:56 AM  Result Value Ref Range   Sodium 136 135 - 145 mmol/L   Potassium 3.3 (L) 3.5 - 5.1 mmol/L   Chloride 107 98 - 111 mmol/L   CO2 20 (L) 22 - 32 mmol/L   Glucose, Bld 85 70 - 99 mg/dL   BUN 7 6 - 20 mg/dL   Creatinine, Ser 5.63 0.44 - 1.00 mg/dL   Calcium 8.2 (L) 8.9 - 10.3 mg/dL   Total Protein 6.4 (L) 6.5 - 8.1 g/dL   Albumin 2.8 (L) 3.5 - 5.0 g/dL   AST 27 15 - 41 U/L   ALT 23 0 - 44 U/L   Alkaline Phosphatase 72 38 - 126 U/L   Total Bilirubin 0.2 (L) 0.3 - 1.2 mg/dL   GFR calc non Af Amer >60 >60 mL/min   GFR  calc Af Amer >60 >60 mL/min   Anion gap 9 5 - 15    Patient Active Problem List   Diagnosis Date Noted  . Preterm labor 09/26/2018  . Preterm premature rupture of membranes (PPROM) with onset of labor after 24 hours of rupture in first trimester, antepartum 09/24/2018  . Gestational hypertension, third trimester 09/21/2018  . Threatened preterm labor 09/10/2018  . Unwanted fertility 08/11/2018  . Preterm contractions 08/05/2018  . History of cocaine use 07/28/2018  . Kidney stones 07/07/2018  . History of preterm delivery, currently pregnant in second trimester 06/30/2018  . Supervision of high risk pregnancy, antepartum 04/07/2018  . Advanced maternal age in multigravida 04/07/2018  . Prediabetes   . HLD (hyperlipidemia)   . TIA (transient ischemic attack) 07/30/2016  . Acute left-sided weakness 07/30/2016  . Cocaine use 01/16/2016  . Gestational diabetes mellitus, class A1 12/20/2015  . Anemia 09/11/2015  . Right ovarian cyst 08/30/2015  . HTN (hypertension) 01/09/2014    Assessment: [name] is a 37 y.o. [id number] at [redacted]w[redacted]d here for suspected spontaneous active preterm labor. Pregnancy complicated by GHTN, A1GDM, normocytic anemia, history of pre-term labor, and history of cocaine use (UDS negative this pregnancy). GBS negative. Previously received BMZ  9/19-20.  #Labor: Expectant management, serial cervical checks. ?ROM as pt reports leakage of fluid this am with amnisure positive, but negative ferning.  #Pain: Requests Epidural  #FWB: Cat 1 strip  #ID: GBS negative  #MOF: Breastfeeding/bottle #MOC: BTL, papers signed 8/20 #Circ: N/A  1. Gestational hypertension: Stable. Max BP 146/86, generally ranging around 120-130's systolic. Cr, LFTs, and plts wnl. Protein/cr ratio 0.17.   -Monitor BP; Labetalol  if SBP >160, DBP >110   -Tylenol PRN for HA   Allayne Stack  Family Medicine PGY-1  09/26/2018, 4:09 PM   OB FELLOW HISTORY AND PHYSICAL ATTESTATION  I  have seen and examined this patient; I agree with above documentation in the resident's note.   Marcy Siren, D.O. OB Fellow  09/26/2018, 8:16 PM

## 2018-09-26 NOTE — MAU Note (Addendum)
[name] is a 37 y.o. at [redacted]w[redacted]d here in MAU reporting: +contractions +LOF; patient states she has been leaking since Thursday; endorses having one swab that was + and one - at her visit +vaginal pressure and burning; feels like she needs to have a BM  Onset of complaint: ongoing since Thursday but has gotten worse through the night Pain score: 9/10 Patient reports last VE was 5cm Vitals:   09/26/18 0843  BP: (!) 146/86  Pulse: (!) 101  Resp: 19  Temp: 98.2 F (36.8 C)  SpO2: 100%    Lisa CNM in dept and aware of BP  Lab orders placed from triage: none

## 2018-09-27 ENCOUNTER — Encounter (HOSPITAL_COMMUNITY): Payer: Self-pay | Admitting: Emergency Medicine

## 2018-09-27 LAB — RPR: RPR: NONREACTIVE

## 2018-09-27 LAB — GLUCOSE, CAPILLARY: Glucose-Capillary: 72 mg/dL (ref 70–99)

## 2018-09-27 MED ORDER — CALCIUM CARBONATE ANTACID 500 MG PO CHEW
2.0000 | CHEWABLE_TABLET | ORAL | Status: DC | PRN
  Administered 2018-09-27 – 2018-09-28 (×3): 400 mg via ORAL
  Filled 2018-09-27 (×3): qty 2

## 2018-09-27 MED ORDER — PRENATAL MULTIVITAMIN CH
1.0000 | ORAL_TABLET | Freq: Every day | ORAL | Status: DC
  Administered 2018-09-27 – 2018-09-28 (×2): 1 via ORAL
  Filled 2018-09-27 (×2): qty 1

## 2018-09-27 MED ORDER — ACETAMINOPHEN 325 MG PO TABS
650.0000 mg | ORAL_TABLET | ORAL | Status: DC | PRN
  Administered 2018-09-27 – 2018-09-28 (×2): 650 mg via ORAL
  Filled 2018-09-27 (×2): qty 2

## 2018-09-27 MED ORDER — ONDANSETRON 8 MG PO TBDP
8.0000 mg | ORAL_TABLET | Freq: Three times a day (TID) | ORAL | Status: DC | PRN
  Administered 2018-09-27: 8 mg via ORAL
  Filled 2018-09-27 (×2): qty 1

## 2018-09-27 MED ORDER — ZOLPIDEM TARTRATE 5 MG PO TABS
5.0000 mg | ORAL_TABLET | Freq: Every evening | ORAL | Status: DC | PRN
  Administered 2018-09-27: 5 mg via ORAL
  Filled 2018-09-27: qty 1

## 2018-09-27 MED ORDER — DOCUSATE SODIUM 100 MG PO CAPS
100.0000 mg | ORAL_CAPSULE | Freq: Every day | ORAL | Status: DC
  Administered 2018-09-27 – 2018-09-28 (×2): 100 mg via ORAL
  Filled 2018-09-27 (×2): qty 1

## 2018-09-27 NOTE — Progress Notes (Signed)
LABOR PROGRESS NOTE  [name] is a 37 y.o. [id number] at [redacted]w[redacted]d  admitted for observation for preterm labor.   Subjective: Patient reports continued crampy-abdominal pain but is otherwise doing well. Patient hopeful that at next cervical check she will have made some change.   Objective: BP 130/73   Pulse 95   Temp 98.2 F (36.8 C) (Oral)   Resp 20   Ht 5\' 3"  (1.6 m)   Wt 82.6 kg   LMP 01/12/2018 (Exact Date)   SpO2 99%   BMI 32.25 kg/m  or  Vitals:   09/26/18 1933 09/26/18 2056 09/26/18 2215 09/26/18 2321  BP: 136/77 135/77 100/68 130/73  Pulse: (!) 110 93 90 95  Resp: (!) 24 20 (!) 22 20  Temp: 98.9 F (37.2 C)   98.2 F (36.8 C)  TempSrc: Oral   Oral  SpO2:      Weight:      Height:        Dilation: 5.5 Effacement (%): 50 Cervical Position: Posterior Station: -3 Presentation: Vertex Exam by:: [name],rnc FHT: baseline rate 130, moderate varibility, +acel, -decel Toco: irregular, q3-16min  Labs: Lab Results  Component Value Date   WBC 9.0 09/26/2018   HGB 9.7 (L) 09/26/2018   HCT 29.4 (L) 09/26/2018   MCV 88.6 09/26/2018   PLT 239 09/26/2018    Patient Active Problem List   Diagnosis Date Noted  . Preterm labor 09/26/2018  . Preterm premature rupture of membranes (PPROM) with onset of labor after 24 hours of rupture in first trimester, antepartum 09/24/2018  . Gestational hypertension, third trimester 09/21/2018  . Threatened preterm labor 09/10/2018  . Unwanted fertility 08/11/2018  . Preterm contractions 08/05/2018  . History of cocaine use 07/28/2018  . Kidney stones 07/07/2018  . History of preterm delivery, currently pregnant in second trimester 06/30/2018  . Supervision of high risk pregnancy, antepartum 04/07/2018  . Advanced maternal age in multigravida 04/07/2018  . Prediabetes   . HLD (hyperlipidemia)   . TIA (transient ischemic attack) 07/30/2016  . Acute left-sided weakness 07/30/2016  . Cocaine use 01/16/2016  .  Gestational diabetes mellitus, class A1 12/20/2015  . Anemia 09/11/2015  . Right ovarian cyst 08/30/2015  . HTN (hypertension) 01/09/2014    Assessment / Plan: 37 y.o. [id number] at [redacted]w[redacted]d here for observation of preterm labor  Labor: observation for cervical change  Fetal Wellbeing:  Cat 1 Pain Control:  IV pain medications  Anticipated MOD:  NSVD   [name], DO PGY-2 09/27/2018, 1:14 AM

## 2018-09-27 NOTE — Progress Notes (Signed)
LABOR PROGRESS NOTE  [name] is a 37 y.o. [id number] at [redacted]w[redacted]d  admitted for observation for preterm labor  Subjective: Patient still reporting discomfort. Able to speak through contractions. Will attempt to try heating bad   Objective: BP 122/61   Pulse 97   Temp 98 F (36.7 C) (Oral)   Resp 18   Ht 5\' 3"  (1.6 m)   Wt 82.6 kg   LMP 01/12/2018 (Exact Date)   SpO2 99%   BMI 32.25 kg/m  or  Vitals:   09/26/18 2056 09/26/18 2215 09/26/18 2321 09/27/18 0243  BP: 135/77 100/68 130/73 122/61  Pulse: 93 90 95 97  Resp: 20 (!) 22 20 18   Temp:   98.2 F (36.8 C) 98 F (36.7 C)  TempSrc:   Oral Oral  SpO2:      Weight:      Height:        Dilation: 5.5 Effacement (%): 50 Cervical Position: Posterior Station: -3 Presentation: Vertex Exam by:: katie forsell,rnc FHT: baseline rate 135, moderate varibility, +acel, -decel Toco: irregular, q2-4 min  Labs: Lab Results  Component Value Date   WBC 9.0 09/26/2018   HGB 9.7 (L) 09/26/2018   HCT 29.4 (L) 09/26/2018   MCV 88.6 09/26/2018   PLT 239 09/26/2018    Patient Active Problem List   Diagnosis Date Noted  . Preterm labor 09/26/2018  . Preterm premature rupture of membranes (PPROM) with onset of labor after 24 hours of rupture in first trimester, antepartum 09/24/2018  . Gestational hypertension, third trimester 09/21/2018  . Threatened preterm labor 09/10/2018  . Unwanted fertility 08/11/2018  . Preterm contractions 08/05/2018  . History of cocaine use 07/28/2018  . Kidney stones 07/07/2018  . History of preterm delivery, currently pregnant in second trimester 06/30/2018  . Supervision of high risk pregnancy, antepartum 04/07/2018  . Advanced maternal age in multigravida 04/07/2018  . Prediabetes   . HLD (hyperlipidemia)   . TIA (transient ischemic attack) 07/30/2016  . Acute left-sided weakness 07/30/2016  . Cocaine use 01/16/2016  . Gestational diabetes mellitus, class A1 12/20/2015  . Anemia  09/11/2015  . Right ovarian cyst 08/30/2015  . HTN (hypertension) 01/09/2014    Assessment / Plan: 37 y.o. [id number] at [redacted]w[redacted]d here for observation of preterm labor   Labor: observation for cervical change Fetal Wellbeing:  Cat 1 Pain Control:  IV pain medications  Anticipated MOD:  NSVD  [name], DO PGY-2 09/27/2018, 4:30 AM

## 2018-09-27 NOTE — Progress Notes (Signed)
LABOR PROGRESS NOTE  [name] is a 37 y.o. [id number] at [redacted]w[redacted]d  admitted for observation for preterm labor  Subjective: Patient still reporting discomfort. Hopeful for cervical change  Objective: BP 107/73   Pulse 90   Temp 98 F (36.7 C) (Oral)   Resp 16   Ht 5\' 3"  (1.6 m)   Wt 82.6 kg   LMP 01/12/2018 (Exact Date)   SpO2 99%   BMI 32.25 kg/m  or  Vitals:   09/27/18 0730 09/27/18 0803 09/27/18 0832 09/27/18 0902  BP: 118/63 127/72 135/67 107/73  Pulse: 99 96 (!) 103 90  Resp: 18 16 16 16   Temp: 98 F (36.7 C)     TempSrc: Oral     SpO2:      Weight:      Height:        Dilation: 6 Effacement (%): 50 Cervical Position: Middle Station: Ballotable Presentation: Vertex Exam by:: katie forsell,rnc FHT: baseline rate 130, moderate varibility, +acel, -decel Toco: irregular, q2-4 min  Labs: Lab Results  Component Value Date   WBC 9.0 09/26/2018   HGB 9.7 (L) 09/26/2018   HCT 29.4 (L) 09/26/2018   MCV 88.6 09/26/2018   PLT 239 09/26/2018    Patient Active Problem List   Diagnosis Date Noted  . Preterm labor 09/26/2018  . Preterm premature rupture of membranes (PPROM) with onset of labor after 24 hours of rupture in first trimester, antepartum 09/24/2018  . Gestational hypertension, third trimester 09/21/2018  . Threatened preterm labor 09/10/2018  . Unwanted fertility 08/11/2018  . Preterm contractions 08/05/2018  . History of cocaine use 07/28/2018  . Kidney stones 07/07/2018  . History of preterm delivery, currently pregnant in second trimester 06/30/2018  . Supervision of high risk pregnancy, antepartum 04/07/2018  . Advanced maternal age in multigravida 04/07/2018  . Prediabetes   . HLD (hyperlipidemia)   . TIA (transient ischemic attack) 07/30/2016  . Acute left-sided weakness 07/30/2016  . Cocaine use 01/16/2016  . Gestational diabetes mellitus, class A1 12/20/2015  . Anemia 09/11/2015  . Right ovarian cyst 08/30/2015  . HTN  (hypertension) 01/09/2014    Assessment / Plan: 37 y.o. [id number] at [redacted]w[redacted]d here for observation of preterm labor   Labor: observation for cervical change, will plan to admit to ANTE for continued observation  Fetal Wellbeing:  Cat 1 Pain Control:  IV pain medications  Anticipated MOD:  NSVD  [name], DO PGY-2 09/27/2018, 9:15 AM

## 2018-09-28 ENCOUNTER — Encounter: Payer: Medicaid Other | Admitting: Advanced Practice Midwife

## 2018-09-28 ENCOUNTER — Other Ambulatory Visit: Payer: Medicaid Other

## 2018-09-28 ENCOUNTER — Encounter (HOSPITAL_COMMUNITY): Payer: Self-pay | Admitting: Obstetrics & Gynecology

## 2018-09-28 LAB — GLUCOSE, CAPILLARY: Glucose-Capillary: 74 mg/dL (ref 70–99)

## 2018-09-28 MED ORDER — OXYCODONE-ACETAMINOPHEN 5-325 MG PO TABS
2.0000 | ORAL_TABLET | Freq: Once | ORAL | Status: AC
  Administered 2018-09-28: 2 via ORAL
  Filled 2018-09-28: qty 2

## 2018-09-28 MED ORDER — PROMETHAZINE HCL 25 MG/ML IJ SOLN
25.0000 mg | Freq: Four times a day (QID) | INTRAMUSCULAR | Status: DC | PRN
  Administered 2018-09-28: 25 mg via INTRAVENOUS
  Filled 2018-09-28: qty 1

## 2018-09-28 NOTE — Discharge Summary (Signed)
Antenatal Physician Discharge Summary  Patient ID: [name] MRN: [id number] DOB/AGE: [birth year] 38 y.o.  Admit date: 09/26/2018 Discharge date: 09/28/2018  Admission Diagnoses: Threatened preterm labor in the third trimester; Gestational hypertension  Discharge Diagnoses: The same  Prenatal Procedures: NST  Hospital Course:  This is a 37 y.o. [id number] with IUP at [redacted]w[redacted]d admitted for concern about PTL. Patient had multiple admissions for this indication.  Was ruled out for PPROM, had false positive Amnisure. For this admission, she received an epidural again and was observed on L&D again, never had significant cervical change.  Category I FHR tracing.  After 24 hours, she was transferred to Antepartum and the plan was to keep her inpatient until delivery at 37 weeks (Gestational Hypertension) or earlier if needed.  However, on HD#3, patient desired to go home.  She felt that no intervention is being done for her.  Reported rare contractions; had Category I FHR tracing and stable BP.  She was checked and had unchanged cervical exam. Wanted to go and be with her family and  "sleep in own bed".  Warned about her multiple admissions, risk of re-admission etc but she still wanted to leave. Discharge instructions were given and she was discharged to home. Has office follow up this week on 10/01/18.   Discharge Exam: Temp:  [98.1 F (36.7 C)-98.4 F (36.9 C)] 98.3 F (36.8 C) (10/07 1136) Pulse Rate:  [85-96] 93 (10/07 1136) Resp:  [17-18] 18 (10/07 1136) BP: (108-114)/(58-68) 114/68 (10/07 1136) SpO2:  [97 %-100 %] 99 % (10/07 1136) Physical Examination: CONSTITUTIONAL: Well-developed, well-nourished female in no acute distress.  HENT:  Normocephalic, atraumatic, External right and left ear normal. Oropharynx is clear and moist EYES: Conjunctivae and EOM are normal. Pupils are equal, round, and reactive to light. No scleral icterus.  NECK: Normal range of motion, supple, no  masses SKIN: Skin is warm and dry. No rash noted. Not diaphoretic. No erythema. No pallor. NEUROLGIC: Alert and oriented to person, place, and time. Normal reflexes, muscle tone coordination. No cranial nerve deficit noted. PSYCHIATRIC: Normal mood and affect. Normal behavior. Normal judgment and thought content. CARDIOVASCULAR: Normal heart rate noted, regular rhythm RESPIRATORY: Effort and breath sounds normal, no problems with respiration noted MUSCULOSKELETAL: Normal range of motion. No edema and no tenderness. 2+ distal pulses. ABDOMEN: Soft, nontender, nondistended, gravid. CERVIX: Dilation: 4 Effacement (%): 50 Cervical Position: Posterior Station: Ballotable Presentation: Vertex Exam by:: Addi Pugh RN  Fetal monitoring: FHR: 130 bpm, Variability: moderate, Accelerations: Present, Decelerations: Absent  Uterine activity: 1-2 contractions per hour  Significant Diagnostic Studies:  Results for orders placed or performed during the hospital encounter of 09/26/18 (from the past 168 hour(s))  Amnisure rupture of membrane (rom)not at [facility]   Collection Time: 09/26/18  9:12 AM  Result Value Ref Range   Amnisure ROM POSITIVE   Protein / creatinine ratio, urine   Collection Time: 09/26/18  9:13 AM  Result Value Ref Range   Creatinine, Urine 78.00 mg/dL   Total Protein, Urine 13 mg/dL   Protein Creatinine Ratio 0.17 (H) 0.00 - 0.15 mg/mg[Cre]  Fern Test   Collection Time: 09/26/18  9:20 AM  Result Value Ref Range   POCT Fern Test Negative = intact amniotic membranes   RPR   Collection Time: 09/26/18  9:55 AM  Result Value Ref Range   RPR Ser Ql Non Reactive Non Reactive  Type and screen [facility] [place]   Collection Time: 09/26/18  9:55 AM  Result  Value Ref Range   ABO/RH(D) O POS    Antibody Screen NEG    Sample Expiration      09/29/2018 Performed at [facility], [address] [place], [place] [address]   CBC   Collection Time: 09/26/18  9:56 AM   Result Value Ref Range   WBC 9.0 4.0 - 10.5 K/uL   RBC 3.32 (L) 3.87 - 5.11 MIL/uL   Hemoglobin 9.7 (L) 12.0 - 15.0 g/dL   HCT 60.4 (L) 54.0 - 98.1 %   MCV 88.6 78.0 - 100.0 fL   MCH 29.2 26.0 - 34.0 pg   MCHC 33.0 30.0 - 36.0 g/dL   RDW 19.1 47.8 - 29.5 %   Platelets 239 150 - 400 K/uL  Comprehensive metabolic panel   Collection Time: 09/26/18  9:56 AM  Result Value Ref Range   Sodium 136 135 - 145 mmol/L   Potassium 3.3 (L) 3.5 - 5.1 mmol/L   Chloride 107 98 - 111 mmol/L   CO2 20 (L) 22 - 32 mmol/L   Glucose, Bld 85 70 - 99 mg/dL   BUN 7 6 - 20 mg/dL   Creatinine, Ser 6.21 0.44 - 1.00 mg/dL   Calcium 8.2 (L) 8.9 - 10.3 mg/dL   Total Protein 6.4 (L) 6.5 - 8.1 g/dL   Albumin 2.8 (L) 3.5 - 5.0 g/dL   AST 27 15 - 41 U/L   ALT 23 0 - 44 U/L   Alkaline Phosphatase 72 38 - 126 U/L   Total Bilirubin 0.2 (L) 0.3 - 1.2 mg/dL   GFR calc non Af Amer >60 >60 mL/min   GFR calc Af Amer >60 >60 mL/min   Anion gap 9 5 - 15  Glucose, capillary   Collection Time: 09/26/18  8:07 PM  Result Value Ref Range   Glucose-Capillary 82 70 - 99 mg/dL  Glucose, capillary   Collection Time: 09/27/18 12:57 PM  Result Value Ref Range   Glucose-Capillary 72 70 - 99 mg/dL  Glucose, capillary   Collection Time: 09/28/18  8:24 AM  Result Value Ref Range   Glucose-Capillary 74 70 - 99 mg/dL  Results for orders placed or performed during the hospital encounter of 09/25/18 (from the past 168 hour(s))  Amnisure rupture of membrane (rom)not at [facility]   Collection Time: 09/25/18  5:33 AM  Result Value Ref Range   Amnisure ROM NEGATIVE   Results for orders placed or performed during the hospital encounter of 09/24/18 (from the past 168 hour(s))  Urinalysis, Routine w reflex microscopic   Collection Time: 09/24/18  1:37 AM  Result Value Ref Range   Color, Urine YELLOW YELLOW   APPearance HAZY (A) CLEAR   Specific Gravity, Urine 1.010 1.005 - 1.030   pH 8.0 5.0 - 8.0   Glucose, UA NEGATIVE NEGATIVE mg/dL    Hgb urine dipstick NEGATIVE NEGATIVE   Bilirubin Urine NEGATIVE NEGATIVE   Ketones, ur NEGATIVE NEGATIVE mg/dL   Protein, ur NEGATIVE NEGATIVE mg/dL   Nitrite NEGATIVE NEGATIVE   Leukocytes, UA LARGE (A) NEGATIVE   RBC / HPF 0-5 0 - 5 RBC/hpf   WBC, UA >50 (H) 0 - 5 WBC/hpf   Bacteria, UA RARE (A) NONE SEEN   Squamous Epithelial / LPF 0-5 0 - 5   Mucus PRESENT   Fern Test   Collection Time: 09/24/18  1:53 AM  Result Value Ref Range   POCT Fern Test Negative = intact amniotic membranes (NE)   Amnisure rupture of membrane (rom)not  at [facility]   Collection Time: 09/24/18  2:12 AM  Result Value Ref Range   Amnisure ROM POSITIVE   CBC   Collection Time: 09/24/18  2:56 AM  Result Value Ref Range   WBC 11.9 (H) 4.0 - 10.5 K/uL   RBC 3.68 (L) 3.87 - 5.11 MIL/uL   Hemoglobin 10.7 (L) 12.0 - 15.0 g/dL   HCT 16.1 (L) 09.6 - 04.5 %   MCV 87.2 78.0 - 100.0 fL   MCH 29.1 26.0 - 34.0 pg   MCHC 33.3 30.0 - 36.0 g/dL   RDW 40.9 81.1 - 91.4 %   Platelets 235 150 - 400 K/uL  RPR   Collection Time: 09/24/18  2:56 AM  Result Value Ref Range   RPR Ser Ql Non Reactive Non Reactive  Type and screen [facility] [place]   Collection Time: 09/24/18  2:56 AM  Result Value Ref Range   ABO/RH(D) O POS    Antibody Screen NEG    Sample Expiration      09/27/2018 Performed at [facility], [address] [place], [place] [address]   Protein / creatinine ratio, urine   Collection Time: 09/24/18 10:23 AM  Result Value Ref Range   Creatinine, Urine 90.00 mg/dL   Total Protein, Urine 17 mg/dL   Protein Creatinine Ratio 0.19 (H) 0.00 - 0.15 mg/mg[Cre]  Comprehensive metabolic panel   Collection Time: 09/24/18 10:48 AM  Result Value Ref Range   Sodium 136 135 - 145 mmol/L   Potassium 3.5 3.5 - 5.1 mmol/L   Chloride 105 98 - 111 mmol/L   CO2 22 22 - 32 mmol/L   Glucose, Bld 89 70 - 99 mg/dL   BUN 8 6 - 20 mg/dL   Creatinine, Ser 6.21 0.44 - 1.00 mg/dL   Calcium 9.3 8.9 - 30.8 mg/dL    Total Protein 6.3 (L) 6.5 - 8.1 g/dL   Albumin 2.7 (L) 3.5 - 5.0 g/dL   AST 24 15 - 41 U/L   ALT 21 0 - 44 U/L   Alkaline Phosphatase 70 38 - 126 U/L   Total Bilirubin 0.3 0.3 - 1.2 mg/dL   GFR calc non Af Amer >60 >60 mL/min   GFR calc Af Amer >60 >60 mL/min   Anion gap 9 5 - 15  Glucose, capillary   Collection Time: 09/24/18  6:27 PM  Result Value Ref Range   Glucose-Capillary 107 (H) 70 - 99 mg/dL   [place] Mfm Ob Limited  Result Date: 09/26/2018 ----------------------------------------------------------------------  OBSTETRICS REPORT                       (Signed Final 09/26/2018 07:53 pm) ---------------------------------------------------------------------- Patient Info  ID #:       [id number]                          D.O.B.:  1981-06-05 (37 yrs)  Name:       Albertha Ghee-              Visit Date: 09/26/2018 11:10 am              TEJEDA ---------------------------------------------------------------------- Performed By  Performed By:     Birdena Crandall        Secondary Phy.:    L&D Nursing- L/D                    RDMS,RVT  Unit [id number]  Attending:        Noralee Space MD        Location:          [facility]  Referred By:      MAU Nursing-                    MAU/Triage ---------------------------------------------------------------------- Orders   #  Description                          Code         Ordered By   1  [place] MFM OB LIMITED                    [address].60     LISA LEFTWICH-                                                        KIRBY  ----------------------------------------------------------------------   #  Order #                    Accession #                 Episode #   1  [id number]                  [id number]                  [id number]  ---------------------------------------------------------------------- Indications   Premature rupture of membranes - leaking       O42.90   fluid - pos amnisure   Abdominal pain in  pregnancy                    O99.89   Gestational diabetes in pregnancy,             O24.419   unspecified control   Hypertension - Gestational                     O13.9   [redacted] weeks gestation of pregnancy                Z3A.35  ---------------------------------------------------------------------- Vital Signs                                                 Height:        5'3" ---------------------------------------------------------------------- Fetal Evaluation  Num Of Fetuses:          1  Fetal Heart Rate(bpm):   154  Cardiac Activity:        Observed  Presentation:            Cephalic  Placenta:                Fundal  P. Cord Insertion:       Previously Visualized  Amniotic Fluid  AFI FV:      Within normal limits  AFI Sum(cm)     %Tile       Largest Pocket(cm)  21.04           79          6.91  RUQ(cm)       RLQ(cm)       LUQ(cm)        LLQ(cm)  6.33          5.01          6.91           2.79  Comment:    Bladder visualized. ---------------------------------------------------------------------- OB History  Gravidity:    6         Term:   4        Prem:   1        SAB:   0  TOP:          0       Ectopic:  0        Living: 5 ---------------------------------------------------------------------- Gestational Age  LMP:           36w 5d        Date:  01/12/18                 EDD:   10/19/18  Best:          35w 1d     Det. By:  U/S C R L  (04/14/18)    EDD:   10/30/18 ---------------------------------------------------------------------- Anatomy  Ventricles:            Appears normal         Kidneys:                Appear normal  Stomach:               Appears normal, left   Bladder:                Appears normal                         sided ---------------------------------------------------------------------- Impression  Patient was evaluated for preterm labor.  A limited ultrasound study was performed. Amniotic fluid is  normal and good fetal activity is seen. Cephalic presentation.  ----------------------------------------------------------------------                  Noralee Space, MD Electronically Signed Final Report   09/26/2018 07:53 pm ----------------------------------------------------------------------  [place] Mfm Ob Limited  Result Date: 09/25/2018 ----------------------------------------------------------------------  OBSTETRICS REPORT                       (Signed Final 09/25/2018 08:29 am) ---------------------------------------------------------------------- Patient Info  ID #:       [id number]                          D.O.B.:  04/23/1981 (37 yrs)  Name:       Albertha Ghee-              Visit Date: 09/25/2018 05:06 am              TEJEDA ---------------------------------------------------------------------- Performed By  Performed By:     Hurman Horn          Secondary Phy.:   L&D Nursing- L/D                    RDMS  Unit [id number]  Attending:        Noralee Space MD        Location:         [facility]  Referred By:      MAU Nursing-                    MAU/Triage ---------------------------------------------------------------------- Orders   #  Description                          Code         Ordered By   1  [place] MFM OB LIMITED                    [address].60     Wynelle Bourgeois  ----------------------------------------------------------------------   #  Order #                    Accession #                 Episode #   1  [id number]                  [id number]                  [id number]  ---------------------------------------------------------------------- Indications   Premature rupture of membranes - leaking       O42.90   fluid - pos amnisure   Gestational diabetes in pregnancy,             O24.419   unspecified control   Hypertension - Gestational                     O13.9   [redacted] weeks gestation of pregnancy                Z3A.35  ---------------------------------------------------------------------- Vital Signs                                                  Height:        5'3" ---------------------------------------------------------------------- Fetal Evaluation  Num Of Fetuses:         1  Fetal Heart Rate(bpm):  149  Cardiac Activity:       Observed  Presentation:           Cephalic  Placenta:               Fundal  P. Cord Insertion:      Visualized  Amniotic Fluid  AFI FV:      Within normal limits  AFI Sum(cm)     %Tile       Largest Pocket(cm)  16.21           59          8.13  RUQ(cm)       RLQ(cm)       LUQ(cm)        LLQ(cm)  8.13          2.98          1.56           3.54  Comment:    Bladder noted. ---------------------------------------------------------------------- OB History  Gravidity:    6         Term:   4  Prem:   1        SAB:   0  TOP:          0       Ectopic:  0        Living: 5 ---------------------------------------------------------------------- Gestational Age  LMP:           36w 4d        Date:  01/12/18                 EDD:   10/19/18  Best:          Consuello Closs 0d     Det. By:  U/S C R L  (04/14/18)    EDD:   10/30/18 ---------------------------------------------------------------------- Impression  Patient was evaluated for suspected rupture of membranes.  A limited ultrasound study was performed. Amniotic fluid is  normal and good fetal activity is seen. ----------------------------------------------------------------------                  Noralee Space, MD Electronically Signed Final Report   09/25/2018 08:29 am ----------------------------------------------------------------------  [place] Mfm Ob Limited  Result Date: 09/24/2018 ----------------------------------------------------------------------  OBSTETRICS REPORT                       (Signed Final 09/24/2018 08:25 am) ---------------------------------------------------------------------- Patient Info  ID #:       [id number]                          D.O.B.:  Oct 23, 1981 (37 yrs)  Name:       SHAWNTE WINTON-              Visit Date: 09/24/2018  02:59 am              TEJEDA ---------------------------------------------------------------------- Performed By  Performed By:     Darlyn Read Phy.:   L&D Nursing- L/D                    RDMS                                                             Unit [id number]  Attending:        Noralee Space MD        Location:         [facility]  Referred By:      MAU Nursing-                    MAU/Triage ---------------------------------------------------------------------- Orders   #  Description                          Code         Ordered By   1  [place] MFM OB LIMITED                    40981.19     Wynelle Bourgeois  ----------------------------------------------------------------------   #  Order #                    Accession #  Episode #   1  [id number]                  [id number]                  [id number]  ---------------------------------------------------------------------- Indications   Premature rupture of membranes - leaking       O42.90   fluid - pos amnisure   Gestational diabetes in pregnancy,             O24.419   unspecified control   Hypertension - Gestational                     O13.9   [redacted] weeks gestation of pregnancy                Z3A.34   Poor obstetric history: Previous preterm       O09.219   delivery, antepartum (33 weeks)   Poor obstetric history: Previous               O09.299   preeclampsia / eclampsia/gestational HTN   Advanced maternal age multigravida 66+,        O48.523   third trimester  ---------------------------------------------------------------------- Fetal Evaluation  Num Of Fetuses:         1  Fetal Heart Rate(bpm):  148  Cardiac Activity:       Observed  Presentation:           Cephalic  Placenta:               Fundal  Amniotic Fluid  AFI FV:      Within normal limits  AFI Sum(cm)     %Tile       Largest Pocket(cm)  22.1            83          8.02  RUQ(cm)       RLQ(cm)       LUQ(cm)        LLQ(cm)  3.68          6.7           3.7            8.02  ---------------------------------------------------------------------- OB History  Gravidity:    6         Term:   4        Prem:   1        SAB:   0  TOP:          0       Ectopic:  0        Living: 5 ---------------------------------------------------------------------- Gestational Age  LMP:           36w 3d        Date:  01/12/18                 EDD:   10/19/18  Best:          34w 6d     Det. By:  U/S C R L  (04/14/18)    EDD:   10/30/18 ---------------------------------------------------------------------- Cervix Uterus Adnexa  Cervix  Not visualized (advanced GA >24wks)  Left Ovary  Within normal limits.  Right Ovary  Simple cyst, measuring 4.8x4.5x4.1cm ---------------------------------------------------------------------- Impression  Patient was evaluted in the MAU for preterm labor.  A limited ultrasound study was performed. Amniotic fluid is  normal and good fetal activity is seen. Cephalic presentation. ----------------------------------------------------------------------  Noralee Space, MD Electronically Signed Final Report   09/24/2018 08:25 am ----------------------------------------------------------------------   Future Appointments  Date Time Provider Department Center  10/01/2018  8:45 AM [facility] ULTRASOUND [facility] None  10/01/2018  9:30 AM Conan Bowens, MD [facility] None  10/05/2018  9:45 AM [facility] NURSE [facility] None  10/08/2018  9:30 AM [facility] ULTRAS[facility] None  10/08/2018 10:15 AM Conan Bowens, MD [facility] None  10/12/2018  9:30 AM [facility] ULTRASOUND [facility] None  10/12/2018 10:30 AM [facility] NURSE [facility] None  10/15/2018  9:30 AM [facility] ULTRAS[facility] None  10/15/2018 10:30 AM Crue Otero, Jethro Bastos, MD [facility] None    Discharge Condition: Stable  Discharge disposition: 01-Home or Self Care       Discharge Instructions    Discharge activity:   Complete by:  As directed    Limited activity   Discharge diet:   Complete by:  As directed     Diabetic diet   Do not have sex or do anything that might make you have an orgasm   Complete by:  As directed    Fetal Kick Count:  Lie on our left side for one hour after a meal, and count the number of times your baby kicks.  If it is less than 5 times, get up, move around and drink some juice.  Repeat the test 30 minutes later.  If it is still less than 5 kicks in an hour, notify your doctor.   Complete by:  As directed    Notify physician for leaking of fluid   Complete by:  As directed    Notify physician for menstrual like cramps   Complete by:  As directed    Notify physician for uterine contractions.  These may be painless and feel like the uterus is tightening or the baby is  "balling up"   Complete by:  As directed    Notify physician for vaginal bleeding   Complete by:  As directed    PRETERM LABOR:  Includes any of the follwing symptoms that occur between 20 - [redacted] weeks gestation.  If these symptoms are not stopped, preterm labor can result in preterm delivery, placing your baby at risk   Complete by:  As directed      Allergies as of 09/28/2018   No Known Allergies     Medication List    TAKE these medications   acetaminophen 325 MG tablet Commonly known as:  TYLENOL Take 650 mg by mouth every 6 (six) hours as needed for mild pain or headache.   calcium carbonate 500 MG chewable tablet Commonly known as:  TUMS - dosed in mg elemental calcium Chew 2 tablets by mouth daily as needed for indigestion or heartburn.   COMFORT FIT MATERNITY SUPP LG Misc 1 Units by Does not apply route daily.   DICLEGIS 10-10 MG Tbec Generic drug:  Doxylamine-Pyridoxine Take 1 tablet by mouth 2 (two) times daily as needed (nausea/vomiting).   NIFEdipine 30 MG 24 hr tablet Commonly known as:  PROCARDIA-XL/ADALAT CC Take 1 tablet (30 mg total) by mouth 2 (two) times daily.   prenatal multivitamin Tabs tablet Take 1 tablet by mouth daily at 12 noon.        Signed: Jaynie Collins  M.D. 09/28/2018, 3:22 PM

## 2018-09-28 NOTE — Progress Notes (Signed)
Patient desires to go home, feels that no intervention is being done for her.  Reports rare contractions; has Category I FHR tracing and stable BP.  Wants to go and be with her family and  "sleep in own bed".  Warned about her multiple admissions, risk of re-admission etc but she still wants to leave. Discharge instructions given and she was discharged to home. Has office follow up this week on 10/01/18.   Jaynie Collins, MD, FACOG Obstetrician & Gynecologist, [facility] for [employer], [facility]

## 2018-09-28 NOTE — Progress Notes (Signed)
Spoke with Misty Stanley, CRNA regarding epidural site pain.

## 2018-09-28 NOTE — Discharge Instructions (Signed)

## 2018-09-28 NOTE — Anesthesia Pain Management Evaluation Note (Signed)
  CRNA Pain Management Visit Note  Called by [name] RN to evaluate pt's back pain. Pt is a 37 year old female who has presented multiple times for pre-term labor. She has received 2 epidurals this pregnancy one on 9/27 and one on 10/3. She is now complaining of a throbbing pain at the epidural site during current contractions.  Pt is neurologically intact. No redness, swelling, or drainage at puncture sites. No fever. Pt reassured that some soreness after an epidural is normal. And to call back if she has worsening symptoms or if she has a change in her neurological status. Findings discussed with [name] and [name] MD.   [name] 09/28/2018

## 2018-09-28 NOTE — Anesthesia Postprocedure Evaluation (Signed)
Anesthesia Post Note  Patient: [name]  Procedure(s) Performed: AN AD HOC LABOR EPIDURAL     Patient location during evaluation: Mother Baby Anesthesia Type: Epidural Level of consciousness: awake and alert Pain management: pain level controlled Vital Signs Assessment: post-procedure vital signs reviewed and stable Respiratory status: spontaneous breathing, nonlabored ventilation and respiratory function stable Cardiovascular status: stable Postop Assessment: no headache, no backache and epidural receding Anesthetic complications: no Comments: Chart review only    Last Vitals: There were no vitals filed for this visit.  Last Pain: There were no vitals filed for this visit. Pain Goal:                 Trevor Iha

## 2018-09-28 NOTE — Progress Notes (Signed)
Discharge instructions given to patient, discussed follow up appointments and signs and symptoms of pre term labor.  No medication changes at this time.  Patient verbalized understanding.

## 2018-09-28 NOTE — Progress Notes (Signed)
Patient ID: [name], female   DOB: 1981/09/18, 37 y.o.   MRN: [id number] FACULTY PRACTICE ANTEPARTUM(COMPREHENSIVE) NOTE  [name] is a 37 y.o. [id number] at [redacted]w[redacted]d by early ultrasound who is admitted for Preterm labor.   Fetal presentation is cephalic. Length of Stay:  2  Days  Subjective: Still contracting infrequently. Has mild nausea. Some pressure intermittently. Mild back pain s/p epidural placement and removal. Patient reports the fetal movement as active. Patient reports uterine contraction  activity as irregular and mild Patient reports  vaginal bleeding as none. Patient describes fluid per vagina as None.  Vitals:  Blood pressure 108/68, pulse 85, temperature 98.4 F (36.9 C), temperature source Oral, resp. rate 17, height 5\' 3"  (1.6 m), weight 82.6 kg, last menstrual period 01/12/2018, SpO2 98 %. Physical Examination: General appearance - in mild to moderate distress Heart - normal rate and regular rhythm Abdomen - soft, nontender, nondistended Fundal Height:  size equals dates Cervical Exam: Dilation: 6 Effacement (%): 50 Cervical Position: Posterior Station: Ballotable Presentation: Vertex Exam by:: Verdie Drown, RN & J Cox, [name] Exam done 09/27/18 Extremities: extremities normal, atraumatic, no cyanosis or edema and Homans sign is negative, no sign of DVT  Membranes:intact  Fetal Monitoring:  Baseline: 145 bpm, Variability: Moderate {> 6 bpm), Accelerations: Reactive and Decelerations: Absent  Labs:  Results for orders placed or performed during the hospital encounter of 09/26/18 (from the past 24 hour(s))  Glucose, capillary   Collection Time: 09/27/18 12:57 PM  Result Value Ref Range   Glucose-Capillary 72 70 - 99 mg/dL  Glucose, capillary   Collection Time: 09/28/18  8:24 AM  Result Value Ref Range   Glucose-Capillary 74 70 - 99 mg/dL     Medications:  Scheduled . docusate sodium  100 mg Oral Daily  . prenatal multivitamin  1  tablet Oral Q1200   I have reviewed the patient's current medications.  ASSESSMENT: Principal Problem:   Preterm labor Active Problems:   History of preeclampsia, prior pregnancy, currently pregnant, third trimester   Gestational diabetes mellitus, class A1   Gestational hypertension, third trimester    PLAN: Continue inpatient observation given multiple admissions Category 1 FHR tracing, continue NST BID and tocometry as needed Phenergan for nausea Stable CBGs Stable BP, no s/sx preeclampsia for now Routine antenatal care  Jaynie Collins, MD 09/28/2018,10:03 AM

## 2018-10-01 ENCOUNTER — Telehealth (HOSPITAL_COMMUNITY): Payer: Self-pay | Admitting: *Deleted

## 2018-10-01 ENCOUNTER — Ambulatory Visit (INDEPENDENT_AMBULATORY_CARE_PROVIDER_SITE_OTHER): Payer: Medicaid Other | Admitting: Obstetrics and Gynecology

## 2018-10-01 ENCOUNTER — Other Ambulatory Visit: Payer: Medicaid Other

## 2018-10-01 ENCOUNTER — Encounter: Payer: Self-pay | Admitting: Obstetrics and Gynecology

## 2018-10-01 VITALS — BP 142/86 | HR 91 | Wt 179.9 lb

## 2018-10-01 DIAGNOSIS — O24410 Gestational diabetes mellitus in pregnancy, diet controlled: Secondary | ICD-10-CM | POA: Diagnosis not present

## 2018-10-01 DIAGNOSIS — Z3009 Encounter for other general counseling and advice on contraception: Secondary | ICD-10-CM

## 2018-10-01 DIAGNOSIS — O133 Gestational [pregnancy-induced] hypertension without significant proteinuria, third trimester: Secondary | ICD-10-CM | POA: Diagnosis not present

## 2018-10-01 DIAGNOSIS — O09213 Supervision of pregnancy with history of pre-term labor, third trimester: Secondary | ICD-10-CM

## 2018-10-01 DIAGNOSIS — Z87898 Personal history of other specified conditions: Secondary | ICD-10-CM

## 2018-10-01 DIAGNOSIS — O09212 Supervision of pregnancy with history of pre-term labor, second trimester: Secondary | ICD-10-CM

## 2018-10-01 DIAGNOSIS — O0990 Supervision of high risk pregnancy, unspecified, unspecified trimester: Secondary | ICD-10-CM

## 2018-10-01 DIAGNOSIS — O09892 Supervision of other high risk pregnancies, second trimester: Secondary | ICD-10-CM

## 2018-10-01 DIAGNOSIS — F1491 Cocaine use, unspecified, in remission: Secondary | ICD-10-CM

## 2018-10-01 DIAGNOSIS — O0993 Supervision of high risk pregnancy, unspecified, third trimester: Secondary | ICD-10-CM

## 2018-10-01 DIAGNOSIS — O09523 Supervision of elderly multigravida, third trimester: Secondary | ICD-10-CM

## 2018-10-01 DIAGNOSIS — O09293 Supervision of pregnancy with other poor reproductive or obstetric history, third trimester: Secondary | ICD-10-CM

## 2018-10-01 NOTE — Progress Notes (Signed)
   PRENATAL VISIT NOTE  Subjective:  [name] is a 37 y.o. [id number] at [redacted]w[redacted]d being seen today for ongoing prenatal care.  She is currently monitored for the following issues for this high-risk pregnancy and has History of preeclampsia, prior pregnancy, currently pregnant, third trimester; Right ovarian cyst; Anemia; Gestational diabetes mellitus, class A1; Cocaine use; TIA (transient ischemic attack); Acute left-sided weakness; Prediabetes; HLD (hyperlipidemia); Supervision of high risk pregnancy, antepartum; Advanced maternal age in multigravida; History of preterm delivery, currently pregnant in second trimester; Kidney stones; History of cocaine use; Unwanted fertility; Gestational hypertension, third trimester; and Preterm labor in third trimester without delivery on their problem list.  Patient reports contractions, cramping.  Contractions: Irregular. Vag. Bleeding: None.  Movement: Present. Denies leaking of fluid.   The following portions of the patient's history were reviewed and updated as appropriate: allergies, current medications, past family history, past medical history, past social history, past surgical history and problem list. Problem list updated.  Objective:   Vitals:   10/01/18 0906  BP: (!) 142/86  Pulse: 91  Weight: 179 lb 14.4 oz (81.6 kg)    Fetal Status: Fetal Heart Rate (bpm): NST   Movement: Present     General:  Alert, oriented and cooperative. Patient is in no acute distress.  Skin: Skin is warm and dry. No rash noted.   Cardiovascular: Normal heart rate noted  Respiratory: Normal respiratory effort, no problems with respiration noted  Abdomen: Soft, gravid, appropriate for gestational age.  Pain/Pressure: Present     Pelvic: Cervical exam deferred        Extremities: Normal range of motion.  Edema: None  Mental Status: Normal mood and affect. Normal behavior. Normal judgment and thought content.   Assessment and Plan:  Pregnancy: [id number] at  [redacted]w[redacted]d  1. Supervision of high risk pregnancy, antepartum   2. History of preeclampsia, prior pregnancy, currently pregnant, third trimester Cont baby ASA  3. Gestational diabetes mellitus, class A1 Has not been checking CBG, states she was not taught to check at home Has been doing no sugar, low carb diet NST today reactive AFI today 20 cm Cont weekly testing  4. Gestational hypertension, third trimester Meets criteria for IOL for 37 weeks Scheduled today  5. Multigravida of advanced maternal age in third trimester   6. History of preterm delivery, currently pregnant in second trimester   7. Preterm labor in third trimester without delivery Still feeling crampy, but no worse  8. Unwanted fertility Desires BTL  9. History of cocaine use Neg UDS in this pregnancy  Preterm labor symptoms and general obstetric precautions including but not limited to vaginal bleeding, contractions, leaking of fluid and fetal movement were reviewed in detail with the patient. Please refer to After Visit Summary for other counseling recommendations.  Return in about 1 week (around 10/08/2018) for OB visit (MD), AFI, NST.  Future Appointments  Date Time Provider Department Center  10/05/2018  9:45 AM [facility] NURSE [facility] None  10/08/2018  9:30 AM [facility] ULTRASOUND [facility] None  10/08/2018 10:15 AM Conan Bowens, MD [facility] None  10/12/2018  9:30 AM [facility] ULTRASOUND [facility] None  10/12/2018 10:30 AM [facility] NURSE [facility] None  10/15/2018  9:30 AM [facility] ULTRASOUND [facility] None  10/15/2018 10:30 AM Anyanwu, Jethro Bastos, MD [facility] None    Conan Bowens, MD

## 2018-10-01 NOTE — Telephone Encounter (Signed)
Preadmission screen  

## 2018-10-01 NOTE — Progress Notes (Signed)
Pt presents for ROB. Pt has no complaints. She denies having any headaches, visual changes, or swelling.

## 2018-10-03 ENCOUNTER — Encounter (HOSPITAL_COMMUNITY)
Admission: AD | Disposition: A | Discharge status: Home / Self Care | Payer: Self-pay | Source: Home / Self Care | Attending: Family Medicine

## 2018-10-03 ENCOUNTER — Inpatient Hospital Stay (HOSPITAL_COMMUNITY)
Admission: AD | Admit: 2018-10-03 | Discharge: 2018-10-05 | DRG: 807 | Disposition: A | Discharge status: Home / Self Care | Payer: Medicaid Other | Attending: Family Medicine | Admitting: Family Medicine

## 2018-10-03 ENCOUNTER — Encounter (HOSPITAL_COMMUNITY): Payer: Self-pay | Admitting: *Deleted

## 2018-10-03 ENCOUNTER — Inpatient Hospital Stay (HOSPITAL_COMMUNITY): Payer: Medicaid Other | Admitting: Anesthesiology

## 2018-10-03 ENCOUNTER — Other Ambulatory Visit: Payer: Self-pay

## 2018-10-03 DIAGNOSIS — O09529 Supervision of elderly multigravida, unspecified trimester: Secondary | ICD-10-CM

## 2018-10-03 DIAGNOSIS — O09892 Supervision of other high risk pregnancies, second trimester: Secondary | ICD-10-CM

## 2018-10-03 DIAGNOSIS — Z3A36 36 weeks gestation of pregnancy: Secondary | ICD-10-CM | POA: Diagnosis not present

## 2018-10-03 DIAGNOSIS — O24410 Gestational diabetes mellitus in pregnancy, diet controlled: Secondary | ICD-10-CM | POA: Diagnosis present

## 2018-10-03 DIAGNOSIS — Z8673 Personal history of transient ischemic attack (TIA), and cerebral infarction without residual deficits: Secondary | ICD-10-CM | POA: Diagnosis not present

## 2018-10-03 DIAGNOSIS — O163 Unspecified maternal hypertension, third trimester: Secondary | ICD-10-CM | POA: Diagnosis present

## 2018-10-03 DIAGNOSIS — Z87898 Personal history of other specified conditions: Secondary | ICD-10-CM

## 2018-10-03 DIAGNOSIS — O134 Gestational [pregnancy-induced] hypertension without significant proteinuria, complicating childbirth: Secondary | ICD-10-CM | POA: Diagnosis present

## 2018-10-03 DIAGNOSIS — O09293 Supervision of pregnancy with other poor reproductive or obstetric history, third trimester: Secondary | ICD-10-CM

## 2018-10-03 DIAGNOSIS — F1491 Cocaine use, unspecified, in remission: Secondary | ICD-10-CM

## 2018-10-03 DIAGNOSIS — O9962 Diseases of the digestive system complicating childbirth: Secondary | ICD-10-CM | POA: Diagnosis present

## 2018-10-03 DIAGNOSIS — O133 Gestational [pregnancy-induced] hypertension without significant proteinuria, third trimester: Secondary | ICD-10-CM | POA: Diagnosis present

## 2018-10-03 DIAGNOSIS — K219 Gastro-esophageal reflux disease without esophagitis: Secondary | ICD-10-CM | POA: Diagnosis present

## 2018-10-03 DIAGNOSIS — F1411 Cocaine abuse, in remission: Secondary | ICD-10-CM | POA: Diagnosis present

## 2018-10-03 DIAGNOSIS — O1414 Severe pre-eclampsia complicating childbirth: Secondary | ICD-10-CM | POA: Diagnosis present

## 2018-10-03 DIAGNOSIS — O24420 Gestational diabetes mellitus in childbirth, diet controlled: Secondary | ICD-10-CM | POA: Diagnosis present

## 2018-10-03 DIAGNOSIS — F1490 Cocaine use, unspecified, uncomplicated: Secondary | ICD-10-CM | POA: Diagnosis present

## 2018-10-03 DIAGNOSIS — O09212 Supervision of pregnancy with history of pre-term labor, second trimester: Secondary | ICD-10-CM

## 2018-10-03 HISTORY — DX: Unspecified maternal hypertension, third trimester: O16.3

## 2018-10-03 LAB — TYPE AND SCREEN
ABO/RH(D): O POS
ANTIBODY SCREEN: NEGATIVE

## 2018-10-03 LAB — COMPREHENSIVE METABOLIC PANEL
ALT: 29 U/L (ref 0–44)
AST: 37 U/L (ref 15–41)
Albumin: 2.9 g/dL — ABNORMAL LOW (ref 3.5–5.0)
Alkaline Phosphatase: 98 U/L (ref 38–126)
Anion gap: 12 (ref 5–15)
BILIRUBIN TOTAL: 0.3 mg/dL (ref 0.3–1.2)
BUN: 8 mg/dL (ref 6–20)
CHLORIDE: 105 mmol/L (ref 98–111)
CO2: 19 mmol/L — ABNORMAL LOW (ref 22–32)
CREATININE: 0.71 mg/dL (ref 0.44–1.00)
Calcium: 10.8 mg/dL — ABNORMAL HIGH (ref 8.9–10.3)
GFR calc Af Amer: >60 mL/min (ref >60)
Glucose, Bld: 97 mg/dL (ref 70–99)
POTASSIUM: 3.6 mmol/L (ref 3.5–5.1)
Sodium: 136 mmol/L (ref 135–145)
TOTAL PROTEIN: 6.4 g/dL — AB (ref 6.5–8.1)

## 2018-10-03 LAB — URINALYSIS, ROUTINE W REFLEX MICROSCOPIC
BILIRUBIN URINE: NEGATIVE
Glucose, UA: NEGATIVE mg/dL
Hgb urine dipstick: NEGATIVE
KETONES UR: NEGATIVE mg/dL
Leukocytes, UA: NEGATIVE
NITRITE: NEGATIVE
PROTEIN: NEGATIVE mg/dL
Specific Gravity, Urine: 1.002 — ABNORMAL LOW (ref 1.005–1.030)
pH: 8 (ref 5.0–8.0)

## 2018-10-03 LAB — CBC
HCT: 31.2 % — ABNORMAL LOW (ref 36.0–46.0)
HEMATOCRIT: 34.3 % — AB (ref 36.0–46.0)
Hemoglobin: 11.5 g/dL — ABNORMAL LOW (ref 12.0–15.0)
Hemoglobin: 10.3 g/dL — ABNORMAL LOW (ref 12.0–15.0)
MCH: 29.1 pg (ref 26.0–34.0)
MCH: 29 pg (ref 26.0–34.0)
MCHC: 33.5 g/dL (ref 30.0–36.0)
MCHC: 33 g/dL (ref 30.0–36.0)
MCV: 86.8 fL (ref 80.0–100.0)
MCV: 87.9 fL (ref 80.0–100.0)
NRBC: 0 % (ref 0.0–0.2)
Platelets: 154 10*3/uL (ref 150–400)
Platelets: 239 10*3/uL (ref 150–400)
RBC: 3.95 MIL/uL (ref 3.87–5.11)
RBC: 3.55 MIL/uL — AB (ref 3.87–5.11)
RDW: 14.7 % (ref 11.5–15.5)
RDW: 14.5 % (ref 11.5–15.5)
WBC: 11 10*3/uL — AB (ref 4.0–10.5)
WBC: 14.7 10*3/uL — ABNORMAL HIGH (ref 4.0–10.5)
nRBC: 0 % (ref 0.0–0.2)

## 2018-10-03 LAB — PROTEIN / CREATININE RATIO, URINE: Creatinine, Urine: 19 mg/dL

## 2018-10-03 LAB — RAPID URINE DRUG SCREEN, HOSP PERFORMED
AMPHETAMINES: NOT DETECTED
Barbiturates: NOT DETECTED
Benzodiazepines: NOT DETECTED
Cocaine: NOT DETECTED
Opiates: NOT DETECTED
TETRAHYDROCANNABINOL: NOT DETECTED

## 2018-10-03 LAB — GLUCOSE, CAPILLARY
Glucose-Capillary: 117 mg/dL — ABNORMAL HIGH (ref 70–99)
Glucose-Capillary: 111 mg/dL — ABNORMAL HIGH (ref 70–99)
Glucose-Capillary: 93 mg/dL (ref 70–99)

## 2018-10-03 SURGERY — LIGATION, FALLOPIAN TUBE, POSTPARTUM
Anesthesia: Epidural | Laterality: Bilateral

## 2018-10-03 MED ORDER — LACTATED RINGERS IV SOLN
500.0000 mL | Freq: Once | INTRAVENOUS | Status: AC
  Administered 2018-10-03: 500 mL via INTRAVENOUS

## 2018-10-03 MED ORDER — DIPHENHYDRAMINE HCL 50 MG/ML IJ SOLN: 12.5000 mg | INTRAMUSCULAR | Status: DC | PRN

## 2018-10-03 MED ORDER — LACTATED RINGERS IV BOLUS
1000.0000 mL | Freq: Once | INTRAVENOUS | Status: AC
  Administered 2018-10-03: 1000 mL via INTRAVENOUS

## 2018-10-03 MED ORDER — MAGNESIUM SULFATE BOLUS VIA INFUSION
4.0000 g | Freq: Once | INTRAVENOUS | Status: AC
  Administered 2018-10-03: 4 g via INTRAVENOUS
  Filled 2018-10-03: qty 500

## 2018-10-03 MED ORDER — ACETAMINOPHEN 325 MG PO TABS
650.0000 mg | ORAL_TABLET | ORAL | Status: DC | PRN
  Administered 2018-10-03: 650 mg via ORAL
  Filled 2018-10-03: qty 2

## 2018-10-03 MED ORDER — FENTANYL 2.5 MCG/ML BUPIVACAINE 1/10 % EPIDURAL INFUSION (WH - ANES)
14.0000 mL/h | INTRAMUSCULAR | Status: DC | PRN
  Administered 2018-10-03 (×2): 14 mL/h via EPIDURAL
  Filled 2018-10-03 (×2): qty 100

## 2018-10-03 MED ORDER — MEASLES, MUMPS & RUBELLA VAC ~~LOC~~ INJ: 0.5000 mL | INJECTION | Freq: Once | SUBCUTANEOUS | Status: DC

## 2018-10-03 MED ORDER — LIDOCAINE HCL (PF) 1 % IJ SOLN
INTRAMUSCULAR | Status: DC | PRN
  Administered 2018-10-03: 5 mL via EPIDURAL

## 2018-10-03 MED ORDER — LACTATED RINGERS IV SOLN
INTRAVENOUS | Status: DC
  Administered 2018-10-03 (×2): via INTRAVENOUS

## 2018-10-03 MED ORDER — SIMETHICONE 80 MG PO CHEW: 80.0000 mg | CHEWABLE_TABLET | ORAL | Status: DC | PRN

## 2018-10-03 MED ORDER — PRENATAL MULTIVITAMIN CH
1.0000 | ORAL_TABLET | Freq: Every day | ORAL | Status: DC
  Administered 2018-10-04 – 2018-10-05 (×2): 1 via ORAL
  Filled 2018-10-03 (×2): qty 1

## 2018-10-03 MED ORDER — DIBUCAINE 1 % RE OINT: 1.0000 "application " | TOPICAL_OINTMENT | RECTAL | Status: DC | PRN

## 2018-10-03 MED ORDER — SODIUM CHLORIDE 0.9% FLUSH: 3.0000 mL | INTRAVENOUS | Status: DC | PRN

## 2018-10-03 MED ORDER — BENZOCAINE-MENTHOL 20-0.5 % EX AERO: 1.0000 "application " | INHALATION_SPRAY | CUTANEOUS | Status: DC | PRN

## 2018-10-03 MED ORDER — OXYTOCIN BOLUS FROM INFUSION
500.0000 mL | Freq: Once | INTRAVENOUS | Status: AC
  Administered 2018-10-03: 500 mL via INTRAVENOUS

## 2018-10-03 MED ORDER — SOD CITRATE-CITRIC ACID 500-334 MG/5ML PO SOLN
30.0000 mL | ORAL | Status: DC | PRN
  Administered 2018-10-03: 30 mL via ORAL
  Filled 2018-10-03: qty 15

## 2018-10-03 MED ORDER — LABETALOL HCL 5 MG/ML IV SOLN: 80.0000 mg | INTRAVENOUS | Status: DC | PRN

## 2018-10-03 MED ORDER — EPHEDRINE 5 MG/ML INJ
10.0000 mg | INTRAVENOUS | Status: DC | PRN
  Filled 2018-10-03: qty 2

## 2018-10-03 MED ORDER — OXYTOCIN 40 UNITS IN LACTATED RINGERS INFUSION - SIMPLE MED
1.0000 m[IU]/min | INTRAVENOUS | Status: DC
  Administered 2018-10-03: 2 m[IU]/min via INTRAVENOUS
  Filled 2018-10-03: qty 1000

## 2018-10-03 MED ORDER — LABETALOL HCL 5 MG/ML IV SOLN: 20.0000 mg | INTRAVENOUS | Status: DC | PRN

## 2018-10-03 MED ORDER — SENNOSIDES-DOCUSATE SODIUM 8.6-50 MG PO TABS
2.0000 | ORAL_TABLET | ORAL | Status: DC
  Administered 2018-10-03 – 2018-10-04 (×2): 2 via ORAL
  Filled 2018-10-03 (×2): qty 2

## 2018-10-03 MED ORDER — COCONUT OIL OIL: 1.0000 "application " | TOPICAL_OIL | Status: DC | PRN

## 2018-10-03 MED ORDER — SODIUM CHLORIDE 0.9% FLUSH: 3.0000 mL | Freq: Two times a day (BID) | INTRAVENOUS | Status: DC

## 2018-10-03 MED ORDER — MAGNESIUM SULFATE 40 G IN LACTATED RINGERS - SIMPLE
2.0000 g/h | INTRAVENOUS | Status: DC
  Administered 2018-10-03: 2 g/h via INTRAVENOUS
  Filled 2018-10-03: qty 500

## 2018-10-03 MED ORDER — DEXAMETHASONE SODIUM PHOSPHATE 10 MG/ML IJ SOLN
10.0000 mg | Freq: Once | INTRAMUSCULAR | Status: AC
  Administered 2018-10-03: 10 mg via INTRAVENOUS
  Filled 2018-10-03: qty 1

## 2018-10-03 MED ORDER — DIPHENHYDRAMINE HCL 25 MG PO CAPS: 25.0000 mg | ORAL_CAPSULE | Freq: Four times a day (QID) | ORAL | Status: DC | PRN

## 2018-10-03 MED ORDER — PHENYLEPHRINE 40 MCG/ML (10ML) SYRINGE FOR IV PUSH (FOR BLOOD PRESSURE SUPPORT)
80.0000 ug | PREFILLED_SYRINGE | INTRAVENOUS | Status: DC | PRN
  Filled 2018-10-03: qty 5
  Filled 2018-10-03: qty 10

## 2018-10-03 MED ORDER — ONDANSETRON HCL 4 MG PO TABS: 4.0000 mg | ORAL_TABLET | ORAL | Status: DC | PRN

## 2018-10-03 MED ORDER — PROMETHAZINE HCL 25 MG PO TABS
25.0000 mg | ORAL_TABLET | Freq: Once | ORAL | Status: AC
  Administered 2018-10-03: 25 mg via ORAL
  Filled 2018-10-03: qty 1

## 2018-10-03 MED ORDER — PHENYLEPHRINE 40 MCG/ML (10ML) SYRINGE FOR IV PUSH (FOR BLOOD PRESSURE SUPPORT)
80.0000 ug | PREFILLED_SYRINGE | INTRAVENOUS | Status: DC | PRN
  Filled 2018-10-03: qty 5

## 2018-10-03 MED ORDER — LABETALOL HCL 5 MG/ML IV SOLN: 40.0000 mg | INTRAVENOUS | Status: DC | PRN

## 2018-10-03 MED ORDER — ZOLPIDEM TARTRATE 5 MG PO TABS: 5.0000 mg | ORAL_TABLET | Freq: Every evening | ORAL | Status: DC | PRN

## 2018-10-03 MED ORDER — SODIUM CHLORIDE 0.9 % IV SOLN: 250.0000 mL | INTRAVENOUS | Status: DC | PRN

## 2018-10-03 MED ORDER — ACETAMINOPHEN 325 MG PO TABS
650.0000 mg | ORAL_TABLET | ORAL | Status: DC | PRN
  Filled 2018-10-03: qty 2

## 2018-10-03 MED ORDER — ONDANSETRON HCL 4 MG/2ML IJ SOLN: 4.0000 mg | INTRAMUSCULAR | Status: DC | PRN

## 2018-10-03 MED ORDER — OXYCODONE-ACETAMINOPHEN 5-325 MG PO TABS: 1.0000 | ORAL_TABLET | ORAL | Status: DC | PRN

## 2018-10-03 MED ORDER — OXYTOCIN 40 UNITS IN LACTATED RINGERS INFUSION - SIMPLE MED: 2.5000 [IU]/h | INTRAVENOUS | Status: DC

## 2018-10-03 MED ORDER — TERBUTALINE SULFATE 1 MG/ML IJ SOLN: 0.2500 mg | Freq: Once | INTRAMUSCULAR | Status: DC | PRN

## 2018-10-03 MED ORDER — IBUPROFEN 600 MG PO TABS
600.0000 mg | ORAL_TABLET | Freq: Four times a day (QID) | ORAL | Status: DC
  Administered 2018-10-03 – 2018-10-05 (×8): 600 mg via ORAL
  Filled 2018-10-03 (×8): qty 1

## 2018-10-03 MED ORDER — ONDANSETRON HCL 4 MG/2ML IJ SOLN: 4.0000 mg | Freq: Once | INTRAMUSCULAR | Status: DC

## 2018-10-03 MED ORDER — LIDOCAINE HCL (PF) 1 % IJ SOLN
30.0000 mL | INTRAMUSCULAR | Status: DC | PRN
  Filled 2018-10-03: qty 30

## 2018-10-03 MED ORDER — TETANUS-DIPHTH-ACELL PERTUSSIS 5-2.5-18.5 LF-MCG/0.5 IM SUSP: 0.5000 mL | Freq: Once | INTRAMUSCULAR | Status: DC

## 2018-10-03 MED ORDER — WITCH HAZEL-GLYCERIN EX PADS: 1.0000 "application " | MEDICATED_PAD | CUTANEOUS | Status: DC | PRN

## 2018-10-03 MED ORDER — LACTATED RINGERS IV SOLN
INTRAVENOUS | Status: DC
  Administered 2018-10-03: 15:00:00 via INTRAVENOUS

## 2018-10-03 MED ORDER — LACTATED RINGERS IV SOLN
500.0000 mL | INTRAVENOUS | Status: DC | PRN
  Administered 2018-10-03: 1000 mL via INTRAVENOUS

## 2018-10-03 MED ORDER — BUTALBITAL-APAP-CAFFEINE 50-325-40 MG PO TABS
2.0000 | ORAL_TABLET | Freq: Once | ORAL | Status: AC
  Administered 2018-10-03: 2 via ORAL
  Filled 2018-10-03: qty 2

## 2018-10-03 MED ORDER — OXYCODONE-ACETAMINOPHEN 5-325 MG PO TABS: 2.0000 | ORAL_TABLET | ORAL | Status: DC | PRN

## 2018-10-03 MED ORDER — HYDRALAZINE HCL 20 MG/ML IJ SOLN: 10.0000 mg | INTRAMUSCULAR | Status: DC | PRN

## 2018-10-03 MED ORDER — MAGNESIUM SULFATE 40 G IN LACTATED RINGERS - SIMPLE
2.0000 g/h | INTRAVENOUS | Status: AC
  Administered 2018-10-04: 2 g/h via INTRAVENOUS
  Filled 2018-10-03 (×2): qty 500

## 2018-10-03 MED ORDER — ONDANSETRON HCL 4 MG/2ML IJ SOLN
4.0000 mg | Freq: Four times a day (QID) | INTRAMUSCULAR | Status: DC | PRN
  Administered 2018-10-03: 4 mg via INTRAVENOUS
  Filled 2018-10-03: qty 2

## 2018-10-03 MED ORDER — PROMETHAZINE HCL 25 MG/ML IJ SOLN
25.0000 mg | Freq: Four times a day (QID) | INTRAMUSCULAR | Status: DC | PRN
  Administered 2018-10-03: 25 mg via INTRAVENOUS
  Filled 2018-10-03: qty 1

## 2018-10-03 NOTE — MAU Note (Signed)
Pt started with a bad headache at 1900 associated with dizziness and floaters. She took 1gm of tylenol @ 2100 with no relief. Contractions and vag cramps started @ 2100. No c/o vag bleeding or leaking. Reports decreased fetal movement over the past hour.

## 2018-10-03 NOTE — Anesthesia Procedure Notes (Signed)
Epidural Patient location during procedure: OB Start time: 10/03/2018 8:32 AM End time: 10/03/2018 8:38 AM  Staffing Anesthesiologist: Shelton Silvas, MD Performed: anesthesiologist   Preanesthetic Checklist Completed: patient identified, site marked, surgical consent, pre-op evaluation, timeout performed, IV checked, risks and benefits discussed and monitors and equipment checked  Epidural Patient position: sitting Prep: ChloraPrep Patient monitoring: heart rate, continuous pulse ox and blood pressure Approach: midline Location: L3-L4 Injection technique: LOR saline  Needle:  Needle type: Tuohy  Needle gauge: 17 G Needle length: 9 cm Catheter type: closed end flexible Catheter size: 20 Guage Test dose: negative and 1.5% lidocaine  Assessment Events: blood not aspirated, injection not painful, no injection resistance and no paresthesia  Additional Notes LOR @ 5.5  Patient identified. Risks/Benefits/Options discussed with patient including but not limited to bleeding, infection, nerve damage, paralysis, failed block, incomplete pain control, headache, blood pressure changes, nausea, vomiting, reactions to medications, itching and postpartum back pain. Confirmed with bedside nurse the patient's most recent platelet count. Confirmed with patient that they are not currently taking any anticoagulation, have any bleeding history or any family history of bleeding disorders. Patient expressed understanding and wished to proceed. All questions were answered. Sterile technique was used throughout the entire procedure. Please see nursing notes for vital signs. Test dose was given through epidural catheter and negative prior to continuing to dose epidural or start infusion. Warning signs of high block given to the patient including shortness of breath, tingling/numbness in hands, complete motor block, or any concerning symptoms with instructions to call for help. Patient was given instructions  on fall risk and not to get out of bed. All questions and concerns addressed with instructions to call with any issues or inadequate analgesia.    Reason for block:procedure for pain

## 2018-10-03 NOTE — Anesthesia Pain Management Evaluation Note (Signed)
  CRNA Pain Management Visit Note  Patient: Rogina L Torres-Tejeda, 37 y.o., female  "Hello I am a member of the anesthesia team at [facility]. We have an anesthesia team available at all times to provide care throughout the hospital, including epidural management and anesthesia for C-section. I don't know your plan for the delivery whether it a natural birth, water birth, IV sedation, nitrous supplementation, doula or epidural, but we want to meet your pain goals."   1.Was your pain managed to your expectations on prior hospitalizations?   Yes   2.What is your expectation for pain management during this hospitalization?     Epidural  3.How can we help you reach that goal? Epidural being placed at time of visit  Record the patient's initial score and the patient's pain goal.   Pain: 9  Pain Goal: 7 [facility] wants you to be able to say your pain was always managed very well.  Rica Records 10/03/2018

## 2018-10-03 NOTE — MAU Provider Note (Signed)
Chief Complaint:  No chief complaint on file.   First Provider Initiated Contact with Patient 10/03/18 0208      HPI: [name] is a 37 y.o. [id number] at [redacted]w[redacted]d with hx PTD and preeclampsia with current A1DM who presents to maternity admissions reporting onset n/v today followed by severe h/a.  Her headache is right sided, behind he right eye, pulsing pain with photophobia.  She took Tylenol 1000 mg but it did not help. She reports vomiting 7-8 times since onset in the morning.  She also reports abdominal cramping and sharp shooting vaginal pain today.  This is different and more painful than the pain she has been having. Of note, she has had 9 MAU visits since 08/23/18 and 2 admissions, both with positive amnisure but negative additional testing.  She has remained advanced dilation at 5-6 cm.  She was last admitted on 09/26/18 with headache and elevated BP as well as contractions and concerns for preterm labor.  Symptoms resolved and BP remained stable with normal labs so pt discharged with plan to deliver at 37 weeks.  She reports good fetal movement, denies LOF, vaginal bleeding, vaginal itching/burning, urinary symptoms, or fever/chills.    HPI  Past Medical History: Past Medical History:  Diagnosis Date  . Diabetes mellitus without complication (HCC)   . GERD (gastroesophageal reflux disease)   . Gestational hypertension   . History of cocaine use   . Kidney stones   . Ovarian cyst     Past obstetric history: OB History  Gravida Para Term Preterm AB Living  6 5 4 1   5   SAB TAB Ectopic Multiple Live Births        0 5    # Outcome Date GA Lbr Len/2nd Weight Sex Delivery Anes PTL Lv  6 Current           5 Preterm 01/13/16 [redacted]w[redacted]d 05:59 / 00:04 2310 g M Vag-Spont None  LIV  4 Term 01/10/14 [redacted]w[redacted]d 09:00 / 04:13 3496 g M Vag-Spont EPI  LIV     Birth Comments: Mom 70 y/o G4P4. Labs neg. Gestational HTN on [place]. Baby 37 w, NSVD, L hydronephrosis which was normal at 36 w U/S.  Had phototherapy due to 10 bili at 24 hrs. Remained at 10. Requested repeat today.  Mom O+/ baby O+.   3 Term 01/10/06 [redacted]w[redacted]d  3402 g F Vag-Spont EPI  LIV  2 Term 09/02/01 [redacted]w[redacted]d  4139 g M Vag-Spont EPI  LIV  1 Term 01/24/97 [redacted]w[redacted]d  2835 g F Vag-Spont EPI  LIV    Past Surgical History: Past Surgical History:  Procedure Laterality Date  . APPENDECTOMY    . HEMORRHOID SURGERY N/A 01/19/2016   Procedure: EXTENSIVE HEMORRHOIDECTOMY;  Surgeon: Franky Macho, MD;  Location: [facility];  Service: General;  Laterality: N/A;  . kidney stents    . LITHOTRIPSY      Family History: Family History  Problem Relation Age of Onset  . Cancer Mother        breast  . Diabetes Maternal Grandmother     Social History: Social History   Tobacco Use  . Smoking status: Never Smoker  . Smokeless tobacco: Never Used  Substance Use Topics  . Alcohol use: No  . Drug use: No    Comment: Hx Cocaine use    Allergies: No Known Allergies  Meds:  Facility-Administered Medications Prior to Admission  Medication Dose Route Frequency Provider Last Rate Last Dose  . HYDROXYprogesterone Caproate SOAJ  275 mg  275 mg Subcutaneous Weekly Denney, Rachelle A, CNM   275 mg at 09/21/18 1610   Medications Prior to Admission  Medication Sig Dispense Refill Last Dose  . acetaminophen (TYLENOL) 325 MG tablet Take 650 mg by mouth every 6 (six) hours as needed for mild pain or headache.    Taking  . calcium carbonate (TUMS - DOSED IN MG ELEMENTAL CALCIUM) 500 MG chewable tablet Chew 2 tablets by mouth daily as needed for indigestion or heartburn.   Taking  . Doxylamine-Pyridoxine (DICLEGIS) 10-10 MG TBEC Take 1 tablet by mouth 2 (two) times daily as needed (nausea/vomiting).   Taking  . Elastic Bandages & Supports (COMFORT FIT MATERNITY SUPP LG) MISC 1 Units by Does not apply route daily. 1 each 0 Taking  . NIFEdipine (PROCARDIA-XL/ADALAT CC) 30 MG 24 hr tablet Take 1 tablet (30 mg total) by mouth 2 (two) times daily. 60 tablet  1 Taking  . Prenatal Vit-Fe Fumarate-FA (PRENATAL MULTIVITAMIN) TABS tablet Take 1 tablet by mouth daily at 12 noon.   Taking    ROS:  Review of Systems  Constitutional: Negative for chills, fatigue and fever.  Eyes: Negative for visual disturbance.  Respiratory: Negative for shortness of breath.   Cardiovascular: Negative for chest pain.  Gastrointestinal: Positive for abdominal pain, nausea and vomiting.  Genitourinary: Positive for pelvic pain and vaginal pain. Negative for difficulty urinating, dysuria, flank pain, vaginal bleeding and vaginal discharge.  Musculoskeletal: Positive for back pain.  Neurological: Positive for headaches. Negative for dizziness.  Psychiatric/Behavioral: Negative.      I have reviewed patient's Past Medical Hx, Surgical Hx, Family Hx, Social Hx, medications and allergies.   Physical Exam   Patient Vitals for the past 24 hrs:  BP Temp Temp src Pulse Resp  10/03/18 0323 (!) 141/78 - - (!) 105 -  10/03/18 0300 132/72 - - (!) 105 -  10/03/18 0236 129/77 - - (!) 140 -  10/03/18 0224 (!) 149/91 - - (!) 125 -  10/03/18 0201 (!) 174/91 - - - -  10/03/18 0157 (!) 159/85 98.7 F (37.1 C) Oral (!) 115 16   Constitutional: Well-developed, well-nourished female in nmoderate distress.  Cardiovascular: normal rate Respiratory: normal effort GI: Abd soft, non-tender, gravid appropriate for gestational age.  MS: Extremities nontender, no edema, normal ROM Neurologic: Alert and oriented x 4.  GU: Neg CVAT.  Dilation: 5.5 Effacement (%): 60 Cervical Position: Middle Station: -2 Presentation: Vertex Exam by:: K. WeissRN  FHT:  Baseline 135 , moderate variability, accelerations present, no decelerations Contractions: q 3-10 mins   Labs: Results for orders placed or performed during the hospital encounter of 10/03/18 (from the past 24 hour(s))  CBC     Status: Abnormal   Collection Time: 10/03/18  2:14 AM  Result Value Ref Range   WBC 11.0 (H) 4.0 -  10.5 K/uL   RBC 3.95 3.87 - 5.11 MIL/uL   Hemoglobin 11.5 (L) 12.0 - 15.0 g/dL   HCT 96.0 (L) 45.4 - 09.8 %   MCV 86.8 80.0 - 100.0 fL   MCH 29.1 26.0 - 34.0 pg   MCHC 33.5 30.0 - 36.0 g/dL   RDW 11.9 14.7 - 82.9 %   Platelets 154 150 - 400 K/uL   nRBC 0.0 0.0 - 0.2 %  Comprehensive metabolic panel     Status: Abnormal   Collection Time: 10/03/18  2:20 AM  Result Value Ref Range   Sodium 136 135 - 145 mmol/L  Potassium 3.6 3.5 - 5.1 mmol/L   Chloride 105 98 - 111 mmol/L   CO2 19 (L) 22 - 32 mmol/L   Glucose, Bld 97 70 - 99 mg/dL   BUN 8 6 - 20 mg/dL   Creatinine, Ser 1.61 0.44 - 1.00 mg/dL   Calcium 09.6 (H) 8.9 - 10.3 mg/dL   Total Protein 6.4 (L) 6.5 - 8.1 g/dL   Albumin 2.9 (L) 3.5 - 5.0 g/dL   AST 37 15 - 41 U/L   ALT 29 0 - 44 U/L   Alkaline Phosphatase 98 38 - 126 U/L   Total Bilirubin 0.3 0.3 - 1.2 mg/dL   GFR calc non Af Amer >60 >60 mL/min   GFR calc Af Amer >60 >60 mL/min   Anion gap 12 5 - 15  Protein / creatinine ratio, urine     Status: None   Collection Time: 10/03/18  3:27 AM  Result Value Ref Range   Creatinine, Urine 19.00 mg/dL   Total Protein, Urine <6 mg/dL   Protein Creatinine Ratio        0.00 - 0.15 mg/mg[Cre]  Rapid urine drug screen (hospital performed)     Status: None   Collection Time: 10/03/18  3:27 AM  Result Value Ref Range   Opiates NONE DETECTED NONE DETECTED   Cocaine NONE DETECTED NONE DETECTED   Benzodiazepines NONE DETECTED NONE DETECTED   Amphetamines NONE DETECTED NONE DETECTED   Tetrahydrocannabinol NONE DETECTED NONE DETECTED   Barbiturates NONE DETECTED NONE DETECTED  Urinalysis, Routine w reflex microscopic     Status: Abnormal   Collection Time: 10/03/18  3:27 AM  Result Value Ref Range   Color, Urine STRAW (A) YELLOW   APPearance CLEAR CLEAR   Specific Gravity, Urine 1.002 (L) 1.005 - 1.030   pH 8.0 5.0 - 8.0   Glucose, UA NEGATIVE NEGATIVE mg/dL   Hgb urine dipstick NEGATIVE NEGATIVE   Bilirubin Urine NEGATIVE  NEGATIVE   Ketones, ur NEGATIVE NEGATIVE mg/dL   Protein, ur NEGATIVE NEGATIVE mg/dL   Nitrite NEGATIVE NEGATIVE   Leukocytes, UA NEGATIVE NEGATIVE   --/--/O POS (10/05 0454)  Imaging:    MAU Course/MDM: I have ordered labs and reviewed results.  NST reviewed and reactive IV fluids, Phenergan 25 mg IV, Zofran 4 mg IV, and Decadron 10 mg IV given with little reduction in h/a but resolution of nausea.  Fioricet x 2 tabs given with no reduction in h/a. Preeclampsia labs all wnl with P/C ratio below reportable range Intractable h/a is evidence of worsening status, severe feature that requires induction Consult Dr Erin Fulling with presentation, exam findings and test results.  Admit to BS  Assessment: 1. Severe hypertension complicating pregnancy, third trimester   2. Preterm labor in third trimester without delivery   3. Gestational hypertension, third trimester   4. History of cocaine use   5.  GBS negative  Plan: Admit to BS Anticipate NSVDE   Sharen Counter Certified Nurse-Midwife 10/03/2018 6:24 AM

## 2018-10-03 NOTE — H&P (Signed)
[name] is a 37 y.o. female [phone] @[redacted]w[redacted]d  with hx preeclampsia in previous pregnancy, hx cocaine abuse in 2017, all negative UDS this pregnancy, and A1GDM and GHTN on ASA this pregnancy presenting with severe headache.  She is admitted for gestational hypertension with severe features.  She presented to [facility]U with h/a, nausea/vomiting, and hypertension with one severe range BP, others 140s/90s.  Preeclampsia labs wnl with P/C ratio below reportable range.  Pt h/a and n/v were treated with IV fluids, Phenergan 25 mg IV, Decadron 10 mg IV, Zofran 4 mg IV, and Fioricet x 2 tablets. Her nausea resolved but headache remained with 7/10 pain.  She has been monitored for advanced dilation x 1 month at 5.5 cm.  She has had multiple MAU visits and admissions in the third trimester for preterm labor but was discharged each time without cervical change.  She is feeling less fetal movement this week than earlier in the pregnancy.      Nursing Staff Provider  Office Location  [facility] Dating  ultrasound  Language  English Anatomy US  Limited has follow up in July  Flu Vaccine  08-11-18 Genetic Screen  NIPS:low risks   AFP: negative    TDaP vaccine   info given 08-11-18 Hgb A1C or  GTT Early  Third trimester   Rhogam     LAB RESULTS   Feeding Plan Breast/Bottle Blood Type --/--/O POS (09/27 0124) O pos  Contraception Tubal ligation Antibody NEG (09/27 0124)Neg  Circumcision Yes Rubella 1.66 (04/16 1115)Immune  Pediatrician  [facility] RPR Non Reactive (09/21 2240) NR  Support Person FOB HBsAg Negative (04/16 1115) Neg  Prenatal Classes No HIV Non Reactive (08/20 1122)NR  BTL Consent Signed 8/20 GBS  (For PCN allergy, check sensitivities) NEG  VBAC Consent  Pap  Negative4/16/19    Hgb Electro   AA    CF Neg         Waterbirth  [ ]  Class [ ]  Consent [ ]  CNM visit     OB History    Gravida  6   Para  5   Term  4   Preterm  1   AB      Living  5     SAB      TAB       Ectopic      Multiple  0   Live Births  5          Past Medical History:  Diagnosis Date  . Diabetes mellitus without complication (HCC)   . GERD (gastroesophageal reflux disease)   . Gestational hypertension   . History of cocaine use   . Kidney stones   . Ovarian cyst    Past Surgical History:  Procedure Laterality Date  . APPENDECTOMY    . HEMORRHOID SURGERY N/A 01/19/2016   Procedure: EXTENSIVE HEMORRHOIDECTOMY;  Surgeon: Franky Macho, MD;  Location: [facility];  Service: General;  Laterality: N/A;  . kidney stents    . LITHOTRIPSY     Family History: family history includes Cancer in her mother; Diabetes in her maternal grandmother. Social History:  reports that she has never smoked. She has never used smokeless tobacco. She reports that she does not drink alcohol or use drugs.     Maternal Diabetes: Yes:  Diabetes Type:  Diet controlled Genetic Screening: Normal Maternal Ultrasounds/Referrals: Abnormal:  Findings:   Other: Fetal Ultrasounds or other Referrals:  Fetal echo Maternal Substance Abuse:  No Significant Maternal Medications:  None Significant Maternal Lab Results:  Lab values include: Group B Strep negative Other Comments:  Possibel VSD on anatomy US, normal fetal echo, hx cocaine 2017 but neg UDS this pregnancy  Review of Systems  Constitutional: Negative for chills and fever.  Respiratory: Negative for shortness of breath.   Cardiovascular: Negative for chest pain.  Gastrointestinal: Positive for abdominal pain. Negative for constipation, diarrhea and vomiting.  Neurological: Positive for headaches. Negative for dizziness.  All other systems reviewed and are negative.  Maternal Medical History:  Reason for admission: GHTN with severe features   Contractions: Onset was less than 1 hour ago.   Frequency: irregular.   Perceived severity is moderate.    Fetal activity: Perceived fetal activity is decreased.   Last perceived fetal movement was within  the past hour.    Prenatal complications: no prenatal complications Prenatal Complications - Diabetes: gestational. Diabetes is managed by diet.      Dilation: 5.5 Effacement (%): 60 Station: -2 Exam by:: K. WeissRN Blood pressure (!) 141/78, pulse (!) 105, temperature 98.7 F (37.1 C), temperature source Oral, resp. rate 16, last menstrual period 01/12/2018. Maternal Exam:  Uterine Assessment: Contraction strength is mild.  Contraction frequency is irregular.   Abdomen: Fetal presentation: vertex  Pelvis: adequate for delivery.      Fetal Exam Fetal Monitor Review: Mode: ultrasound.   Baseline rate: 135.  Variability: moderate (6-25 bpm).   Pattern: accelerations present and no decelerations.    Fetal State Assessment: Category I - tracings are normal.     Physical Exam  Nursing note and vitals reviewed. Constitutional: She is oriented to person, place, and time. She appears well-developed and well-nourished.  Neck: Normal range of motion.  Cardiovascular: Normal rate, regular rhythm and normal heart sounds.  Respiratory: Effort normal and breath sounds normal.  GI: Soft.  Musculoskeletal: Normal range of motion.  Neurological: She is alert and oriented to person, place, and time. She has normal reflexes.  Skin: Skin is warm and dry.  Psychiatric: She has a normal mood and affect. Her behavior is normal. Judgment and thought content normal.    Prenatal labs: ABO, Rh: --/--/O POS (10/05 1610) Antibody: NEG (10/05 0955) Rubella: 1.66 (04/16 1115) RPR: Non Reactive (10/05 0955)  HBsAg: Negative (04/16 1115)  HIV: Non Reactive (08/20 1122)  GBS:   negative on 09/10/18  Assessment/Plan: 37 y.o. [name] @[redacted]w[redacted]d  GHTN with severe features GBS negative  Admit to [facility] per Dr Erin Fulling IOL for severe features Mag sulfate 4 g bolus, 2 g per hour Pitocin for induction with favorable cervix CBG Q 4 hours with diet controlled GDM Anticipate  NSVD  Sharen Counter 10/03/2018, 6:05 AM

## 2018-10-03 NOTE — Progress Notes (Signed)
LABOR PROGRESS NOTE  [name] is a 37 y.o. [id number] at [redacted]w[redacted]d  admitted for IOL for pre-e with severe features.   Subjective: Doing well. Feeling contractions more frequently and intense.   Objective: BP 121/65   Pulse 95   Temp 98.1 F (36.7 C)   Resp 18   Ht 5\' 3"  (1.6 m)   Wt 82.1 kg   LMP 01/12/2018 (Exact Date)   SpO2 97%   BMI 32.06 kg/m  or  Vitals:   10/03/18 1230 10/03/18 1300 10/03/18 1330 10/03/18 1400  BP: 122/65 124/73 118/63 121/65  Pulse: 91 (!) 101 92 95  Resp: 18 16 16 18   Temp:      TempSrc:      SpO2:      Weight:      Height:        Dilation: 6 Effacement (%): 70 Cervical Position: Middle Station: -2 Presentation: Vertex Exam by:: SRussell, RN  FHT: baseline rate 140-150s, moderate varibility, +acel, subtle early decel occasionally  Toco: Every 2-5 min   Labs: Lab Results  Component Value Date   WBC 11.0 (H) 10/03/2018   HGB 11.5 (L) 10/03/2018   HCT 34.3 (L) 10/03/2018   MCV 86.8 10/03/2018   PLT 154 10/03/2018    Patient Active Problem List   Diagnosis Date Noted  . Severe hypertension affecting pregnancy, antepartum, third trimester 10/03/2018  . Preterm labor in third trimester without delivery 09/26/2018  . Gestational hypertension, third trimester 09/21/2018  . Unwanted fertility 08/11/2018  . History of cocaine use 07/28/2018  . Kidney stones 07/07/2018  . History of preterm delivery, currently pregnant in second trimester 06/30/2018  . Supervision of high risk pregnancy, antepartum 04/07/2018  . Advanced maternal age in multigravida 04/07/2018  . Prediabetes   . HLD (hyperlipidemia)   . TIA (transient ischemic attack) 07/30/2016  . Acute left-sided weakness 07/30/2016  . Cocaine use 01/16/2016  . Gestational diabetes mellitus, class A1 12/20/2015  . Anemia 09/11/2015  . Right ovarian cyst 08/30/2015  . History of preeclampsia, prior pregnancy, currently pregnant, third trimester 01/09/2014    Assessment  / Plan: 37 y.o. [id number] at [redacted]w[redacted]d here for IOL due to pre-e with severe features GBS negative.   Labor: IOL on pit. AROM @ 1430 with large amount of clear fluid. Will monitor closely.  Fetal Wellbeing:  Cat 1 strip  Pain Control:  Epidural  Anticipated MOD:  NSVD  Pre-e with severe features: Stable, BP wnl.  A1GDM: Stable, last glucose 111. Monitor q2.   Leticia Penna, D.O. Family Medicine PGY-1  10/03/2018, 2:33 PM

## 2018-10-03 NOTE — Anesthesia Preprocedure Evaluation (Signed)
Anesthesia Evaluation    Reviewed: Allergy & Precautions, Patient's Chart, lab work & pertinent test results  Airway Mallampati: II       Dental no notable dental hx.    Pulmonary    Pulmonary exam normal        Cardiovascular hypertension, Normal cardiovascular exam     Neuro/Psych    GI/Hepatic GERD  ,  Endo/Other  diabetes  Renal/GU Renal disease     Musculoskeletal   Abdominal Normal abdominal exam  (+)   Peds  Hematology   Anesthesia Other Findings   Reproductive/Obstetrics (+) Pregnancy                            Anesthesia Physical Anesthesia Plan  ASA: II  Anesthesia Plan: Epidural   Post-op Pain Management:    Induction:   PONV Risk Score and Plan:   Airway Management Planned: Natural Airway  Additional Equipment: None  Intra-op Plan:   Post-operative Plan:   Informed Consent: I have reviewed the patients History and Physical, chart, labs and discussed the procedure including the risks, benefits and alternatives for the proposed anesthesia with the patient or authorized representative who has indicated his/her understanding and acceptance.     Plan Discussed with:   Anesthesia Plan Comments: (Lab Results      Component                Value               Date                      WBC                      11.0 (H)            10/03/2018                HGB                      11.5 (L)            10/03/2018                HCT                      34.3 (L)            10/03/2018                MCV                      86.8                10/03/2018                PLT                      154                 10/03/2018           )       Anesthesia Quick Evaluation

## 2018-10-03 NOTE — Progress Notes (Signed)
LABOR PROGRESS NOTE  [name] is a 37 y.o. [id number] at [redacted]w[redacted]d  admitted for IOL for pre-e with severe features.   Subjective: Doing well, feeling contractions more frequently.   Objective: BP 128/60   Pulse 97   Temp 97.8 F (36.6 C) (Oral)   Resp 16   Ht 5\' 3"  (1.6 m)   Wt 82.1 kg   LMP 01/12/2018 (Exact Date)   SpO2 97%   BMI 32.06 kg/m  or  Vitals:   10/03/18 0910 10/03/18 0930 10/03/18 1000 10/03/18 1030  BP: 134/71 (!) 121/43 134/74 128/60  Pulse: 100 (!) 108 (!) 107 97  Resp: 18 20 18 16   Temp:      TempSrc:      SpO2: 97%     Weight:      Height:        Dilation: 5 Effacement (%): 60 Cervical Position: Middle Station: -2 Presentation: Vertex Exam by:: SRussell, RN  FHT: baseline rate 150, moderate varibility, -acel, subtle frequent early decels Toco: Every 4-5 min   Labs: Lab Results  Component Value Date   WBC 11.0 (H) 10/03/2018   HGB 11.5 (L) 10/03/2018   HCT 34.3 (L) 10/03/2018   MCV 86.8 10/03/2018   PLT 154 10/03/2018    Patient Active Problem List   Diagnosis Date Noted  . Severe hypertension affecting pregnancy, antepartum, third trimester 10/03/2018  . Preterm labor in third trimester without delivery 09/26/2018  . Gestational hypertension, third trimester 09/21/2018  . Unwanted fertility 08/11/2018  . History of cocaine use 07/28/2018  . Kidney stones 07/07/2018  . History of preterm delivery, currently pregnant in second trimester 06/30/2018  . Supervision of high risk pregnancy, antepartum 04/07/2018  . Advanced maternal age in multigravida 04/07/2018  . Prediabetes   . HLD (hyperlipidemia)   . TIA (transient ischemic attack) 07/30/2016  . Acute left-sided weakness 07/30/2016  . Cocaine use 01/16/2016  . Gestational diabetes mellitus, class A1 12/20/2015  . Anemia 09/11/2015  . Right ovarian cyst 08/30/2015  . History of preeclampsia, prior pregnancy, currently pregnant, third trimester 01/09/2014    Assessment /  Plan: 37 y.o. [id number] at [redacted]w[redacted]d here for IOL due to preeclampsia with severe features. GBS negative.   Labor: IOL with pit. Serial cervical checks.  Fetal Wellbeing:  Cat 1 strip  Pain Control:  Epidural  Anticipated MOD:  NSVD  1. Pre-e with severe features: Stable. Last BP 128/60. Not currently endorsing HA.   -Monitor BP, IV labetalol prn SBP >160, DBP >110   -Mg IV 2 g   2. A1GDM: Stable, last glucose 117.   -CBG q4 during latent labor, q2 during active   Leticia Penna, D.O. Family Medicine PGY-1  10/03/2018, 10:42 AM

## 2018-10-04 LAB — RPR: RPR Ser Ql: NONREACTIVE

## 2018-10-04 LAB — GLUCOSE, CAPILLARY: Glucose-Capillary: 126 mg/dL — ABNORMAL HIGH (ref 70–99)

## 2018-10-04 MED ORDER — LACTATED RINGERS IV SOLN
INTRAVENOUS | Status: DC
  Administered 2018-10-04: 100 mL/h via INTRAVENOUS

## 2018-10-04 NOTE — Progress Notes (Signed)
Post Partum Day 1 Subjective: no complaints, up ad lib, voiding and tolerating PO  Objective: Blood pressure (!) 107/56, pulse 81, temperature 98.3 F (36.8 C), temperature source Oral, resp. rate 17, height 5\' 3"  (1.6 m), weight 82.1 kg, last menstrual period 01/12/2018, SpO2 99 %, unknown if currently breastfeeding.  Physical Exam:  General: alert, cooperative and appears stated age Lochia: appropriate Uterine Fundus: firm DVT Evaluation: No evidence of DVT seen on physical exam.  Recent Labs    10/03/18 0214 10/03/18 1802  HGB 11.5* 10.3*  HCT 34.3* 31.2*    Assessment/Plan: Plan for discharge tomorrow and Contraception Depo  Continue Magnesium x 24 hours pp Monitor BP--ok in last 24 hours   LOS: 1 day   Reva Bores 10/04/2018, 7:57 AM

## 2018-10-04 NOTE — Anesthesia Postprocedure Evaluation (Signed)
Anesthesia Post Note  Patient: [name]  Procedure(s) Performed: AN AD HOC LABOR EPIDURAL     Patient location during evaluation: Mother Baby Anesthesia Type: Epidural Level of consciousness: awake and alert and oriented Pain management: satisfactory to patient Vital Signs Assessment: post-procedure vital signs reviewed and stable Respiratory status: respiratory function stable Cardiovascular status: stable Postop Assessment: no headache, no backache, epidural receding, patient able to bend at knees, no signs of nausea or vomiting and adequate PO intake Anesthetic complications: no    Last Vitals:  Vitals:   10/04/18 0335 10/04/18 0743  BP: 112/71 (!) 107/56  Pulse: 81 81  Resp: 17 17  Temp: 36.5 C 36.8 C  SpO2: 99% 99%    Last Pain:  Vitals:   10/04/18 0743  TempSrc: Oral  PainSc:    Pain Goal: Patients Stated Pain Goal: 4 (10/04/18 0630)               Karleen Dolphin

## 2018-10-05 ENCOUNTER — Other Ambulatory Visit: Payer: Medicaid Other

## 2018-10-05 MED ORDER — IBUPROFEN 600 MG PO TABS: 600.0000 mg | ORAL_TABLET | Freq: Four times a day (QID) | ORAL | 1 refills | Status: DC

## 2018-10-05 NOTE — Plan of Care (Signed)
Discharge instructions given, Baby and me book reviewed, pre-eclampsia s/s, PP care reviewed. Pt verbalizes understanding.

## 2018-10-05 NOTE — Discharge Summary (Signed)
Physician Discharge Summary  Patient ID: [name] MRN: [id number] DOB/AGE: 1981-05-30 37 y.o.  Admit date: 10/03/2018 Discharge date: 10/05/2018   Discharge Diagnoses:  Principal Problem:   Severe hypertension affecting pregnancy, antepartum, third trimester Active Problems:   History of preeclampsia, prior pregnancy, currently pregnant, third trimester   Gestational diabetes mellitus, class A1   Cocaine use   Advanced maternal age in multigravida   History of preterm delivery, currently pregnant in second trimester   History of cocaine use   Gestational hypertension, third trimester   Hospital Course: Please see HPI dated 10/03/2018 for details. This is a 37 y.o. [id number] now PPD#2 from a spontaneous vaginal delivery. She presented with headache and was kept for induction for gestational hypertension with severe features. Her antepartum course was complicated by advanced pre-term cervical dilation for which she had several admissions, gDMA1, gestational HTN, h/o cocaine use with all negative UDS during current pregnancy. Her post partum course was uncomplicated and she received 24 hrs MgSO4 post partum, BP well controlled on no additional medication. By day 2, she was ambulating, passing flatus and pain was well controlled. She was discharged home PPD#2 in good condition. Baby is doing well and will be discharged home with patient. Instructions for follow up given, she will need a BP check in 3-4 days.    Physical exam  Vitals:   10/04/18 1651 10/04/18 1934 10/04/18 2322 10/05/18 0457  BP: 130/76 126/69 (!) 116/55 121/72  Pulse: 80 81 86 71  Resp: 18 17 17 17   Temp: 98.3 F (36.8 C) 98.1 F (36.7 C) 98 F (36.7 C) 97.8 F (36.6 C)  TempSrc: Oral Oral Oral Oral  SpO2: 100% 100% 100% 100%  Weight:      Height:       Physical Exam:  General: alert, oriented, cooperative Chest: normal respiratory effort Heart: RRR  Abdomen: soft, appropriately tender to  palpation  Uterine Fundus: firm, 2 fingers below the umbilicus Lochia: moderate, rubra DVT Evaluation: no evidence of DVT Extremities: no edema, no calf tenderness   Postpartum contraception: IUD undecided  Disposition: home  Discharged Condition: good  Discharge Instructions    Call MD for:  difficulty breathing, headache or visual disturbances   Complete by:  As directed    Call MD for:  extreme fatigue   Complete by:  As directed    Call MD for:  hives   Complete by:  As directed    Call MD for:  persistant dizziness or light-headedness   Complete by:  As directed    Call MD for:  persistant nausea and vomiting   Complete by:  As directed    Call MD for:  redness, tenderness, or signs of infection (pain, swelling, redness, odor or green/yellow discharge around incision site)   Complete by:  As directed    Call MD for:  severe uncontrolled pain   Complete by:  As directed    Call MD for:  temperature >100.4   Complete by:  As directed    Diet - low sodium heart healthy   Complete by:  As directed      Allergies as of 10/05/2018   No Known Allergies     Medication List    STOP taking these medications   acetaminophen 325 MG tablet Commonly known as:  TYLENOL   calcium carbonate 500 MG chewable tablet Commonly known as:  TUMS - dosed in mg elemental calcium   DICLEGIS 10-10 MG Tbec Generic drug:  Doxylamine-Pyridoxine   NIFEdipine 30 MG 24 hr tablet Commonly known as:  ADALAT CC   prenatal multivitamin Tabs tablet     TAKE these medications   ibuprofen 600 MG tablet Commonly known as:  ADVIL,MOTRIN Take 1 tablet (600 mg total) by mouth every 6 (six) hours.      Follow-up Information    [facility]. Schedule an appointment as soon as possible for a visit in 3 week(s).   Specialty:  Obstetrics and Gynecology Why:  and in four weeks Contact information: [address] [place]  [address] [phone]         Signed: Baldemar Lenis, M.D. [facility]  10/05/2018, 7:49 AM

## 2018-10-08 ENCOUNTER — Other Ambulatory Visit: Payer: Medicaid Other

## 2018-10-08 ENCOUNTER — Telehealth: Payer: Self-pay

## 2018-10-08 ENCOUNTER — Encounter: Payer: Medicaid Other | Admitting: Obstetrics and Gynecology

## 2018-10-08 NOTE — Telephone Encounter (Signed)
Return call to pt regarding message. Pt delivered Sat 10/03/18 NSVD at 36 wks No longer breast feeding only given IBP 600mg  Pt c/o breast discomfort and cramping wants pain medication  Pt states she has tried different measures to help w/ breast engorgement  Pt has tried frozen cabbage leaves,tight sports bra, breast  massages and warm showers.no relief and still having throbbing and milk not letting down. Pt denies any fever.  Please advise should pt go to MAU or is there something more she can do? Pharmacy is [facility] in [place] if Rx can be sent.

## 2018-10-09 ENCOUNTER — Inpatient Hospital Stay (HOSPITAL_COMMUNITY): Admission: RE | Admit: 2018-10-09 | Payer: Medicaid Other | Source: Ambulatory Visit

## 2018-10-09 NOTE — Telephone Encounter (Signed)
F/u call to pt today she is still having same sx's no let down with engorgement  Pt advised if pain and discomfort after all measure to go back to MAU No Rx sent at this time per Dr.Harper IBP 600 mg is fine for pt to continue.

## 2018-10-12 ENCOUNTER — Other Ambulatory Visit: Payer: Medicaid Other

## 2018-10-15 ENCOUNTER — Other Ambulatory Visit: Payer: Medicaid Other

## 2018-10-15 ENCOUNTER — Encounter: Payer: Medicaid Other | Admitting: Obstetrics & Gynecology

## 2019-07-18 ENCOUNTER — Other Ambulatory Visit: Payer: Self-pay

## 2019-07-18 ENCOUNTER — Emergency Department (HOSPITAL_COMMUNITY): Payer: Self-pay

## 2019-07-18 ENCOUNTER — Emergency Department (HOSPITAL_COMMUNITY)
Admission: EM | Admit: 2019-07-18 | Discharge: 2019-07-19 | Disposition: A | Discharge status: Home / Self Care | Payer: Self-pay | Attending: Emergency Medicine | Admitting: Emergency Medicine

## 2019-07-18 ENCOUNTER — Encounter (HOSPITAL_COMMUNITY): Payer: Self-pay | Admitting: Emergency Medicine

## 2019-07-18 DIAGNOSIS — R059 Cough, unspecified: Secondary | ICD-10-CM

## 2019-07-18 DIAGNOSIS — R05 Cough: Secondary | ICD-10-CM | POA: Insufficient documentation

## 2019-07-18 DIAGNOSIS — R0602 Shortness of breath: Secondary | ICD-10-CM | POA: Insufficient documentation

## 2019-07-18 DIAGNOSIS — R0789 Other chest pain: Secondary | ICD-10-CM | POA: Insufficient documentation

## 2019-07-18 DIAGNOSIS — M791 Myalgia, unspecified site: Secondary | ICD-10-CM | POA: Insufficient documentation

## 2019-07-18 DIAGNOSIS — R112 Nausea with vomiting, unspecified: Secondary | ICD-10-CM | POA: Insufficient documentation

## 2019-07-18 DIAGNOSIS — R52 Pain, unspecified: Secondary | ICD-10-CM

## 2019-07-18 DIAGNOSIS — Z20828 Contact with and (suspected) exposure to other viral communicable diseases: Secondary | ICD-10-CM | POA: Insufficient documentation

## 2019-07-18 LAB — CBC WITH DIFFERENTIAL/PLATELET
Abs Immature Granulocytes: 0.03 10*3/uL (ref 0.00–0.07)
Basophils Absolute: 0.1 10*3/uL (ref 0.0–0.1)
Basophils Relative: 1 %
Eosinophils Absolute: 0.3 10*3/uL (ref 0.0–0.5)
Eosinophils Relative: 3 %
HCT: 37.6 % (ref 36.0–46.0)
Hemoglobin: 11.5 g/dL — ABNORMAL LOW (ref 12.0–15.0)
Immature Granulocytes: 0 %
Lymphocytes Relative: 25 %
Lymphs Abs: 2.1 10*3/uL (ref 0.7–4.0)
MCH: 25.7 pg — ABNORMAL LOW (ref 26.0–34.0)
MCHC: 30.6 g/dL (ref 30.0–36.0)
MCV: 84.1 fL (ref 80.0–100.0)
Monocytes Absolute: 0.5 10*3/uL (ref 0.1–1.0)
Monocytes Relative: 6 %
Neutro Abs: 5.4 10*3/uL (ref 1.7–7.7)
Neutrophils Relative %: 65 %
Platelets: 314 10*3/uL (ref 150–400)
RBC: 4.47 MIL/uL (ref 3.87–5.11)
RDW: 14.6 % (ref 11.5–15.5)
WBC: 8.3 10*3/uL (ref 4.0–10.5)
nRBC: 0 % (ref 0.0–0.2)

## 2019-07-18 LAB — BASIC METABOLIC PANEL
Anion gap: 8 (ref 5–15)
BUN: 13 mg/dL (ref 6–20)
CO2: 26 mmol/L (ref 22–32)
Calcium: 8.5 mg/dL — ABNORMAL LOW (ref 8.9–10.3)
Chloride: 104 mmol/L (ref 98–111)
Creatinine, Ser: 0.81 mg/dL (ref 0.44–1.00)
GFR calc Af Amer: >60 mL/min (ref >60)
GFR calc non Af Amer: >60 mL/min (ref >60)
Glucose, Bld: 109 mg/dL — ABNORMAL HIGH (ref 70–99)
Potassium: 3.5 mmol/L (ref 3.5–5.1)
Sodium: 138 mmol/L (ref 135–145)

## 2019-07-18 LAB — POC URINE PREG, ED: Preg Test, Ur: NEGATIVE

## 2019-07-18 MED ORDER — DIPHENHYDRAMINE HCL 50 MG/ML IJ SOLN
25.0000 mg | Freq: Once | INTRAMUSCULAR | Status: AC
  Administered 2019-07-18: 25 mg via INTRAVENOUS
  Filled 2019-07-18: qty 1

## 2019-07-18 MED ORDER — METOCLOPRAMIDE HCL 5 MG/ML IJ SOLN
10.0000 mg | Freq: Once | INTRAMUSCULAR | Status: AC
  Administered 2019-07-18: 10 mg via INTRAVENOUS
  Filled 2019-07-18: qty 2

## 2019-07-18 MED ORDER — FAMOTIDINE IN NACL 20-0.9 MG/50ML-% IV SOLN
20.0000 mg | Freq: Once | INTRAVENOUS | Status: AC
  Administered 2019-07-18: 20 mg via INTRAVENOUS
  Filled 2019-07-18: qty 50

## 2019-07-18 MED ORDER — ALUM & MAG HYDROXIDE-SIMETH 200-200-20 MG/5ML PO SUSP
15.0000 mL | Freq: Once | ORAL | Status: AC
  Administered 2019-07-18: 15 mL via ORAL
  Filled 2019-07-18: qty 30

## 2019-07-18 MED ORDER — DOXYCYCLINE HYCLATE 100 MG PO TABS
100.0000 mg | ORAL_TABLET | Freq: Once | ORAL | Status: AC
  Administered 2019-07-18: 100 mg via ORAL
  Filled 2019-07-18: qty 1

## 2019-07-18 MED ORDER — SODIUM CHLORIDE 0.9 % IV BOLUS
1000.0000 mL | Freq: Once | INTRAVENOUS | Status: AC
  Administered 2019-07-18: 1000 mL via INTRAVENOUS

## 2019-07-18 MED ORDER — ALBUTEROL SULFATE HFA 108 (90 BASE) MCG/ACT IN AERS
1.0000 | INHALATION_SPRAY | Freq: Once | RESPIRATORY_TRACT | Status: AC
  Administered 2019-07-18: 23:00:00 1 via RESPIRATORY_TRACT
  Filled 2019-07-18: qty 6.7

## 2019-07-18 MED ORDER — ONDANSETRON HCL 4 MG/2ML IJ SOLN
4.0000 mg | Freq: Once | INTRAMUSCULAR | Status: AC
  Administered 2019-07-18: 4 mg via INTRAVENOUS
  Filled 2019-07-18: qty 2

## 2019-07-18 NOTE — ED Notes (Signed)
Respiratory called for inhaler

## 2019-07-18 NOTE — Discharge Instructions (Addendum)
Please see the information and instructions below regarding your visit.  Your diagnoses today include:  1. Cough   2. Body aches   3. Chest wall discomfort   4. Non-intractable vomiting with nausea, unspecified vomiting type     You are diagnosed today with a viral respiratory illness. One of the major respiratory illnesses of concern circulating in our community is COVID-19. The Novel Coronavirus 2019, was first reported on in [place] in late December 2019.  The outbreak was declared a public health emergency of international concern in January 2020 and on March 11th, 2020, the outbreak was declared a global pandemic.  The spread of this virus is now global with lots of media attention.  The virus has been named SARS-CoV-2 and the disease it causes has become known as coronavirus disease 2019 (COVID-19). Although symptoms can be variable, the predominant symptoms are fever, cough, and shortness of breath.   The incubation period (period during which the patient has the disease and can spread it to others but does not have symptoms of disease) is from 5-14 days.   [place] is considered to have "community spread" of COVID-19. This means that we are seeing cases of COVID-19 that we cannot connect to other known positive cases. Please refer to the information below on Home Care and from the [place] [facility] to guide you on keeping your family safe and returning to normal activity.  Tests performed today include: See side panel of your discharge paperwork for testing performed today. Vital signs are listed at the bottom of these instructions.   COVID test will return in 2 to 3 days.  Please follow the result on my chart.  Medications prescribed:    Take any prescribed medications only as prescribed, and any over the counter medications only as directed on the packaging.  There is ongoing research on the best medication to take for fever with COVID-19. Some  preliminary information suggested a hypothetical adverse response to non-steroidal anti-inflammatory medications (ibuprofen/Motrin/Advil, or naproxen/Aleve). This is not proven in literature just yet. It is reasonable to initially try to control fever with Acetaminophen/Tylenol, 650 mg every 4 hours or 1000 mg every 6 hours. DO NOT EXCEED 4000 MG OF ACETAMINOPHEN/TYLENOL IN ONE DAY AS IT CAN HARM THE LIVER.  If fever and body aches are not not controlled on Acetaminophen/Tylenol alone and you can safely take non-steroidal anti-inflammatories, I recommend ibuprofen 600 mg every 6 hours as needed. This is not a long-term medication unless under the care and direction of your primary provider. Taking this medication long-term and not under the supervision of a healthcare provider could increase the risk of stomach ulcers, kidney problems, and cardiovascular problems such as high blood pressure.   Please take Zofran under the tongue every 8 hours as needed for nausea and vomiting.  Doxycycline is an antibiotic that fights infection in the lung. This medication can make your skin sensitive to the sun, so please ensure that you wear sunscreen, hats, or other coverage over your skin while taking this. This medicine CANNOT be taken by women while pregnant, breastfeeding, or trying to become pregnant.  Please speak with a healthcare provider if any of these situations apply to you.  Please take all of your antibiotics until finished.   You may develop abdominal discomfort or nausea from the antibiotic. If this occurs, you may take it with food. Some patients also get diarrhea with antibiotics. You may help offset this with  probiotics which you can buy or get in yogurt. Do not eat or take the probiotics until 2 hours after your antibiotic. Some women develop vaginal yeast infections after antibiotics. If you develop unusual vaginal discharge after being on this medication, please see your primary care provider.    Some people develop allergies to antibiotics. Symptoms of antibiotic allergy can be mild and include a flat rash and itching. They can also be more serious and include:  ?Hives - Hives are raised, red patches of skin that are usually very itchy.  ?Lip or tongue swelling  ?Trouble swallowing or breathing  ?Blistering of the skin or mouth.  If you have any of these serious symptoms, please seek emergency medical care immediately.  Home care instructions:  Please follow any educational materials contained in this packet.   Since we do not have the ability to widely test patients for COVID-19, the Centers for Disease Control and Prevention (CDC) recommend the following guidelines currently for self-quarantine during a respiratory illness:   -At least 3 days (72 hours) have passed since recovery defined as resolution of fever without the use of fever-reducing medications and improvement in respiratory symptoms (e.g., cough, shortness of breath); and, -At least 10 days have passed since symptoms first appeared.  Your illness is contagious and can be spread to others, especially during the phase of illness during which you have a fever. It cannot be cured by antibiotics or other medicines. Take basic precautions such as washing your hands often, covering your mouth when you cough or sneeze, and avoiding public places where you could spread your illness to others.   Please continue drinking plenty of fluids.  Use over-the-counter medicines as needed as directed on packaging for symptom relief.  You may also use ibuprofen or tylenol as directed on packaging for pain or fever.  Do not take multiple medicines containing Tylenol or acetaminophen to avoid taking too much of this medication.  Follow-up instructions:   Return instructions:  Please return to the Emergency Department if you experience worsening symptoms.  RETURN IMMEDIATELY IF you develop shortness of breath, chest pain, confusion or  altered mental status, a new rash, become dizzy, faint, or poorly responsive, or are unable to be cared for at home. Some patients develop bacterial pneumonia as a result of viral respiratory infections. Signs and symptoms include return of fever, increased production of green or yellow phlegm, chest pain, shortness of breath. You need to be reevaluated if you experience these symptoms. Please return if you have persistent vomiting and cannot keep down fluids or develop a fever that is not controlled by Acetaminophen (Tylenol) or Ibuprofen (Motrin). Please return if you have any other emergent concerns.   Additional Information:   Your vital signs today were: BP 130/83    Pulse 91    Temp 98.1 F (36.7 C) (Oral)    Resp 18    Ht 5\' 3"  (1.6 m)    Wt 65.8 kg    LMP 06/16/2019    SpO2 100%    BMI 25.69 kg/m  If your blood pressure (BP) was elevated on multiple readings during this visit above 130 for the top number or above 80 for the bottom number, please have this repeated by your primary care provider within one month. --------------  Thank you for allowing [place] to participate in your care today.        Individuals who are confirmed to have, or are being evaluated for, COVID-19 should follow the prevention  steps below until a healthcare provider or local or state health department says they can return to normal activities.  Stay home except to get medical care You should restrict activities outside your home, except for getting medical care. Do not go to work, school, or public areas, and do not use public transportation or taxis.  Call ahead before visiting your doctor Before your medical appointment, call the healthcare provider and tell them that you have, or are being evaluated for, COVID-19 infection. This will help the healthcare providers office take steps to keep other people from getting infected. Ask your healthcare provider to call the local or state health  department.  Monitor your symptoms Seek prompt medical attention if your illness is worsening (e.g., difficulty breathing). Before going to your medical appointment, call the healthcare provider and tell them that you have, or are being evaluated for, COVID-19 infection. Ask your healthcare provider to call the local or state health department.  Wear a facemask You should wear a facemask that covers your nose and mouth when you are in the same room with other people and when you visit a healthcare provider. People who live with or visit you should also wear a facemask while they are in the same room with you.  Separate yourself from other people in your home As much as possible, you should stay in a different room from other people in your home. Also, you should use a separate bathroom, if available.  Avoid sharing household items You should not share dishes, drinking glasses, cups, eating utensils, towels, bedding, or other items with other people in your home. After using these items, you should wash them thoroughly with soap and water.  Cover your coughs and sneezes Cover your mouth and nose with a tissue when you cough or sneeze, or you can cough or sneeze into your sleeve. Throw used tissues in a lined trash can, and immediately wash your hands with soap and water for at least 20 seconds or use an alcohol-based hand rub.  Wash your [employer] Wash your hands often and thoroughly with soap and water for at least 20 seconds. You can use an alcohol-based hand sanitizer if soap and water are not available and if your hands are not visibly dirty. Avoid touching your eyes, nose, and mouth with unwashed hands.   Prevention Steps for Caregivers and Household Members of Individuals Confirmed to have, or Being Evaluated for, COVID-19 Infection Being Cared for in the Home  If you live with, or provide care at home for, a person confirmed to have, or being evaluated for, COVID-19  infection please follow these guidelines to prevent infection:  Follow healthcare providers instructions Make sure that you understand and can help the patient follow any healthcare provider instructions for all care.  Provide for the patients basic needs You should help the patient with basic needs in the home and provide support for getting groceries, prescriptions, and other personal needs.  Monitor the patients symptoms If they are getting sicker, call his or her medical provider and tell them that the patient has, or is being evaluated for, COVID-19 infection. This will help the healthcare providers office take steps to keep other people from getting infected. Ask the healthcare provider to call the local or state health department.  Limit the number of people who have contact with the patient If possible, have only one caregiver for the patient. Other household members should stay in another home or place of residence. If this is  not possible, they should stay in another room, or be separated from the patient as much as possible. Use a separate bathroom, if available. Restrict visitors who do not have an essential need to be in the home.  Keep older adults, very young children, and other sick people away from the patient Keep older adults, very young children, and those who have compromised immune systems or chronic health conditions away from the patient. This includes people with chronic heart, lung, or kidney conditions, diabetes, and cancer.  Ensure good ventilation Make sure that shared spaces in the home have good air flow, such as from an air conditioner or an opened window, weather permitting.  Wash your hands often Wash your hands often and thoroughly with soap and water for at least 20 seconds. You can use an alcohol based hand sanitizer if soap and water are not available and if your hands are not visibly dirty. Avoid touching your eyes, nose, and mouth with  unwashed hands. Use disposable paper towels to dry your hands. If not available, use dedicated cloth towels and replace them when they become wet.  Wear a facemask and gloves Wear a disposable facemask at all times in the room and gloves when you touch or have contact with the patients blood, body fluids, and/or secretions or excretions, such as sweat, saliva, sputum, nasal mucus, vomit, urine, or feces.  Ensure the mask fits over your nose and mouth tightly, and do not touch it during use. Throw out disposable facemasks and gloves after using them. Do not reuse. Wash your hands immediately after removing your facemask and gloves. If your personal clothing becomes contaminated, carefully remove clothing and launder. Wash your hands after handling contaminated clothing. Place all used disposable facemasks, gloves, and other waste in a lined container before disposing them with other household waste. Remove gloves and wash your hands immediately after handling these items.  Do not share dishes, glasses, or other household items with the patient Avoid sharing household items. You should not share dishes, drinking glasses, cups, eating utensils, towels, bedding, or other items with a patient who is confirmed to have, or being evaluated for, COVID-19 infection. After the person uses these items, you should wash them thoroughly with soap and water.  Wash laundry thoroughly Immediately remove and wash clothes or bedding that have blood, body fluids, and/or secretions or excretions, such as sweat, saliva, sputum, nasal mucus, vomit, urine, or feces, on them. Wear gloves when handling laundry from the patient. Read and follow directions on labels of laundry or clothing items and detergent. In general, wash and dry with the warmest temperatures recommended on the label.  Clean all areas the individual has used often Clean all touchable surfaces, such as counters, tabletops, doorknobs, bathroom fixtures,  toilets, phones, keyboards, tablets, and bedside tables, every day. Also, clean any surfaces that may have blood, body fluids, and/or secretions or excretions on them. Wear gloves when cleaning surfaces the patient has come in contact with. Use a diluted bleach solution (e.g., dilute bleach with 1 part bleach and 10 parts water) or a household disinfectant with a label that says EPA-registered for coronaviruses. To make a bleach solution at home, add 1 tablespoon of bleach to 1 quart (4 cups) of water. For a larger supply, add  cup of bleach to 1 gallon (16 cups) of water. Read labels of cleaning products and follow recommendations provided on product labels. Labels contain instructions for safe and effective use of the cleaning product including precautions  you should take when applying the product, such as wearing gloves or eye protection and making sure you have good ventilation during use of the product. Remove gloves and wash hands immediately after cleaning.  Monitor yourself for signs and symptoms of illness Caregivers and household members are considered close contacts, should monitor their health, and will be asked to limit movement outside of the home to the extent possible. Follow the monitoring steps for close contacts listed on the symptom monitoring form.   ? If you have additional questions, contact your local health department or call the epidemiologist on call at [phone] (available 24/7). ? This guidance is subject to change. For the most up-to-date guidance from [facility], please refer to their website: [employer]Met[employer].huhttps://www.cdc.gov/coronavirus/2019-ncov/hcp/guidance-prevent-spread.html

## 2019-07-18 NOTE — ED Triage Notes (Signed)
Pt c/o sob, body aches, cough, nausea, fatigue, chest pressure x 4 days

## 2019-07-18 NOTE — ED Notes (Signed)
Water given  

## 2019-07-18 NOTE — ED Provider Notes (Signed)
[facility] Provider Note   CSN: [id number] Arrival date & time: 07/18/19  1952     History   Chief Complaint Chief Complaint  Patient presents with  . possible covid    HPI [name] is a 38 y.o. female.     HPI  Patient is a 38-year female with past medical history of gestational diabetes, gestational hypertension presenting for cough, central chest pain on coughing, nausea, vomiting, body aches.  Patient reports that all the symptoms began about 48 hours ago.  She reports that she initially had a sore throat, some "burning" central chest pain and then a nonproductive cough develop.  She has had ongoing nausea with vomiting, nonbilious, although she may have had a streak of blood in her most recent episode.  TMax at home 100 and patient has had diffuse body aches. Patient does not think she has had any exposures to COVID-19.  She has 6 children, one works outside the home in a grocery store but is cautious about sanitizing and using personal protective equipment.  Patient does not work outside the home.  Patient denies any history DVT/PE, hormone use, recent mobilization, hospitalization, recent surgery, lower extremity edema or calf tenderness or hemoptysis.  Past Medical History:  Diagnosis Date  . Diabetes mellitus without complication (HCC)    during pregnancy  . GERD (gastroesophageal reflux disease)   . Gestational hypertension   . History of cocaine use   . Kidney stones   . Ovarian cyst   . Pregnancy induced hypertension     Patient Active Problem List   Diagnosis Date Noted  . Severe hypertension affecting pregnancy, antepartum, third trimester 10/03/2018  . Preterm labor in third trimester without delivery 09/26/2018  . Gestational hypertension, third trimester 09/21/2018  . Unwanted fertility 08/11/2018  . History of cocaine use 07/28/2018  . Kidney stones 07/07/2018  . History of preterm delivery, currently pregnant in  second trimester 06/30/2018  . Supervision of high risk pregnancy, antepartum 04/07/2018  . Advanced maternal age in multigravida 04/07/2018  . Prediabetes   . HLD (hyperlipidemia)   . TIA (transient ischemic attack) 07/30/2016  . Acute left-sided weakness 07/30/2016  . Cocaine use 01/16/2016  . Gestational diabetes mellitus, class A1 12/20/2015  . Anemia 09/11/2015  . Right ovarian cyst 08/30/2015  . History of preeclampsia, prior pregnancy, currently pregnant, third trimester 01/09/2014    Past Surgical History:  Procedure Laterality Date  . APPENDECTOMY    . HEMORRHOID SURGERY N/A 01/19/2016   Procedure: EXTENSIVE HEMORRHOIDECTOMY;  Surgeon: Franky MachoMark Jenkins, MD;  Location: [facility];  Service: General;  Laterality: N/A;  . kidney stents    . LITHOTRIPSY       OB History    Gravida  6   Para  6   Term  4   Preterm  2   AB      Living  6     SAB      TAB      Ectopic      Multiple  0   Live Births  6            Home Medications    Prior to Admission medications   Medication Sig Start Date End Date Taking? Authorizing Provider  ibuprofen (ADVIL,MOTRIN) 600 MG tablet Take 1 tablet (600 mg total) by mouth every 6 (six) hours. 10/05/18  Yes Conan Bowensavis, Kelly M, MD    Family History Family History  Problem Relation Age of Onset  .  Cancer Mother        breast  . Diabetes Maternal Grandmother     Social History Social History   Tobacco Use  . Smoking status: Never Smoker  . Smokeless tobacco: Never Used  Substance Use Topics  . Alcohol use: No  . Drug use: No    Comment: Hx Cocaine use     Allergies   Patient has no known allergies.   Review of Systems Review of Systems  Constitutional: Negative for chills and fever.  HENT: Positive for sore throat. Negative for congestion, rhinorrhea and sinus pain.   Eyes: Negative for visual disturbance.  Respiratory: Positive for cough. Negative for chest tightness and shortness of breath.   Cardiovascular:  Positive for chest pain. Negative for palpitations and leg swelling.       +CP on coughing  Gastrointestinal: Positive for nausea and vomiting. Negative for abdominal pain, constipation and diarrhea.  Genitourinary: Negative for dysuria and flank pain.  Musculoskeletal: Positive for myalgias. Negative for back pain.  Skin: Negative for rash.  Neurological: Negative for dizziness, syncope, light-headedness and headaches.     Physical Exam Updated Vital Signs BP 130/83   Pulse 91   Temp 98.1 F (36.7 C) (Oral)   Resp 18   Ht 5\' 3"  (1.6 m)   Wt 65.8 kg   LMP 06/16/2019   SpO2 100%   BMI 25.69 kg/m   Physical Exam Vitals signs and nursing note reviewed.  Constitutional:      General: She is not in acute distress.    Appearance: She is well-developed. She is not ill-appearing or diaphoretic.  HENT:     Head: Normocephalic and atraumatic.     Mouth/Throat:     Mouth: Mucous membranes are moist.     Pharynx: No oropharyngeal exudate or posterior oropharyngeal erythema.  Eyes:     Conjunctiva/sclera: Conjunctivae normal.     Pupils: Pupils are equal, round, and reactive to light.  Neck:     Musculoskeletal: Normal range of motion and neck supple.  Cardiovascular:     Rate and Rhythm: Normal rate and regular rhythm.     Heart sounds: S1 normal and S2 normal. No murmur.  Pulmonary:     Effort: Pulmonary effort is normal.     Breath sounds: Rhonchi and rales present. No wheezing.     Comments: Soft rales in bilateral lower lung bases.  Abdominal:     General: There is no distension.     Palpations: Abdomen is soft.     Tenderness: There is no abdominal tenderness. There is no guarding.  Musculoskeletal: Normal range of motion.        General: No deformity.  Lymphadenopathy:     Cervical: No cervical adenopathy.  Skin:    General: Skin is warm and dry.     Findings: No erythema or rash.  Neurological:     Mental Status: She is alert.     Comments: Cranial nerves grossly  intact. Patient moves extremities symmetrically and with good coordination.  Psychiatric:        Behavior: Behavior normal.        Thought Content: Thought content normal.        Judgment: Judgment normal.      ED Treatments / Results  Labs (all labs ordered are listed, but only abnormal results are displayed) Labs Reviewed  BASIC METABOLIC PANEL - Abnormal; Notable for the following components:      Result Value   Glucose, Bld 109 (*)  Calcium 8.5 (*)    All other components within normal limits  CBC WITH DIFFERENTIAL/PLATELET - Abnormal; Notable for the following components:   Hemoglobin 11.5 (*)    MCH 25.7 (*)    All other components within normal limits  NOVEL CORONAVIRUS, NAA (HOSPITAL ORDER, SEND-OUT TO REF LAB)  POC URINE PREG, ED    EKG None  Radiology Dg Chest Portable 1 View  Result Date: 07/18/2019 CLINICAL DATA:  Cough, shortness of breath, body aches, nausea, fatigue, chest pressure EXAM: PORTABLE CHEST 1 VIEW COMPARISON:  04/06/2013 FINDINGS: Mild bibasilar opacities, atelectasis versus pneumonia. No pleural effusion or pneumothorax. The heart is normal in size IMPRESSION: Mild bibasilar opacities, atelectasis versus pneumonia. Electronically Signed   By: Julian Hy M.D.   On: 07/18/2019 21:01    Procedures Procedures (including critical care time)  Medications Ordered in ED Medications  ondansetron (ZOFRAN) injection 4 mg (4 mg Intravenous Given 07/18/19 2210)     Initial Impression / Assessment and Plan / ED Course  I have reviewed the triage vital signs and the nursing notes.  Pertinent labs & imaging results that were available during my care of the patient were reviewed by me and considered in my medical decision making (see chart for details).        This is a well-appearing 38 year old female with no immune compromising risk factors presenting for cough, chest pain on coughing, myalgias, low-grade temperature, nausea, vomiting.   Clinical syndrome consistent with viral illness very likely COVID-19 versus bacterial pneumonia versus other viral respiratory infection.  She does have small infiltrates on chest x-ray.  She has not had any high risk exposures to COVID-19 however still very likely pathology.  Patient was actively vomiting on my exam.  She was given initial antiemetics with subsequent vomiting.  Will administer additional fluids and antiemetics.  Work-up otherwise unremarkable.  Stable and improved anemia.  No significant electrolyte abnormalities.  She is not pregnant.  We will try to stabilize nausea and vomiting to the point that patient can tolerate p.o., then discharged on doxycycline, antiemetics.  She is instructed that she must self quarantine.  COVID test is pending.  She is in full understanding.  Care signed out to Lily Kocher to reassess at 11:02 PM.   Final Clinical Impressions(s) / ED Diagnoses   Final diagnoses:  Cough  Body aches  Chest wall discomfort  Non-intractable vomiting with nausea, unspecified vomiting type    ED Discharge Orders    None       Tamala Julian 07/18/19 2302    Hayden Rasmussen, MD 07/19/19 1001

## 2019-07-19 MED ORDER — DOXYCYCLINE HYCLATE 100 MG PO CAPS: 100.0000 mg | ORAL_CAPSULE | Freq: Two times a day (BID) | ORAL | 0 refills | Status: DC

## 2019-07-19 MED ORDER — METOCLOPRAMIDE HCL 10 MG PO TABS: 10.0000 mg | ORAL_TABLET | Freq: Four times a day (QID) | ORAL | 0 refills | Status: DC

## 2019-07-19 NOTE — ED Provider Notes (Signed)
CONTINUING CARE FROM A. MURRAY, PA-C.  Patient is a 38 year old female who presents to the emergency department for possible COVID virus.  The patient has been having some temperature elevations at home.  She has been having a burning type central chest pain accompanied by nonproductive cough and generally not feeling well.  She has been having recent nausea and vomiting.  Patient is been treated for the nausea and vomiting here in the department.  The work-up is in progress.  The patient required repeat fluids and medications.  Recheck.  The patient is resting comfortably after the second dose of medication.  No further vomiting.  Patient has been able to tolerate liquids without problem.  Vital signs within normal limits.  Pulse oximetry is 100% on room air.  I reviewed with the patient again the importance of self quarantine until she received the results of her COVID-19 tests.  The patient is discharged with prescriptions for doxycycline, and Reglan for nausea/vomiting.  Patient is in agreement with this plan.  Dx. cough, body aches, chest wall discomfort, non-intractable vomiting with nausea.    Lily Kocher, PA-C 07/19/19 0033    Hayden Rasmussen, MD 07/19/19 1002

## 2019-07-20 LAB — NOVEL CORONAVIRUS, NAA (HOSP ORDER, SEND-OUT TO REF LAB; TAT 18-24 HRS): SARS-CoV-2, NAA: NOT DETECTED

## 2019-08-10 IMAGING — US US MFM OB LIMITED
1 series · 15 of 23 positions shown · non-contrast
Comparison: none

[Series 1: us mfm ob limited · 15 of 23 slices shown]
[im 1/23]
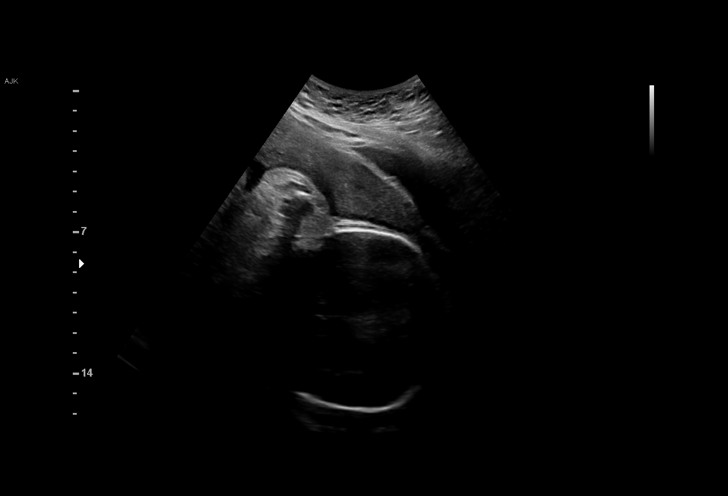
[im 3/23]
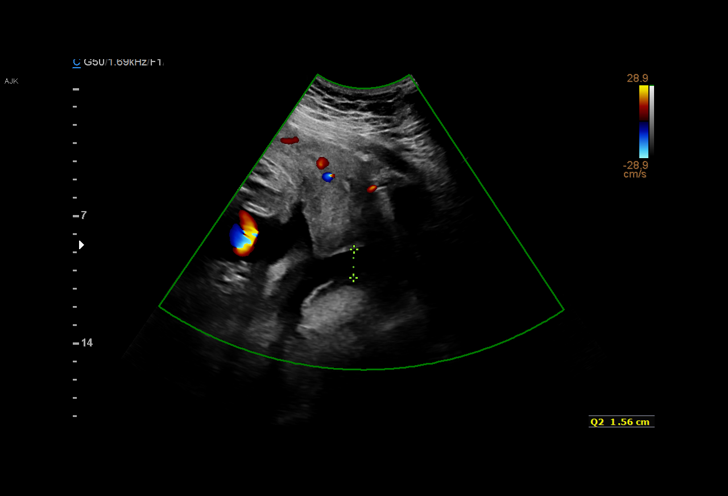
[im 4/23]
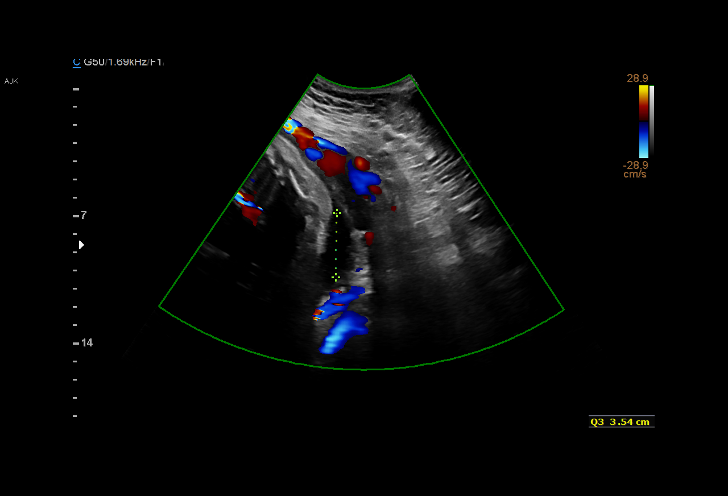
[im 6/23]
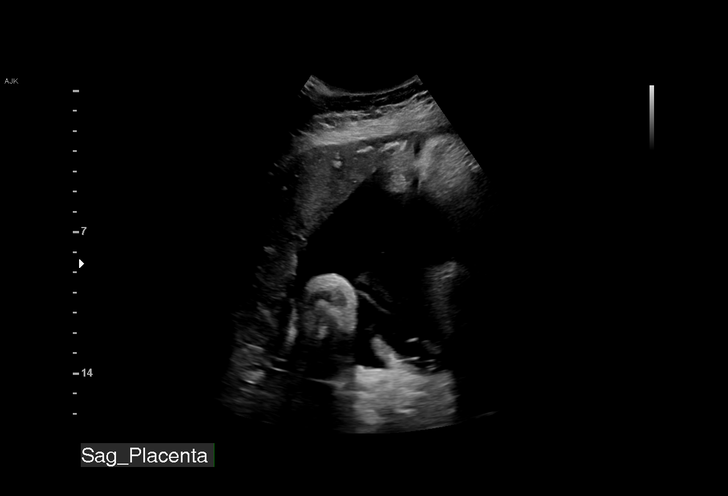
[im 7/23]
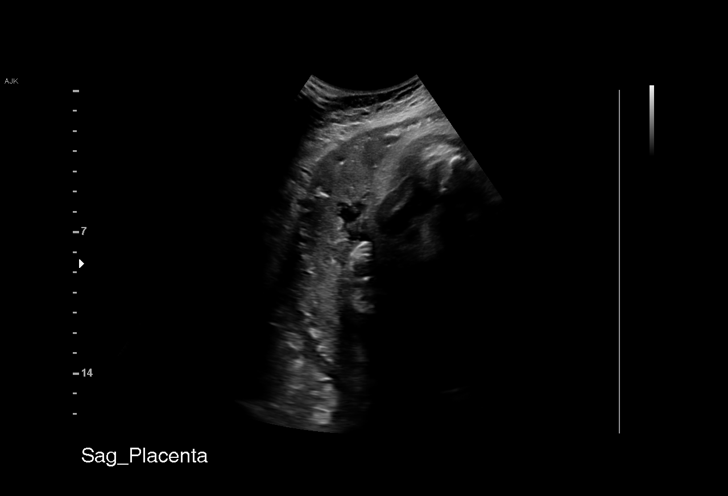
[im 9/23]
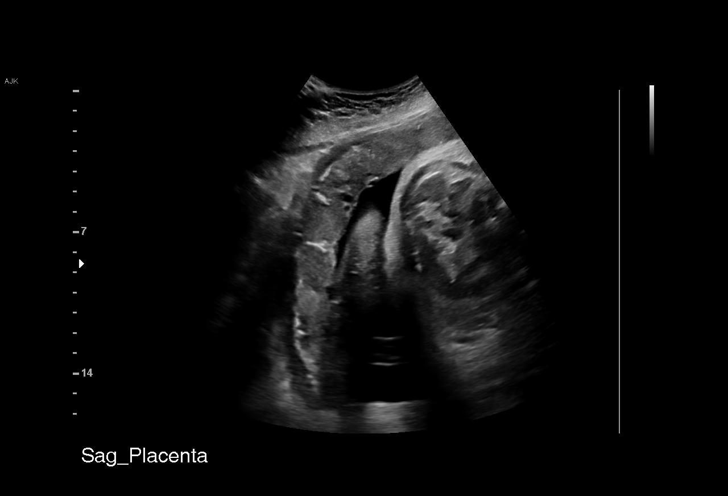
[im 10/23]
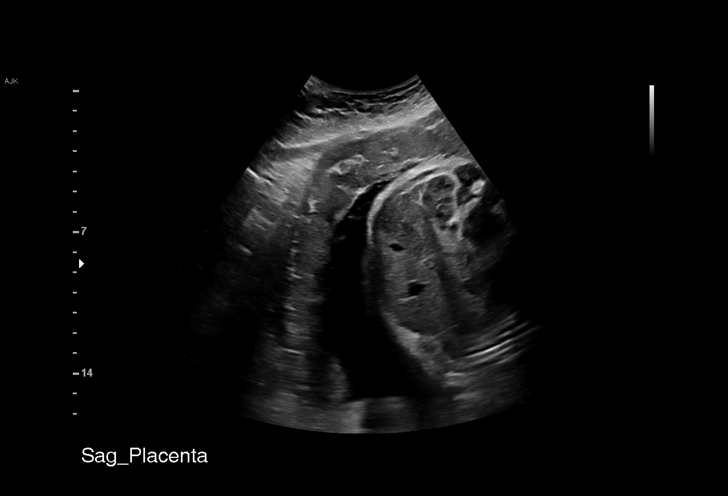
[im 12/23]
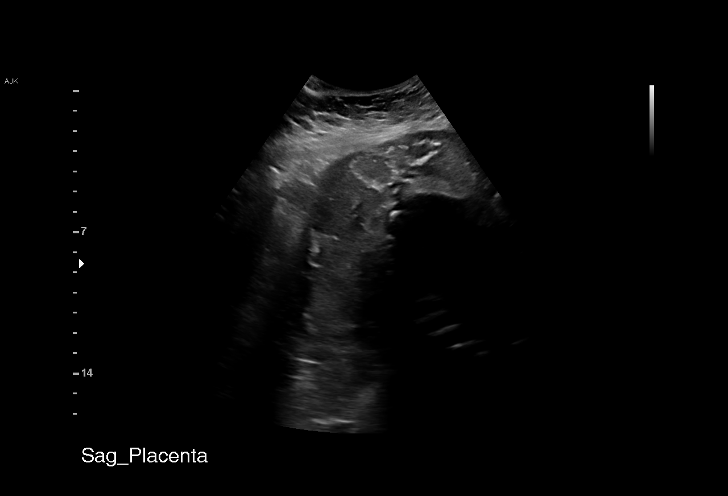
[im 14/23]
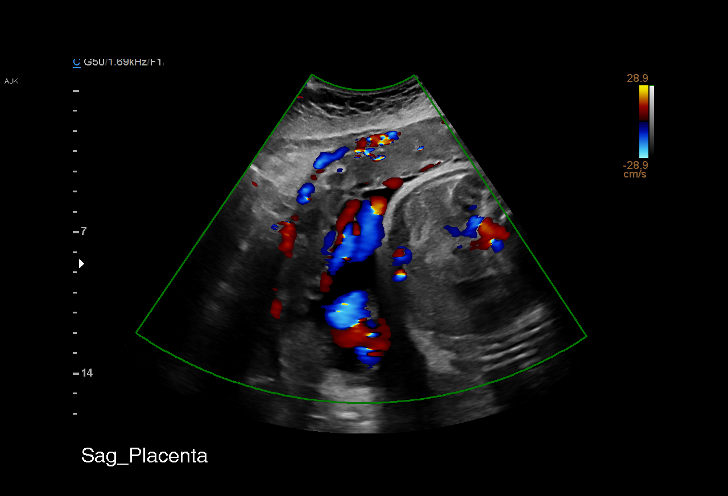
[im 15/23]
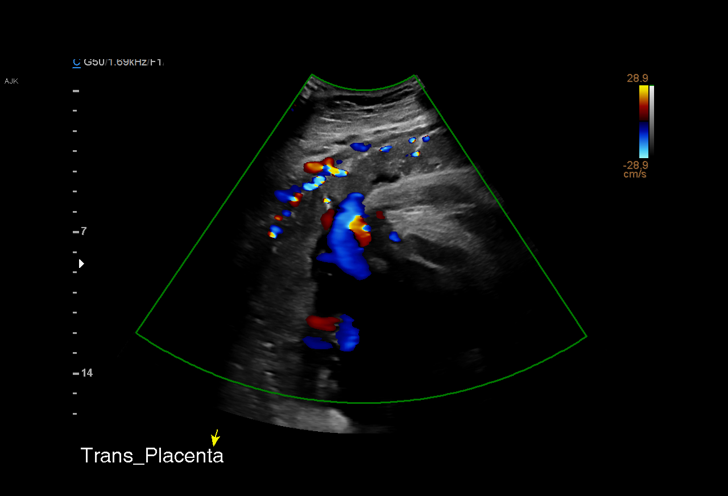
[im 17/23]
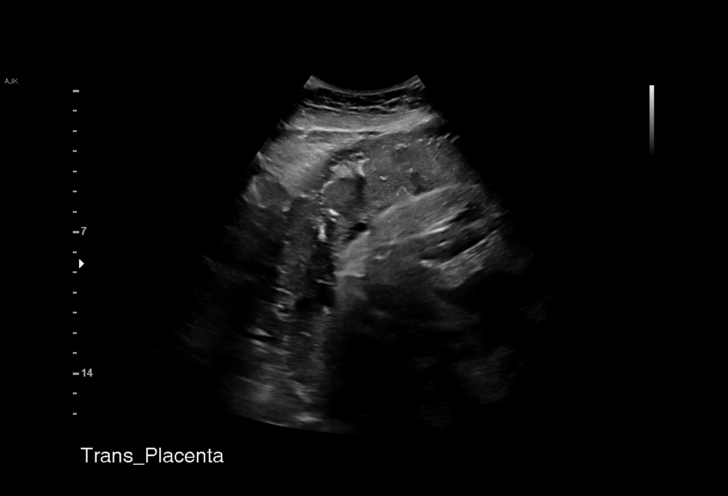
[im 18/23]
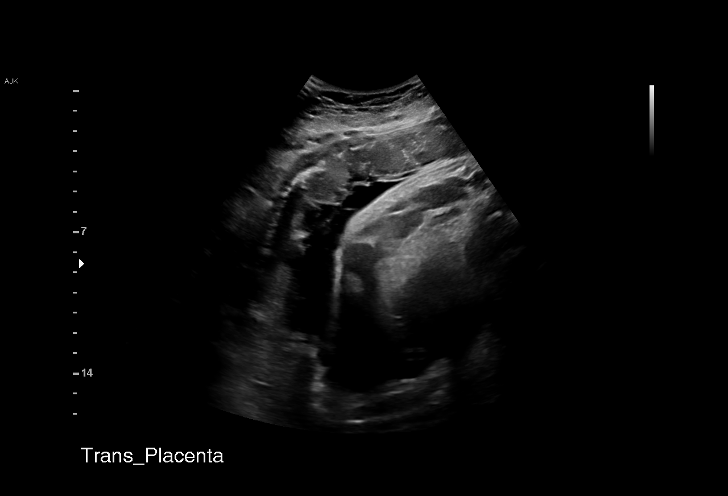
[im 20/23]
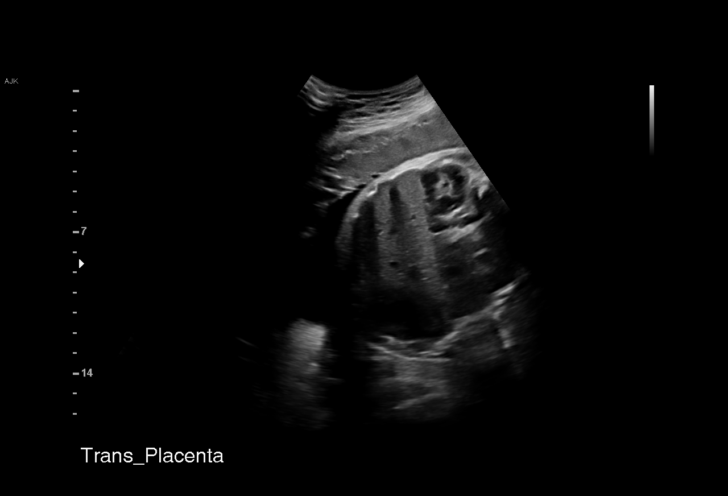
[im 21/23]
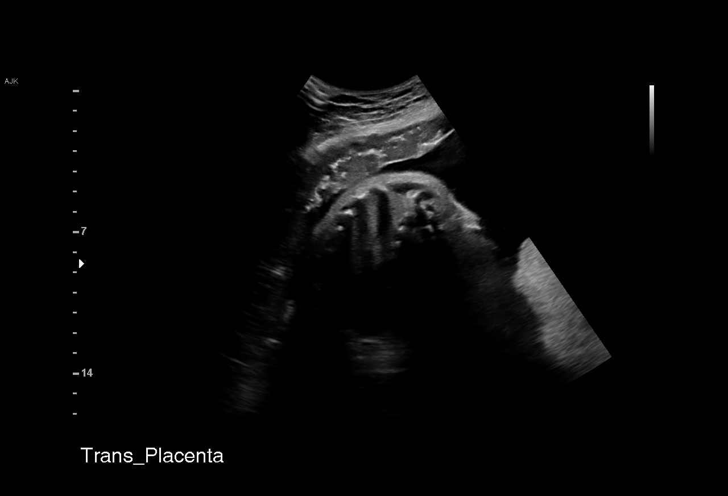
[im 23/23]
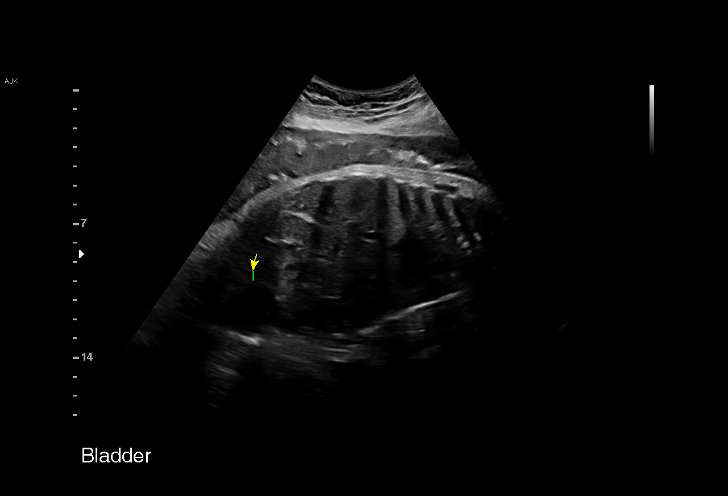

[15 of 23 positions shown; findings below may reference images not displayed]

RAISYA

[REDACTED]7
MAU/Triage

Indications

Premature rupture of membranes - leaking
fluid - pos amnisure
Gestational diabetes in pregnancy,
unspecified control
Hypertension - Gestational
35 weeks gestation of pregnancy
Vital Signs

Height:        5'3"
Fetal Evaluation

Num Of Fetuses:         1
Fetal Heart Rate(bpm):  149
Cardiac Activity:       Observed
Presentation:           Cephalic
Placenta:               Fundal
P. Cord Insertion:      Visualized

Amniotic Fluid
AFI FV:      Within normal limits

AFI Sum(cm)     %Tile       Largest Pocket(cm)
16.21           59

RUQ(cm)       RLQ(cm)       LUQ(cm)        LLQ(cm)
8.13

Comment:    Bladder noted.
OB History
Gravidity:    6         Term:   4        Prem:   1        SAB:   0
TOP:          0       Ectopic:  0        Living: 5
Gestational Age

LMP:           36w 4d        Date:  01/12/18                 EDD:   10/19/18
Best:          35w 0d     Det. By:  U/S C R L  (04/14/18)    EDD:   10/30/18
Impression

Patient was evaluated for suspected rupture of membranes.
A limited ultrasound study was performed. Amniotic fluid is
normal and good fetal activity is seen.

## 2019-08-11 IMAGING — US US MFM OB LIMITED
1 series · 15 of 28 positions shown · non-contrast
Comparison: none

[Series 1: us mfm ob limited · 15 of 32 slices shown]
[im 1/32]
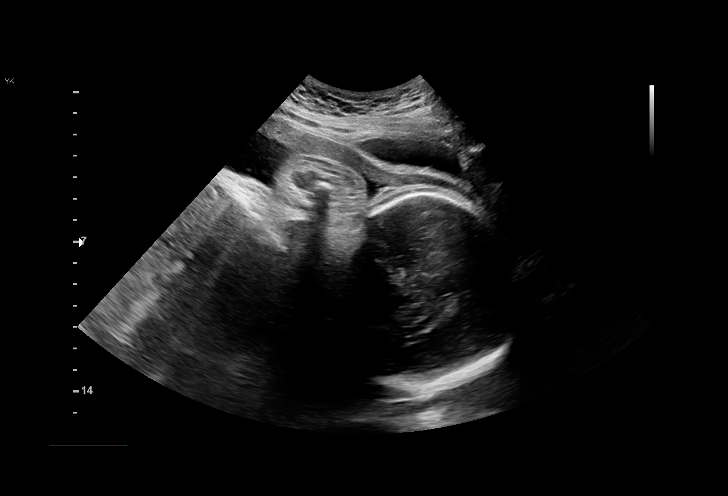
[im 3/32]
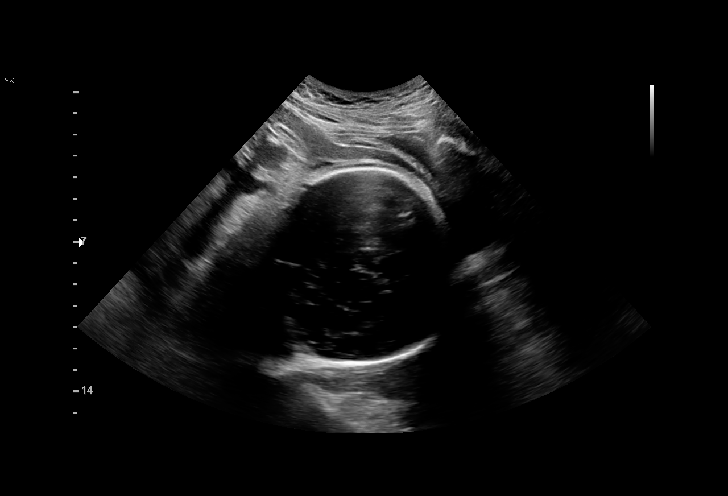
[im 5/32]
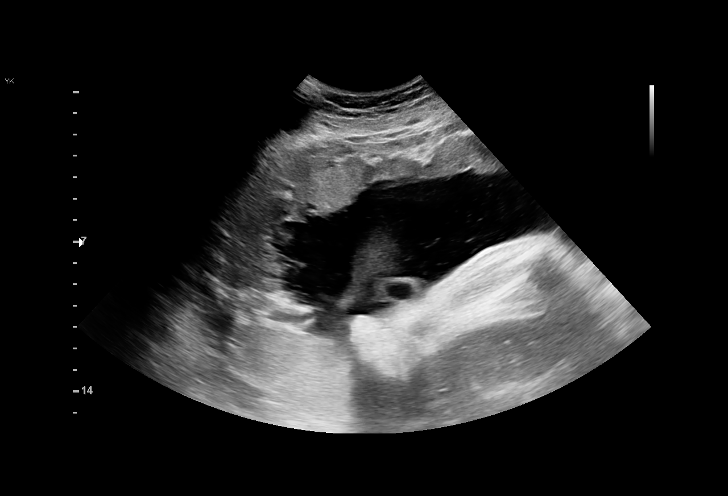
[im 7/32]
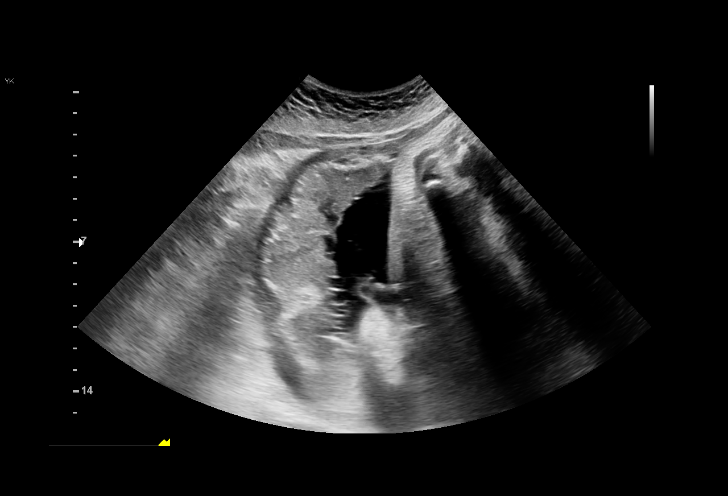
[im 10/32]
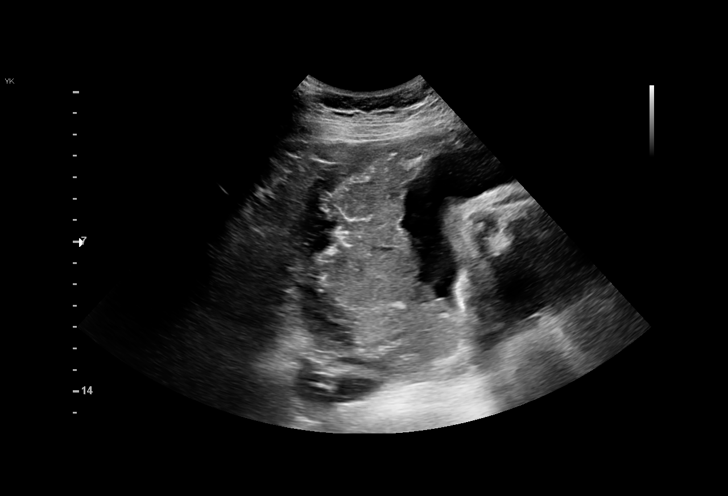
[im 12/32]
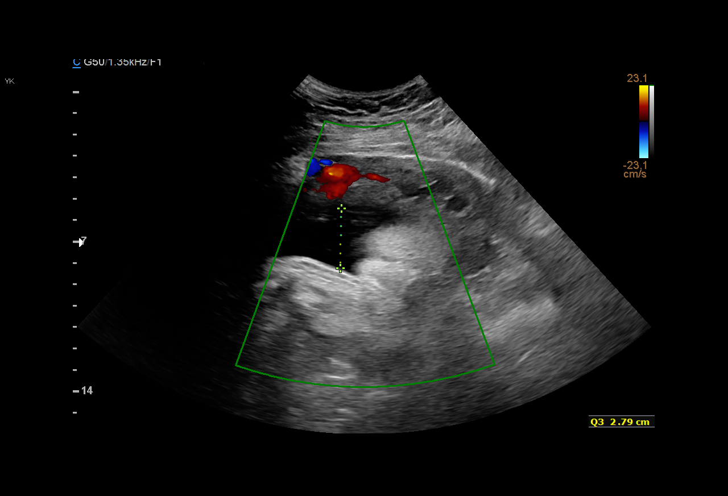
[im 14/32]
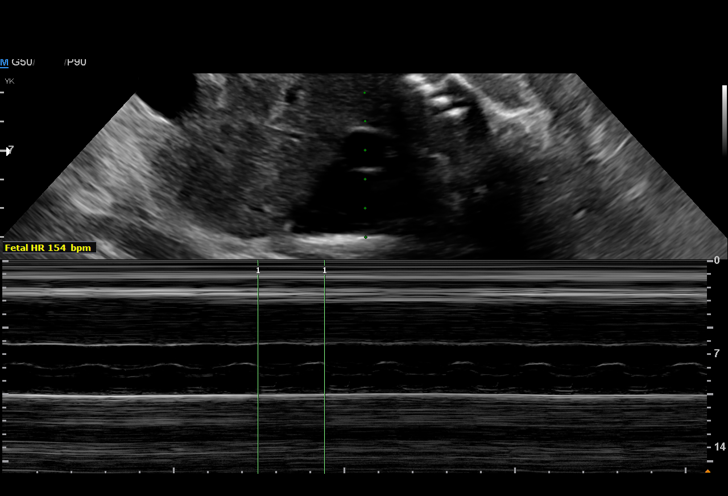
[im 17/32]
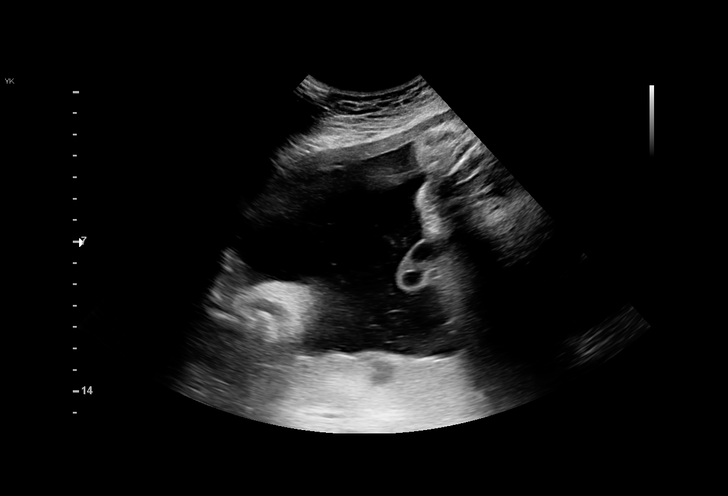
[im 18/32]
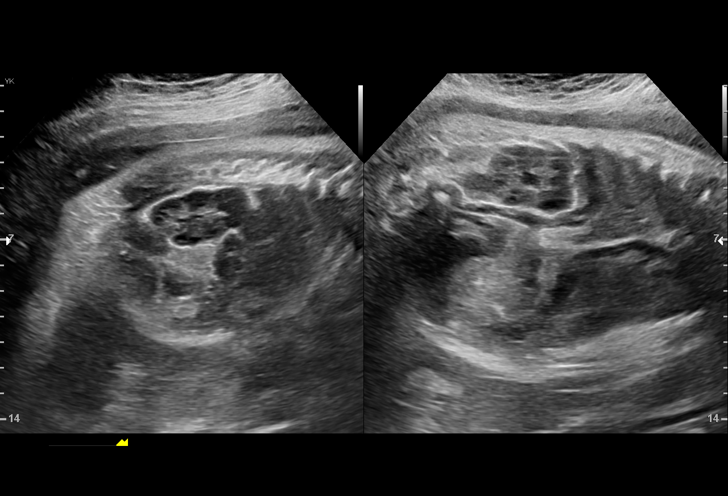
[im 20/32]
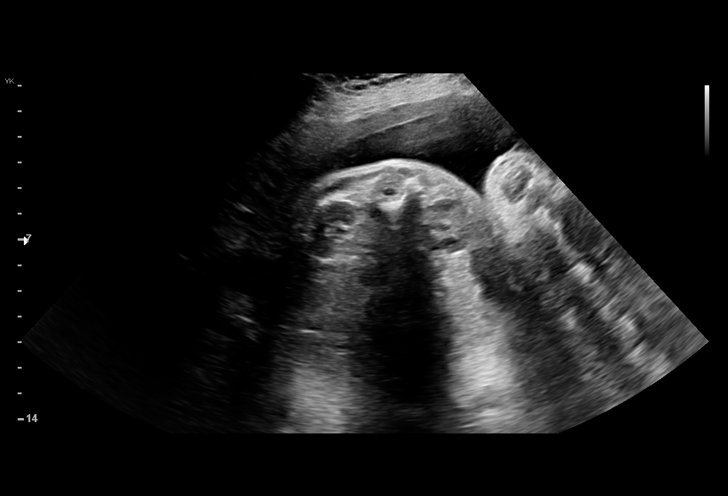
[im 22/32]
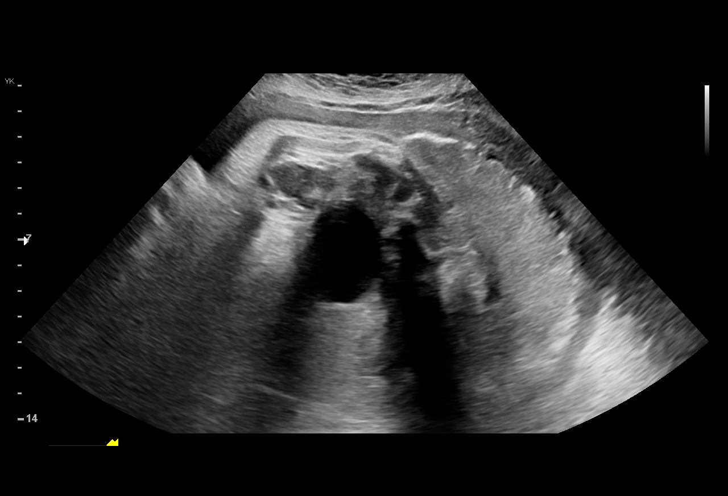
[im 25/32]
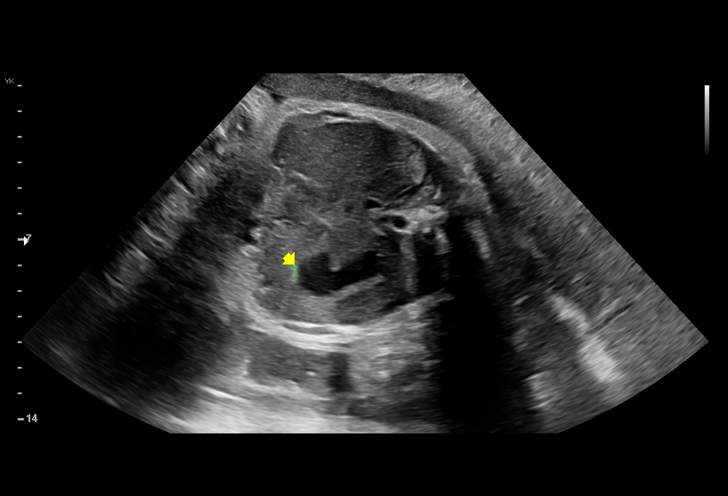
[im 27/32]
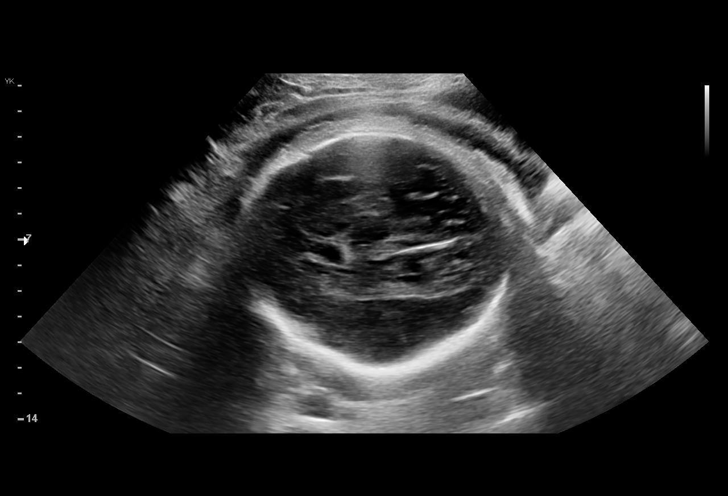
[im 29/32]
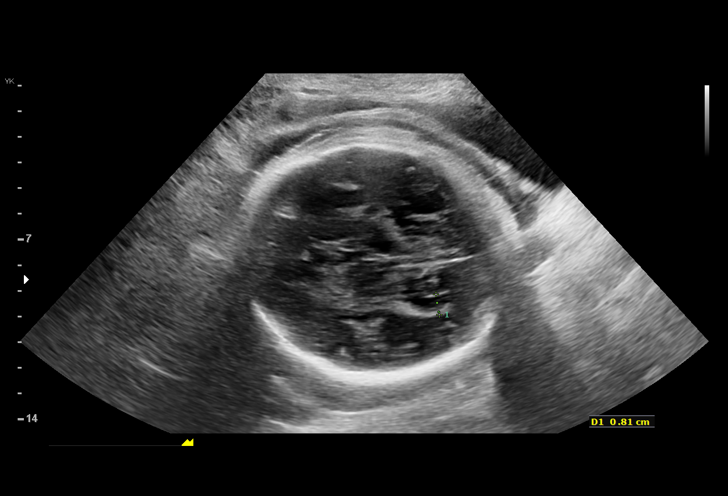
[im 32/32]
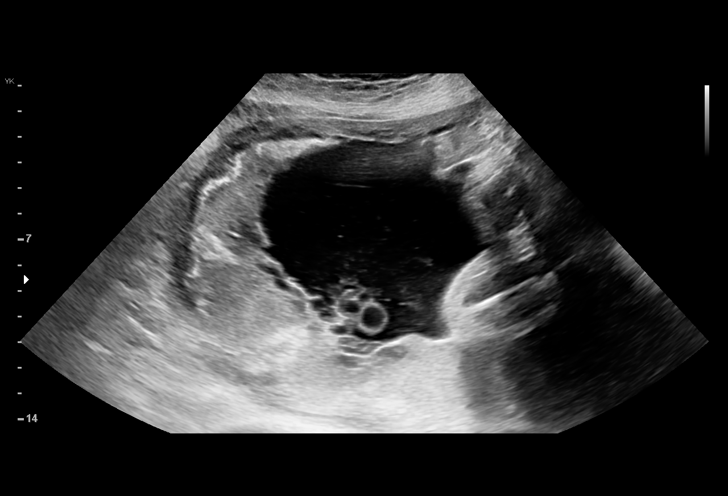

[15 of 28 positions shown; findings below may reference images not displayed]

BETUN

[REDACTED]7
MAU/Triage

Indications

Premature rupture of membranes - leaking
fluid - pos amnisure
Abdominal pain in pregnancy
Gestational diabetes in pregnancy,
unspecified control
Hypertension - Gestational
35 weeks gestation of pregnancy
Vital Signs

Height:        5'3"
Fetal Evaluation

Num Of Fetuses:          1
Fetal Heart Rate(bpm):   154
Cardiac Activity:        Observed
Presentation:            Cephalic
Placenta:                Fundal
P. Cord Insertion:       Previously Visualized

Amniotic Fluid
AFI FV:      Within normal limits

AFI Sum(cm)     %Tile       Largest Pocket(cm)
21.04           79

RUQ(cm)       RLQ(cm)       LUQ(cm)        LLQ(cm)
6.33

Comment:    Bladder visualized.
OB History

Gravidity:    6         Term:   4        Prem:   1        SAB:   0
TOP:          0       Ectopic:  0        Living: 5
Gestational Age

LMP:           36w 5d        Date:  01/12/18                 EDD:   10/19/18
Best:          35w 1d     Det. By:  U/S C R L  (04/14/18)    EDD:   10/30/18
Anatomy

Ventricles:            Appears normal         Kidneys:                Appear normal
Stomach:               Appears normal, left   Bladder:                Appears normal
sided
Impression

Patient was evaluated for preterm labor.
A limited ultrasound study was performed. Amniotic fluid is
normal and good fetal activity is seen. Cephalic presentation.

## 2019-10-25 ENCOUNTER — Other Ambulatory Visit: Payer: Self-pay | Admitting: Obstetrics and Gynecology

## 2019-10-25 DIAGNOSIS — O24410 Gestational diabetes mellitus in pregnancy, diet controlled: Secondary | ICD-10-CM

## 2019-10-25 DIAGNOSIS — O0990 Supervision of high risk pregnancy, unspecified, unspecified trimester: Secondary | ICD-10-CM

## 2019-10-25 DIAGNOSIS — I10 Essential (primary) hypertension: Secondary | ICD-10-CM

## 2019-11-17 ENCOUNTER — Telehealth: Payer: Self-pay | Admitting: *Deleted

## 2019-11-17 NOTE — Telephone Encounter (Signed)
Pt called to office stating she was still having some right sided pain, ?ovarian cyst.  Pt would like to know if she can get Rx or something for pain.   Attempt to return call, no answer. LM on Vm making pt aware she will nee to call and make appt to be seen and evaluated as she has not been seen in office in 1+year.  Advised she may take OTC Tylenol/Ibuprofen as needed.  Advised to call office and make appt.

## 2020-01-06 ENCOUNTER — Ambulatory Visit
Admission: EM | Admit: 2020-01-06 | Discharge: 2020-01-06 | Disposition: A | Discharge status: Home / Self Care | Payer: Managed Care, Other (non HMO) | Attending: Emergency Medicine | Admitting: Emergency Medicine

## 2020-01-06 ENCOUNTER — Other Ambulatory Visit: Payer: Self-pay

## 2020-01-06 DIAGNOSIS — J0140 Acute pansinusitis, unspecified: Secondary | ICD-10-CM

## 2020-01-06 DIAGNOSIS — Z20822 Contact with and (suspected) exposure to covid-19: Secondary | ICD-10-CM

## 2020-01-06 MED ORDER — CETIRIZINE HCL 10 MG PO TABS: 10.0000 mg | ORAL_TABLET | Freq: Every day | ORAL | 0 refills | Status: DC

## 2020-01-06 MED ORDER — BENZONATATE 100 MG PO CAPS: 100.0000 mg | ORAL_CAPSULE | Freq: Three times a day (TID) | ORAL | 0 refills | Status: DC

## 2020-01-06 MED ORDER — AMOXICILLIN-POT CLAVULANATE 875-125 MG PO TABS: 1.0000 | ORAL_TABLET | Freq: Two times a day (BID) | ORAL | 0 refills | Status: AC

## 2020-01-06 MED ORDER — FLUTICASONE PROPIONATE 50 MCG/ACT NA SUSP: 2.0000 | Freq: Every day | NASAL | 0 refills | Status: DC

## 2020-01-06 NOTE — Discharge Instructions (Signed)
COVID testing ordered.  It will take between 5-7 days for test results.  Someone will contact you regarding abnormal results.    In the meantime: You should remain isolated in your home for 10 days from symptom onset AND greater than 72 hours after symptoms resolution (absence of fever without the use of fever-reducing medication and improvement in respiratory symptoms), whichever is longer Get plenty of rest and push fluids Augmentin prescribed.  Take as directed and to completion Tessalon Perles prescribed for cough Use OTC zyrtec for nasal congestion, runny nose, and/or sore throat Use OTC flonase for nasal congestion and runny nose Use medications daily for symptom relief Use OTC medications like ibuprofen or tylenol as needed fever or pain Call or go to the ED if you have any new or worsening symptoms such as fever, worsening cough, shortness of breath, chest tightness, chest pain, turning blue, changes in mental status, etc..Marland Kitchen

## 2020-01-06 NOTE — ED Triage Notes (Signed)
Pt presents to UC w/ c/o right sided sore throat x2 weeks. Pt states lymph node swelling and pain come and go. Pt states yesterday pain got worse and made her head start hurting.

## 2020-01-06 NOTE — ED Provider Notes (Signed)
[facility]   [id number] 01/06/20 Arrival Time: 1254   CC: COVID symptoms  SUBJECTIVE: History from: patient.  [name] is a 39 y.o. female who presents with sore throat, dry cough, and RT sided facial pain/pressure x 2 weeks.  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  Has tried OTC medication without relief.  Denies aggravating factors.  Denies previous symptoms in the past.   Denies fever, chills, SOB, wheezing, chest pain, nausea, changes in bowel or bladder habits.    Had COVID test 2 weeks ago that was negative, prior to symptoms.   Had strep done earlier this week and was negative.    ROS: As per HPI.  All other pertinent ROS negative.     Past Medical History:  Diagnosis Date  . Diabetes mellitus without complication (HCC)    during pregnancy  . GERD (gastroesophageal reflux disease)   . Gestational hypertension   . History of cocaine use   . Kidney stones   . Ovarian cyst   . Pregnancy induced hypertension    Past Surgical History:  Procedure Laterality Date  . APPENDECTOMY    . HEMORRHOID SURGERY N/A 01/19/2016   Procedure: EXTENSIVE HEMORRHOIDECTOMY;  Surgeon: Franky Macho, MD;  Location: [facility];  Service: General;  Laterality: N/A;  . kidney stents    . LITHOTRIPSY     No Known Allergies No current facility-administered medications on file prior to encounter.   Current Outpatient Medications on File Prior to Encounter  Medication Sig Dispense Refill  . ibuprofen (ADVIL,MOTRIN) 600 MG tablet Take 1 tablet (600 mg total) by mouth every 6 (six) hours. 30 tablet 1  . [DISCONTINUED] metoCLOPramide (REGLAN) 10 MG tablet Take 1 tablet (10 mg total) by mouth every 6 (six) hours. 20 tablet 0   Social History   Socioeconomic History  . Marital status: Married    Spouse name: Not on file  . Number of children: Not on file  . Years of education: Not on file  . Highest education level: Not on file    Occupational History  . Not on file  Tobacco Use  . Smoking status: Never Smoker  . Smokeless tobacco: Never Used  Substance and Sexual Activity  . Alcohol use: No  . Drug use: No    Comment: Hx Cocaine use  . Sexual activity: Not Currently    Birth control/protection: None  Other Topics Concern  . Not on file  Social History Narrative  . Not on file   Social Determinants of Health   Financial Resource Strain:   . Difficulty of Paying Living Expenses: Not on file  Food Insecurity:   . Worried About Programme researcher, broadcasting/film/video in the Last Year: Not on file  . Ran Out of Food in the Last Year: Not on file  Transportation Needs:   . Lack of Transportation (Medical): Not on file  . Lack of Transportation (Non-Medical): Not on file  Physical Activity:   . Days of Exercise per Week: Not on file  . Minutes of Exercise per Session: Not on file  Stress:   . Feeling of Stress : Not on file  Social Connections:   . Frequency of Communication with Friends and Family: Not on file  . Frequency of Social Gatherings with Friends and Family: Not on file  . Attends Religious Services: Not on file  . Active Member of Clubs or Organizations: Not on file  . Attends Club  or Organization Meetings: Not on file  . Marital Status: Not on file  Intimate Partner Violence:   . Fear of Current or Ex-Partner: Not on file  . Emotionally Abused: Not on file  . Physically Abused: Not on file  . Sexually Abused: Not on file   Family History  Problem Relation Age of Onset  . Cancer Mother        breast  . Diabetes Maternal Grandmother     OBJECTIVE:  Vitals:   01/06/20 1306  BP: (!) 142/86  Pulse: 100  Resp: 16  Temp: 98 F (36.7 C)  TempSrc: Oral  SpO2: 96%     General appearance: alert; appears fatigued, but nontoxic; speaking in full sentences and tolerating own secretions HEENT: NCAT; Ears: EACs clear, TMs pearly gray; Eyes: PERRL.  EOM grossly intact. Sinuses: nontender; Nose: nares  patent with rhinorrhea, Sinus: TTP over RT frontal and maxillary sinuses; Throat: oropharynx clear, tonsils non erythematous or enlarged, uvula midline  Neck: supple without LAD Lungs: unlabored respirations, symmetrical air entry; cough: moderate; no respiratory distress; CTAB Heart: regular rate and rhythm.   Skin: warm and dry Psychological: alert and cooperative; normal mood and affect ASSESSMENT & PLAN:  1. Suspected COVID-19 virus infection   2. Acute non-recurrent pansinusitis     Meds ordered this encounter  Medications  . cetirizine (ZYRTEC) 10 MG tablet    Sig: Take 1 tablet (10 mg total) by mouth daily.    Dispense:  30 tablet    Refill:  0    Order Specific Question:   Supervising Provider    Answer:   Eustace Moore [[id number]]  . fluticasone (FLONASE) 50 MCG/ACT nasal spray    Sig: Place 2 sprays into both nostrils daily.    Dispense:  16 g    Refill:  0    Order Specific Question:   Supervising Provider    Answer:   Eustace Moore [[id number]]  . benzonatate (TESSALON) 100 MG capsule    Sig: Take 1 capsule (100 mg total) by mouth every 8 (eight) hours.    Dispense:  21 capsule    Refill:  0    Order Specific Question:   Supervising Provider    Answer:   Eustace Moore [[id number]]  . amoxicillin-clavulanate (AUGMENTIN) 875-125 MG tablet    Sig: Take 1 tablet by mouth every 12 (twelve) hours for 10 days.    Dispense:  20 tablet    Refill:  0    Order Specific Question:   Supervising Provider    Answer:   Eustace Moore [[id number]]   COVID testing ordered.  It will take between 5-7 days for test results.  Someone will contact you regarding abnormal results.    In the meantime: You should remain isolated in your home for 10 days from symptom onset AND greater than 72 hours after symptoms resolution (absence of fever without the use of fever-reducing medication and improvement in respiratory symptoms), whichever is longer Get plenty of rest and push  fluids Augmentin prescribed.  Take as directed and to completion Tessalon Perles prescribed for cough Use OTC zyrtec for nasal congestion, runny nose, and/or sore throat Use OTC flonase for nasal congestion and runny nose Use medications daily for symptom relief Use OTC medications like ibuprofen or tylenol as needed fever or pain Call or go to the ED if you have any new or worsening symptoms such as fever, worsening cough, shortness of breath, chest tightness, chest pain, turning  blue, changes in mental status, etc...   Reviewed expectations re: course of current medical issues. Questions answered. Outlined signs and symptoms indicating need for more acute intervention. Patient verbalized understanding. After Visit Summary given.         Lestine Box, PA-C 01/06/20 1331

## 2020-01-07 LAB — NOVEL CORONAVIRUS, NAA: SARS-CoV-2, NAA: NOT DETECTED

## 2020-05-09 ENCOUNTER — Other Ambulatory Visit: Payer: Self-pay

## 2020-05-09 ENCOUNTER — Ambulatory Visit
Admission: EM | Admit: 2020-05-09 | Discharge: 2020-05-09 | Disposition: A | Discharge status: Home / Self Care | Payer: Managed Care, Other (non HMO) | Attending: Emergency Medicine | Admitting: Emergency Medicine

## 2020-05-09 DIAGNOSIS — N390 Urinary tract infection, site not specified: Secondary | ICD-10-CM | POA: Insufficient documentation

## 2020-05-09 LAB — POCT URINALYSIS DIP (MANUAL ENTRY)
Bilirubin, UA: NEGATIVE
Glucose, UA: NEGATIVE mg/dL
Ketones, POC UA: NEGATIVE mg/dL
Nitrite, UA: NEGATIVE
Protein Ur, POC: NEGATIVE mg/dL
Spec Grav, UA: 1.015 (ref 1.010–1.025)
Urobilinogen, UA: 0.2 E.U./dL
pH, UA: 6 (ref 5.0–8.0)

## 2020-05-09 LAB — POCT URINE PREGNANCY: Preg Test, Ur: NEGATIVE

## 2020-05-09 MED ORDER — IBUPROFEN 600 MG PO TABS: 600.0000 mg | ORAL_TABLET | Freq: Four times a day (QID) | ORAL | 0 refills | Status: DC | PRN

## 2020-05-09 MED ORDER — NITROFURANTOIN MONOHYD MACRO 100 MG PO CAPS: 100.0000 mg | ORAL_CAPSULE | Freq: Two times a day (BID) | ORAL | 0 refills | Status: DC

## 2020-05-09 NOTE — Discharge Instructions (Addendum)
Urine culture sent.  We will call you with the results.   Push fluids and get plenty of rest.   Take antibiotic as directed and to completion Take ibuprofen as needed for back pain Follow up with PCP if symptoms persists Return here or go to ER if you have any new or worsening symptoms such as fever, worsening abdominal pain, nausea/vomiting, flank pain, etc..

## 2020-05-09 NOTE — ED Provider Notes (Addendum)
[facility]   CC: Abdominal pain  SUBJECTIVE:  [name] is a 39 y.o. female who lower abdominal and lower back pain for the past few days for the past few days.  Patient denies a precipitating event, recent sexual encounter, excessive caffeine intake.  Localizes the pain to the lower abdomen/ back.  Pain is intermittent and describes it as sharp.  Has tried OTC medications without relief.  Symptoms are made worse with urination.  Admits to similar symptoms in the past.  Denies fever, chills, nausea, vomiting, abdominal pain, flank pain, abnormal vaginal discharge or bleeding, hematuria.    Patient reports she is also having some headache and blurry vision.  She does work with Covid vaccine.  States she has been tested often.  Denies similar symptoms in the past.  Denies fever, chills, nausea, vomiting, diarrhea, confusion, facial droop, paresthesia.   LMP: Patient's last menstrual period was 02/28/2020 (approximate).  ROS: As in HPI.  All other pertinent ROS negative.     Past Medical History:  Diagnosis Date  . Diabetes mellitus without complication (HCC)    during pregnancy  . GERD (gastroesophageal reflux disease)   . Gestational hypertension   . History of cocaine use   . Kidney stones   . Ovarian cyst   . Pregnancy induced hypertension    Past Surgical History:  Procedure Laterality Date  . APPENDECTOMY    . HEMORRHOID SURGERY N/A 01/19/2016   Procedure: EXTENSIVE HEMORRHOIDECTOMY;  Surgeon: Franky Macho, MD;  Location: [facility];  Service: General;  Laterality: N/A;  . kidney stents    . LITHOTRIPSY     No Known Allergies No current facility-administered medications on file prior to encounter.   Current Outpatient Medications on File Prior to Encounter  Medication Sig Dispense Refill  . benzonatate (TESSALON) 100 MG capsule Take 1 capsule (100 mg total) by mouth every 8 (eight) hours. 21 capsule 0  . cetirizine (ZYRTEC) 10 MG tablet Take 1 tablet  (10 mg total) by mouth daily. 30 tablet 0  . fluticasone (FLONASE) 50 MCG/ACT nasal spray Place 2 sprays into both nostrils daily. 16 g 0  . [DISCONTINUED] metoCLOPramide (REGLAN) 10 MG tablet Take 1 tablet (10 mg total) by mouth every 6 (six) hours. 20 tablet 0   Social History   Socioeconomic History  . Marital status: Married    Spouse name: Not on file  . Number of children: Not on file  . Years of education: Not on file  . Highest education level: Not on file  Occupational History  . Not on file  Tobacco Use  . Smoking status: Never Smoker  . Smokeless tobacco: Never Used  Substance and Sexual Activity  . Alcohol use: No  . Drug use: No    Comment: Hx Cocaine use  . Sexual activity: Not Currently    Birth control/protection: None  Other Topics Concern  . Not on file  Social History Narrative  . Not on file   Social Determinants of Health   Financial Resource Strain:   . Difficulty of Paying Living Expenses:   Food Insecurity:   . Worried About Programme researcher, broadcasting/film/video in the Last Year:   . Barista in the Last Year:   Transportation Needs:   . Freight forwarder (Medical):   Marland Kitchen Lack of Transportation (Non-Medical):   Physical Activity:   . Days of Exercise per Week:   . Minutes of Exercise per Session:   Stress:   .  Feeling of Stress :   Social Connections:   . Frequency of Communication with Friends and Family:   . Frequency of Social Gatherings with Friends and Family:   . Attends Religious Services:   . Active Member of Clubs or Organizations:   . Attends Archivist Meetings:   Marland Kitchen Marital Status:   Intimate Partner Violence:   . Fear of Current or Ex-Partner:   . Emotionally Abused:   Marland Kitchen Physically Abused:   . Sexually Abused:    Family History  Problem Relation Age of Onset  . Cancer Mother        breast  . Diabetes Maternal Grandmother     OBJECTIVE:  Vitals:   05/09/20 1949  BP: 136/75  Pulse: (!) 110  Resp: 17  Temp: 98.3  F (36.8 C)  TempSrc: Oral  SpO2: 98%   General appearance: AOx3 in no acute distress HEENT: NCAT.  Oropharynx clear.  Lungs: clear to auscultation bilaterally without adventitious breath sounds Heart: regular rate and rhythm.  Radial pulses 2+ symmetrical bilaterally Abdomen: soft; non-distended; no tenderness; bowel sounds present; no guarding or rebound tenderness Back: Low back pain Extremities: no edema; symmetrical with no gross deformities Skin: warm and dry Neurologic: CN I to XII within normal limit.  Ambulates from chair to exam table without difficulty Psychological: alert and cooperative; normal mood and affect  Labs Reviewed  POCT URINALYSIS DIP (MANUAL ENTRY) - Abnormal; Notable for the following components:      Result Value   Blood, UA moderate (*)    Leukocytes, UA Trace (*)    All other components within normal limits  URINE CULTURE  POCT URINE PREGNANCY    ASSESSMENT & PLAN:  1. Lower urinary tract infection     Meds ordered this encounter  Medications  . nitrofurantoin, macrocrystal-monohydrate, (MACROBID) 100 MG capsule    Sig: Take 1 capsule (100 mg total) by mouth 2 (two) times daily.    Dispense:  10 capsule    Refill:  0  . ibuprofen (ADVIL) 600 MG tablet    Sig: Take 1 tablet (600 mg total) by mouth every 6 (six) hours as needed.    Dispense:  30 tablet    Refill:  0   Discharge instruction Urine culture sent.  We will call you with the results.   Push fluids and get plenty of rest.   Take antibiotic as directed and to completion Take ibuprofen as needed for back pain Follow up with PCP if symptoms persists Return here or go to ER if you have any new or worsening symptoms such as fever, worsening abdominal pain, nausea/vomiting, flank pain, etc...  Outlined signs and symptoms indicating need for more acute intervention. Patient verbalized understanding. After Visit Summary given.     Emerson Monte, FNP 05/09/20 2012    Emerson Monte, FNP 05/09/20 2019

## 2020-05-09 NOTE — ED Triage Notes (Signed)
Pt presents with multiple complaints. Advised that she has right lower quadrant pain , blurred vision and headache for past week

## 2020-05-12 LAB — URINE CULTURE: Culture: 60000 — AB

## 2020-07-30 ENCOUNTER — Ambulatory Visit
Admission: EM | Admit: 2020-07-30 | Discharge: 2020-07-30 | Disposition: A | Discharge status: Home / Self Care | Payer: Managed Care, Other (non HMO) | Attending: Emergency Medicine | Admitting: Emergency Medicine

## 2020-07-30 ENCOUNTER — Other Ambulatory Visit: Payer: Self-pay

## 2020-07-30 DIAGNOSIS — Z1152 Encounter for screening for COVID-19: Secondary | ICD-10-CM

## 2020-07-30 DIAGNOSIS — J069 Acute upper respiratory infection, unspecified: Secondary | ICD-10-CM

## 2020-07-30 DIAGNOSIS — Z20822 Contact with and (suspected) exposure to covid-19: Secondary | ICD-10-CM

## 2020-07-30 MED ORDER — BENZONATATE 100 MG PO CAPS: 100.0000 mg | ORAL_CAPSULE | Freq: Three times a day (TID) | ORAL | 0 refills | Status: DC

## 2020-07-30 NOTE — ED Triage Notes (Signed)
Pt has had cough and sore throat and also has had body aches and nausea  After positive covid exposure, has had vaccine

## 2020-07-30 NOTE — Discharge Instructions (Signed)

## 2020-07-30 NOTE — ED Provider Notes (Signed)
[facility]   [id number] 07/30/20 Arrival Time: 1513   CC: COVID symptoms  SUBJECTIVE: History from: patient.  Elanie L Rayson is a 39 y.o. female who presents with congestion, sore throat, cough, body aches, and chills x 3 days ago.  Reports positive COVID exposure.  Has had COVID vaccines.  Has tried OTC medications without relief.  Denies aggravating factors.  Denies previous COVID infection in the past.   Denies fever, chills, fatigue, sinus pain, rhinorrhea, sore throat, SOB, wheezing, chest pain, nausea, changes in bowel or bladder habits.    ROS: As per HPI.  All other pertinent ROS negative.     Past Medical History:  Diagnosis Date  . Diabetes mellitus without complication (HCC)    during pregnancy  . GERD (gastroesophageal reflux disease)   . Gestational hypertension   . History of cocaine use   . Kidney stones   . Ovarian cyst   . Pregnancy induced hypertension    Past Surgical History:  Procedure Laterality Date  . APPENDECTOMY    . HEMORRHOID SURGERY N/A 01/19/2016   Procedure: EXTENSIVE HEMORRHOIDECTOMY;  Surgeon: Franky Macho, MD;  Location: [facility];  Service: General;  Laterality: N/A;  . kidney stents    . LITHOTRIPSY     No Known Allergies No current facility-administered medications on file prior to encounter.   Current Outpatient Medications on File Prior to Encounter  Medication Sig Dispense Refill  . cetirizine (ZYRTEC) 10 MG tablet Take 1 tablet (10 mg total) by mouth daily. 30 tablet 0  . ibuprofen (ADVIL) 600 MG tablet Take 1 tablet (600 mg total) by mouth every 6 (six) hours as needed. 30 tablet 0  . [DISCONTINUED] fluticasone (FLONASE) 50 MCG/ACT nasal spray Place 2 sprays into both nostrils daily. 16 g 0  . [DISCONTINUED] metoCLOPramide (REGLAN) 10 MG tablet Take 1 tablet (10 mg total) by mouth every 6 (six) hours. 20 tablet 0   Social History   Socioeconomic History  . Marital status: Married    Spouse name: Not on file  .  Number of children: Not on file  . Years of education: Not on file  . Highest education level: Not on file  Occupational History  . Not on file  Tobacco Use  . Smoking status: Never Smoker  . Smokeless tobacco: Never Used  Vaping Use  . Vaping Use: Never used  Substance and Sexual Activity  . Alcohol use: No  . Drug use: No    Comment: Hx Cocaine use  . Sexual activity: Not Currently    Birth control/protection: None  Other Topics Concern  . Not on file  Social History Narrative  . Not on file   Social Determinants of Health   Financial Resource Strain:   . Difficulty of Paying Living Expenses:   Food Insecurity:   . Worried About Programme researcher, broadcasting/film/video in the Last Year:   . Barista in the Last Year:   Transportation Needs:   . Freight forwarder (Medical):   Marland Kitchen Lack of Transportation (Non-Medical):   Physical Activity:   . Days of Exercise per Week:   . Minutes of Exercise per Session:   Stress:   . Feeling of Stress :   Social Connections:   . Frequency of Communication with Friends and Family:   . Frequency of Social Gatherings with Friends and Family:   . Attends Religious Services:   . Active Member of Clubs or Organizations:   . Attends Club  or Organization Meetings:   Marland Kitchen Marital Status:   Intimate Partner Violence:   . Fear of Current or Ex-Partner:   . Emotionally Abused:   Marland Kitchen Physically Abused:   . Sexually Abused:    Family History  Problem Relation Age of Onset  . Cancer Mother        breast  . Diabetes Maternal Grandmother     OBJECTIVE:  Vitals:   07/30/20 1525  BP: 136/85  Pulse: (!) 112  Resp: 18  Temp: 98.6 F (37 C)  SpO2: 98%    General appearance: alert; appears fatigued, but nontoxic; speaking in full sentences and tolerating own secretions HEENT: NCAT; Ears: EACs clear, TMs pearly gray; Eyes: PERRL.  EOM grossly intact. Nose: nares patent without rhinorrhea, Throat: oropharynx clear, tonsils non erythematous or enlarged,  uvula midline  Neck: supple without LAD Lungs: unlabored respirations, symmetrical air entry; cough: absent; no respiratory distress; CTAB Heart: regular rate and rhythm.  Skin: warm and dry Psychological: alert and cooperative; normal mood and affect  ASSESSMENT & PLAN:  1. Encounter for screening for COVID-19   2. Viral URI with cough   3. Exposure to COVID-19 virus     Meds ordered this encounter  Medications  . DISCONTD: benzonatate (TESSALON) 100 MG capsule    Sig: Take 1 capsule (100 mg total) by mouth every 8 (eight) hours.    Dispense:  21 capsule    Refill:  0    Order Specific Question:   Supervising Provider    Answer:   Eustace Moore [[id number]]  . benzonatate (TESSALON) 100 MG capsule    Sig: Take 1 capsule (100 mg total) by mouth every 8 (eight) hours.    Dispense:  21 capsule    Refill:  0    Order Specific Question:   Supervising Provider    Answer:   Eustace Moore [[id number]]   COVID testing ordered.  It will take between 2-5 days for test results.  Someone will contact you regarding abnormal results.    In the meantime: You should remain isolated in your home for 10 days from symptom onset AND greater than 72 hours after symptoms resolution (absence of fever without the use of fever-reducing medication and improvement in respiratory symptoms), whichever is longer Get plenty of rest and push fluids Tessalon Perles prescribed for cough Use OTC zyrtec for nasal congestion, runny nose, and/or sore throat Use OTC flonase for nasal congestion and runny nose Use medications daily for symptom relief Use OTC medications like ibuprofen or tylenol as needed fever or pain Call or go to the ED if you have any new or worsening symptoms such as fever, worsening cough, shortness of breath, chest tightness, chest pain, turning blue, changes in mental status, etc...   Reviewed expectations re: course of current medical issues. Questions answered. Outlined signs and  symptoms indicating need for more acute intervention. Patient verbalized understanding. After Visit Summary given.         Rennis Harding, PA-C 07/30/20 1537

## 2020-07-31 LAB — SARS-COV-2, NAA 2 DAY TAT

## 2020-07-31 LAB — NOVEL CORONAVIRUS, NAA: SARS-CoV-2, NAA: NOT DETECTED

## 2020-09-12 ENCOUNTER — Ambulatory Visit: Payer: Self-pay

## 2020-09-12 ENCOUNTER — Other Ambulatory Visit: Payer: Self-pay

## 2020-09-12 ENCOUNTER — Ambulatory Visit (INDEPENDENT_AMBULATORY_CARE_PROVIDER_SITE_OTHER): Payer: Self-pay

## 2020-09-12 ENCOUNTER — Ambulatory Visit (HOSPITAL_COMMUNITY): Payer: Self-pay

## 2020-09-12 ENCOUNTER — Encounter: Payer: Self-pay | Admitting: Emergency Medicine

## 2020-09-12 ENCOUNTER — Ambulatory Visit
Admission: EM | Admit: 2020-09-12 | Discharge: 2020-09-12 | Disposition: A | Discharge status: Home / Self Care | Payer: Self-pay | Attending: Family Medicine | Admitting: Family Medicine

## 2020-09-12 DIAGNOSIS — R05 Cough: Secondary | ICD-10-CM

## 2020-09-12 DIAGNOSIS — B349 Viral infection, unspecified: Secondary | ICD-10-CM

## 2020-09-12 DIAGNOSIS — Z1152 Encounter for screening for COVID-19: Secondary | ICD-10-CM

## 2020-09-12 DIAGNOSIS — R0781 Pleurodynia: Secondary | ICD-10-CM

## 2020-09-12 DIAGNOSIS — R059 Cough, unspecified: Secondary | ICD-10-CM

## 2020-09-12 DIAGNOSIS — R5383 Other fatigue: Secondary | ICD-10-CM

## 2020-09-12 DIAGNOSIS — R0981 Nasal congestion: Secondary | ICD-10-CM

## 2020-09-12 DIAGNOSIS — R112 Nausea with vomiting, unspecified: Secondary | ICD-10-CM

## 2020-09-12 MED ORDER — BENZONATATE 100 MG PO CAPS: 100.0000 mg | ORAL_CAPSULE | Freq: Three times a day (TID) | ORAL | 0 refills | Status: DC

## 2020-09-12 MED ORDER — METHYLPREDNISOLONE SODIUM SUCC 125 MG IJ SOLR
125.0000 mg | Freq: Once | INTRAMUSCULAR | Status: AC
  Administered 2020-09-12: 125 mg via INTRAMUSCULAR

## 2020-09-12 MED ORDER — ONDANSETRON HCL 4 MG PO TABS: 4.0000 mg | ORAL_TABLET | Freq: Four times a day (QID) | ORAL | 0 refills | Status: DC

## 2020-09-12 MED ORDER — AZITHROMYCIN 250 MG PO TABS: 250.0000 mg | ORAL_TABLET | Freq: Every day | ORAL | 0 refills | Status: DC

## 2020-09-12 MED ORDER — AEROCHAMBER PLUS FLO-VU MEDIUM MISC
1.0000 | Freq: Once | Status: AC
  Administered 2020-09-12: 1

## 2020-09-12 MED ORDER — ONDANSETRON 4 MG PO TBDP
4.0000 mg | ORAL_TABLET | Freq: Once | ORAL | Status: AC
  Administered 2020-09-12: 4 mg via ORAL

## 2020-09-12 MED ORDER — ALBUTEROL SULFATE HFA 108 (90 BASE) MCG/ACT IN AERS
2.0000 | INHALATION_SPRAY | Freq: Once | RESPIRATORY_TRACT | Status: AC
  Administered 2020-09-12: 2 via RESPIRATORY_TRACT

## 2020-09-12 MED ORDER — PREDNISONE 10 MG (21) PO TBPK: ORAL_TABLET | Freq: Every day | ORAL | 0 refills | Status: DC

## 2020-09-12 MED ORDER — IBUPROFEN 800 MG PO TABS: 800.0000 mg | ORAL_TABLET | Freq: Three times a day (TID) | ORAL | 0 refills | Status: DC | PRN

## 2020-09-12 NOTE — Discharge Instructions (Signed)
Your COVID test is pending.  You should self quarantine until the test result is back.    Take Tylenol as needed for fever or discomfort.  Rest and keep yourself hydrated.    Go to the emergency department if you develop acute worsening symptoms.     

## 2020-09-12 NOTE — ED Triage Notes (Signed)
Cough, vomiting,  shortness of breath and mid back pain since Sunday.

## 2020-09-12 NOTE — ED Provider Notes (Signed)
[facility]   [id number] 09/12/20 Arrival Time: 1751   CC: COVID symptoms  SUBJECTIVE: History from: patient.  [name] is a 39 y.o. female who presents with abrupt onset of nasal congestion, PND, vomiting, shortness of breath and left thoracic back pain and persistent dry cough for 3 days. Denies sick exposure to COVID, flu or strep. Denies recent travel. Has negative history of Covid. Has completed Covid vaccines. Has not taken OTC medications for this.  Symptoms are aggravated by activity.  There are no  alleviating factors. Denies previous symptoms in the past. Denies sinus pain, rhinorrhea, sore throat, wheezing, nausea, changes in bowel or bladder habits.    ROS: As per HPI.  All other pertinent ROS negative.     Past Medical History:  Diagnosis Date  . Diabetes mellitus without complication (HCC)    during pregnancy  . GERD (gastroesophageal reflux disease)   . Gestational hypertension   . History of cocaine use   . Kidney stones   . Ovarian cyst   . Pregnancy induced hypertension    Past Surgical History:  Procedure Laterality Date  . APPENDECTOMY    . HEMORRHOID SURGERY N/A 01/19/2016   Procedure: EXTENSIVE HEMORRHOIDECTOMY;  Surgeon: Franky Macho, MD;  Location: [facility];  Service: General;  Laterality: N/A;  . kidney stents    . LITHOTRIPSY     No Known Allergies No current facility-administered medications on file prior to encounter.   Current Outpatient Medications on File Prior to Encounter  Medication Sig Dispense Refill  . cetirizine (ZYRTEC) 10 MG tablet Take 1 tablet (10 mg total) by mouth daily. 30 tablet 0  . [DISCONTINUED] fluticasone (FLONASE) 50 MCG/ACT nasal spray Place 2 sprays into both nostrils daily. 16 g 0  . [DISCONTINUED] metoCLOPramide (REGLAN) 10 MG tablet Take 1 tablet (10 mg total) by mouth every 6 (six) hours. 20 tablet 0   Social History   Socioeconomic History  . Marital status: Married    Spouse name: Not on  file  . Number of children: Not on file  . Years of education: Not on file  . Highest education level: Not on file  Occupational History  . Not on file  Tobacco Use  . Smoking status: Never Smoker  . Smokeless tobacco: Never Used  Vaping Use  . Vaping Use: Never used  Substance and Sexual Activity  . Alcohol use: No  . Drug use: No    Comment: Hx Cocaine use  . Sexual activity: Not Currently    Birth control/protection: None  Other Topics Concern  . Not on file  Social History Narrative  . Not on file   Social Determinants of Health   Financial Resource Strain:   . Difficulty of Paying Living Expenses: Not on file  Food Insecurity:   . Worried About Programme researcher, broadcasting/film/video in the Last Year: Not on file  . Ran Out of Food in the Last Year: Not on file  Transportation Needs:   . Lack of Transportation (Medical): Not on file  . Lack of Transportation (Non-Medical): Not on file  Physical Activity:   . Days of Exercise per Week: Not on file  . Minutes of Exercise per Session: Not on file  Stress:   . Feeling of Stress : Not on file  Social Connections:   . Frequency of Communication with Friends and Family: Not on file  . Frequency of Social Gatherings with Friends and Family: Not on file  . Attends Religious  Services: Not on file  . Active Member of Clubs or Organizations: Not on file  . Attends Banker Meetings: Not on file  . Marital Status: Not on file  Intimate Partner Violence:   . Fear of Current or Ex-Partner: Not on file  . Emotionally Abused: Not on file  . Physically Abused: Not on file  . Sexually Abused: Not on file   Family History  Problem Relation Age of Onset  . Cancer Mother        breast  . Diabetes Maternal Grandmother     OBJECTIVE:  Vitals:   09/12/20 1807 09/12/20 1809 09/12/20 1854  BP: 140/73    Pulse: (!) 121  (!) 113  Resp: (!) 25    Temp: 99.1 F (37.3 C)    TempSrc: Oral    SpO2: 98%    Weight:  156 lb (70.8 kg)     Height:  5\' 3"  (1.6 m)      General appearance: alert; appears fatigued, but nontoxic; speaking in full sentences and tolerating own secretions HEENT: NCAT; Ears: EACs clear, TMs pearly gray; Eyes: PERRL.  EOM grossly intact. Sinuses: nontender; Nose: nares patent with clear rhinorrhea, Throat: oropharynx erythematous with cobblestoning, tonsils non erythematous or enlarged, uvula midline  Neck: supple with LAD Lungs: Tachypneic with unlabored respirations, symmetrical air entry; cough: moderate; no respiratory distress; diffuse wheezing throughout bilateral lung fields, diminished lung sounds in bilateral bases Heart: Tachycardic with regular rhythm.  Radial pulses 2+ symmetrical bilaterally Skin: warm and dry Psychological: alert and cooperative; normal mood and affect  LABS:  No results found for this or any previous visit (from the past 24 hour(s)).   ASSESSMENT & PLAN:  1. Viral illness   2. Cough   3. Encounter for screening for COVID-19   4. Other fatigue   5. Rib pain   6. Nasal congestion   7. Nausea and vomiting, intractability of vomiting not specified, unspecified vomiting type     Meds ordered this encounter  Medications  . albuterol (VENTOLIN HFA) 108 (90 Base) MCG/ACT inhaler 2 puff  . AeroChamber Plus Flo-Vu Medium MISC 1 each  . methylPREDNISolone sodium succinate (SOLU-MEDROL) 125 mg/2 mL injection 125 mg  . ondansetron (ZOFRAN-ODT) disintegrating tablet 4 mg  . predniSONE (STERAPRED UNI-PAK 21 TAB) 10 MG (21) TBPK tablet    Sig: Take by mouth daily. Take 6 tabs by mouth daily  for 2 days, then 5 tabs for 2 days, then 4 tabs for 2 days, then 3 tabs for 2 days, 2 tabs for 2 days, then 1 tab by mouth daily for 2 days    Dispense:  42 tablet    Refill:  0    Order Specific Question:   Supervising Provider    Answer:   Merrilee Jansky  . azithromycin (ZITHROMAX) 250 MG tablet    Sig: Take 1 tablet (250 mg total) by mouth daily. Take first 2 tablets  together, then 1 every day until finished.    Dispense:  6 tablet    Refill:  0    Order Specific Question:   Supervising Provider    Answer:   [id number] Merrilee Jansky  . benzonatate (TESSALON) 100 MG capsule    Sig: Take 1 capsule (100 mg total) by mouth every 8 (eight) hours.    Dispense:  21 capsule    Refill:  0    Order Specific Question:   Supervising Provider    Answer:  LAMPTEY, PHILIP O [id number]  . ondansetron (ZOFRAN) 4 MG tablet    Sig: Take 1 tablet (4 mg total) by mouth every 6 (six) hours.    Dispense:  12 tablet    Refill:  0    Order Specific Question:   Supervising Provider    Answer:   Merrilee Jansky [id number]  . ibuprofen (ADVIL) 800 MG tablet    Sig: Take 1 tablet (800 mg total) by mouth every 8 (eight) hours as needed for moderate pain.    Dispense:  21 tablet    Refill:  0    Order Specific Question:   Supervising Provider    Answer:   Merrilee Jansky [id number]    Chest x-ray was negative today Albuterol 2 puffs with spacer in office today Solu-Medrol 125 mg IM in office today Zofran 4 mg sublingual in office today   Prescribed azithromycin Prescribed Tessalon Perles Prescribed ibuprofen Prescribe Zofran Prescribed steroid taper  COVID testing ordered.  It will take between 1-2 days for test results.  Someone will contact you regarding abnormal results.    Patient should remain in quarantine until they have received Covid results.  If negative you may resume normal activities (go back to work/school) while practicing hand hygiene, social distance, and mask wearing.  If positive, patient should remain in quarantine for 10 days from symptom onset AND greater than 72 hours after symptoms resolution (absence of fever without the use of fever-reducing medication and improvement in respiratory symptoms), whichever is longer Get plenty of rest and push fluids Use OTC zyrtec for nasal congestion, runny nose, and/or sore throat Use OTC flonase for  nasal congestion and runny nose Use medications daily for symptom relief Use OTC medications like ibuprofen or tylenol as needed fever or pain Call or go to the ED if you have any new or worsening symptoms such as fever, worsening cough, shortness of breath, chest tightness, chest pain, turning blue, changes in mental status.  Reviewed expectations re: course of current medical issues. Questions answered. Outlined signs and symptoms indicating need for more acute intervention. Patient verbalized understanding. After Visit Summary given.         Moshe Cipro, NP 09/13/20 0830

## 2020-09-14 LAB — SARS-COV-2, NAA 2 DAY TAT

## 2020-09-14 LAB — NOVEL CORONAVIRUS, NAA: SARS-CoV-2, NAA: NOT DETECTED

## 2020-10-26 ENCOUNTER — Ambulatory Visit: Payer: Self-pay

## 2021-03-05 ENCOUNTER — Encounter: Payer: Self-pay | Admitting: Nurse Practitioner

## 2021-03-05 ENCOUNTER — Ambulatory Visit (HOSPITAL_COMMUNITY)
Admission: RE | Admit: 2021-03-05 | Discharge: 2021-03-05 | Disposition: A | Discharge status: Home / Self Care | Payer: Self-pay | Source: Ambulatory Visit | Attending: Nurse Practitioner | Admitting: Nurse Practitioner

## 2021-03-05 ENCOUNTER — Ambulatory Visit (INDEPENDENT_AMBULATORY_CARE_PROVIDER_SITE_OTHER): Payer: Self-pay | Admitting: Nurse Practitioner

## 2021-03-05 ENCOUNTER — Other Ambulatory Visit: Payer: Self-pay

## 2021-03-05 VITALS — BP 148/96 | HR 89 | Temp 98.1°F | Resp 20 | Ht 63.0 in | Wt 149.0 lb

## 2021-03-05 DIAGNOSIS — E785 Hyperlipidemia, unspecified: Secondary | ICD-10-CM

## 2021-03-05 DIAGNOSIS — R030 Elevated blood-pressure reading, without diagnosis of hypertension: Secondary | ICD-10-CM

## 2021-03-05 DIAGNOSIS — Z0000 Encounter for general adult medical examination without abnormal findings: Secondary | ICD-10-CM | POA: Insufficient documentation

## 2021-03-05 DIAGNOSIS — R7303 Prediabetes: Secondary | ICD-10-CM

## 2021-03-05 DIAGNOSIS — Z7689 Persons encountering health services in other specified circumstances: Secondary | ICD-10-CM | POA: Insufficient documentation

## 2021-03-05 DIAGNOSIS — F1411 Cocaine abuse, in remission: Secondary | ICD-10-CM

## 2021-03-05 DIAGNOSIS — Z01818 Encounter for other preprocedural examination: Secondary | ICD-10-CM

## 2021-03-05 NOTE — Progress Notes (Signed)
Please forward this to her plastic surgeon on the paperwork she gave [place].

## 2021-03-05 NOTE — Addendum Note (Signed)
Addended by: Bjorn Pippin on: 03/05/2021 08:36 AM   Modules accepted: Orders

## 2021-03-05 NOTE — Assessment & Plan Note (Signed)
BP Readings from Last 2 Encounters:  03/05/21 (!) 148/96  09/12/20 140/73   -no previous diagnosis of HTN without being pregnant -will check again in 1 week at time of physical

## 2021-03-05 NOTE — Progress Notes (Addendum)
New Patient Office Visit  Subjective:  Patient ID: [name], female    DOB: [birth year]  Age: 40 y.o. MRN: [id number]  CC:  Chief Complaint  Patient presents with  . New Patient (Initial Visit)    HPI [name] presents for new patient visit. No recent PCP. He was seeing [facility]. No recent physical or labs. Last child was born in 2019, and that was when she had last labs/physical.  He has a liposuction procedure scheduled ofr 03/22/21, and she needs surgical clearance.  Past Medical History:  Diagnosis Date  . Acute left-sided weakness 07/30/2016  . Advanced maternal age in multigravida 04/07/2018   Low risks NIPS  . Anemia 09/11/2015  . Diabetes mellitus without complication (HCC)    gestational diabetes during pregnancy  . GERD (gastroesophageal reflux disease)   . Gestational diabetes mellitus, class A1 12/20/2015   Nutrition referral made 12/20/2015    . Gestational hypertension   . Gestational hypertension, third trimester 09/21/2018        Guidelines for Antenatal Testing and Sonography  (with updated ICD-10 codes)  Updated  2017/10/03 INDICATION U/S NST/AFI or BPP wkly DELIVERY Pre-eclampsia  GHTN - O13.9/Preeclampsia without severe features  - O14.00   Preeclampsia with severe features - O14.10  Q  3-4wks  Q 2 wks  28//BPP wkly then 32//2 x wk or BPP wkly  Inpatient  37  PRN or 34   . History of cocaine use   . History of preeclampsia, prior pregnancy, currently pregnant, third trimester 01/09/2014   Valu.Nieves ] Aspirin 81 mg daily after 12 weeks; discontinue after 36 weeks Current antihypertensives:  None   Baseline and surveillance labs (pulled in from [facility], refresh links as needed)  Lab Results Component Value Date  PLT 312 04/07/2018  CREATININE 0.69 08/01/2016  AST 24 07/30/2016  ALT 22 07/30/2016  PROTCRRATIO 0.39 (H) 01/10/2016  LABPROT 12.6 07/30/2016   Antenatal Testing CHTN - O10.919  Group  . History of preterm delivery, currently  pregnant in second trimester 06/30/2018   On 17 P  . Kidney stones   . Ovarian cyst   . Pregnancy induced hypertension   . Preterm labor in third trimester without delivery 09/26/2018  . Severe hypertension affecting pregnancy, antepartum, third trimester 10/03/2018  . Supervision of high risk pregnancy, antepartum 04/07/2018   .Marland Kitchen Nursing Staff Provider Office Location  [facility] Dating  ultrasound Language  English Anatomy US  Limited has follow up in July Flu Vaccine  08-11-18 Genetic Screen  NIPS:low risks   AFP: negative   TDaP vaccine   info given 08-11-18 Hgb A1C or  GTT Early  Third trimester  Rhogam     LAB RESULTS  Feeding Plan Breast/Bottle Blood Type --/--/O POS (09/27 0124) O pos Contraception Tubal ligation A  . Unwanted fertility 08/11/2018    Past Surgical History:  Procedure Laterality Date  . APPENDECTOMY    . HEMORRHOID SURGERY N/A 01/19/2016   Procedure: EXTENSIVE HEMORRHOIDECTOMY;  Surgeon: Aviva Signs, MD;  Location: [facility];  Service: General;  Laterality: N/A;  . kidney stents    . LITHOTRIPSY      Family History  Problem Relation Age of Onset  . Cancer Mother        breast  . Diabetes Maternal Grandmother     Social History   Socioeconomic History  . Marital status: Married    Spouse name: Not on file  . Number of children: Not on file  .  Years of education: Not on file  . Highest education level: Not on file  Occupational History  . Occupation: unemployed  Tobacco Use  . Smoking status: Never Smoker  . Smokeless tobacco: Never Used  Vaping Use  . Vaping Use: Never used  Substance and Sexual Activity  . Alcohol use: No  . Drug use: No    Comment: Hx Cocaine use  . Sexual activity: Not Currently    Birth control/protection: None  Other Topics Concern  . Not on file  Social History Narrative  . Not on file   Social Determinants of Health   Financial Resource Strain: Not on file  Food Insecurity: Not on file  Transportation Needs: Not on file   Physical Activity: Not on file  Stress: Not on file  Social Connections: Not on file  Intimate Partner Violence: Not on file    ROS Review of Systems  Constitutional: Negative.   Respiratory: Negative.   Cardiovascular: Negative.   Musculoskeletal: Negative.   Psychiatric/Behavioral: Negative.     Objective:   Today's Vitals: BP (!) 148/96   Pulse 89   Temp 98.1 F (36.7 C)   Resp 20   Ht _0  (1.6 m)   Wt 149 lb (67.6 kg)   SpO2 98%   BMI 26.39 kg/m   Physical Exam Constitutional:      Appearance: Normal appearance.  Cardiovascular:     Rate and Rhythm: Normal rate and regular rhythm.     Pulses: Normal pulses.     Heart sounds: Normal heart sounds.  Pulmonary:     Effort: Pulmonary effort is normal.     Breath sounds: Normal breath sounds.  Neurological:     Mental Status: She is alert.  Psychiatric:        Mood and Affect: Mood normal.        Behavior: Behavior normal.        Thought Content: Thought content normal.        Judgment: Judgment normal.     Assessment & Plan:   Problem List Items Addressed This Visit      Other   History of cocaine abuse ([place])    -denies current use      Prediabetes    -A1c added      Relevant Orders   Lipid Panel With LDL/HDL Ratio   HLD (hyperlipidemia)    -no recent labs; will check today      Relevant Orders   Lipid Panel With LDL/HDL Ratio   Encounter to establish care    -saw GYN in the past, but last visit was 2019 -otherwise, no PCP      Relevant Orders   Hemoglobin A1c   Lipid Panel With LDL/HDL Ratio   Elevated BP without diagnosis of hypertension    BP Readings from Last 2 Encounters:  03/05/21 (!) 148/96  09/12/20 140/73   -no previous diagnosis of HTN without being pregnant -will check again in 1 week at time of physical       Other Visit Diagnoses    Preop examination    -  Primary   Relevant Orders   DG Chest 2 View   CBC with Differential/Platelet   CMP14+EGFR   INR/PT    PTT   B-HCG Quant   HIV Antibody (routine testing w rflx)      Outpatient Encounter Medications as of 03/05/2021  Medication Sig  . [DISCONTINUED] azithromycin (ZITHROMAX) 250 MG tablet Take 1 tablet (250 mg total) by  mouth daily. Take first 2 tablets together, then 1 every day until finished. (Patient not taking: Reported on 03/05/2021)  . [DISCONTINUED] benzonatate (TESSALON) 100 MG capsule Take 1 capsule (100 mg total) by mouth every 8 (eight) hours. (Patient not taking: Reported on 03/05/2021)  . [DISCONTINUED] cetirizine (ZYRTEC) 10 MG tablet Take 1 tablet (10 mg total) by mouth daily. (Patient not taking: Reported on 03/05/2021)  . [DISCONTINUED] fluticasone (FLONASE) 50 MCG/ACT nasal spray Place 2 sprays into both nostrils daily.  . [DISCONTINUED] ibuprofen (ADVIL) 800 MG tablet Take 1 tablet (800 mg total) by mouth every 8 (eight) hours as needed for moderate pain. (Patient not taking: Reported on 03/05/2021)  . [DISCONTINUED] metoCLOPramide (REGLAN) 10 MG tablet Take 1 tablet (10 mg total) by mouth every 6 (six) hours.  . [DISCONTINUED] ondansetron (ZOFRAN) 4 MG tablet Take 1 tablet (4 mg total) by mouth every 6 (six) hours. (Patient not taking: Reported on 03/05/2021)  . [DISCONTINUED] predniSONE (STERAPRED UNI-PAK 21 TAB) 10 MG (21) TBPK tablet Take by mouth daily. Take 6 tabs by mouth daily  for 2 days, then 5 tabs for 2 days, then 4 tabs for 2 days, then 3 tabs for 2 days, 2 tabs for 2 days, then 1 tab by mouth daily for 2 days (Patient not taking: Reported on 03/05/2021)   No facility-administered encounter medications on file as of 03/05/2021.    Follow-up: Return in about 1 week (around 03/12/2021) for Physical Exam (with PAP).   Noreene Larsson, NP

## 2021-03-05 NOTE — Assessment & Plan Note (Signed)
-   denies current use

## 2021-03-05 NOTE — Assessment & Plan Note (Signed)
-  no recent labs; will check today

## 2021-03-05 NOTE — Addendum Note (Signed)
Addended by: Dellia Cloud on: 03/05/2021 08:55 AM   Modules accepted: Orders

## 2021-03-05 NOTE — Assessment & Plan Note (Signed)
-  saw GYN in the past, but last visit was 2019 -otherwise, no PCP

## 2021-03-05 NOTE — Assessment & Plan Note (Signed)
A1c added ?

## 2021-03-06 LAB — CBC WITH DIFFERENTIAL/PLATELET
Basophils Absolute: 0.1 10*3/uL (ref 0.0–0.2)
Basos: 1 %
EOS (ABSOLUTE): 0.6 10*3/uL — ABNORMAL HIGH (ref 0.0–0.4)
Eos: 11 %
Hematocrit: 34.6 % (ref 34.0–46.6)
Hemoglobin: 10.7 g/dL — ABNORMAL LOW (ref 11.1–15.9)
Immature Grans (Abs): 0 10*3/uL (ref 0.0–0.1)
Immature Granulocytes: 0 %
Lymphocytes Absolute: 1.7 10*3/uL (ref 0.7–3.1)
Lymphs: 31 %
MCH: 24.4 pg — ABNORMAL LOW (ref 26.6–33.0)
MCHC: 30.9 g/dL — ABNORMAL LOW (ref 31.5–35.7)
MCV: 79 fL (ref 79–97)
Monocytes Absolute: 0.3 10*3/uL (ref 0.1–0.9)
Monocytes: 6 %
Neutrophils Absolute: 2.8 10*3/uL (ref 1.4–7.0)
Neutrophils: 51 %
Platelets: 437 10*3/uL (ref 150–450)
RBC: 4.39 x10E6/uL (ref 3.77–5.28)
RDW: 14.1 % (ref 11.7–15.4)
WBC: 5.5 10*3/uL (ref 3.4–10.8)

## 2021-03-06 LAB — CMP14+EGFR
ALT: 20 IU/L (ref 0–32)
AST: 21 IU/L (ref 0–40)
Albumin/Globulin Ratio: 1.3 (ref 1.2–2.2)
Albumin: 4.2 g/dL (ref 3.8–4.8)
Alkaline Phosphatase: 108 IU/L (ref 44–121)
BUN/Creatinine Ratio: 9 (ref 9–23)
BUN: 7 mg/dL (ref 6–24)
Bilirubin Total: <0.2 mg/dL (ref 0.0–1.2)
CO2: 24 mmol/L (ref 20–29)
Calcium: 9 mg/dL (ref 8.7–10.2)
Chloride: 105 mmol/L (ref 96–106)
Creatinine, Ser: 0.74 mg/dL (ref 0.57–1.00)
Globulin, Total: 3.2 g/dL (ref 1.5–4.5)
Glucose: 104 mg/dL — ABNORMAL HIGH (ref 65–99)
Potassium: 3.8 mmol/L (ref 3.5–5.2)
Sodium: 143 mmol/L (ref 134–144)
Total Protein: 7.4 g/dL (ref 6.0–8.5)
eGFR: 104 mL/min/{1.73_m2} (ref >59)

## 2021-03-06 LAB — PROTIME-INR
INR: 0.9 (ref 0.9–1.2)
Prothrombin Time: 9.8 s (ref 9.1–12.0)

## 2021-03-06 LAB — HIV ANTIBODY (ROUTINE TESTING W REFLEX): HIV Screen 4th Generation wRfx: NONREACTIVE

## 2021-03-06 LAB — APTT: aPTT: 25 s (ref 24–33)

## 2021-03-06 LAB — BETA HCG QUANT (REF LAB): hCG Quant: <1 m[IU]/mL

## 2021-03-06 NOTE — Progress Notes (Signed)
Labs look good. Please forward these to her surgeon.

## 2021-03-08 ENCOUNTER — Ambulatory Visit (INDEPENDENT_AMBULATORY_CARE_PROVIDER_SITE_OTHER): Payer: Self-pay | Admitting: Nurse Practitioner

## 2021-03-08 ENCOUNTER — Encounter: Payer: Self-pay | Admitting: Nurse Practitioner

## 2021-03-08 ENCOUNTER — Other Ambulatory Visit: Payer: Self-pay

## 2021-03-08 DIAGNOSIS — Z0000 Encounter for general adult medical examination without abnormal findings: Secondary | ICD-10-CM

## 2021-03-08 NOTE — Progress Notes (Signed)
Established Patient Office Visit  Subjective:  Patient ID: [name], female    DOB: July 29, 1981  Age: 40 y.o. MRN: [id number]  CC:  Chief Complaint  Patient presents with   Annual Exam    HPI [name] presents for physical exam without PAP. No acute concerns. She does have pre-op paperwork for liposuction.  Past Medical History:  Diagnosis Date   Acute left-sided weakness 07/30/2016   Advanced maternal age in multigravida 04/07/2018   Low risks NIPS   Anemia 09/11/2015   Diabetes mellitus without complication (HCC)    gestational diabetes during pregnancy   GERD (gastroesophageal reflux disease)    Gestational diabetes mellitus, class A1 12/20/2015   Nutrition referral made 12/20/2015     Gestational hypertension    Gestational hypertension, third trimester 09/21/2018        Guidelines for Antenatal Testing and Sonography  (with updated ICD-10 codes)  Updated  10/08/17 INDICATION U/S NST/AFI or BPP wkly DELIVERY Pre-eclampsia  GHTN - O13.9/Preeclampsia without severe features  - O14.00   Preeclampsia with severe features - O14.10  Q  3-4wks  Q 2 wks  28//BPP wkly then 32//2 x wk or BPP wkly  Inpatient  37  PRN or 34    History of cocaine use    History of preeclampsia, prior pregnancy, currently pregnant, third trimester 01/09/2014   Arly.Keller ] Aspirin 81 mg daily after 12 weeks; discontinue after 36 weeks Current antihypertensives:  None   Baseline and surveillance labs (pulled in from [facility], refresh links as needed)  Lab Results Component Value Date  PLT 312 04/07/2018  CREATININE 0.69 08/01/2016  AST 24 07/30/2016  ALT 22 07/30/2016  PROTCRRATIO 0.39 (H) 01/10/2016  LABPROT 12.6 07/30/2016   Antenatal Testing CHTN - O10.919  Group   History of preterm delivery, currently pregnant in second trimester 06/30/2018   On 17 P   Kidney stones    Ovarian cyst    Pregnancy induced hypertension    Preterm labor in third trimester without delivery  09/26/2018   Severe hypertension affecting pregnancy, antepartum, third trimester 10/03/2018   Supervision of high risk pregnancy, antepartum 04/07/2018   .Marland Kitchen Nursing Staff Provider Office Location  [facility] Dating  ultrasound Language  English Anatomy US  Limited has follow up in July Flu Vaccine  08-11-18 Genetic Screen  NIPS:low risks   AFP: negative   TDaP vaccine   info given 08-11-18 Hgb A1C or  GTT Early  Third trimester  Rhogam     LAB RESULTS  Feeding Plan Breast/Bottle Blood Type --/--/O POS (09/27 0124) O pos Contraception Tubal ligation A   Unwanted fertility 08/11/2018    Past Surgical History:  Procedure Laterality Date   APPENDECTOMY     HEMORRHOID SURGERY N/A 01/19/2016   Procedure: EXTENSIVE HEMORRHOIDECTOMY;  Surgeon: Franky Macho, MD;  Location: [facility];  Service: General;  Laterality: N/A;   kidney stents     LITHOTRIPSY      Family History  Problem Relation Age of Onset   Cancer Mother        breast   Diabetes Maternal Grandmother     Social History   Socioeconomic History   Marital status: Married    Spouse name: Not on file   Number of children: Not on file   Years of education: Not on file   Highest education level: Not on file  Occupational History   Occupation: unemployed  Tobacco Use   Smoking status: Never Smoker  Smokeless tobacco: Never Used  Vaping Use   Vaping Use: Never used  Substance and Sexual Activity   Alcohol use: No   Drug use: No    Comment: Hx Cocaine use   Sexual activity: Not Currently    Birth control/protection: None  Other Topics Concern   Not on file  Social History Narrative   Not on file   Social Determinants of Health   Financial Resource Strain: Not on file  Food Insecurity: Not on file  Transportation Needs: Not on file  Physical Activity: Not on file  Stress: Not on file  Social Connections: Not on file  Intimate Partner Violence: Not on file    No outpatient medications prior to visit.    No facility-administered medications prior to visit.    No Known Allergies  ROS Review of Systems  Constitutional: Negative.   HENT: Negative.   Eyes: Negative.   Respiratory: Negative.   Cardiovascular: Negative.   Gastrointestinal: Negative.   Endocrine: Negative.   Genitourinary: Negative.   Musculoskeletal: Negative.   Skin: Negative.   Allergic/Immunologic: Negative.   Neurological: Negative.   Hematological: Negative.   Psychiatric/Behavioral: Negative.       Objective:    Physical Exam Constitutional:      Appearance: Normal appearance.  HENT:     Head: Normocephalic and atraumatic.     Right Ear: Tympanic membrane, ear canal and external ear normal.     Left Ear: Tympanic membrane, ear canal and external ear normal.     Nose: Nose normal.     Mouth/Throat:     Mouth: Mucous membranes are moist.     Pharynx: Oropharynx is clear.  Eyes:     Extraocular Movements: Extraocular movements intact.     Conjunctiva/sclera: Conjunctivae normal.     Pupils: Pupils are equal, round, and reactive to light.  Cardiovascular:     Rate and Rhythm: Normal rate and regular rhythm.     Pulses: Normal pulses.     Heart sounds: Normal heart sounds.  Pulmonary:     Effort: Pulmonary effort is normal.     Breath sounds: Normal breath sounds.  Abdominal:     General: Abdomen is flat. Bowel sounds are normal.     Palpations: Abdomen is soft.  Musculoskeletal:        General: Normal range of motion.     Cervical back: Normal range of motion and neck supple.  Skin:    General: Skin is warm and dry.     Capillary Refill: Capillary refill takes less than 2 seconds.  Neurological:     General: No focal deficit present.     Mental Status: She is alert and oriented to person, place, and time.     Cranial Nerves: No cranial nerve deficit.     Sensory: No sensory deficit.     Motor: No weakness.     Coordination: Coordination normal.     Gait: Gait normal.  Psychiatric:         Mood and Affect: Mood normal.        Behavior: Behavior normal.        Thought Content: Thought content normal.        Judgment: Judgment normal.     BP 134/86    Pulse 88    Temp 98.1 F (36.7 C)    Resp 20    Ht 5\' 3"  (1.6 m)    Wt 149 lb (67.6 kg)    SpO2 99%  BMI 26.39 kg/m  Wt Readings from Last 3 Encounters:  03/08/21 149 lb (67.6 kg)  03/05/21 149 lb (67.6 kg)  09/12/20 156 lb (70.8 kg)     Health Maintenance Due  Topic Date Due   Hepatitis C Screening  Never done   HEMOGLOBIN A1C  01/30/2017   COVID-19 Vaccine (3 - Booster for Moderna series) 08/23/2020   PAP SMEAR-Modifier  04/07/2021    There are no preventive care reminders to display for this patient.  Lab Results  Component Value Date   TSH 0.488 07/30/2016   Lab Results  Component Value Date   WBC 5.5 03/05/2021   HGB 10.7 (L) 03/05/2021   HCT 34.6 03/05/2021   MCV 79 03/05/2021   PLT 437 03/05/2021   Lab Results  Component Value Date   NA 143 03/05/2021   K 3.8 03/05/2021   CO2 24 03/05/2021   GLUCOSE 104 (H) 03/05/2021   BUN 7 03/05/2021   CREATININE 0.74 03/05/2021   BILITOT <0.2 03/05/2021   ALKPHOS 108 03/05/2021   AST 21 03/05/2021   ALT 20 03/05/2021   PROT 7.4 03/05/2021   ALBUMIN 4.2 03/05/2021   CALCIUM 9.0 03/05/2021   ANIONGAP 8 07/18/2019   Lab Results  Component Value Date   CHOL 234 (H) 08/01/2016   Lab Results  Component Value Date   HDL 38 (L) 08/01/2016   Lab Results  Component Value Date   LDLCALC 160 (H) 08/01/2016   Lab Results  Component Value Date   TRIG 180 (H) 08/01/2016   Lab Results  Component Value Date   CHOLHDL 6.2 08/01/2016   Lab Results  Component Value Date   HGBA1C 5.8 (H) 07/30/2016      Assessment & Plan:   Problem List Items Addressed This Visit      Other   Preventative health care    -physical today -her glucose was elevated, but she states she was not fasting prior to lab draw         No orders of the  defined types were placed in this encounter.   Follow-up: Return in about 1 year (around 03/08/2022) for Physical Exam.    Heather Roberts, NP

## 2021-03-08 NOTE — Patient Instructions (Signed)
We will meet back up in 1 year for a physical.

## 2021-03-08 NOTE — Assessment & Plan Note (Signed)
-  physical today -her glucose was elevated, but she states she was not fasting prior to lab draw

## 2021-03-19 ENCOUNTER — Other Ambulatory Visit: Payer: Self-pay

## 2021-03-19 ENCOUNTER — Ambulatory Visit: Payer: Self-pay

## 2021-03-19 DIAGNOSIS — E611 Iron deficiency: Secondary | ICD-10-CM

## 2021-03-19 DIAGNOSIS — R7301 Impaired fasting glucose: Secondary | ICD-10-CM

## 2021-03-19 NOTE — Addendum Note (Signed)
Addended by: Abner Greenspan on: 03/19/2021 10:26 AM   Modules accepted: Orders

## 2021-03-20 LAB — HEMOGLOBIN A1C
Est. average glucose Bld gHb Est-mCnc: 114 mg/dL
Hgb A1c MFr Bld: 5.6 % (ref 4.8–5.6)

## 2021-03-20 LAB — CBC
Hematocrit: 35.2 % (ref 34.0–46.6)
Hemoglobin: 10.9 g/dL — ABNORMAL LOW (ref 11.1–15.9)
MCH: 24.6 pg — ABNORMAL LOW (ref 26.6–33.0)
MCHC: 31 g/dL — ABNORMAL LOW (ref 31.5–35.7)
MCV: 80 fL (ref 79–97)
Platelets: 417 10*3/uL (ref 150–450)
RBC: 4.43 x10E6/uL (ref 3.77–5.28)
RDW: 15.7 % — ABNORMAL HIGH (ref 11.7–15.4)
WBC: 7 10*3/uL (ref 3.4–10.8)

## 2021-03-20 NOTE — Progress Notes (Signed)
Hgb is slightly low. I'm adding an iron panel to see if your iron levels are low. If you are having dark and tarry or bloody BMs that can cause anemia (low hemoglobin or blood counts). Usually females get low hgb from menstruation, and your hgb is only 0.1 points low, so we will monitor this with routine labs.

## 2021-06-11 ENCOUNTER — Ambulatory Visit
Admission: EM | Admit: 2021-06-11 | Discharge: 2021-06-11 | Disposition: A | Discharge status: Home / Self Care | Payer: Self-pay | Attending: Family Medicine | Admitting: Family Medicine

## 2021-06-11 ENCOUNTER — Encounter: Payer: Self-pay | Admitting: Emergency Medicine

## 2021-06-11 ENCOUNTER — Other Ambulatory Visit: Payer: Self-pay

## 2021-06-11 DIAGNOSIS — J069 Acute upper respiratory infection, unspecified: Secondary | ICD-10-CM | POA: Insufficient documentation

## 2021-06-11 DIAGNOSIS — R059 Cough, unspecified: Secondary | ICD-10-CM | POA: Insufficient documentation

## 2021-06-11 DIAGNOSIS — J029 Acute pharyngitis, unspecified: Secondary | ICD-10-CM | POA: Insufficient documentation

## 2021-06-11 LAB — POCT RAPID STREP A (OFFICE): Rapid Strep A Screen: NEGATIVE

## 2021-06-11 MED ORDER — BENZONATATE 100 MG PO CAPS: ORAL_CAPSULE | ORAL | 0 refills | Status: AC

## 2021-06-11 NOTE — Discharge Instructions (Addendum)
You have been tested for influenza and COVID-19 today. If your test returns positive, you will receive a phone call from [facility] regarding your results. Negative test results are not called. Both positive and negative results area always visible on MyChart. If you do not have a MyChart account, sign up instructions are provided in your discharge papers. Please do not hesitate to contact us should you have questions or concerns.

## 2021-06-11 NOTE — ED Provider Notes (Signed)
[facility]   [id number] 06/11/21 Arrival Time: 1250  ASSESSMENT & PLAN:  1. Viral URI with cough   2. Cough   3. Sore throat    Rapid strep negative. Culture sent. Discussed typical duration of viral illnesses. COVID-19/influenza testing sent. OTC symptom care as needed. Work note provided.  Meds ordered this encounter  Medications   benzonatate (TESSALON) 100 MG capsule    Sig: Take 1 capsule by mouth every 8 (eight) hours for cough.    Dispense:  21 capsule    Refill:  0     Follow-up Information     Heather Roberts, NP.   Specialty: Nurse Practitioner Why: As needed. Contact information: [address]  [address] [place] [address] [phone]                 Reviewed expectations re: course of current medical issues. Questions answered. Outlined signs and symptoms indicating need for more acute intervention. Understanding verbalized. After Visit Summary given.   SUBJECTIVE: History from: patient. [name] is a 40 y.o. female who reports cough, runny nose, ST; x 4-5 days. Fatigued. Denies: fever and difficulty breathing. Normal PO intake without n/v/d.   OBJECTIVE:  Vitals:   06/11/21 1259  BP: (!) 148/98  Pulse: 90  Resp: 19  Temp: 98.1 F (36.7 C)  TempSrc: Oral  SpO2: 99%    General appearance: alert; no distress; appears fatigued Eyes: PERRLA; EOMI; conjunctiva normal HENT: [place]; AT; with nasal congestion; throat with moderate erythema and tonsil enlargement; no exudates Neck: supple with small cerv LAD Lungs: speaks full sentences without difficulty; unlabored Extremities: no edema Skin: warm and dry Neurologic: normal gait Psychological: alert and cooperative; normal mood and affect  Labs: Results for orders placed or performed during the hospital encounter of 06/11/21  POCT rapid strep A  Result Value Ref Range   Rapid Strep A Screen Negative Negative   Labs Reviewed  COVID-19, FLU A+B  NAA  CULTURE, GROUP A STREP [facility])  POCT RAPID STREP A (OFFICE)    No Known Allergies  Past Medical History:  Diagnosis Date   Acute left-sided weakness 07/30/2016   Advanced maternal age in multigravida 04/07/2018   Low risks NIPS   Anemia 09/11/2015   Diabetes mellitus without complication (HCC)    gestational diabetes during pregnancy   GERD (gastroesophageal reflux disease)    Gestational diabetes mellitus, class A1 12/20/2015   Nutrition referral made 12/20/2015     Gestational hypertension    Gestational hypertension, third trimester 09/21/2018        Guidelines for Antenatal Testing and Sonography  (with updated ICD-10 codes)  Updated  September 22, 2017 INDICATION U/S NST/AFI or BPP wkly DELIVERY Pre-eclampsia  GHTN - O13.9/Preeclampsia without severe features  - O14.00   Preeclampsia with severe features - O14.10  Q  3-4wks  Q 2 wks  28//BPP wkly then 32//2 x wk or BPP wkly  Inpatient  37  PRN or 34    History of cocaine use    History of preeclampsia, prior pregnancy, currently pregnant, third trimester 01/09/2014   Arly.Keller ] Aspirin 81 mg daily after 12 weeks; discontinue after 36 weeks Current antihypertensives:  None   Baseline and surveillance labs (pulled in from [facility], refresh links as needed)  Lab Results Component Value Date  PLT 312 04/07/2018  CREATININE 0.69 08/01/2016  AST 24 07/30/2016  ALT 22 07/30/2016  PROTCRRATIO 0.39 (H) 01/10/2016  LABPROT 12.6 07/30/2016   Antenatal Testing  CHTN - O10.919  Group   History of preterm delivery, currently pregnant in second trimester 06/30/2018   On 17 P   Kidney stones    Ovarian cyst    Pregnancy induced hypertension    Preterm labor in third trimester without delivery 09/26/2018   Severe hypertension affecting pregnancy, antepartum, third trimester 10/03/2018   Supervision of high risk pregnancy, antepartum 04/07/2018   .Marland Kitchen Nursing Staff Provider Office Location  [facility] Dating  ultrasound Language  English Anatomy US  Limited has follow up in  July Flu Vaccine  08-11-18 Genetic Screen  NIPS:low risks   AFP: negative   TDaP vaccine   info given 08-11-18 Hgb A1C or  GTT Early  Third trimester  Rhogam     LAB RESULTS  Feeding Plan Breast/Bottle Blood Type --/--/O POS (09/27 0124) O pos Contraception Tubal ligation A   Unwanted fertility 08/11/2018   Social History   Socioeconomic History   Marital status: Married    Spouse name: Not on file   Number of children: Not on file   Years of education: Not on file   Highest education level: Not on file  Occupational History   Occupation: unemployed  Tobacco Use   Smoking status: Never   Smokeless tobacco: Never  Vaping Use   Vaping Use: Never used  Substance and Sexual Activity   Alcohol use: No   Drug use: No    Comment: Hx Cocaine use   Sexual activity: Not Currently    Birth control/protection: None  Other Topics Concern   Not on file  Social History Narrative   Not on file   Social Determinants of Health   Financial Resource Strain: Not on file  Food Insecurity: Not on file  Transportation Needs: Not on file  Physical Activity: Not on file  Stress: Not on file  Social Connections: Not on file  Intimate Partner Violence: Not on file   Family History  Problem Relation Age of Onset   Cancer Mother        breast   Diabetes Maternal Grandmother    Past Surgical History:  Procedure Laterality Date   APPENDECTOMY     HEMORRHOID SURGERY N/A 01/19/2016   Procedure: EXTENSIVE HEMORRHOIDECTOMY;  Surgeon: Franky Macho, MD;  Location: [facility];  Service: General;  Laterality: N/A;   kidney stents     LITHOTRIPSY       Mardella Layman, MD 06/11/21 1345

## 2021-06-11 NOTE — ED Triage Notes (Signed)
Cough, runny nose and sore throat that started Friday.  Yesterday pt started having a headache ,chest congestion and vomiting.

## 2021-06-12 LAB — COVID-19, FLU A+B NAA
Influenza A, NAA: NOT DETECTED
Influenza B, NAA: NOT DETECTED
SARS-CoV-2, NAA: NOT DETECTED

## 2021-06-14 LAB — CULTURE, GROUP A STREP (THRC)

## 2024-03-24 ENCOUNTER — Other Ambulatory Visit: Payer: Self-pay

## 2024-03-24 ENCOUNTER — Ambulatory Visit
Admission: EM | Admit: 2024-03-24 | Discharge: 2024-03-24 | Disposition: A | Discharge status: Home / Self Care | Attending: Nurse Practitioner | Admitting: Nurse Practitioner

## 2024-03-24 ENCOUNTER — Encounter (HOSPITAL_COMMUNITY): Payer: Self-pay

## 2024-03-24 ENCOUNTER — Ambulatory Visit (HOSPITAL_COMMUNITY)
Admission: RE | Admit: 2024-03-24 | Discharge: 2024-03-24 | Disposition: A | Discharge status: Home / Self Care | Source: Ambulatory Visit | Attending: Nurse Practitioner | Admitting: Nurse Practitioner

## 2024-03-24 DIAGNOSIS — J22 Unspecified acute lower respiratory infection: Secondary | ICD-10-CM

## 2024-03-24 DIAGNOSIS — R059 Cough, unspecified: Secondary | ICD-10-CM

## 2024-03-24 MED ORDER — ALBUTEROL SULFATE HFA 108 (90 BASE) MCG/ACT IN AERS: 2.0000 | INHALATION_SPRAY | Freq: Four times a day (QID) | RESPIRATORY_TRACT | 0 refills | Status: AC | PRN

## 2024-03-24 MED ORDER — PROMETHAZINE-DM 6.25-15 MG/5ML PO SYRP: 5.0000 mL | ORAL_SOLUTION | Freq: Four times a day (QID) | ORAL | 0 refills | Status: AC | PRN

## 2024-03-24 MED ORDER — PREDNISONE 20 MG PO TABS: 40.0000 mg | ORAL_TABLET | Freq: Every day | ORAL | 0 refills | Status: AC

## 2024-03-24 MED ORDER — IPRATROPIUM-ALBUTEROL 0.5-2.5 (3) MG/3ML IN SOLN
3.0000 mL | Freq: Once | RESPIRATORY_TRACT | Status: AC
  Administered 2024-03-24: 3 mL via RESPIRATORY_TRACT

## 2024-03-24 MED ORDER — AZITHROMYCIN 250 MG PO TABS: 250.0000 mg | ORAL_TABLET | Freq: Every day | ORAL | 0 refills | Status: AC

## 2024-03-24 MED ORDER — METHYLPREDNISOLONE SODIUM SUCC 125 MG IJ SOLR
125.0000 mg | Freq: Once | INTRAMUSCULAR | Status: AC
  Administered 2024-03-24: 125 mg via INTRAMUSCULAR

## 2024-03-24 NOTE — ED Notes (Signed)
 Pt reports chest tightness, cough, congestion, difficulty breathing and lightheadedness x 2 weeks.

## 2024-03-24 NOTE — ED Notes (Signed)
 Pt reports some improvement in chest tightness post neb.

## 2024-03-24 NOTE — ED Provider Notes (Signed)
 [facility]    CSN: [id number] Arrival date & time: 03/24/24  1702      History   Chief Complaint No chief complaint on file.   HPI [name] is a 43 y.o. female.   The history is provided by the patient.   Patient presents with a 2-week history of cough, shortness of breath, and wheezing.  She also endorses chest pain that has been occurring since she has been coughing along with chest tightness.  Patient states "I feel like I cannot take in enough air."  Patient states that she had a fever approximately last week.  States that she thought symptoms were initially a cold.  She states that she has not had headache, ear pain, difficulty breathing, abdominal pain, nausea, vomiting, diarrhea, or rash.  States she has been taking over-the-counter medications for her symptoms.  Denies history of smoking, asthma, or COPD.  Past Medical History:  Diagnosis Date   Acute left-sided weakness 07/30/2016   Advanced maternal age in multigravida 04/07/2018   Low risks NIPS   Anemia 09/11/2015   Diabetes mellitus without complication (HCC)    gestational diabetes during pregnancy   GERD (gastroesophageal reflux disease)    Gestational diabetes mellitus, class A1 12/20/2015   Nutrition referral made 12/20/2015     Gestational hypertension    Gestational hypertension, third trimester 09/21/2018        Guidelines for Antenatal Testing and Sonography  (with updated ICD-10 codes)  Updated  16-Sep-2017 INDICATION U/S NST/AFI or BPP wkly DELIVERY Pre-eclampsia  GHTN - O13.9/Preeclampsia without severe features  - O14.00   Preeclampsia with severe features - O14.10  Q  3-4wks  Q 2 wks  28//BPP wkly then 32//2 x wk or BPP wkly  Inpatient  37  PRN or 34    History of cocaine use    History of preeclampsia, prior pregnancy, currently pregnant, third trimester 01/09/2014   Arly.Keller ] Aspirin 81 mg daily after 12 weeks; discontinue after 36 weeks Current antihypertensives:  None   Baseline and  surveillance labs (pulled in from [facility], refresh links as needed)  Lab Results Component Value Date  PLT 312 04/07/2018  CREATININE 0.69 08/01/2016  AST 24 07/30/2016  ALT 22 07/30/2016  PROTCRRATIO 0.39 (H) 01/10/2016  LABPROT 12.6 07/30/2016   Antenatal Testing CHTN - O10.919  Group   History of preterm delivery, currently pregnant in second trimester 06/30/2018   On 17 P   Kidney stones    Ovarian cyst    Pregnancy induced hypertension    Preterm labor in third trimester without delivery 09/26/2018   Severe hypertension affecting pregnancy, antepartum, third trimester 10/03/2018   Supervision of high risk pregnancy, antepartum 04/07/2018   .Marland Kitchen Nursing Staff Provider Office Location  [facility] Dating  ultrasound Language  English Anatomy US  Limited has follow up in July Flu Vaccine  08-11-18 Genetic Screen  NIPS:low risks   AFP: negative   TDaP vaccine   info given 08-11-18 Hgb A1C or  GTT Early  Third trimester  Rhogam     LAB RESULTS  Feeding Plan Breast/Bottle Blood Type --/--/O POS (09/27 0124) O pos Contraception Tubal ligation A   Unwanted fertility 08/11/2018    Patient Active Problem List   Diagnosis Date Noted   Preventative health care 03/05/2021   Elevated BP without diagnosis of hypertension 03/05/2021   HLD (hyperlipidemia)    TIA (transient ischemic attack) 07/30/2016   History of cocaine abuse (HCC) 01/16/2016   Right  ovarian cyst 08/30/2015    Past Surgical History:  Procedure Laterality Date   APPENDECTOMY     HEMORRHOID SURGERY N/A 01/19/2016   Procedure: EXTENSIVE HEMORRHOIDECTOMY;  Surgeon: Franky Macho, MD;  Location: [facility];  Service: General;  Laterality: N/A;   kidney stents     LITHOTRIPSY      OB History     Gravida  6   Para  6   Term  4   Preterm  2   AB      Living  6      SAB      IAB      Ectopic      Multiple  0   Live Births  6            Home Medications    Prior to Admission medications   Medication Sig Start Date End  Date Taking? Authorizing Provider  benzonatate (TESSALON) 100 MG capsule Take 1 capsule by mouth every 8 (eight) hours for cough. 06/11/21   Mardella Layman, MD  fluticasone (FLONASE) 50 MCG/ACT nasal spray Place 2 sprays into both nostrils daily. 01/06/20 07/30/20  Wurst, Lowanda Foster, PA-C  metoCLOPramide (REGLAN) 10 MG tablet Take 1 tablet (10 mg total) by mouth every 6 (six) hours. 07/19/19 01/06/20  Ivery Quale, PA-C    Family History Family History  Problem Relation Age of Onset   Cancer Mother        breast   Diabetes Maternal Grandmother     Social History Social History   Tobacco Use   Smoking status: Never   Smokeless tobacco: Never  Vaping Use   Vaping status: Never Used  Substance Use Topics   Alcohol use: No   Drug use: No    Comment: Hx Cocaine use     Allergies   Patient has no known allergies.   Review of Systems Review of Systems Per HPI  Physical Exam Triage Vital Signs ED Triage Vitals [03/24/24 1709]  Encounter Vitals Group     BP (!) 149/96     Systolic BP Percentile      Diastolic BP Percentile      Pulse Rate 79     Resp 16     Temp 99.8 F (37.7 C)     Temp Source Oral     SpO2      Weight      Height      Head Circumference      Peak Flow      Pain Score      Pain Loc      Pain Education      Exclude from Growth Chart    No data found.  Updated Vital Signs BP (!) 149/96 (BP Location: Right Arm)   Pulse 79   Temp 99.8 F (37.7 C) (Oral)   Resp 16   Visual Acuity Right Eye Distance:   Left Eye Distance:   Bilateral Distance:    Right Eye Near:   Left Eye Near:    Bilateral Near:     Physical Exam Vitals and nursing note reviewed.  Constitutional:      General: She is not in acute distress.    Appearance: Normal appearance.  HENT:     Head: Normocephalic.     Right Ear: Tympanic membrane, ear canal and external ear normal.     Left Ear: Tympanic membrane, ear canal and external ear normal.  Eyes:     Extraocular  Movements: Extraocular movements intact.  Pupils: Pupils are equal, round, and reactive to light.  Cardiovascular:     Rate and Rhythm: Normal rate and regular rhythm.     Pulses: Normal pulses.     Heart sounds: Normal heart sounds.  Pulmonary:     Effort: Pulmonary effort is normal.  Abdominal:     General: Bowel sounds are normal.     Palpations: Abdomen is soft.     Tenderness: There is no abdominal tenderness.  Musculoskeletal:     Cervical back: Normal range of motion.  Lymphadenopathy:     Cervical: Cervical adenopathy present.  Skin:    General: Skin is warm and dry.  Neurological:     General: No focal deficit present.     Mental Status: She is alert and oriented to person, place, and time.  Psychiatric:        Mood and Affect: Mood normal.        Behavior: Behavior normal.      UC Treatments / Results  Labs (all labs ordered are listed, but only abnormal results are displayed) Labs Reviewed - No data to display  EKG   Radiology No results found.  Procedures Procedures (including critical care time)  Medications Ordered in UC Medications  ipratropium-albuterol (DUONEB) 0.5-2.5 (3) MG/3ML nebulizer solution 3 mL (has no administration in time range)  methylPREDNISolone sodium succinate (SOLU-MEDROL) 125 mg/2 mL injection 125 mg (has no administration in time range)    Initial Impression / Assessment and Plan / UC Course  I have reviewed the triage vital signs and the nursing notes.  Pertinent labs & imaging results that were available during my care of the patient were reviewed by me and considered in my medical decision making (see chart for details).     *** Final Clinical Impressions(s) / UC Diagnoses   Final diagnoses:  None   Discharge Instructions   None    ED Prescriptions   None    PDMP not reviewed this encounter.

## 2024-03-24 NOTE — ED Triage Notes (Signed)
 Pt reports chest tightness, cough, congestion, difficulty breathing and lightheadedness x 2 weeks.

## 2024-03-24 NOTE — Discharge Instructions (Addendum)
 You will need to go to [facility] to have an x-ray performed.  Go to the emergency department to register.  Please let them know that you are there for an x-ray only.  You will be contacted when the results of the x-ray are received. You were given an injection of Solu-Medrol 125 mg and a nebulizer treatment today.  Start the prednisone on/3/25. Take medication as prescribed. Increase fluids and allow for plenty of rest. You may take over-the-counter Tylenol or ibuprofen as needed for pain, fever, or general discomfort. Make sure you are drinking plenty of fluids.  Try to drink at least 8-10 8 ounce glasses of water while symptoms persist. Recommend use of a humidifier in your bedroom at nighttime during sleep and sleeping elevated on pillows while cough symptoms persist. Go to the emergency department immediately if you experience worsening shortness of breath, wheezing, difficulty breathing, or other concerns. Recommend follow-up with your primary care physician within the next 7 to 10 days for reevaluation. Follow-up as needed.

## 2024-10-01 ENCOUNTER — Emergency Department (HOSPITAL_COMMUNITY)

## 2024-10-01 ENCOUNTER — Emergency Department (HOSPITAL_COMMUNITY)
Admission: EM | Admit: 2024-10-01 | Discharge: 2024-10-01 | Disposition: A | Discharge status: Home / Self Care | Attending: Emergency Medicine | Admitting: Emergency Medicine

## 2024-10-01 ENCOUNTER — Other Ambulatory Visit: Payer: Self-pay

## 2024-10-01 ENCOUNTER — Encounter (HOSPITAL_COMMUNITY): Payer: Self-pay

## 2024-10-01 DIAGNOSIS — J189 Pneumonia, unspecified organism: Secondary | ICD-10-CM | POA: Insufficient documentation

## 2024-10-01 DIAGNOSIS — R0602 Shortness of breath: Secondary | ICD-10-CM | POA: Diagnosis present

## 2024-10-01 DIAGNOSIS — R112 Nausea with vomiting, unspecified: Secondary | ICD-10-CM | POA: Diagnosis not present

## 2024-10-01 LAB — CBC WITH DIFFERENTIAL/PLATELET
Abs Immature Granulocytes: 0.06 K/uL (ref 0.00–0.07)
Basophils Absolute: 0.1 K/uL (ref 0.0–0.1)
Basophils Relative: 1 %
Eosinophils Absolute: 0.6 K/uL — ABNORMAL HIGH (ref 0.0–0.5)
Eosinophils Relative: 6 %
HCT: 35.9 % — ABNORMAL LOW (ref 36.0–46.0)
Hemoglobin: 10.5 g/dL — ABNORMAL LOW (ref 12.0–15.0)
Immature Granulocytes: 1 %
Lymphocytes Relative: 24 %
Lymphs Abs: 2.3 K/uL (ref 0.7–4.0)
MCH: 20.8 pg — ABNORMAL LOW (ref 26.0–34.0)
MCHC: 29.2 g/dL — ABNORMAL LOW (ref 30.0–36.0)
MCV: 71.2 fL — ABNORMAL LOW (ref 80.0–100.0)
Monocytes Absolute: 0.5 K/uL (ref 0.1–1.0)
Monocytes Relative: 5 %
Neutro Abs: 6.1 K/uL (ref 1.7–7.7)
Neutrophils Relative %: 63 %
Platelets: 475 K/uL — ABNORMAL HIGH (ref 150–400)
RBC: 5.04 MIL/uL (ref 3.87–5.11)
RDW: 16.7 % — ABNORMAL HIGH (ref 11.5–15.5)
WBC: 9.6 K/uL (ref 4.0–10.5)
nRBC: 0 % (ref 0.0–0.2)

## 2024-10-01 LAB — LIPASE, BLOOD: Lipase: 42 U/L (ref 11–51)

## 2024-10-01 LAB — COMPREHENSIVE METABOLIC PANEL WITH GFR
ALT: 7 U/L (ref 0–44)
AST: 19 U/L (ref 15–41)
Albumin: 3.9 g/dL (ref 3.5–5.0)
Alkaline Phosphatase: 85 U/L (ref 38–126)
Anion gap: 16 — ABNORMAL HIGH (ref 5–15)
BUN: 10 mg/dL (ref 6–20)
CO2: 22 mmol/L (ref 22–32)
Calcium: 9.2 mg/dL (ref 8.9–10.3)
Chloride: 99 mmol/L (ref 98–111)
Creatinine, Ser: 0.53 mg/dL (ref 0.44–1.00)
GFR, Estimated: >60 mL/min (ref >60)
Glucose, Bld: 149 mg/dL — ABNORMAL HIGH (ref 70–99)
Potassium: 3.5 mmol/L (ref 3.5–5.1)
Sodium: 136 mmol/L (ref 135–145)
Total Bilirubin: 0.4 mg/dL (ref 0.0–1.2)
Total Protein: 9.6 g/dL — ABNORMAL HIGH (ref 6.5–8.1)

## 2024-10-01 LAB — D-DIMER, QUANTITATIVE: D-Dimer, Quant: 1.22 ug{FEU}/mL — ABNORMAL HIGH (ref 0.00–0.50)

## 2024-10-01 LAB — PRO BRAIN NATRIURETIC PEPTIDE: Pro Brain Natriuretic Peptide: 299 pg/mL (ref <300.0)

## 2024-10-01 LAB — TROPONIN T, HIGH SENSITIVITY
Troponin T High Sensitivity: <15 ng/L (ref 0–19)
Troponin T High Sensitivity: <15 ng/L (ref 0–19)

## 2024-10-01 MED ORDER — ONDANSETRON HCL 4 MG/2ML IJ SOLN
4.0000 mg | Freq: Once | INTRAMUSCULAR | Status: AC
  Administered 2024-10-01: 4 mg via INTRAVENOUS
  Filled 2024-10-01: qty 2

## 2024-10-01 MED ORDER — IOHEXOL 350 MG/ML SOLN
75.0000 mL | Freq: Once | INTRAVENOUS | Status: AC | PRN
Start: 2024-10-01 — End: 2024-10-01
  Administered 2024-10-01: 75 mL via INTRAVENOUS

## 2024-10-01 MED ORDER — ALUM & MAG HYDROXIDE-SIMETH 200-200-20 MG/5ML PO SUSP
30.0000 mL | Freq: Once | ORAL | Status: DC
  Filled 2024-10-01: qty 30

## 2024-10-01 MED ORDER — HYDROCODONE BIT-HOMATROP MBR 5-1.5 MG/5ML PO SOLN
5.0000 mL | Freq: Once | ORAL | Status: AC
  Administered 2024-10-01: 5 mL via ORAL
  Filled 2024-10-01: qty 5

## 2024-10-01 MED ORDER — OXYCODONE-ACETAMINOPHEN 5-325 MG PO TABS: 2.0000 | ORAL_TABLET | ORAL | 0 refills | Status: AC | PRN

## 2024-10-01 MED ORDER — FENTANYL CITRATE (PF) 100 MCG/2ML IJ SOLN
50.0000 ug | Freq: Once | INTRAMUSCULAR | Status: AC
  Administered 2024-10-01: 50 ug via INTRAVENOUS
  Filled 2024-10-01: qty 2

## 2024-10-01 MED ORDER — DOXYCYCLINE HYCLATE 100 MG PO TABS
100.0000 mg | ORAL_TABLET | Freq: Once | ORAL | Status: AC
  Administered 2024-10-01: 100 mg via ORAL
  Filled 2024-10-01: qty 1

## 2024-10-01 MED ORDER — PANTOPRAZOLE SODIUM 40 MG IV SOLR
40.0000 mg | Freq: Once | INTRAVENOUS | Status: AC
  Administered 2024-10-01: 40 mg via INTRAVENOUS
  Filled 2024-10-01: qty 10

## 2024-10-01 MED ORDER — ONDANSETRON 4 MG PO TBDP: 4.0000 mg | ORAL_TABLET | Freq: Three times a day (TID) | ORAL | 0 refills | Status: AC | PRN

## 2024-10-01 MED ORDER — LIDOCAINE VISCOUS HCL 2 % MT SOLN
15.0000 mL | Freq: Once | OROMUCOSAL | Status: DC
  Filled 2024-10-01: qty 15

## 2024-10-01 MED ORDER — FAMOTIDINE 20 MG PO TABS
20.0000 mg | ORAL_TABLET | Freq: Once | ORAL | Status: AC
  Administered 2024-10-01: 20 mg via ORAL
  Filled 2024-10-01: qty 1

## 2024-10-01 MED ORDER — DOXYCYCLINE HYCLATE 100 MG PO CAPS: 100.0000 mg | ORAL_CAPSULE | Freq: Two times a day (BID) | ORAL | 0 refills | Status: AC

## 2024-10-01 MED ORDER — HYDROMORPHONE HCL 1 MG/ML IJ SOLN
1.0000 mg | Freq: Once | INTRAMUSCULAR | Status: AC
  Administered 2024-10-01: 1 mg via INTRAVENOUS
  Filled 2024-10-01: qty 1

## 2024-10-01 NOTE — ED Provider Notes (Signed)
 [place] EMERGENCY DEPARTMENT AT [facility] Provider Note   CSN: [id number] Arrival date & time: 10/01/24  0204     Patient presents with: Shortness of Breath and Emesis   [name] is a 43 y.o. female.   The patient is presenting with severe chest pain that began yesterday while lying down. The pain is described as intense and is associated with difficulty breathing. The patient denies any previous episodes of similar symptoms and has no known medical history or daily medication use. There are no associated fevers, recent illnesses, or gastrointestinal symptoms beyond vomiting, which the patient describes as containing only stomach contents. The patient denies alcohol, drug, and tobacco use. The history was obtained from the patient and their mother.   Shortness of Breath Associated symptoms: vomiting   Emesis      Prior to Admission medications   Medication Sig Start Date End Date Taking? Authorizing Provider  doxycycline  (VIBRAMYCIN ) 100 MG capsule Take 1 capsule (100 mg total) by mouth 2 (two) times daily. One po bid x 7 days 10/01/24  Yes Aibhlinn Kalmar, Selinda, MD  ondansetron  (ZOFRAN -ODT) 4 MG disintegrating tablet Take 1 tablet (4 mg total) by mouth every 8 (eight) hours as needed for vomiting. 10/01/24  Yes Aldahir Litaker, Selinda, MD  oxyCODONE -acetaminophen  (PERCOCET) 5-325 MG tablet Take 2 tablets by mouth every 4 (four) hours as needed. 10/01/24  Yes Brielyn Bosak, Selinda, MD  albuterol  (VENTOLIN  HFA) 108 (90 Base) MCG/ACT inhaler Inhale 2 puffs into the lungs every 6 (six) hours as needed. 03/24/24   Leath-Warren, Etta PARAS, NP  azithromycin  (ZITHROMAX ) 250 MG tablet Take 1 tablet (250 mg total) by mouth daily. Take first 2 tablets together, then 1 every day until finished. 03/24/24   Leath-Warren, Etta PARAS, NP  benzonatate  (TESSALON ) 100 MG capsule Take 1 capsule by mouth every 8 (eight) hours for cough. 06/11/21   Rolinda Rogue, MD  promethazine -dextromethorphan  (PROMETHAZINE -DM) 6.25-15 MG/5ML syrup Take 5 mLs by mouth 4 (four) times daily as needed. 03/24/24   Leath-Warren, Etta PARAS, NP  fluticasone  (FLONASE ) 50 MCG/ACT nasal spray Place 2 sprays into both nostrils daily. 01/06/20 07/30/20  Wurst, Laymon, PA-C  metoCLOPramide  (REGLAN ) 10 MG tablet Take 1 tablet (10 mg total) by mouth every 6 (six) hours. 07/19/19 01/06/20  Armida Culver, PA-C    Allergies: Patient has no known allergies.    Review of Systems  Respiratory:  Positive for shortness of breath.   Gastrointestinal:  Positive for vomiting.    Updated Vital Signs BP (!) 163/77   Pulse 74   Temp 97.9 F (36.6 C)   Resp 18   Ht 5' 3 (1.6 m)   Wt 63.5 kg   SpO2 99%   BMI 24.80 kg/m   Physical Exam Vitals and nursing note reviewed.  Constitutional:      Appearance: She is well-developed.  HENT:     Head: Normocephalic and atraumatic.  Cardiovascular:     Rate and Rhythm: Normal rate and regular rhythm.  Pulmonary:     Effort: Tachypnea present. No respiratory distress.     Breath sounds: No stridor.  Abdominal:     General: There is no distension.  Musculoskeletal:     Cervical back: Normal range of motion.  Neurological:     Mental Status: She is alert.     (all labs ordered are listed, but only abnormal results are displayed) Labs Reviewed  CBC WITH DIFFERENTIAL/PLATELET - Abnormal; Notable for the following components:  Result Value   Hemoglobin 10.5 (*)    HCT 35.9 (*)    MCV 71.2 (*)    MCH 20.8 (*)    MCHC 29.2 (*)    RDW 16.7 (*)    Platelets 475 (*)    Eosinophils Absolute 0.6 (*)    All other components within normal limits  COMPREHENSIVE METABOLIC PANEL WITH GFR - Abnormal; Notable for the following components:   Glucose, Bld 149 (*)    Total Protein 9.6 (*)    Anion gap 16 (*)    All other components within normal limits  D-DIMER, QUANTITATIVE - Abnormal; Notable for the following components:   D-Dimer, Quant 1.22 (*)    All other  components within normal limits  LIPASE, BLOOD  PRO BRAIN NATRIURETIC PEPTIDE  URINALYSIS, ROUTINE W REFLEX MICROSCOPIC  PREGNANCY, URINE  URINE DRUG SCREEN  TROPONIN T, HIGH SENSITIVITY  TROPONIN T, HIGH SENSITIVITY    EKG: EKG Interpretation Date/Time:  Friday October 01 2024 02:11:46 EDT Ventricular Rate:  90 PR Interval:  140 QRS Duration:  80 QT Interval:  404 QTC Calculation: 495 R Axis:   -55  Text Interpretation: Sinus rhythm Probable left atrial enlargement Left anterior fascicular block Borderline prolonged QT interval Confirmed by Lorette Mayo [phone]) on 10/01/2024 2:47:07 AM  Radiology: CT Angio Chest PE W and/or Wo Contrast Result Date: 10/01/2024 EXAM: CTA of the Chest with contrast for PE 10/01/2024 03:43:55 AM TECHNIQUE: CTA of the chest was performed without and with the administration of 75 mL of iohexol  (OMNIPAQUE ) 350 MG/ML intravenous contrast. Multiplanar reformatted images are provided for review. MIP images are provided for review. Automated exposure control, iterative reconstruction, and/or weight based adjustment of the mA/kV was utilized to reduce the radiation dose to as low as reasonably achievable. COMPARISON: Chest radiographs 03/05/2021 CTA chest 07/30/2016. CLINICAL HISTORY: 43 year old female. Pulmonary embolism (PE) suspected, high prob. Cc of SOB/CP and emesis since yesterday. FINDINGS: PULMONARY ARTERIES: Good pulmonary artery contrast timing. No pulmonary artery filling defect. Main pulmonary artery is normal in caliber. MEDIASTINUM: The heart and pericardium demonstrate no acute abnormality. Little contrast in the aorta. LYMPH NODES: No mediastinal, hilar or axillary lymphadenopathy. LUNGS AND PLEURA: Central airways are patent but posterior basal segment left lower lobe airways are partially opacified as are right upper lobe airways (series 6 image 33) with mild generalized airway thickening. Treated but nodular opacity in both lower lobes and  scattered peribronchial confluent and subsolid opacity in both lower lobes and the right middle lobe additional lingula and right middle lobe atelectasis. No pleural effusion or pneumothorax. No consolidation at this time. UPPER ABDOMEN: Small gastrocaudal hernia or phrenic ampulla. SOFT TISSUES AND BONES: No acute bone or soft tissue abnormality. IMPRESSION: 1. Negative for pulmonary embolism. 2. Positive for multifocal bilateral airway secretions, suspected generalized airway thickening, and multilobar abnormal peribronchial and tree-in-bud lung opacity. The constellation is compatible with bilateral acute airway infection and developing multifocal bronchopneumonia. No pleural effusion or consolidation at this time. Electronically signed by: Helayne Hurst MD 10/01/2024 03:59 AM EDT RP Workstation: [id number]     Procedures   Medications Ordered in the ED  alum & mag hydroxide-simeth (MAALOX/MYLANTA) 200-200-20 MG/5ML suspension 30 mL (0 mLs Oral Hold 10/01/24 0234)    And  lidocaine  (XYLOCAINE ) 2 % viscous mouth solution 15 mL (0 mLs Oral Hold 10/01/24 0234)  pantoprazole  (PROTONIX ) injection 40 mg (40 mg Intravenous Given 10/01/24 0234)  ondansetron  (ZOFRAN ) injection 4 mg (4 mg Intravenous Given 10/01/24  0234)  fentaNYL  (SUBLIMAZE ) injection 50 mcg (50 mcg Intravenous Given 10/01/24 0234)  HYDROmorphone  (DILAUDID ) injection 1 mg (1 mg Intravenous Given 10/01/24 0320)  iohexol  (OMNIPAQUE ) 350 MG/ML injection 75 mL (75 mLs Intravenous Contrast Given 10/01/24 0333)  doxycycline  (VIBRA -TABS) tablet 100 mg (100 mg Oral Given 10/01/24 0416)  HYDROcodone  bit-homatropine (HYCODAN) 5-1.5 MG/5ML syrup 5 mL (5 mLs Oral Given 10/01/24 0417)  HYDROmorphone  (DILAUDID ) injection 1 mg (1 mg Intravenous Given 10/01/24 0524)  famotidine  (PEPCID ) tablet 20 mg (20 mg Oral Given 10/01/24 0523)                                    Medical Decision Making Amount and/or Complexity of Data Reviewed Labs:  ordered. Radiology: ordered.  Risk OTC drugs. Prescription drug management.   Overall patient is improving.  D-dimer was elevated so CT scan done showing concern for poss multifocal pneumonia so treated for same.  Troponins negative, EKG reassuring doubt ACS.  Patient was unable to provide a urine sample however think it is unlikely that she is pregnant.  Antibiotics and nausea meds sent to the pharmacy. Final diagnoses:  Nausea and vomiting, unspecified vomiting type  Community acquired pneumonia, unspecified laterality    ED Discharge Orders          Ordered    oxyCODONE -acetaminophen  (PERCOCET) 5-325 MG tablet  Every 4 hours PRN        10/01/24 0614    doxycycline  (VIBRAMYCIN ) 100 MG capsule  2 times daily        10/01/24 0614    ondansetron  (ZOFRAN -ODT) 4 MG disintegrating tablet  Every 8 hours PRN        10/01/24 0631               Antoino Westhoff, Selinda, MD 10/01/24 [phone]

## 2024-10-01 NOTE — ED Notes (Signed)
 Patient removed all monitoring cords and was instructed about the importance of keeping them on for monitoring. Patient states she can't right now because she hurts too bad. Patients IV was also found on bedside floor.

## 2024-10-01 NOTE — ED Triage Notes (Signed)
 Pov from home. Cc of SOB/CP and emesis since yesterday.

## 2024-10-04 MED FILL — Oxycodone w/ Acetaminophen Tab 5-325 MG: ORAL | Qty: 6 | Status: AC
